# Patient Record
Sex: Female | Born: 1940
Health system: Southern US, Community
[De-identification: ages and names within clinical notes are randomized; demographics above are authoritative.]

## PROBLEM LIST (undated history)

## (undated) DIAGNOSIS — J449 Chronic obstructive pulmonary disease, unspecified: Secondary | ICD-10-CM

## (undated) DIAGNOSIS — C801 Malignant (primary) neoplasm, unspecified: Secondary | ICD-10-CM

## (undated) DIAGNOSIS — R0602 Shortness of breath: Secondary | ICD-10-CM

## (undated) DIAGNOSIS — D649 Anemia, unspecified: Secondary | ICD-10-CM

## (undated) DIAGNOSIS — K219 Gastro-esophageal reflux disease without esophagitis: Secondary | ICD-10-CM

## (undated) DIAGNOSIS — C187 Malignant neoplasm of sigmoid colon: Secondary | ICD-10-CM

## (undated) DIAGNOSIS — E871 Hypo-osmolality and hyponatremia: Secondary | ICD-10-CM

## (undated) DIAGNOSIS — G4730 Sleep apnea, unspecified: Secondary | ICD-10-CM

## (undated) DIAGNOSIS — E039 Hypothyroidism, unspecified: Secondary | ICD-10-CM

## (undated) DIAGNOSIS — C3481 Malignant neoplasm of overlapping sites of right bronchus and lung: Secondary | ICD-10-CM

## (undated) HISTORY — DX: Anemia, unspecified: D64.9

## (undated) HISTORY — DX: Malignant neoplasm of sigmoid colon: C18.7

## (undated) HISTORY — PX: COLON SURGERY: SHX602

## (undated) HISTORY — DX: Hypo-osmolality and hyponatremia: E87.1

## (undated) HISTORY — DX: Malignant neoplasm of overlapping sites of right bronchus and lung: C34.81

## (undated) MED FILL — Etoposide Inj 1 GM/50ML (20 MG/ML): INTRAVENOUS | Qty: 8 | Status: AC

## (undated) MED FILL — Calcium Gluconate Inj 10%: INTRAVENOUS | Qty: 20 | Status: AC

## (undated) MED FILL — Dexamethasone Sodium Phosphate Inj 100 MG/10ML: INTRAMUSCULAR | Qty: 1 | Status: AC

## (undated) MED FILL — Famotidine in NaCl 0.9% IV Soln 20 MG/50ML: INTRAVENOUS | Qty: 100 | Status: AC

## (undated) MED FILL — Nivolumab IV Soln 100 MG/10ML: INTRAVENOUS | Qty: 24 | Status: AC

---

## 2006-08-26 ENCOUNTER — Other Ambulatory Visit: Admission: RE | Admit: 2006-08-26 | Discharge: 2006-08-26 | Payer: Self-pay | Admitting: *Deleted

## 2008-03-25 HISTORY — PX: PORTACATH PLACEMENT: SHX2246

## 2009-01-23 DIAGNOSIS — C801 Malignant (primary) neoplasm, unspecified: Secondary | ICD-10-CM

## 2009-01-23 HISTORY — DX: Malignant (primary) neoplasm, unspecified: C80.1

## 2010-12-03 ENCOUNTER — Telehealth: Payer: Self-pay

## 2010-12-03 DIAGNOSIS — K639 Disease of intestine, unspecified: Secondary | ICD-10-CM

## 2010-12-03 NOTE — Telephone Encounter (Signed)
Left message on machine to call back   Need pharmacy

## 2010-12-03 NOTE — Telephone Encounter (Signed)
Pt scheduled for EUS need to instruct pt and review meds

## 2010-12-04 NOTE — Telephone Encounter (Signed)
I put it on your desk

## 2010-12-04 NOTE — Telephone Encounter (Signed)
Pt was scheduled for 01/10/11 and previsit on 01/03/11. Letter sent

## 2010-12-04 NOTE — Telephone Encounter (Signed)
Pt was called and she states she was told that the EUS was suppose to be done in a month or two.  Do you want me to reschedule?

## 2010-12-04 NOTE — Telephone Encounter (Signed)
Just looked.  Yes, he wanted it later in October (not sure why but that is ok).  It is back in my out basket.  thanks

## 2010-12-04 NOTE — Telephone Encounter (Signed)
Can I see the paperwork again, I may have missed that.

## 2010-12-06 ENCOUNTER — Encounter: Payer: Self-pay | Admitting: Gastroenterology

## 2010-12-06 ENCOUNTER — Ambulatory Visit (HOSPITAL_COMMUNITY): Admission: RE | Admit: 2010-12-06 | Payer: Medicare Other | Source: Ambulatory Visit | Admitting: Gastroenterology

## 2011-01-03 ENCOUNTER — Telehealth: Payer: Self-pay | Admitting: *Deleted

## 2011-01-03 NOTE — Telephone Encounter (Signed)
Pt No show for Previsit.  Called pt x 2; no answer.  Left message for pt to call back to reschedule PV with Gabrielle Hudson CMA. Ezra Sites

## 2011-01-04 NOTE — Telephone Encounter (Signed)
Left message on machine to call back  

## 2011-01-09 NOTE — Telephone Encounter (Signed)
pt appt for colon tomorrow 01/10/11  Left message on machine to call back pt no showed previsit appt.

## 2011-01-10 ENCOUNTER — Encounter: Payer: Self-pay | Admitting: Gastroenterology

## 2011-01-10 ENCOUNTER — Ambulatory Visit (HOSPITAL_COMMUNITY): Admission: RE | Admit: 2011-01-10 | Payer: Medicare Other | Source: Ambulatory Visit | Admitting: Gastroenterology

## 2011-01-10 ENCOUNTER — Telehealth: Payer: Self-pay | Admitting: Gastroenterology

## 2011-01-10 NOTE — Telephone Encounter (Signed)
Gabrielle Hudson,  She did not show for todays EUS. Can you please let the referring provider know ([facility]). Also see if she wants to reschedule for future date  thanks

## 2011-01-10 NOTE — Telephone Encounter (Signed)
Left message on machine to call back  

## 2011-01-16 NOTE — Telephone Encounter (Signed)
Left message on machine to call back  Unable to reach pt several messages left Dr Jennye Boroughs is aware.

## 2011-03-25 ENCOUNTER — Inpatient Hospital Stay (HOSPITAL_COMMUNITY): Payer: Medicare Other

## 2011-03-25 ENCOUNTER — Other Ambulatory Visit: Payer: Self-pay

## 2011-03-25 ENCOUNTER — Inpatient Hospital Stay (HOSPITAL_COMMUNITY)
Admission: AD | Admit: 2011-03-25 | Discharge: 2011-04-01 | DRG: 682 | Disposition: A | Discharge status: Home / Self Care | Payer: Medicare Other | Source: Other Acute Inpatient Hospital | Attending: Internal Medicine | Admitting: Internal Medicine

## 2011-03-25 ENCOUNTER — Encounter (HOSPITAL_COMMUNITY): Payer: Self-pay | Admitting: Infectious Diseases

## 2011-03-25 DIAGNOSIS — E039 Hypothyroidism, unspecified: Secondary | ICD-10-CM | POA: Diagnosis present

## 2011-03-25 DIAGNOSIS — Z85038 Personal history of other malignant neoplasm of large intestine: Secondary | ICD-10-CM

## 2011-03-25 DIAGNOSIS — I129 Hypertensive chronic kidney disease with stage 1 through stage 4 chronic kidney disease, or unspecified chronic kidney disease: Secondary | ICD-10-CM | POA: Diagnosis present

## 2011-03-25 DIAGNOSIS — J811 Chronic pulmonary edema: Secondary | ICD-10-CM | POA: Diagnosis present

## 2011-03-25 DIAGNOSIS — I4729 Other ventricular tachycardia: Secondary | ICD-10-CM | POA: Diagnosis present

## 2011-03-25 DIAGNOSIS — R7402 Elevation of levels of lactic acid dehydrogenase (LDH): Secondary | ICD-10-CM | POA: Diagnosis present

## 2011-03-25 DIAGNOSIS — N179 Acute kidney failure, unspecified: Secondary | ICD-10-CM | POA: Diagnosis present

## 2011-03-25 DIAGNOSIS — E872 Acidosis, unspecified: Secondary | ICD-10-CM | POA: Diagnosis present

## 2011-03-25 DIAGNOSIS — D72829 Elevated white blood cell count, unspecified: Secondary | ICD-10-CM | POA: Diagnosis present

## 2011-03-25 DIAGNOSIS — J189 Pneumonia, unspecified organism: Secondary | ICD-10-CM | POA: Diagnosis present

## 2011-03-25 DIAGNOSIS — D649 Anemia, unspecified: Secondary | ICD-10-CM | POA: Diagnosis present

## 2011-03-25 DIAGNOSIS — Z23 Encounter for immunization: Secondary | ICD-10-CM

## 2011-03-25 DIAGNOSIS — I472 Ventricular tachycardia, unspecified: Secondary | ICD-10-CM | POA: Diagnosis not present

## 2011-03-25 DIAGNOSIS — R918 Other nonspecific abnormal finding of lung field: Secondary | ICD-10-CM | POA: Diagnosis present

## 2011-03-25 DIAGNOSIS — J9620 Acute and chronic respiratory failure, unspecified whether with hypoxia or hypercapnia: Secondary | ICD-10-CM | POA: Diagnosis not present

## 2011-03-25 DIAGNOSIS — Z85118 Personal history of other malignant neoplasm of bronchus and lung: Secondary | ICD-10-CM

## 2011-03-25 DIAGNOSIS — J441 Chronic obstructive pulmonary disease with (acute) exacerbation: Secondary | ICD-10-CM | POA: Diagnosis present

## 2011-03-25 DIAGNOSIS — G4733 Obstructive sleep apnea (adult) (pediatric): Secondary | ICD-10-CM | POA: Diagnosis present

## 2011-03-25 DIAGNOSIS — R7401 Elevation of levels of liver transaminase levels: Secondary | ICD-10-CM | POA: Diagnosis present

## 2011-03-25 DIAGNOSIS — N183 Chronic kidney disease, stage 3 unspecified: Secondary | ICD-10-CM | POA: Diagnosis present

## 2011-03-25 DIAGNOSIS — I214 Non-ST elevation (NSTEMI) myocardial infarction: Secondary | ICD-10-CM | POA: Diagnosis present

## 2011-03-25 HISTORY — DX: Gastro-esophageal reflux disease without esophagitis: K21.9

## 2011-03-25 HISTORY — DX: Chronic obstructive pulmonary disease, unspecified: J44.9

## 2011-03-25 HISTORY — DX: Hypothyroidism, unspecified: E03.9

## 2011-03-25 HISTORY — DX: Shortness of breath: R06.02

## 2011-03-25 HISTORY — DX: Sleep apnea, unspecified: G47.30

## 2011-03-25 HISTORY — DX: Malignant (primary) neoplasm, unspecified: C80.1

## 2011-03-25 LAB — GLUCOSE, CAPILLARY: Glucose-Capillary: 147 mg/dL — ABNORMAL HIGH (ref 70–99)

## 2011-03-25 LAB — CBC
HCT: 29.9 % — ABNORMAL LOW (ref 36.0–46.0)
Hemoglobin: 9.6 g/dL — ABNORMAL LOW (ref 12.0–15.0)
MCH: 27.4 pg (ref 26.0–34.0)
MCHC: 32.1 g/dL (ref 30.0–36.0)
MCV: 85.2 fL (ref 78.0–100.0)
Platelets: 145 10*3/uL — ABNORMAL LOW (ref 150–400)
RBC: 3.51 MIL/uL — ABNORMAL LOW (ref 3.87–5.11)
RDW: 15.8 % — ABNORMAL HIGH (ref 11.5–15.5)
WBC: 11.2 10*3/uL — ABNORMAL HIGH (ref 4.0–10.5)

## 2011-03-25 LAB — URINALYSIS, ROUTINE W REFLEX MICROSCOPIC
Bilirubin Urine: NEGATIVE
Glucose, UA: NEGATIVE mg/dL
Ketones, ur: NEGATIVE mg/dL
Leukocytes, UA: NEGATIVE
Nitrite: NEGATIVE
Protein, ur: NEGATIVE mg/dL
Specific Gravity, Urine: 1.009 (ref 1.005–1.030)
Urobilinogen, UA: 0.2 mg/dL (ref 0.0–1.0)
pH: 5 (ref 5.0–8.0)

## 2011-03-25 LAB — COMPREHENSIVE METABOLIC PANEL
ALT: 54 U/L — ABNORMAL HIGH (ref 0–35)
AST: 64 U/L — ABNORMAL HIGH (ref 0–37)
Albumin: 2.1 g/dL — ABNORMAL LOW (ref 3.5–5.2)
Alkaline Phosphatase: 62 U/L (ref 39–117)
BUN: 112 mg/dL — ABNORMAL HIGH (ref 6–23)
CO2: 16 mEq/L — ABNORMAL LOW (ref 19–32)
Calcium: 7.3 mg/dL — ABNORMAL LOW (ref 8.4–10.5)
Chloride: 104 mEq/L (ref 96–112)
Creatinine, Ser: 6.49 mg/dL — ABNORMAL HIGH (ref 0.50–1.10)
GFR calc Af Amer: 7 mL/min — ABNORMAL LOW (ref >90)
GFR calc non Af Amer: 6 mL/min — ABNORMAL LOW (ref >90)
Glucose, Bld: 149 mg/dL — ABNORMAL HIGH (ref 70–99)
Potassium: 4.7 mEq/L (ref 3.5–5.1)
Sodium: 135 mEq/L (ref 135–145)
Total Bilirubin: 0.2 mg/dL — ABNORMAL LOW (ref 0.3–1.2)
Total Protein: 5.2 g/dL — ABNORMAL LOW (ref 6.0–8.3)

## 2011-03-25 LAB — URINE MICROSCOPIC-ADD ON

## 2011-03-25 LAB — POCT I-STAT 3, ART BLOOD GAS (G3+)
Acid-base deficit: 11 mmol/L — ABNORMAL HIGH (ref 0.0–2.0)
Bicarbonate: 13.9 mEq/L — ABNORMAL LOW (ref 20.0–24.0)
O2 Saturation: 96 %
Patient temperature: 37
TCO2: 15 mmol/L (ref 0–100)
pCO2 arterial: 26.8 mmHg — ABNORMAL LOW (ref 35.0–45.0)
pH, Arterial: 7.323 — ABNORMAL LOW (ref 7.350–7.400)
pO2, Arterial: 88 mmHg (ref 80.0–100.0)

## 2011-03-25 LAB — DIFFERENTIAL
Basophils Absolute: 0 10*3/uL (ref 0.0–0.1)
Basophils Relative: 0 % (ref 0–1)
Eosinophils Absolute: 0 10*3/uL (ref 0.0–0.7)
Eosinophils Relative: 0 % (ref 0–5)
Lymphocytes Relative: 5 % — ABNORMAL LOW (ref 12–46)
Lymphs Abs: 0.6 10*3/uL — ABNORMAL LOW (ref 0.7–4.0)
Monocytes Absolute: 0.4 10*3/uL (ref 0.1–1.0)
Monocytes Relative: 4 % (ref 3–12)
Neutro Abs: 10.2 10*3/uL — ABNORMAL HIGH (ref 1.7–7.7)
Neutrophils Relative %: 91 % — ABNORMAL HIGH (ref 43–77)

## 2011-03-25 LAB — MAGNESIUM: Magnesium: 2.3 mg/dL (ref 1.5–2.5)

## 2011-03-25 LAB — MRSA PCR SCREENING: MRSA by PCR: NEGATIVE

## 2011-03-25 LAB — PRO B NATRIURETIC PEPTIDE: Pro B Natriuretic peptide (BNP): 20053 pg/mL — ABNORMAL HIGH (ref 0–125)

## 2011-03-25 LAB — PHOSPHORUS: Phosphorus: 5.4 mg/dL — ABNORMAL HIGH (ref 2.3–4.6)

## 2011-03-25 LAB — PROTIME-INR
INR: 1.23 (ref 0.00–1.49)
Prothrombin Time: 15.8 seconds — ABNORMAL HIGH (ref 11.6–15.2)

## 2011-03-25 LAB — AMMONIA: Ammonia: <10 umol/L — ABNORMAL LOW (ref 11–60)

## 2011-03-25 LAB — APTT: aPTT: 31 seconds (ref 24–37)

## 2011-03-25 MED ORDER — SODIUM CHLORIDE 0.9 % IJ SOLN: 3.0000 mL | INTRAMUSCULAR | Status: DC | PRN

## 2011-03-25 MED ORDER — ALBUTEROL SULFATE (5 MG/ML) 0.5% IN NEBU
2.5000 mg | INHALATION_SOLUTION | Freq: Four times a day (QID) | RESPIRATORY_TRACT | Status: DC
  Administered 2011-03-25 – 2011-03-26 (×4): 2.5 mg via RESPIRATORY_TRACT
  Filled 2011-03-25 (×3): qty 0.5

## 2011-03-25 MED ORDER — DEXAMETHASONE SODIUM PHOSPHATE 4 MG/ML IJ SOLN
4.0000 mg | INTRAMUSCULAR | Status: AC
  Administered 2011-03-25: 4 mg via INTRAVENOUS
  Filled 2011-03-25: qty 1

## 2011-03-25 MED ORDER — PANTOPRAZOLE SODIUM 40 MG PO TBEC
40.0000 mg | DELAYED_RELEASE_TABLET | Freq: Every day | ORAL | Status: DC
  Administered 2011-03-25 – 2011-03-29 (×5): 40 mg via ORAL
  Filled 2011-03-25 (×5): qty 1

## 2011-03-25 MED ORDER — VANCOMYCIN HCL IN DEXTROSE 1-5 GM/200ML-% IV SOLN
1000.0000 mg | INTRAVENOUS | Status: DC
  Administered 2011-03-25: 1000 mg via INTRAVENOUS
  Filled 2011-03-25 (×2): qty 200

## 2011-03-25 MED ORDER — ONDANSETRON HCL 4 MG PO TABS: 4.0000 mg | ORAL_TABLET | Freq: Four times a day (QID) | ORAL | Status: DC | PRN

## 2011-03-25 MED ORDER — SODIUM CHLORIDE 0.9 % IV SOLN
250.0000 mL | INTRAVENOUS | Status: DC | PRN
  Administered 2011-03-31: 250 mL via INTRAVENOUS

## 2011-03-25 MED ORDER — LEVOTHYROXINE SODIUM 100 MCG PO TABS
100.0000 ug | ORAL_TABLET | Freq: Every day | ORAL | Status: DC
  Administered 2011-03-26 – 2011-04-01 (×6): 100 ug via ORAL
  Filled 2011-03-25 (×10): qty 1

## 2011-03-25 MED ORDER — IPRATROPIUM BROMIDE 0.02 % IN SOLN
0.5000 mg | Freq: Four times a day (QID) | RESPIRATORY_TRACT | Status: DC
  Administered 2011-03-25 – 2011-03-26 (×4): 0.5 mg via RESPIRATORY_TRACT
  Filled 2011-03-25 (×4): qty 2.5

## 2011-03-25 MED ORDER — CALCIUM CARBONATE-VITAMIN D 250-125 MG-UNIT PO TABS: 1.0000 | ORAL_TABLET | Freq: Three times a day (TID) | ORAL | Status: DC

## 2011-03-25 MED ORDER — GUAIFENESIN ER 600 MG PO TB12
600.0000 mg | ORAL_TABLET | Freq: Two times a day (BID) | ORAL | Status: DC
  Administered 2011-03-25 – 2011-04-01 (×14): 600 mg via ORAL
  Filled 2011-03-25 (×17): qty 1

## 2011-03-25 MED ORDER — SODIUM CHLORIDE 0.9 % IJ SOLN
3.0000 mL | Freq: Two times a day (BID) | INTRAMUSCULAR | Status: DC
  Administered 2011-03-25: 10 mL via INTRAVENOUS
  Administered 2011-03-26 – 2011-03-31 (×10): 3 mL via INTRAVENOUS

## 2011-03-25 MED ORDER — SALINE SPRAY 0.65 % NA SOLN
1.0000 | NASAL | Status: DC | PRN
  Administered 2011-03-26 (×2): 1 via NASAL
  Filled 2011-03-25 (×2): qty 44

## 2011-03-25 MED ORDER — SODIUM CHLORIDE 0.9 % IV SOLN
1.0000 g | Freq: Once | INTRAVENOUS | Status: AC
  Administered 2011-03-25: 1 g via INTRAVENOUS
  Filled 2011-03-25: qty 10

## 2011-03-25 MED ORDER — LEVOTHYROXINE SODIUM 175 MCG PO TABS: 175.0000 ug | ORAL_TABLET | Freq: Every day | ORAL | Status: DC

## 2011-03-25 MED ORDER — DILTIAZEM HCL 30 MG PO TABS
30.0000 mg | ORAL_TABLET | Freq: Three times a day (TID) | ORAL | Status: DC
  Administered 2011-03-25 – 2011-04-01 (×21): 30 mg via ORAL
  Filled 2011-03-25 (×27): qty 1

## 2011-03-25 MED ORDER — ALBUTEROL SULFATE (5 MG/ML) 0.5% IN NEBU
2.5000 mg | INHALATION_SOLUTION | RESPIRATORY_TRACT | Status: DC | PRN
  Administered 2011-03-27 (×2): 2.5 mg via RESPIRATORY_TRACT
  Filled 2011-03-25 (×3): qty 0.5

## 2011-03-25 MED ORDER — ONDANSETRON HCL 4 MG/2ML IJ SOLN: 4.0000 mg | Freq: Four times a day (QID) | INTRAMUSCULAR | Status: DC | PRN

## 2011-03-25 MED ORDER — CALCIUM CARBONATE-VITAMIN D 500-200 MG-UNIT PO TABS
1.0000 | ORAL_TABLET | Freq: Three times a day (TID) | ORAL | Status: DC
  Administered 2011-03-25 – 2011-04-01 (×20): 1 via ORAL
  Filled 2011-03-25 (×25): qty 1

## 2011-03-25 MED ORDER — DEXAMETHASONE SODIUM PHOSPHATE 4 MG/ML IJ SOLN
4.0000 mg | Freq: Four times a day (QID) | INTRAMUSCULAR | Status: DC
  Administered 2011-03-26 – 2011-03-28 (×11): 4 mg via INTRAVENOUS
  Filled 2011-03-25 (×18): qty 1

## 2011-03-25 MED ORDER — ACETAMINOPHEN 325 MG PO TABS: 650.0000 mg | ORAL_TABLET | Freq: Four times a day (QID) | ORAL | Status: DC | PRN

## 2011-03-25 MED ORDER — ENOXAPARIN SODIUM 30 MG/0.3ML ~~LOC~~ SOLN
30.0000 mg | SUBCUTANEOUS | Status: DC
Start: 2011-03-25 — End: 2011-03-27
  Administered 2011-03-25 – 2011-03-26 (×2): 30 mg via SUBCUTANEOUS
  Filled 2011-03-25 (×4): qty 0.3

## 2011-03-25 MED ORDER — FUROSEMIDE 10 MG/ML IJ SOLN
60.0000 mg | Freq: Once | INTRAMUSCULAR | Status: AC
  Administered 2011-03-25: 60 mg via INTRAVENOUS
  Filled 2011-03-25: qty 6

## 2011-03-25 MED ORDER — ASPIRIN 325 MG PO TABS
325.0000 mg | ORAL_TABLET | Freq: Every day | ORAL | Status: DC
  Administered 2011-03-25 – 2011-04-01 (×8): 325 mg via ORAL
  Filled 2011-03-25 (×9): qty 1

## 2011-03-25 MED ORDER — ACETAMINOPHEN 650 MG RE SUPP: 650.0000 mg | Freq: Four times a day (QID) | RECTAL | Status: DC | PRN

## 2011-03-25 MED ORDER — SALINE NASAL SPRAY 0.65 % NA SOLN: 1.0000 | NASAL | Status: DC | PRN

## 2011-03-25 MED ORDER — PIPERACILLIN-TAZOBACTAM IN DEX 2-0.25 GM/50ML IV SOLN
2.2500 g | Freq: Three times a day (TID) | INTRAVENOUS | Status: DC
  Administered 2011-03-25 – 2011-03-27 (×6): 2.25 g via INTRAVENOUS
  Filled 2011-03-25 (×9): qty 50

## 2011-03-25 NOTE — Progress Notes (Signed)
Patient transported via bed, cardiac monitor and oxygen to CT. Tolerated transport and procedure well. Returned to room and plan to place on BiPAP for patient comfort.

## 2011-03-25 NOTE — Progress Notes (Signed)
ANTIBIOTIC CONSULT NOTE - INITIAL  Pharmacy Consult for vancomycin, Zosyn  Indication: PNA  Allergies  Allergen Reactions  . Nitroglycerin In D5w Other (See Comments)    "flatlines"    Patient Measurements: Height: 5\' 4"  (162.6 cm) IBW/kg (Calculated) : 54.7  Adjusted Body Weight: ~94 kg from [facility] records.  Vital Signs: Temp: 97.6 F (36.4 C) (12/31 1907) Temp src: Oral (12/31 1907) BP: 124/55 mmHg (12/31 2015) Pulse Rate: 88  (12/31 2015) Intake/Output from previous day:   Intake/Output from this shift: Total I/O In: 25 [I.V.:25] Out: 30 [Urine:30]  Labs: From [facility]: WBC 12.6; Hgb 9.5; Hct 27.8; Pltc 155; Scr 6.25  No results found for this basename: WBC:3,HGB:3,PLT:3,LABCREA:3,CREATININE:3 in the last 72 hours CrCl is unknown because no creatinine reading has been taken. No results found for this basename: VANCOTROUGH:2,VANCOPEAK:2,VANCORANDOM:2,GENTTROUGH:2,GENTPEAK:2,GENTRANDOM:2,TOBRATROUGH:2,TOBRAPEAK:2,TOBRARND:2,AMIKACINPEAK:2,AMIKACINTROU:2,AMIKACIN:2, in the last 72 hours   Microbiology: No results found for this or any previous visit (from the past 720 hour(s)).  Medical History: Past Medical History  Diagnosis Date  . Shortness of breath   . Sleep apnea   . Cancer Colon and Lung  . GERD (gastroesophageal reflux disease)   . Hypothyroidism   . COPD (chronic obstructive pulmonary disease) 2-3 LPM    Medications:  Prescriptions prior to admission  Medication Sig Dispense Refill  . budesonide-formoterol (SYMBICORT) 160-4.5 MCG/ACT inhaler Inhale 2 puffs into the lungs 2 (two) times daily.        . Calcium Carbonate-Vitamin D (CALCIUM + D PO) Take 1 tablet by mouth daily.        . IRON PO Take 1 tablet by mouth daily.        . Levothyroxine Sodium (SYNTHROID PO) Take 1 tablet by mouth daily. Pharmacy to call retail pharmacy to verify dose       . sodium chloride (OCEAN) 0.65 % nasal spray Place 1 spray into the nose as needed. For dry  nose       . tiotropium (SPIRIVA) 18 MCG inhalation capsule Place 18 mcg into inhaler and inhale daily.         Assessment: 70 yo female admitted to [facility] with acute respiratory failure, acute renal failure, sepsis, bilateral PNA, and rhabdomyolysis.  Also with possible acute MI, although cards had signed off at time of transfer.  Received cefepime x 6 days and levaquin x6 days at [facility] prior to transfer.  Transferred to [facility] for possible intubation and ARF.  Spoke with MD, wishes to complete 10 day course of antibiotics with vancomycin and Zosyn.  Goal of Therapy:  Vancomycin trough level 15-20 mcg/ml  Plan:  1. Vancomycin 1g IV q48 hrs for now.  Will need dosing based on trough levels, f/u any HD plans. 2. Zosyn 2.25g IV q8 hrs. 3. F/U cultures, watch renal function closely for any improvement.   Brytney Somes C 03/25/2011,8:31 PM

## 2011-03-25 NOTE — Progress Notes (Signed)
Called Ms. Grismore's daughter Edwena Blow at [phone]. Updated her on current plan of care, unit visitation, and where her mother was located. Ms. Duley had informed me that she has a living will instated and is currently with her daughter. Rodney Booze has informed me that she will bring the paperwork for her chart tomorrow morning when she visits.

## 2011-03-25 NOTE — Progress Notes (Signed)
Patient complaining of worsening SOB. BNP assessed >20000. Patient states that she would like the "facemask they used at [facility] to help her breathe and rest." Patient is currently on 2 LPM [place] with sats 96%, RR 18 with some abdominal effort, but she cannot complete a full conversation without stopping for breaks. Donnamarie Poag, NP called and updated with patient's complaint. Orders received to place patient on BiPAP, administer Lasix and assess chest CT. Will continue to monitor and take patient down for her procedure when first available.

## 2011-03-25 NOTE — H&P (Signed)
PCP:   No primary provider on file.   Chief Complaint:  Worsening renal failure  HPI: Ms Quadros is a pleasant 70 year old female who has copd on 2LNC O2 at home,who was admitted to [facility] on 03/20/11 with worsening SOB. She was found to be in acute exacerbation of copd/apparently septic, with wbc 33000. She was also in arf with creatinine 2, which was apparently new. She was hence placed on broad spectrum abx, and at some point was placed on lasix but has had worsening renal failure and persistent SOB. Labs at admission showed elevated cardiac enzymes, prompting cardiology eval. Per report a decho showed hyperdynamic circulation with EF 70-75. She was seen by Dr Hyman Hopes today for the worsening renal failure- creatinine now >6. He recommended transfer to facility that can do dialysis if necessary, hence transfer. Patient says nothing has improved since admission. She is currently on 3LNC. Review of her labs shows decline of wbc to 14000 lately. Patient "just wants to breathe", but she would not want mechanical ventilation if needed. She denies chest pain.  Review of Systems:  The patient denies anorexia, fever, weight loss,, vision loss, decreased hearing, hoarseness, chest pain, syncope, dyspnea on exertion, peripheral edema, balance deficits, hemoptysis, abdominal pain, melena, hematochezia, severe indigestion/heartburn, hematuria, incontinence, genital sores, muscle weakness, suspicious skin lesions, transient blindness, difficulty walking, depression, unusual weight change, abnormal bleeding, enlarged lymph nodes, angioedema, and breast masses.  Past Medical History: Past Medical History  Diagnosis Date  . Shortness of breath   . Sleep apnea   . Cancer Colon and Lung  . GERD (gastroesophageal reflux disease)   . Hypothyroidism   . COPD (chronic obstructive pulmonary disease) 2-3 LPM   No past surgical history on file.  Medications: Prior to Admission medications   Medication Sig Start  Date End Date Taking? Authorizing Provider  budesonide-formoterol (SYMBICORT) 160-4.5 MCG/ACT inhaler Inhale 2 puffs into the lungs 2 (two) times daily.     Yes Historical Provider, MD  Calcium Carbonate-Vitamin D (CALCIUM + D PO) Take 1 tablet by mouth daily.     Yes Historical Provider, MD  IRON PO Take 1 tablet by mouth daily.     Yes Historical Provider, MD  Levothyroxine Sodium (SYNTHROID PO) Take 1 tablet by mouth daily. Pharmacy to call retail pharmacy to verify dose    Yes Historical Provider, MD  sodium chloride (OCEAN) 0.65 % nasal spray Place 1 spray into the nose as needed. For dry nose    Yes Historical Provider, MD  tiotropium (SPIRIVA) 18 MCG inhalation capsule Place 18 mcg into inhaler and inhale daily.     Yes Historical Provider, MD    Allergies:   Allergies  Allergen Reactions  . Nitroglycerin In D5w Other (See Comments)    "flatlines"    Social History: Quit smoking years ago. Lives alone.   Family History: Father had copd.  Physical Exam: Filed Vitals:   03/25/11 1930 03/25/11 1945 03/25/11 2000 03/25/11 2015  BP: 133/57 130/61 131/59 124/55  Pulse: 89 94 91 88  Temp:      TempSrc:      Resp: 16 20 17 15   Height:   5\' 4"  (1.626 m)   SpO2: 98% 98% 98% 98%   General- in moderate respiratory distress. Struggles to complete sentences. HEENT- no jvd. Dry oral mucosa. RS- reduced air entry bilaterally, scant wheezing, scattered rhonchi. CVS- S1S2. No murmurs. RRR. Abdomen- soft, non tender, no palpable organomegaly. +BS CNS- grossly intact. Extremities- no pedal  edema.   Labs on Admission:  No results found for this basename: NA:2,K:2,CL:2,CO2:2,GLUCOSE:2,BUN:2,CREATININE:2,CALCIUM:2,MG:2,PHOS:2 in the last 72 hours No results found for this basename: AST:2,ALT:2,ALKPHOS:2,BILITOT:2,PROT:2,ALBUMIN:2 in the last 72 hours No results found for this basename: LIPASE:2,AMYLASE:2 in the last 72 hours No results found for this basename:  WBC:2,NEUTROABS:2,HGB:2,HCT:2,MCV:2,PLT:2 in the last 72 hours No results found for this basename: CKTOTAL:3,CKMB:3,CKMBINDEX:3,TROPONINI:3 in the last 72 hours No results found for this basename: TSH,T4TOTAL,FREET3,T3FREE,THYROIDAB in the last 72 hours No results found for this basename: VITAMINB12:2,FOLATE:2,FERRITIN:2,TIBC:2,IRON:2,RETICCTPCT:2 in the last 72 hours  Radiological Exams on Admission: No results found.  Assessment 70 year old female with multiple comobids, here for worsening renal failure in setting of tenuos respiratory condition. LRTI seems to be under control. Seems like she came in with some prerenal picture which worsened during hospital stay at [facility]. Her enzymes were positive, although her EF was reportedly hyperdynamic ? Demand ischemia/arf. TSH also apparently low, patient on synthroid. Patient would not want vent support.  Plan .AKI (acute kidney injury)- defer work up and management to renal. Patient seems open to HD if necessary. Discussed with Dr Arrie Aran, who will graciously see patient later tonight. .Decompensated COPD with exacerbation (chronic obstructive pulmonary disease)- this seems to be playing major role in SOB, in addition to ?pulomanry edema. Will place on systemic steroids/O2/Bronchodilators, abx(vanc/zosyn) to complete 4 more days. obtain ABG.  .NSTEMI (non-ST elevated myocardial infarction)- start ASA, place on calcium channel blocker, avoid beta blockers for now, given tenuous respiratory state. Didn/t start statin/acei in view of concern for rhabdo/worsening arf .Hypocalcemia- replenish. .Hypothyroidism- ?low tsh, reduced synthroid dose. Repeat tsh.  Condition very closely guarded.  Time spent on this patient including examination and decision-making process: 70 minutes.  Ahlana Slaydon [phone] 03/25/2011, 8:27 PM

## 2011-03-25 NOTE — Significant Event (Signed)
Received patient from [place] via [place]. Pt arrived with intact foley catheter placed yesterday at the hospital per report, and porta-cath on Left chest with NS infusing at 20cc/hour. Skin intact, noted nonblanchable areas on bilateral heels. Pt arrived on 4L [place]. Placed on monitors. Pt denied pain, endorsed SOB. Interventions followed. Pt is being monitored. Edge Mauger, Charity fundraiser.

## 2011-03-26 ENCOUNTER — Encounter (HOSPITAL_COMMUNITY): Payer: Self-pay | Admitting: Infectious Diseases

## 2011-03-26 LAB — VITAMIN B12: Vitamin B-12: 668 pg/mL (ref 211–911)

## 2011-03-26 LAB — CARDIAC PANEL(CRET KIN+CKTOT+MB+TROPI)
CK, MB: 14 ng/mL (ref 0.3–4.0)
CK, MB: 12.5 ng/mL (ref 0.3–4.0)
CK, MB: 11.1 ng/mL (ref 0.3–4.0)
CK, MB: 10.1 ng/mL (ref 0.3–4.0)
Relative Index: 11.3 — ABNORMAL HIGH (ref 0.0–2.5)
Relative Index: 11.9 — ABNORMAL HIGH (ref 0.0–2.5)
Relative Index: INVALID (ref 0.0–2.5)
Relative Index: INVALID (ref 0.0–2.5)
Total CK: 124 U/L (ref 7–177)
Total CK: 105 U/L (ref 7–177)
Total CK: 85 U/L (ref 7–177)
Total CK: 74 U/L (ref 7–177)
Troponin I: <0.3 ng/mL (ref <0.30)
Troponin I: <0.3 ng/mL (ref <0.30)
Troponin I: <0.3 ng/mL (ref <0.30)
Troponin I: <0.3 ng/mL (ref <0.30)

## 2011-03-26 LAB — CBC
HCT: 29.1 % — ABNORMAL LOW (ref 36.0–46.0)
Hemoglobin: 9.4 g/dL — ABNORMAL LOW (ref 12.0–15.0)
MCH: 27.2 pg (ref 26.0–34.0)
MCHC: 32.3 g/dL (ref 30.0–36.0)
MCV: 84.3 fL (ref 78.0–100.0)
Platelets: 146 10*3/uL — ABNORMAL LOW (ref 150–400)
RBC: 3.45 MIL/uL — ABNORMAL LOW (ref 3.87–5.11)
RDW: 15.7 % — ABNORMAL HIGH (ref 11.5–15.5)
WBC: 10.4 10*3/uL (ref 4.0–10.5)

## 2011-03-26 LAB — URINE CULTURE
Colony Count: NO GROWTH
Culture  Setup Time: 201212312208
Culture: NO GROWTH

## 2011-03-26 LAB — URINALYSIS, ROUTINE W REFLEX MICROSCOPIC
Bilirubin Urine: NEGATIVE
Glucose, UA: NEGATIVE mg/dL
Ketones, ur: NEGATIVE mg/dL
Leukocytes, UA: NEGATIVE
Nitrite: NEGATIVE
Protein, ur: 30 mg/dL — AB
Specific Gravity, Urine: 1.01 (ref 1.005–1.030)
Urobilinogen, UA: 0.2 mg/dL (ref 0.0–1.0)
pH: 5 (ref 5.0–8.0)

## 2011-03-26 LAB — GLUCOSE, CAPILLARY
Glucose-Capillary: 112 mg/dL — ABNORMAL HIGH (ref 70–99)
Glucose-Capillary: 136 mg/dL — ABNORMAL HIGH (ref 70–99)
Glucose-Capillary: 138 mg/dL — ABNORMAL HIGH (ref 70–99)
Glucose-Capillary: 151 mg/dL — ABNORMAL HIGH (ref 70–99)
Glucose-Capillary: 153 mg/dL — ABNORMAL HIGH (ref 70–99)
Glucose-Capillary: 99 mg/dL (ref 70–99)

## 2011-03-26 LAB — URINE MICROSCOPIC-ADD ON

## 2011-03-26 LAB — COMPREHENSIVE METABOLIC PANEL
ALT: 60 U/L — ABNORMAL HIGH (ref 0–35)
AST: 61 U/L — ABNORMAL HIGH (ref 0–37)
Albumin: 2 g/dL — ABNORMAL LOW (ref 3.5–5.2)
Alkaline Phosphatase: 61 U/L (ref 39–117)
BUN: 118 mg/dL — ABNORMAL HIGH (ref 6–23)
CO2: 16 mEq/L — ABNORMAL LOW (ref 19–32)
Calcium: 7.6 mg/dL — ABNORMAL LOW (ref 8.4–10.5)
Chloride: 106 mEq/L (ref 96–112)
Creatinine, Ser: 6.54 mg/dL — ABNORMAL HIGH (ref 0.50–1.10)
GFR calc Af Amer: 7 mL/min — ABNORMAL LOW (ref >90)
GFR calc non Af Amer: 6 mL/min — ABNORMAL LOW (ref >90)
Glucose, Bld: 109 mg/dL — ABNORMAL HIGH (ref 70–99)
Potassium: 4.7 mEq/L (ref 3.5–5.1)
Sodium: 139 mEq/L (ref 135–145)
Total Bilirubin: 0.2 mg/dL — ABNORMAL LOW (ref 0.3–1.2)
Total Protein: 5.1 g/dL — ABNORMAL LOW (ref 6.0–8.3)

## 2011-03-26 LAB — CREATININE, URINE, RANDOM: Creatinine, Urine: 37.73 mg/dL

## 2011-03-26 LAB — APTT: aPTT: 33 seconds (ref 24–37)

## 2011-03-26 LAB — LIPID PANEL
Cholesterol: 136 mg/dL (ref 0–200)
HDL: 41 mg/dL (ref >39)
LDL Cholesterol: 68 mg/dL (ref 0–99)
Total CHOL/HDL Ratio: 3.3 RATIO
Triglycerides: 134 mg/dL (ref <150)
VLDL: 27 mg/dL (ref 0–40)

## 2011-03-26 LAB — PROTIME-INR
INR: 1.3 (ref 0.00–1.49)
Prothrombin Time: 16.4 seconds — ABNORMAL HIGH (ref 11.6–15.2)

## 2011-03-26 LAB — LACTATE DEHYDROGENASE: LDH: 349 U/L — ABNORMAL HIGH (ref 94–250)

## 2011-03-26 LAB — MAGNESIUM: Magnesium: 2.1 mg/dL (ref 1.5–2.5)

## 2011-03-26 LAB — TSH: TSH: 0.018 u[IU]/mL — ABNORMAL LOW (ref 0.350–4.500)

## 2011-03-26 LAB — SODIUM, URINE, RANDOM: Sodium, Ur: 79 mEq/L

## 2011-03-26 MED ORDER — INFLUENZA VIRUS VACC SPLIT PF IM SUSP: 0.5000 mL | INTRAMUSCULAR | Status: DC | PRN

## 2011-03-26 MED ORDER — BIOTENE DRY MOUTH MT LIQD
15.0000 mL | Freq: Two times a day (BID) | OROMUCOSAL | Status: DC
  Administered 2011-03-26 – 2011-04-01 (×12): 15 mL via OROMUCOSAL

## 2011-03-26 MED ORDER — ALBUTEROL SULFATE (5 MG/ML) 0.5% IN NEBU
2.5000 mg | INHALATION_SOLUTION | Freq: Three times a day (TID) | RESPIRATORY_TRACT | Status: DC
  Administered 2011-03-26 – 2011-03-27 (×2): 2.5 mg via RESPIRATORY_TRACT
  Filled 2011-03-26 (×3): qty 0.5

## 2011-03-26 MED ORDER — CHLORHEXIDINE GLUCONATE 0.12 % MT SOLN
15.0000 mL | Freq: Two times a day (BID) | OROMUCOSAL | Status: DC
  Administered 2011-03-26 – 2011-04-01 (×12): 15 mL via OROMUCOSAL
  Filled 2011-03-26 (×15): qty 15

## 2011-03-26 MED ORDER — INSULIN ASPART 100 UNIT/ML ~~LOC~~ SOLN
0.0000 [IU] | Freq: Three times a day (TID) | SUBCUTANEOUS | Status: DC
  Administered 2011-03-27: 3 [IU] via SUBCUTANEOUS
  Administered 2011-03-28 – 2011-03-29 (×5): 1 [IU] via SUBCUTANEOUS
  Administered 2011-03-29 – 2011-03-31 (×5): 2 [IU] via SUBCUTANEOUS
  Administered 2011-04-01: 1 [IU] via SUBCUTANEOUS
  Administered 2011-04-01: 2 [IU] via SUBCUTANEOUS
  Filled 2011-03-26: qty 3

## 2011-03-26 MED ORDER — IPRATROPIUM BROMIDE 0.02 % IN SOLN
0.5000 mg | Freq: Three times a day (TID) | RESPIRATORY_TRACT | Status: DC
  Administered 2011-03-26 – 2011-03-27 (×2): 0.5 mg via RESPIRATORY_TRACT
  Filled 2011-03-26 (×3): qty 2.5

## 2011-03-26 NOTE — Progress Notes (Signed)
Patient ID: LAKITA SAHLIN, female   DOB: [birth year], 71 y.o.   MRN: [id number] Reason for Consult:ARF Referring Physician: Conley Canal, MD  QUINCI GAVIDIA is an 71 y.o. female.  HPI: Pt is a 71 yo WF with PMH sig for COPD, lung and colon cancer who presented to [facility] on 03/20/11 with increasing SOB.  Pt was found to have an elevated WBC and was hypotensive.  We were asked to see the patient yesterday at [facility] for oliguric ARF.  Dr Hyman Hopes evaluated the patient and felt her ARF was likely ischemic ATN given hypotension and bland urine sediment, however her creatinine climbed from 2 on admission to 6 and he recommended tx here to [facility] for further eval and management in case she required dialysis.  She was treated with IVF's for the first 5 days of hospitalization and those were stopped yesterday and started on lasix per Dr. Hyman Hopes.  Trend in Creatinine: Creatinine, Ser  Date/Time Value Range Status  03/26/2011  5:25 AM 6.54* 0.50-1.10 (mg/dL) Final  40/98/1191  4:78 PM 6.49* 0.50-1.10 (mg/dL) Final    PMH:   Past Medical History  Diagnosis Date  . Shortness of breath   . Sleep apnea   . Cancer Colon and Lung  . GERD (gastroesophageal reflux disease)   . Hypothyroidism   . COPD (chronic obstructive pulmonary disease) 2-3 LPM  . Diabetes mellitus newly diagnosed    PSH:   Past Surgical History  Procedure Date  . Portacath placement 2010  . Colon surgery     Allergies:  Allergies  Allergen Reactions  . Nitroglycerin In D5w Other (See Comments)    "flatlines"    Medications:   Prior to Admission medications   Medication Sig Start Date End Date Taking? Authorizing Provider  budesonide-formoterol (SYMBICORT) 160-4.5 MCG/ACT inhaler Inhale 2 puffs into the lungs 2 (two) times daily.     Yes Historical Provider, MD  Calcium Carbonate-Vitamin D (CALCIUM + D PO) Take 1 tablet by mouth daily.     Yes Historical Provider, MD  IRON PO Take 1 tablet by mouth daily.      Yes Historical Provider, MD  levothyroxine (SYNTHROID, LEVOTHROID) 175 MCG tablet Take 175 mcg by mouth daily.     Yes Historical Provider, MD  sodium chloride (OCEAN) 0.65 % nasal spray Place 1 spray into the nose as needed. For dry nose    Yes Historical Provider, MD  tiotropium (SPIRIVA) 18 MCG inhalation capsule Place 18 mcg into inhaler and inhale daily.     Yes Historical Provider, MD    Discontinued Meds:   Medications Discontinued During This Encounter  Medication Reason  . levothyroxine (SYNTHROID, LEVOTHROID) tablet 175 mcg   . calcium-vitamin D (OSCAL WITH D) 250-125 MG-UNIT per tablet 1 tablet Formulary change  . sodium chloride (OCEAN) 0.65 % nasal spray 1 spray Formulary change  . Levothyroxine Sodium (SYNTHROID PO) Entry Error    Social History:  reports that she quit smoking about 3 years ago. Her smoking use included Cigarettes. She does not have any smokeless tobacco history on file. She reports that she does not drink alcohol or use illicit drugs.  Family History:  No family history on file.  Pertinent items are noted in HPI.  Blood pressure 133/54, pulse 88, temperature 97.8 F (36.6 C), temperature source Oral, resp. rate 19, height 5\' 4"  (1.626 m), weight 101 kg (222 lb 10.6 oz), SpO2 98.00%. General appearance: alert, cooperative and mild distress Resp: rhonchi bilaterally  and wheezes bilaterally Cardio: regular rate and rhythm, S1, S2 normal, no murmur, click, rub or gallop GI: soft, non-tender; bowel sounds normal; no masses,  no organomegaly Extremities: edema trace pretib  Labs: Basic Metabolic Panel:  Lab 03/26/11 4098 03/25/11 2040  NA 139 135  K 4.7 4.7  CL 106 104  CO2 16* 16*  GLUCOSE 109* 149*  BUN 118* 112*  CREATININE 6.54* 6.49*  ALB -- --  CALCIUM 7.6* 7.3*  PHOS -- 5.4*   Liver Function Tests:  Lab 03/26/11 0525 03/25/11 2040  AST 61* 64*  ALT 60* 54*  ALKPHOS 61 62  BILITOT 0.2* 0.2*  PROT 5.1* 5.2*  ALBUMIN 2.0* 2.1*   No  results found for this basename: LIPASE:3,AMYLASE:3 in the last 168 hours  Lab 03/25/11 2120  AMMONIA <10*   CBC:  Lab 03/26/11 0525 03/25/11 2040  WBC 10.4 11.2*  NEUTROABS -- 10.2*  HGB 9.4* 9.6*  HCT 29.1* 29.9*  MCV 84.3 85.2  PLT 146* 145*   PT/INR: @labrcntip (inr:5) Cardiac Enzymes:  Lab 03/26/11 0525 03/25/11 2150  CKTOTAL 105 124  CKMB 12.5* 14.0*  CKMBINDEX -- --  TROPONINI <0.30 <0.30   CBG:  Lab 03/26/11 1214 03/26/11 0848 03/26/11 0327 03/26/11 0027 03/25/11 1905  GLUCAP 153* 99 138* 151* 147*    Iron Studies: No results found for this basename: IRON:30,TIBC:30,TRANSFERRIN:30,FERRITIN:30 in the last 168 hours  Xrays/Other Studies: Ct Chest Wo Contrast  03/26/2011  *RADIOLOGY REPORT*  Clinical Data: Severe shortness of breath with chest pain. Question lung mass.  Renal insufficiency.  CT CHEST WITHOUT CONTRAST  Technique:  Multidetector CT imaging of the chest was performed following the standard protocol without IV contrast.  Comparison: Radiographs 03/25/2011.  CT 11/05/2010 [facility] hospital).  Findings: According to prior history, the patient has a history of lung cancer.  There is a left subclavian Port-A-Cath with its tip in the SVC.  There are postsurgical changes status post thyroidectomy.  A high right paratracheal node measuring 9 mm on image 7 has mildly enlarged.  No other discrete lymph nodes are identified.  However, there is increased soft tissue density posteriorly in the right hilum, measuring approximately 2.6 x 2.2 cm on image 21.  Adjacent parenchymal opacity has also slightly progressed.  There is central distortion of the tracheobronchial tree suggesting prior radiation to this area. There is no gross hilar adenopathy allowing for the limitation of no intravenous contrast.  Small bilateral pleural effusions are present.  There are associated dependent opacities at both lung bases.  Diffuse emphysematous changes are again noted.  There is stable  nodularity in the right upper lobe on images 10 and 12.  There is a new ill- defined ground-glass opacity more inferiorly in the right upper lobe on image 25, measuring approximately 2.8 cm in diameter.  As evaluated in the noncontrast state, the visualized upper abdomen appears stable with stable low density lesions in the right hepatic lobe measuring up to 2.5 cm on image 54.  No suspicious osseous lesions are identified.  IMPRESSION:  1.  Compared with the prior CT from 11/05/2010, there is increased right hilar fullness and adjacent parenchymal opacity.  These findings may be secondary to radiation pneumonitis/fibrosis. Recurrent mass cannot be excluded. 2.  New focal ground-glass opacity in the right upper lobe may be inflammatory.  Follow up is warranted to exclude atypical neoplasm. 3.  Small bilateral pleural effusions with associated bibasilar pulmonary opacities, likely atelectasis. 4.  Stable liver lesions.  PET CT  should be considered to assess for hypermetabolic activity if there is clinical concern of recurrent neoplasm.  Original Report Authenticated By: Gerrianne Scale, M.D.   Portable Chest 1 View  03/25/2011  *RADIOLOGY REPORT*  Clinical Data: Short of breath  PORTABLE CHEST - 1 VIEW  Comparison: None.  Findings: Shallow inspiration.  Cardiac enlargement with mild increased pulmonary vascularity.  Hazy infiltrates in the lung bases may represent mild edema.  No focal airspace consolidation. No blunting of costophrenic angles.  No pneumothorax.  Power port type left central venous catheter with tip in the upper SVC. Prominent right hilar and right paratracheal shadows likely represent mass and scarring.  Correlation with clinical history is recommended.  Consider CT for further evaluation if clinically indicated.  IMPRESSION: Congestive changes in the heart lungs with mild interstitial edema. Prominence of right paratracheal and right hilar tissues suggesting mass lesion.  Original Report  Authenticated By: Marlon Pel, M.D.     Assessment/Plan: 1.  ARF- now nonoliguric with marked increase in urine production over the last 24 hours.  Continue to follow and dose with lasix prn.  No indication for HD at this time. 2. SOB- likely decompensated COPD +/- PNA.  Cont with pulm toilet and nebs/inhalers 3. CAP- on zosyn 4. HTN- stable 5. Elevated LFT's ? Due to hypotension.  Cont to follow 6. Anemia- proceed with w/u including SPEP and UPEP   Taccara Bushnell A 03/26/2011, 12:41 PM

## 2011-03-26 NOTE — Plan of Care (Signed)
Problem: Phase I Progression Outcomes Goal: Dyspnea controlled at rest Outcome: Not Progressing Worsening SOB requiring BiPAP/lasix Goal: OOB as tolerated unless otherwise ordered Outcome: Not Applicable Date Met:  03/26/11 Non ambulatory baseline

## 2011-03-26 NOTE — Progress Notes (Addendum)
CRITICAL VALUE ALERT  Critical value received:  CK-MB 14.0  Date of notification:  03/26/2011  Time of notification:  0027  Critical value read back:yes  Nurse who received alert:  Rexene Alberts  MD notified (1st page):   Paged Donnamarie Poag, NP @0030   MD responded: 03/26/2011 @ 0050

## 2011-03-26 NOTE — Progress Notes (Signed)
DAILY PROGRESS NOTE                              GENERAL INTERNAL MEDICINE [facility]  SUBJECTIVE: Complaining about minimal shortness of breath. Has some cough with minimal sputum production she is also wheezing  OBJECTIVE: BP 129/76  Pulse 83  Temp(Src) 97.4 F (36.3 C) (Oral)  Resp 17  Ht 5\' 4"  (1.626 m)  Wt 101 kg (222 lb 10.6 oz)  BMI 38.22 kg/m2  SpO2 98%  Intake/Output Summary (Last 24 hours) at 03/26/11 0815 Last data filed at 03/26/11 0810  Gross per 24 hour  Intake 666.67 ml  Output   1646 ml  Net -979.33 ml                      Weight change:  Physical Exam: General: Alert and awake oriented x3 not in any acute distress. HEENT: anicteric sclera, pupils equal reactive to light and accommodation CVS: S1-S2 heard, no murmur rubs or gallops Chest: Scattered rhonchi. Faint wheezing bilaterally. Abdomen:  normal bowel sounds, soft, nontender, nondistended, no organomegaly Neuro: Cranial nerves II-XII intact, no focal neurological deficits Extremities: no cyanosis, no clubbing or edema noted bilaterally   Lab Results:  [facility] 03/26/11 0525 03/25/11 2040  NA 139 135  K 4.7 4.7  CL 106 104  CO2 16* 16*  GLUCOSE 109* 149*  BUN 118* 112*  CREATININE 6.54* 6.49*  CALCIUM 7.6* 7.3*  MG 2.1 2.3  PHOS -- 5.4*    Basename 03/26/11 0525 03/25/11 2040  AST 61* 64*  ALT 60* 54*  ALKPHOS 61 62  BILITOT 0.2* 0.2*  PROT 5.1* 5.2*  ALBUMIN 2.0* 2.1*   Basename 03/26/11 0525 03/25/11 2040  WBC 10.4 11.2*  NEUTROABS -- 10.2*  HGB 9.4* 9.6*  HCT 29.1* 29.9*  MCV 84.3 85.2  PLT 146* 145*    Basename 03/26/11 0525 03/25/11 2150  CKTOTAL 105 124  CKMB 12.5* 14.0*  CKMBINDEX -- --  TROPONINI <0.30 <0.30   Basename 03/26/11 0525  CHOL 136  HDL 41  LDLCALC 68  TRIG 134  CHOLHDL 3.3  LDLDIRECT --    Basename 03/25/11 2040  TSH 0.018*  T4TOTAL --  T3FREE --  THYROIDAB --   Micro Results: Recent Results (from the past 240 hour(s))  MRSA PCR  SCREENING     Status: Normal   Collection Time   03/25/11  7:15 PM      Component Value Range Status Comment   MRSA by PCR NEGATIVE  NEGATIVE  Final     Studies/Results: Portable Chest 1 View 03/25/2011    IMPRESSION: Congestive changes in the heart lungs with mild interstitial edema. Prominence of right paratracheal and right hilar tissues suggesting mass lesion.     Medications: Scheduled Meds:   . albuterol  2.5 mg Nebulization Q6H  . antiseptic oral rinse  15 mL Mouth Rinse q12n4p  . aspirin  325 mg Oral Daily  . calcium gluconate  1 g Intravenous Once  . calcium-vitamin D  1 tablet Oral TID  . chlorhexidine  15 mL Mouth Rinse BID  . dexamethasone  4 mg Intravenous Q6H  . dexamethasone  4 mg Intravenous NOW  . diltiazem  30 mg Oral Q8H  . enoxaparin  30 mg Subcutaneous Q24H  . furosemide  60 mg Intravenous Once  . guaiFENesin  600 mg Oral BID  . ipratropium  0.5 mg Nebulization Q6H  .  levothyroxine  100 mcg Oral QAC breakfast  . pantoprazole  40 mg Oral Q1200  . piperacillin-tazobactam (ZOSYN)  IV  2.25 g Intravenous Q8H  . sodium chloride  3 mL Intravenous Q12H  . vancomycin  1,000 mg Intravenous Q48H  . DISCONTD: calcium-vitamin D  1 tablet Oral TID  . DISCONTD: levothyroxine  175 mcg Oral QAC breakfast   Continuous Infusions:  PRN Meds:.sodium chloride, acetaminophen, acetaminophen, albuterol, influenza  inactive virus vaccine, ondansetron (ZOFRAN) IV, ondansetron, sodium chloride, sodium chloride, DISCONTD: sodium chloride  ASSESSMENT & PLAN: Active Problems:  AKI (acute kidney injury)  Decompensated COPD with exacerbation (chronic obstructive pulmonary disease)  NSTEMI (non-ST elevated myocardial infarction)  Hypocalcemia  Hypothyroidism  1. Acute renal failure: According toand admitted to prerenal picture worsened during the hospital stay with aggressive diuresis. Creatinine has climbed up on today's 6.4. Patient has, and the hospital to be evaluated for  possible dialysis. Nephrology to see, patient is off of nephrotoxic medications including Lasix.  2. COPD exacerbation: This was initial admission diagnosis. Patient to be continued on her bronchodilators, mucolytics, systemic steroids and IV antibiotics.  3. Pulmonary edema, mild: This is likely noncardiogenic pulmonary edema secondary to fluid overload. It is likely playing a role in patient's shortness of breath. Nephrology to evaluate.  4. Hypothyroidism: Patient on Synthroid. Low TSH. Synthroid dose will be decreased, check TSH in 3 weeks.  5. Elevated troponins: From outside hospital, reported elevated cardiac enzymes. Echocardiogram showed hyperdynamic left ventricle ejection fraction of about 73%. No wall motion abnormality reported. The elevated troponin can be secondary to decreased renal function and decreased troponin clearance. Patient placed on aspirin, no beta blockers because of bronchospasm. So far troponins being negative here.   LOS: 1 day   Gabrielle Hudson A 03/26/2011, 8:15 AM

## 2011-03-27 ENCOUNTER — Inpatient Hospital Stay (HOSPITAL_COMMUNITY): Payer: Medicare Other

## 2011-03-27 DIAGNOSIS — E872 Acidosis, unspecified: Secondary | ICD-10-CM | POA: Diagnosis present

## 2011-03-27 DIAGNOSIS — J9600 Acute respiratory failure, unspecified whether with hypoxia or hypercapnia: Secondary | ICD-10-CM

## 2011-03-27 DIAGNOSIS — Z85118 Personal history of other malignant neoplasm of bronchus and lung: Secondary | ICD-10-CM

## 2011-03-27 DIAGNOSIS — D72829 Elevated white blood cell count, unspecified: Secondary | ICD-10-CM | POA: Diagnosis present

## 2011-03-27 DIAGNOSIS — J449 Chronic obstructive pulmonary disease, unspecified: Secondary | ICD-10-CM

## 2011-03-27 DIAGNOSIS — R7401 Elevation of levels of liver transaminase levels: Secondary | ICD-10-CM | POA: Diagnosis present

## 2011-03-27 DIAGNOSIS — I319 Disease of pericardium, unspecified: Secondary | ICD-10-CM

## 2011-03-27 DIAGNOSIS — R918 Other nonspecific abnormal finding of lung field: Secondary | ICD-10-CM | POA: Diagnosis present

## 2011-03-27 DIAGNOSIS — G4733 Obstructive sleep apnea (adult) (pediatric): Secondary | ICD-10-CM | POA: Diagnosis present

## 2011-03-27 DIAGNOSIS — D649 Anemia, unspecified: Secondary | ICD-10-CM | POA: Diagnosis present

## 2011-03-27 DIAGNOSIS — J189 Pneumonia, unspecified organism: Secondary | ICD-10-CM | POA: Diagnosis present

## 2011-03-27 DIAGNOSIS — I4729 Other ventricular tachycardia: Secondary | ICD-10-CM | POA: Diagnosis present

## 2011-03-27 DIAGNOSIS — I472 Ventricular tachycardia: Secondary | ICD-10-CM | POA: Diagnosis present

## 2011-03-27 LAB — BLOOD GAS, ARTERIAL
Acid-base deficit: 9.1 mmol/L — ABNORMAL HIGH (ref 0.0–2.0)
Bicarbonate: 14.7 mEq/L — ABNORMAL LOW (ref 20.0–24.0)
Drawn by: 31297
FIO2: 0.28 %
O2 Saturation: 95.4 %
Patient temperature: 98.6
TCO2: 15.4 mmol/L (ref 0–100)
pCO2 arterial: 23.6 mmHg — ABNORMAL LOW (ref 35.0–45.0)
pH, Arterial: 7.41 — ABNORMAL HIGH (ref 7.350–7.400)
pO2, Arterial: 74.5 mmHg — ABNORMAL LOW (ref 80.0–100.0)

## 2011-03-27 LAB — SODIUM, URINE, RANDOM: Sodium, Ur: 66 mEq/L

## 2011-03-27 LAB — HEMOGLOBIN A1C
Hgb A1c MFr Bld: 6.4 % — ABNORMAL HIGH (ref <5.7)
Mean Plasma Glucose: 137 mg/dL — ABNORMAL HIGH (ref <117)

## 2011-03-27 LAB — CREATININE, URINE, RANDOM: Creatinine, Urine: 52.04 mg/dL

## 2011-03-27 LAB — HAPTOGLOBIN: Haptoglobin: 289 mg/dL — ABNORMAL HIGH (ref 30–200)

## 2011-03-27 LAB — GLUCOSE, CAPILLARY
Glucose-Capillary: 123 mg/dL — ABNORMAL HIGH (ref 70–99)
Glucose-Capillary: 240 mg/dL — ABNORMAL HIGH (ref 70–99)
Glucose-Capillary: 238 mg/dL — ABNORMAL HIGH (ref 70–99)
Glucose-Capillary: 215 mg/dL — ABNORMAL HIGH (ref 70–99)

## 2011-03-27 LAB — BASIC METABOLIC PANEL
BUN: 129 mg/dL — ABNORMAL HIGH (ref 6–23)
CO2: 16 mEq/L — ABNORMAL LOW (ref 19–32)
Calcium: 8.1 mg/dL — ABNORMAL LOW (ref 8.4–10.5)
Chloride: 104 mEq/L (ref 96–112)
Creatinine, Ser: 6.87 mg/dL — ABNORMAL HIGH (ref 0.50–1.10)
GFR calc Af Amer: 6 mL/min — ABNORMAL LOW (ref >90)
GFR calc non Af Amer: 5 mL/min — ABNORMAL LOW (ref >90)
Glucose, Bld: 97 mg/dL (ref 70–99)
Potassium: 4.5 mEq/L (ref 3.5–5.1)
Sodium: 138 mEq/L (ref 135–145)

## 2011-03-27 LAB — KAPPA/LAMBDA LIGHT CHAINS
Kappa free light chain: 1.68 mg/dL (ref 0.33–1.94)
Kappa, lambda light chain ratio: 0.73 (ref 0.26–1.65)
Lambda free light chains: 2.31 mg/dL (ref 0.57–2.63)

## 2011-03-27 LAB — CBC
HCT: 29.8 % — ABNORMAL LOW (ref 36.0–46.0)
Hemoglobin: 9.8 g/dL — ABNORMAL LOW (ref 12.0–15.0)
MCH: 27.1 pg (ref 26.0–34.0)
MCHC: 32.9 g/dL (ref 30.0–36.0)
MCV: 82.5 fL (ref 78.0–100.0)
Platelets: 150 10*3/uL (ref 150–400)
RBC: 3.61 MIL/uL — ABNORMAL LOW (ref 3.87–5.11)
RDW: 15.5 % (ref 11.5–15.5)
WBC: 9.3 10*3/uL (ref 4.0–10.5)

## 2011-03-27 LAB — FOLATE RBC: RBC Folate: 1205 ng/mL — ABNORMAL HIGH (ref >=366)

## 2011-03-27 MED ORDER — HEPARIN SODIUM (PORCINE) 5000 UNIT/ML IJ SOLN
5000.0000 [IU] | Freq: Three times a day (TID) | INTRAMUSCULAR | Status: DC
  Administered 2011-03-27 – 2011-04-01 (×15): 5000 [IU] via SUBCUTANEOUS
  Filled 2011-03-27 (×17): qty 1

## 2011-03-27 MED ORDER — IPRATROPIUM BROMIDE 0.02 % IN SOLN
0.5000 mg | Freq: Four times a day (QID) | RESPIRATORY_TRACT | Status: DC
  Administered 2011-03-27 – 2011-04-01 (×18): 0.5 mg via RESPIRATORY_TRACT
  Filled 2011-03-27 (×21): qty 2.5

## 2011-03-27 MED ORDER — ALBUTEROL SULFATE (5 MG/ML) 0.5% IN NEBU: 2.5000 mg | INHALATION_SOLUTION | RESPIRATORY_TRACT | Status: DC | PRN

## 2011-03-27 MED ORDER — SODIUM BICARBONATE 650 MG PO TABS
650.0000 mg | ORAL_TABLET | Freq: Three times a day (TID) | ORAL | Status: DC
  Administered 2011-03-27 – 2011-03-28 (×6): 650 mg via ORAL
  Filled 2011-03-27 (×9): qty 1

## 2011-03-27 MED ORDER — ALBUTEROL SULFATE (5 MG/ML) 0.5% IN NEBU
2.5000 mg | INHALATION_SOLUTION | Freq: Four times a day (QID) | RESPIRATORY_TRACT | Status: DC
  Administered 2011-03-27 – 2011-04-01 (×18): 2.5 mg via RESPIRATORY_TRACT
  Filled 2011-03-27 (×21): qty 0.5

## 2011-03-27 NOTE — Progress Notes (Signed)
   CARE MANAGEMENT NOTE 03/27/2011  Patient:  Gabrielle Hudson, Gabrielle Hudson   Account Number:  [id number]  Date Initiated:  03/27/2011  Documentation initiated by:  Onnie Boer  Subjective/Objective Assessment:   PT WAS ADMITTED WITH ACUTE RENAL FAILURE     Action/Plan:   PROGRESSION OF CARE AND DISCHARGE PLANNING   Anticipated DC Date:  04/03/2011   Anticipated DC Plan:  HOME W HOME HEALTH SERVICES  In-house referral  Clinical Social Worker      DC Planning Services  CM consult      Choice offered to / List presented to:             Status of service:  In process, will continue to follow Medicare Important Message given?   (If response is "NO", the following Medicare IM given date fields will be blank) Date Medicare IM given:   Date Additional Medicare IM given:    Discharge Disposition:    Per UR Regulation:  Reviewed for med. necessity/level of care/duration of stay  Comments:  03/27/11 Onnie Boer, RN, BSN 1453 PT WAS ADMITTED FROM [facility] FROM HOME.  PT DAYGHTER WOULD LIKE TO TRANSFER HER TO [place] AS SHE HAS NOT HAD A GOOD EXPERIENCE HERE BEFORE.  MD IS WORKING ON AN ACUTE TO ACUTE TRANSFER.  WILL F/U.

## 2011-03-27 NOTE — Consult Note (Addendum)
Name: Gabrielle Hudson MRN: [id number] DOB: 03-23-1941    LOS: 2  PCCM CONSULT NOTE  Reason for consultation: Acute on chronic resp failure, COPD, abnormal CT scan chest  Consulting MD: Arthor Captain, [facility]  History of Present Illness: 71 yo woman with a history of severe COPD, obstructive sleep apnea, and chronic respiratory failure. She was diagnosed with stage IIIB squamous cell lung carcinoma in November 2010, treated with . She also has a history of colon cancer status post resection. She was admitted to [facility] on 12/26 with acute on chronic respiratory failure in the setting of apparent exacerbation of COPD. She exhibited bilateral basilar volume loss and possible infiltrates and was treated for a possible pneumonia with cefepime and levofloxacin. She also received steroids, bronchodilators, empiric diuretics and required BiPAP for respiratory support.  Evaluation was consistent with severe sepsis without shock. Course was complicated by a stress non-ST elevation MI. She developed acute oliguric renal failure (? From aggressive diuresis) and was transferred to [facility] for further care. Her renal failure is now nonoliguric and she is no longer requiring BiPAP. She has not required hemodialysis. CT scan of the chest performed on 03/26/11 showed bilateral small pleural effusions with associated volume loss, severe emphysema, and a right hilar soft tissue density measuring approximately 2.6 x 2.2 cm that was new compared with August 2012. Also noted was an ill-defined groundglass opacity in the right upper lobe measuring approximately 2.8 cm in diameter.   Lines / Drains:  Cultures: Urine 03/26/11 >> negative  Antibiotics: Cefepime Duke Salvia) 12/26 >> 12/31 levaquin [facility] Salvia) 12/26 >>12/31 vanco 12/31 >> 1/2 Zosyn 12/31 >> 1/2  Tests / Events: CT scan chest 03/26/11 >> bilateral small pleural effusions with associated volume loss, severe emphysema, and a right hilar soft tissue density measuring  approximately 2.6 x 2.2 cm that was new compared with August 2012. New ill-defined groundglass opacity in the right upper lobe measuring approximately 2.8 cm in diameter.  TTE 03/27/11 >>    Past Medical History  Diagnosis Date  . Shortness of breath   . Sleep apnea   . Cancer Colon and Lung  . GERD (gastroesophageal reflux disease)   . Hypothyroidism   . COPD (chronic obstructive pulmonary disease) 2-3 LPM  . Diabetes mellitus newly diagnosed   Past Surgical History  Procedure Date  . Portacath placement 2010  . Colon surgery    Prior to Admission medications   Medication Sig Start Date End Date Taking? Authorizing Provider  budesonide-formoterol (SYMBICORT) 160-4.5 MCG/ACT inhaler Inhale 2 puffs into the lungs 2 (two) times daily.     Yes Historical Provider, MD  Calcium Carbonate-Vitamin D (CALCIUM + D PO) Take 1 tablet by mouth daily.     Yes Historical Provider, MD  IRON PO Take 1 tablet by mouth daily.     Yes Historical Provider, MD  levothyroxine (SYNTHROID, LEVOTHROID) 175 MCG tablet Take 175 mcg by mouth daily.     Yes Historical Provider, MD  sodium chloride (OCEAN) 0.65 % nasal spray Place 1 spray into the nose as needed. For dry nose    Yes Historical Provider, MD  tiotropium (SPIRIVA) 18 MCG inhalation capsule Place 18 mcg into inhaler and inhale daily.     Yes Historical Provider, MD   Allergies Allergies  Allergen Reactions  . Nitroglycerin In D5w Other (See Comments)    "flatlines"    Family History No family history on file.  Social History  reports that she quit smoking about 3 years  ago. Her smoking use included Cigarettes. She does not have any smokeless tobacco history on file. She reports that she does not drink alcohol or use illicit drugs.  Review Of Systems  11 points review of systems is negative with an exception of listed in HPI.  Vital Signs: Temp:  [96.1 F (35.6 C)-97.7 F (36.5 C)] 97.2 F (36.2 C) (01/02 1154) Pulse Rate:  [83-103] 103   (01/02 1154) Resp:  [14-23] 16  (01/02 1154) BP: (123-153)/(52-70) 153/70 mmHg (01/02 1154) SpO2:  [96 %-100 %] 99 % (01/02 1154) Weight:  [101.2 kg (223 lb 1.7 oz)] 223 lb 1.7 oz (101.2 kg) (01/02 0100) I/O last 3 completed shifts: In: 889.7 [P.O.:240; I.V.:233.7; IV Piggyback:416] Out: 3053 [Urine:3045; Stool:8]  Physical Examination: General:  Obese ill-appearing woman in bed, dyspneic with any movement Neuro:  Awake, alert, follows commands, moves all ext   HEENT:  OP clear, PERRL Neck: mild JVD, mild UA noise Cardiovascular:  Tachy, regular,  Lungs:  Very distant, B exp wheezes Abdomen:  Obese, distended, mildly tender Musculoskeletal:  1+ edema Skin:  No rash  Labs and Imaging:  Reviewed.  Please refer to the Assessment and Plan section for relevant results.  Assessment and Plan: 1. Acute on chronic resp failure due to GOLD3-4 COPD, obesity with OSA/OHS, possible radiation-induced restrictive lung disease. Also with stress NSTEMI and probable total body volume overload, PAH.  - corticosteroids - bronchodilators - NIPPV qhs for her OSA if she can tolerate.  2. Abnormal CT scan chest  - right hilar region and right upper lobe groundglass opacity are very concerning for possible recurrence of malignancy. Other possibilities include areas of focal radiation pneumonitis or possible infection. Malignancy would appear to be most likely.  - We'll have to decide in conjunction with the patient and her family whether the risks of invasive tissue diagnosis are worth the benefits. The right hilar region could be biopsied by video fiberoptic bronchoscopy with endobronchial ultrasound. Given her acute status I do not believe she can tolerate such a procedure. I would prefer to wait to see what her functional baseline will be after this episode has been treated and she is recovered - what will be her pulm and renal status?? Possibilities at that time include repeat CT scan in 3 months to look for  interval change versus invasive biopsy. Given her profound debilitation even at baseline there may be little to gain by repeat biopsy. It is unclear whether she could tolerate more chemotherapy or radiation therapy but we could review this with her primary oncologist Dr Pete Glatter in [place].   3. Possible CAP - she came from home, no recent hospitalizations. Should be covered for CAP, this is day 8 total abx >> would d/c vanco/zosyn and follow clinically.   4. Acute renal failure - being followed by Dr Arrie Aran, now non-oliguric, hopefully will regain function - should not be on enoxaparin w ARF >> change to heparin  Best practices / Disposition: -->SDU status under [facility] -->full code -->heparin for DVT Px -->Protonix for GI Px  Levy Pupa, MD, PhD 03/27/2011, 3:04 PM [facility] [phone] or if no answer [phone]

## 2011-03-27 NOTE — Progress Notes (Signed)
RT given PRN tx at this time. Patients shortness of breath is not relieved very much with tx. Pt does now wish to put Bipap mask on at this time. Pt feels as though it did not help her previously. RT will continue to monitor.

## 2011-03-27 NOTE — Progress Notes (Signed)
[facility]   Subjective: 71 year old female who was initially admitted to [facility] 03/20/2011 of progressive dyspnea. At time of presentation she appeared septic with a white count of 33,000 and was felt to either have a severe COPD exacerbation with underlying tracheobronchitis or pneumonia. She also was found to have acute renal failure with a creatinine of 2 and a mildly elevated BUN. Her baseline creatinine is not documented. Her treatment at [facility] consisted of broad-spectrum antibiotic therapy and at some point there was felt to be a degree of volume overload apparently for she was started on Lasix but she develop progressive renal failure and increasing shortness of breath. In addition she had mild elevation in her cardiac isoenzymes with a peak troponin I of 0.54. She underwent an echocardiogram at that facility that demonstrated a hyperdynamic EF of 70-75% but no systolic dysfunction or diastolic dysfunction. She was seen by nephrologist Dr. Hyman Hopes at [place] because her creatinine peaked greater than 6. His recommendation was to transfer to a hospital that can initiate hemodialysis. She was subsequently transferred to [facility] on 03/25/2011.  Upon arrival to [facility] patient was respiratory distress. She was on 3 L nasal cannula oxygen. Her most recent white cell count from [place] was 14,000. She denied chest pain. She was normotensive.  Today she is easily arousable but somewhat lethargic. When awake she communicates appropriately but seems to have difficulty recalling some new short-term details. She was able to confirm to me her prior cancer diagnosis and treatment received in 2010. She endorses she remain short of breath which has really not improved since arrival to [facility] or since initially being treated at [facility]. She denies cough, chest pain, nausea or vomiting.  Objective: Vital signs in last 24 hours: Temp:  [96.1 F (35.6  C)-97.7 F (36.5 C)] 97.2 F (36.2 C) (01/02 1154) Pulse Rate:  [83-103] 103  (01/02 1154) Resp:  [14-23] 16  (01/02 1154) BP: (123-153)/(52-70) 153/70 mmHg (01/02 1154) SpO2:  [96 %-100 %] 99 % (01/02 1154) Weight:  [101.2 kg (223 lb 1.7 oz)] 223 lb 1.7 oz (101.2 kg) (01/02 0100) Weight change: 11.841 kg (26 lb 1.7 oz) Last BM Date: 03/26/11  Intake/Output from previous day: 01/01 0701 - 01/02 0700 In: 223 [P.O.:120; I.V.:103] Out: 1708 [Urine:1700; Stool:8] Intake/Output this shift: Total I/O In: 360 [P.O.:360] Out: 550 [Urine:550]  General appearance: cooperative, moderate distress and slowed mentation Resp: Very coarse to auscultation bilaterally but no wheezing, midfield and basilar rhonchi present. Rate is tachypnea tach and somewhat labored, she is on nasal cannula oxygen at 2 L per minute maintaining O2 saturations between 97 and 99% Cardio: regular rate and rhythm, S1, S2 normal, no murmur, click, rub or gallop GI: soft, non-tender; bowel sounds normal; no masses,  no organomegaly, by mouth intake is poor Extremities: extremities normal, atraumatic, no cyanosis or edema Neurologic: Awakens but relatively lethargic, seems to be oriented at least to name person and place, exam is nonfocal and she is moving all extremities x4 strength about 4/5  Lab Results:  Basename 03/27/11 0500 03/26/11 0525  WBC 9.3 10.4  HGB 9.8* 9.4*  HCT 29.8* 29.1*  PLT 150 146*   BMET  Basename 03/27/11 0500 03/26/11 0525  NA 138 139  K 4.5 4.7  CL 104 106  CO2 16* 16*  GLUCOSE 97 109*  BUN 129* 118*  CREATININE 6.87* 6.54*  CALCIUM 8.1* 7.6*    Studies/Results: Ct Chest Wo Contrast  03/26/2011  *RADIOLOGY  REPORT*  Clinical Data: Severe shortness of breath with chest pain. Question lung mass.  Renal insufficiency.  CT CHEST WITHOUT CONTRAST  Technique:  Multidetector CT imaging of the chest was performed following the standard protocol without IV contrast.  Comparison: Radiographs  03/25/2011.  CT 11/05/2010 [facility] hospital).  Findings: According to prior history, the patient has a history of lung cancer.  There is a left subclavian Port-A-Cath with its tip in the SVC.  There are postsurgical changes status post thyroidectomy.  A high right paratracheal node measuring 9 mm on image 7 has mildly enlarged.  No other discrete lymph nodes are identified.  However, there is increased soft tissue density posteriorly in the right hilum, measuring approximately 2.6 x 2.2 cm on image 21.  Adjacent parenchymal opacity has also slightly progressed.  There is central distortion of the tracheobronchial tree suggesting prior radiation to this area. There is no gross hilar adenopathy allowing for the limitation of no intravenous contrast.  Small bilateral pleural effusions are present.  There are associated dependent opacities at both lung bases.  Diffuse emphysematous changes are again noted.  There is stable nodularity in the right upper lobe on images 10 and 12.  There is a new ill- defined ground-glass opacity more inferiorly in the right upper lobe on image 25, measuring approximately 2.8 cm in diameter.  As evaluated in the noncontrast state, the visualized upper abdomen appears stable with stable low density lesions in the right hepatic lobe measuring up to 2.5 cm on image 54.  No suspicious osseous lesions are identified.  IMPRESSION:  1.  Compared with the prior CT from 11/05/2010, there is increased right hilar fullness and adjacent parenchymal opacity.  These findings may be secondary to radiation pneumonitis/fibrosis. Recurrent mass cannot be excluded. 2.  New focal ground-glass opacity in the right upper lobe may be inflammatory.  Follow up is warranted to exclude atypical neoplasm. 3.  Small bilateral pleural effusions with associated bibasilar pulmonary opacities, likely atelectasis. 4.  Stable liver lesions.  PET CT should be considered to assess for hypermetabolic activity if there is  clinical concern of recurrent neoplasm.  Original Report Authenticated By: Gerrianne Scale, M.D.   Portable Chest 1 View  03/25/2011  *RADIOLOGY REPORT*  Clinical Data: Short of breath  PORTABLE CHEST - 1 VIEW  Comparison: None.  Findings: Shallow inspiration.  Cardiac enlargement with mild increased pulmonary vascularity.  Hazy infiltrates in the lung bases may represent mild edema.  No focal airspace consolidation. No blunting of costophrenic angles.  No pneumothorax.  Power port type left central venous catheter with tip in the upper SVC. Prominent right hilar and right paratracheal shadows likely represent mass and scarring.  Correlation with clinical history is recommended.  Consider CT for further evaluation if clinically indicated.  IMPRESSION: Congestive changes in the heart lungs with mild interstitial edema. Prominence of right paratracheal and right hilar tissues suggesting mass lesion.  Original Report Authenticated By: Marlon Pel, M.D.   Dg Chest Port 1v Same Day  03/27/2011  *RADIOLOGY REPORT*  Clinical Data: Shortness of breath.  PORTABLE CHEST - 1 VIEW SAME DAY  Comparison: 03/25/2011  Findings: Left subclavian Port-A-Cath is in a stable position with the tip in the upper SVC region.  Stable fullness and enlargement of the right hilum.  Prominent lung markings at the bases associated with areas of volume loss and atelectasis.  There does not appear to be significant edema.  Heart size is stable.  Trachea is  midline.  IMPRESSION: Stable chest radiograph findings.  Densities at the lung bases are most likely associated with volume loss.  Stable fullness in the right hilum.  Please refer to the chest CT on 03/25/2011 for evaluation of this area.  Original Report Authenticated By: Richarda Overlie, M.D.    Medications:  I have reviewed the patient's current medications. Scheduled:   . albuterol  2.5 mg Nebulization Q8H  . antiseptic oral rinse  15 mL Mouth Rinse q12n4p  . aspirin  325  mg Oral Daily  . calcium-vitamin D  1 tablet Oral TID  . chlorhexidine  15 mL Mouth Rinse BID  . dexamethasone  4 mg Intravenous Q6H  . diltiazem  30 mg Oral Q8H  . enoxaparin  30 mg Subcutaneous Q24H  . guaiFENesin  600 mg Oral BID  . insulin aspart  0-9 Units Subcutaneous TID WC  . ipratropium  0.5 mg Nebulization Q8H  . levothyroxine  100 mcg Oral QAC breakfast  . pantoprazole  40 mg Oral Q1200  . piperacillin-tazobactam (ZOSYN)  IV  2.25 g Intravenous Q8H  . sodium bicarbonate  650 mg Oral TID  . sodium chloride  3 mL Intravenous Q12H  . vancomycin  1,000 mg Intravenous Q48H  . DISCONTD: albuterol  2.5 mg Nebulization Q6H  . DISCONTD: ipratropium  0.5 mg Nebulization Q6H    Assessment/Plan:  Principal Problem:  *AKI (acute kidney injury)/ Metabolic acidosis True baseline creatinine unknown. She presented to [facility] with a creatinine of 2 but also elevated BUN which makes [place] suspicious she may have a somewhat lower baseline creatinine although likely has at least chronic kidney disease stage II to 3 at baseline. Currently her creatinine clearance is consistent with stage V chronic kidney disease which may be reversible. Nephrology is following. Patient has excellent urinary output with utilization of IV Lasix. Her clinical exam from a pulmonary standpoint is not consistent with volume overload therefore the nephrologist has discontinued her Lasix. He actually thinks she would benefit from gentle IV fluid hydration. Because of her ongoing persistent metabolic acidosis he has decided to initiate bicarbonate replacement therapy today-either orally or continuous infusion. At the present time she is avoiding hemodialysis but the concern is she may eventually require at least acute short-term hemodialysis this admission. Because of poor creatinine clearance critical care medicine recommends discontinuing Lovenox in favor of heparin subcutaneous.  Active Problems:  Decompensated COPD  with exacerbation (chronic obstructive pulmonary disease) Although she is not wheezing today she has had intermittent wheezing this admission but exam at this time is more consistent with either bronchitis or pneumonia process that is evolving. As a precaution she has been placed on empiric broad-spectrum antibiotic therapy prior to this admission and is currently on Zosyn and vancomycin with pharmacy managing his medications. We will continue supportive care with nebulizers and oxygen. An ABG has been obtained today shows only mild worsening of her hypoxemia but significant hypocarbia consistent with compensatory mechanism for her metabolic acidosis from her renal failure. Orders from 03/26/2011 show initiation of Decadron to use for systemic steroids. I will discuss with Dr. Elisabeth Pigeon. Consider changing to IV Solu-Medrol instead.   Hypocalcemia  Subtle hypocalcemia likely due to acute renal ferry or issues.   Acute hypoxic respiratory failure/Community acquired pneumonia Patient is normally on 2 L of oxygen at home and has decompensated prior to admission and has not improved since admission. As noted above with tachypnea and persistent sensation of shortness of breath is likely a compensatory  mechanism due to her underlying lactic acidosis and renal failure. As precaution I have asked pulmonary medicine to evaluate this patient. Because of mild hypoxemia today we will go ahead and increase the oxygen to 4 L per minute.   NSTEMI (non-ST elevated myocardial infarction) Patient presented with hypotension and mild elevation of troponin I at [facility] with nonischemic EKG. Since arrival [facility] her troponin I's have been normal although her MBs have remained elevated which is more consistent with acute renal failure. We will continue to follow.  Abnormal CT scan of lung/ H/O: lung cancer Patient has a documented history of right upper lobe lung cancer treated in 2010 with surgery, chemotherapy and  radiation. Patient is unsure of the type of tissue diagnosis she had. We have requested records from [place] including a hard copy of any CT scans and x-ray on disc. CT of the chest this admission showed abnormalities in the right upper lobe (radiation pneumonitis versus inflammatory change) and hilum (2.6 x 2.2 cm mass) which are likely due to scarring from previous treatments for cancer. Obviously would like to ensure that she has not had recurrence of cancer.  Leukocytosis Presented to [facility] with significant leukocytosis which is now resolved with appropriate initiation of broad-spectrum empiric antibiotic therapy. May also be reactive related to acute renal failure.   Anemia Etiology unclear. Her renal failure appears to be acute and therefore the anemia does not appear to be mediated at this point from renal dysfunction. There also do not appear to be any signs of acute GI bleeding. SPEP and UPEP are pending. Consider checking an anemia panel and/or stool for blood.   NSVT (nonsustained ventricular tachycardia) Had several occurrences of this arrhythmia at [facility] in the setting of potassium between 3.7 and 3.8. Magnesium level unknown at that time. Likely due to stress and recent hypotension in the setting of acute evolving renal failure etc. as a precaution we'll obtain a 2-D echocardiogram to evaluate cardiac anatomy. Please note TSH was low at 0.018 on 03/25/2011.   Hypothyroidism TSH is low at 0.018. Current dose of levothyroxine is 100 mcg daily. This dose was lowered from 175 mcg after the abnormally low TSH was discovered. Hopefully the low TSH is an acute reactant. Recommend repeating TSH along with free T4 and T3 prior to discharge and several weeks after discharge.   OSA (obstructive sleep apnea) During daytime hours has been stable off CPAP. She's actually hypocarbic at this point. Unclear if she utilizes CPAP at home or only the oxygen she is on chronically.  Monitor her closely to see if she would benefit from at least nocturnal application of CPAP.   Transaminitis Nonobstructive pattern and likely consistent with shock liver from earlier hypotension. We'll repeat cemented in the morning.  Disposition Will remain in the step down ICU due to multiple medical problems and instability. I have received a call from the patient's daughter Edwena Blow ([phone]) who has requested that her mother be transferred to [facility] to continue her care. The daughter informs me that her initial desire prior to the patient being transferred from [facility] was for her mother to go to [facility]. She further explains that it is an economic hardship for her to drive back and forth to [place] to assist in the management of her mother's care, she relates she is basically the primary caretaker and these issues. She also endorses she doesn't have many pay time off days and it would  be easier for her to leave work briefly to see her mother and the [place] area. I have discussed this with my attending physician Dr. Elisabeth Pigeon and she will contact the hospitalist service at [facility] to determine if they would be willing to accept the patient and if bed is available. The daughter has been made aware of that they may not accept the patient or if they do accept the patient that a bed may not be available at the present time. I reassured her that we would continue to aggressively treat her mothers medical problems.   LOS: 2 days   Junious Silk, ANP pager [phone]  [facility]-team 8 [email or web address] Password: [email or web address]  03/27/2011, 1:50 PM

## 2011-03-27 NOTE — Progress Notes (Signed)
  Echocardiogram 2D Echocardiogram has been performed.  Cathie Beams Deneen 03/27/2011, 3:11 PM

## 2011-03-27 NOTE — Progress Notes (Signed)
Patient ID: Gabrielle Hudson, female   DOB: 11/04/1940, 71 y.o.   MRN: [id number] S:SOB O:BP 141/67  Pulse 91  Temp(Src) 96.1 F (35.6 C) (Axillary)  Resp 15  Ht 5\' 4"  (1.626 m)  Wt 101.2 kg (223 lb 1.7 oz)  BMI 38.30 kg/m2  SpO2 99%  Intake/Output Summary (Last 24 hours) at 03/27/11 0910 Last data filed at 03/27/11 0813  Gross per 24 hour  Intake    223 ml  Output   1858 ml  Net  -1635 ml   Weight change: 11.841 kg (26 lb 1.7 oz) WUJ:WJXBJ WF in mod distress CVS:no rub Resp:scattered rhonchi and exp wheezes YNW:GNFAO, o/w benign Ext:no edema   Lab 03/27/11 0500 03/26/11 0525 03/25/11 2040  NA 138 139 135  K 4.5 4.7 4.7  CL 104 106 104  CO2 16* 16* 16*  GLUCOSE 97 109* 149*  BUN 129* 118* 112*  CREATININE 6.87* 6.54* 6.49*  ALB -- -- --  CALCIUM 8.1* 7.6* 7.3*  PHOS -- -- 5.4*  AST -- 61* 64*  ALT -- 60* 54*   Liver Function Tests:  Lab 03/26/11 0525 03/25/11 2040  AST 61* 64*  ALT 60* 54*  ALKPHOS 61 62  BILITOT 0.2* 0.2*  PROT 5.1* 5.2*  ALBUMIN 2.0* 2.1*   No results found for this basename: LIPASE:3,AMYLASE:3 in the last 168 hours  Lab 03/25/11 2120  AMMONIA <10*   CBC:  Lab 03/27/11 0500 03/26/11 0525 03/25/11 2040  WBC 9.3 10.4 11.2*  NEUTROABS -- -- 10.2*  HGB 9.8* 9.4* 9.6*  HCT 29.8* 29.1* 29.9*  MCV 82.5 84.3 85.2  PLT 150 146* 145*   Cardiac Enzymes:  Lab 03/26/11 2015 03/26/11 1400 03/26/11 0525 03/25/11 2150  CKTOTAL 74 85 105 124  CKMB 10.1* 11.1* 12.5* 14.0*  CKMBINDEX -- -- -- --  TROPONINI <0.30 <0.30 <0.30 <0.30   CBG:  Lab 03/27/11 0811 03/26/11 2212 03/26/11 1613 03/26/11 1214 03/26/11 0848  GLUCAP 123* 112* 136* 153* 99    Iron Studies: No results found for this basename: IRON,TIBC,TRANSFERRIN,FERRITIN in the last 72 hours Studies/Results: Ct Chest Wo Contrast  03/26/2011  *RADIOLOGY REPORT*  Clinical Data: Severe shortness of breath with chest pain. Question lung mass.  Renal insufficiency.  CT CHEST WITHOUT  CONTRAST  Technique:  Multidetector CT imaging of the chest was performed following the standard protocol without IV contrast.  Comparison: Radiographs 03/25/2011.  CT 11/05/2010 [facility] hospital).  Findings: According to prior history, the patient has a history of lung cancer.  There is a left subclavian Port-A-Cath with its tip in the SVC.  There are postsurgical changes status post thyroidectomy.  A high right paratracheal node measuring 9 mm on image 7 has mildly enlarged.  No other discrete lymph nodes are identified.  However, there is increased soft tissue density posteriorly in the right hilum, measuring approximately 2.6 x 2.2 cm on image 21.  Adjacent parenchymal opacity has also slightly progressed.  There is central distortion of the tracheobronchial tree suggesting prior radiation to this area. There is no gross hilar adenopathy allowing for the limitation of no intravenous contrast.  Small bilateral pleural effusions are present.  There are associated dependent opacities at both lung bases.  Diffuse emphysematous changes are again noted.  There is stable nodularity in the right upper lobe on images 10 and 12.  There is a new ill- defined ground-glass opacity more inferiorly in the right upper lobe on image 25, measuring approximately 2.8 cm in  diameter.  As evaluated in the noncontrast state, the visualized upper abdomen appears stable with stable low density lesions in the right hepatic lobe measuring up to 2.5 cm on image 54.  No suspicious osseous lesions are identified.  IMPRESSION:  1.  Compared with the prior CT from 11/05/2010, there is increased right hilar fullness and adjacent parenchymal opacity.  These findings may be secondary to radiation pneumonitis/fibrosis. Recurrent mass cannot be excluded. 2.  New focal ground-glass opacity in the right upper lobe may be inflammatory.  Follow up is warranted to exclude atypical neoplasm. 3.  Small bilateral pleural effusions with associated  bibasilar pulmonary opacities, likely atelectasis. 4.  Stable liver lesions.  PET CT should be considered to assess for hypermetabolic activity if there is clinical concern of recurrent neoplasm.  Original Report Authenticated By: Gerrianne Scale, M.D.   Portable Chest 1 View  03/25/2011  *RADIOLOGY REPORT*  Clinical Data: Short of breath  PORTABLE CHEST - 1 VIEW  Comparison: None.  Findings: Shallow inspiration.  Cardiac enlargement with mild increased pulmonary vascularity.  Hazy infiltrates in the lung bases may represent mild edema.  No focal airspace consolidation. No blunting of costophrenic angles.  No pneumothorax.  Power port type left central venous catheter with tip in the upper SVC. Prominent right hilar and right paratracheal shadows likely represent mass and scarring.  Correlation with clinical history is recommended.  Consider CT for further evaluation if clinically indicated.  IMPRESSION: Congestive changes in the heart lungs with mild interstitial edema. Prominence of right paratracheal and right hilar tissues suggesting mass lesion.  Original Report Authenticated By: Marlon Pel, M.D.   Dg Chest Port 1v Same Day  03/27/2011  *RADIOLOGY REPORT*  Clinical Data: Shortness of breath.  PORTABLE CHEST - 1 VIEW SAME DAY  Comparison: 03/25/2011  Findings: Left subclavian Port-A-Cath is in a stable position with the tip in the upper SVC region.  Stable fullness and enlargement of the right hilum.  Prominent lung markings at the bases associated with areas of volume loss and atelectasis.  There does not appear to be significant edema.  Heart size is stable.  Trachea is midline.  IMPRESSION: Stable chest radiograph findings.  Densities at the lung bases are most likely associated with volume loss.  Stable fullness in the right hilum.  Please refer to the chest CT on 03/25/2011 for evaluation of this area.  Original Report Authenticated By: Richarda Overlie, M.D.      . albuterol  2.5 mg  Nebulization Q8H  . antiseptic oral rinse  15 mL Mouth Rinse q12n4p  . aspirin  325 mg Oral Daily  . calcium-vitamin D  1 tablet Oral TID  . chlorhexidine  15 mL Mouth Rinse BID  . dexamethasone  4 mg Intravenous Q6H  . diltiazem  30 mg Oral Q8H  . enoxaparin  30 mg Subcutaneous Q24H  . guaiFENesin  600 mg Oral BID  . insulin aspart  0-9 Units Subcutaneous TID WC  . ipratropium  0.5 mg Nebulization Q8H  . levothyroxine  100 mcg Oral QAC breakfast  . pantoprazole  40 mg Oral Q1200  . piperacillin-tazobactam (ZOSYN)  IV  2.25 g Intravenous Q8H  . sodium bicarbonate  650 mg Oral TID  . sodium chloride  3 mL Intravenous Q12H  . vancomycin  1,000 mg Intravenous Q48H  . DISCONTD: albuterol  2.5 mg Nebulization Q6H  . DISCONTD: ipratropium  0.5 mg Nebulization Q6H    BMET    Component Value  Date/Time   NA 138 03/27/2011 0500   K 4.5 03/27/2011 0500   CL 104 03/27/2011 0500   CO2 16* 03/27/2011 0500   GLUCOSE 97 03/27/2011 0500   BUN 129* 03/27/2011 0500   CREATININE 6.87* 03/27/2011 0500   CALCIUM 8.1* 03/27/2011 0500   GFRNONAA 5* 03/27/2011 0500   GFRAA 6* 03/27/2011 0500   CBC    Component Value Date/Time   WBC 9.3 03/27/2011 0500   RBC 3.61* 03/27/2011 0500   HGB 9.8* 03/27/2011 0500   HCT 29.8* 03/27/2011 0500   PLT 150 03/27/2011 0500   MCV 82.5 03/27/2011 0500   MCH 27.1 03/27/2011 0500   MCHC 32.9 03/27/2011 0500   RDW 15.5 03/27/2011 0500   LYMPHSABS 0.6* 03/25/2011 2040   MONOABS 0.4 03/25/2011 2040   EOSABS 0.0 03/25/2011 2040   BASOSABS 0.0 03/25/2011 2040     Assessment/Plan: 1. ARF- now nonoliguric with marked increase in urine production over the last 24 hours. Continue to follow and dose with lasix prn. No indication for HD at this time.  Given increased BUN/creat, lack of edema, and tachycardia will stop lasix.  And start gently hydration although she may require HD in next 24-48 hours if she continues to worsen, however renal fxn has not significantly changed since admission 2. SOB-  likely decompensated COPD +/- PNA. Is not related to volume/pulm edema.  Cont with pulm toilet and nebs/inhalers and recommend pulmonary eval.  Also likely worsened by met acidosis.  Will check abg 3. CAP- on zosyn 4. HTN- stable 5. Elevated LFT's ? Due to hypotension. Cont to follow 6. Anemia- proceed with w/u including SPEP and UPEP 7. NSTEMI- agree with Echo 8. Enlarging hilar mass- PCCM to see pt.  Alejandrina Raimer A

## 2011-03-28 ENCOUNTER — Encounter (HOSPITAL_COMMUNITY): Payer: Self-pay | Admitting: Family Medicine

## 2011-03-28 DIAGNOSIS — R918 Other nonspecific abnormal finding of lung field: Secondary | ICD-10-CM

## 2011-03-28 DIAGNOSIS — J9600 Acute respiratory failure, unspecified whether with hypoxia or hypercapnia: Secondary | ICD-10-CM

## 2011-03-28 DIAGNOSIS — J4489 Other specified chronic obstructive pulmonary disease: Secondary | ICD-10-CM

## 2011-03-28 DIAGNOSIS — J449 Chronic obstructive pulmonary disease, unspecified: Secondary | ICD-10-CM

## 2011-03-28 LAB — GLUCOSE, CAPILLARY
Glucose-Capillary: 145 mg/dL — ABNORMAL HIGH (ref 70–99)
Glucose-Capillary: 150 mg/dL — ABNORMAL HIGH (ref 70–99)
Glucose-Capillary: 139 mg/dL — ABNORMAL HIGH (ref 70–99)

## 2011-03-28 LAB — ANTISTREPTOLYSIN O TITER: ASO: 32 IU/mL (ref 0–408)

## 2011-03-28 LAB — UIFE/LIGHT CHAINS/TP QN, 24-HR UR
Albumin, U: DETECTED
Alpha 1, Urine: DETECTED — AB
Alpha 2, Urine: DETECTED — AB
Beta, Urine: DETECTED — AB
Free Kappa Lt Chains,Ur: 6.9 mg/dL — ABNORMAL HIGH (ref 0.14–2.42)
Free Kappa/Lambda Ratio: 2.57 ratio (ref 2.04–10.37)
Free Lambda Lt Chains,Ur: 2.68 mg/dL — ABNORMAL HIGH (ref 0.02–0.67)
Gamma Globulin, Urine: DETECTED — AB
Total Protein, Urine: 12.7 mg/dL

## 2011-03-28 LAB — PROTEIN ELECTROPHORESIS, SERUM
Albumin ELP: 52.5 % — ABNORMAL LOW (ref 55.8–66.1)
Alpha-1-Globulin: 8.7 % — ABNORMAL HIGH (ref 2.9–4.9)
Alpha-2-Globulin: 18.8 % — ABNORMAL HIGH (ref 7.1–11.8)
Beta 2: 4.7 % (ref 3.2–6.5)
Beta Globulin: 4.5 % — ABNORMAL LOW (ref 4.7–7.2)
Gamma Globulin: 10.8 % — ABNORMAL LOW (ref 11.1–18.8)
M-Spike, %: NOT DETECTED g/dL
Total Protein ELP: 4.7 g/dL — ABNORMAL LOW (ref 6.0–8.3)

## 2011-03-28 LAB — COMPREHENSIVE METABOLIC PANEL
ALT: 62 U/L — ABNORMAL HIGH (ref 0–35)
AST: 24 U/L (ref 0–37)
Albumin: 2.2 g/dL — ABNORMAL LOW (ref 3.5–5.2)
Alkaline Phosphatase: 60 U/L (ref 39–117)
BUN: 134 mg/dL — ABNORMAL HIGH (ref 6–23)
CO2: 18 mEq/L — ABNORMAL LOW (ref 19–32)
Calcium: 7.7 mg/dL — ABNORMAL LOW (ref 8.4–10.5)
Chloride: 108 mEq/L (ref 96–112)
Creatinine, Ser: 6.12 mg/dL — ABNORMAL HIGH (ref 0.50–1.10)
GFR calc Af Amer: 7 mL/min — ABNORMAL LOW (ref >90)
GFR calc non Af Amer: 6 mL/min — ABNORMAL LOW (ref >90)
Glucose, Bld: 214 mg/dL — ABNORMAL HIGH (ref 70–99)
Potassium: 5.3 mEq/L — ABNORMAL HIGH (ref 3.5–5.1)
Sodium: 142 mEq/L (ref 135–145)
Total Bilirubin: 0.2 mg/dL — ABNORMAL LOW (ref 0.3–1.2)
Total Protein: 5.4 g/dL — ABNORMAL LOW (ref 6.0–8.3)

## 2011-03-28 LAB — C4 COMPLEMENT: Complement C4, Body Fluid: 17 mg/dL (ref 10–40)

## 2011-03-28 LAB — C3 COMPLEMENT: C3 Complement: 97 mg/dL (ref 90–180)

## 2011-03-28 LAB — ANA: Anti Nuclear Antibody(ANA): NEGATIVE

## 2011-03-28 LAB — ANTI-DNA ANTIBODY, DOUBLE-STRANDED: ds DNA Ab: <1 IU/mL (ref <30)

## 2011-03-28 LAB — MPO/PR-3 (ANCA) ANTIBODIES

## 2011-03-28 LAB — PHOSPHORUS: Phosphorus: 4.7 mg/dL — ABNORMAL HIGH (ref 2.3–4.6)

## 2011-03-28 LAB — GLOMERULAR BASEMENT MEMBRANE ANTIBODIES

## 2011-03-28 MED ORDER — WHITE PETROLATUM GEL
Status: AC
  Administered 2011-03-28: 1
  Filled 2011-03-28: qty 5

## 2011-03-28 MED ORDER — METHYLPREDNISOLONE SODIUM SUCC 40 MG IJ SOLR
40.0000 mg | Freq: Three times a day (TID) | INTRAMUSCULAR | Status: DC
  Administered 2011-03-28 – 2011-03-29 (×3): 40 mg via INTRAVENOUS
  Filled 2011-03-28 (×6): qty 1

## 2011-03-28 MED ORDER — STERILE WATER FOR INJECTION IV SOLN
INTRAVENOUS | Status: DC
  Administered 2011-03-28 – 2011-03-29 (×3): via INTRAVENOUS
  Filled 2011-03-28 (×4): qty 850

## 2011-03-28 NOTE — Progress Notes (Addendum)
[facility]   Subjective: 71 year old female who was initially admitted to [facility] 03/20/2011 of progressive dyspnea. At time of presentation she appeared septic with a white count of 33,000 and was felt to either have a severe COPD exacerbation with underlying tracheobronchitis or pneumonia. She also was found to have acute renal failure with a creatinine of 2 and a mildly elevated BUN. Her baseline creatinine is not documented. Her treatment at [facility] consisted of broad-spectrum antibiotic therapy and at some point there was felt to be a degree of volume overload apparently for she was started on Lasix but she develop progressive renal failure and increasing shortness of breath. In addition she had mild elevation in her cardiac isoenzymes with a peak troponin I of 0.54. She underwent an echocardiogram at that facility that demonstrated a hyperdynamic EF of 70-75% but no systolic dysfunction or diastolic dysfunction. She was seen by nephrologist Dr. Hyman Hopes at [place] because her creatinine peaked greater than 6. His recommendation was to transfer to a hospital that can initiate hemodialysis. She was subsequently transferred to [facility] on 03/25/2011.  Upon arrival to [facility] patient was respiratory distress. She was on 3 L nasal cannula oxygen. Her most recent white cell count from [place] was 14,000. She denied chest pain. She was normotensive.  Today she is much more alert and although she remains in moderate respiratory distress this is much less than yesterday and is actually only most notable when she is attempting to vocalize. She has been having difficulty with progressive dyspnea for several months prior to this admission.  Objective: Vital signs in last 24 hours: Temp:  [96.5 F (35.8 C)-97.7 F (36.5 C)] 97.6 F (36.4 C) (01/03 1209) Pulse Rate:  [82-102] 98  (01/03 1209) Resp:  [14-19] 16  (01/03 1209) BP: (121-156)/(49-67) 153/55 mmHg (01/03  1209) SpO2:  [98 %-100 %] 100 % (01/03 1235) Weight:  [100 kg (220 lb 7.4 oz)] 220 lb 7.4 oz (100 kg) (01/03 0500) Weight change: -1.2 kg (-2 lb 10.3 oz) Last BM Date: 03/27/11  Intake/Output from previous day: 01/02 0701 - 01/03 0700 In: 897 [P.O.:597; I.V.:200; IV Piggyback:100] Out: 1900 [Urine:1900] Intake/Output this shift: Total I/O In: 420 [P.O.:420] Out: 300 [Urine:300]  General appearance: cooperative, moderate distress and improved mentation Resp: Remain coarse to auscultation bilaterally but no wheezing, persistent midfield and basilar rhonchi present. Previous tachypnea has resolved and respiratory rate only increases with attempts to verbalize, at rest her respiratory effort is nonlabored she is on nasal cannula oxygen at 4 L per minute maintaining O2 saturations between 97 and 99% Cardio: regular rate and rhythm, S1, S2 normal, no murmur, click, rub or gallop GI: soft, non-tender; bowel sounds normal; no masses,  no organomegaly, by mouth intake is poor Extremities: extremities normal, atraumatic, no cyanosis or edema Neurologic: Awakens but relatively lethargic, seems to be oriented at least to name person and place, exam is nonfocal and she is moving all extremities x4 strength about 4/5  Lab Results:  Basename 03/27/11 0500 03/26/11 0525  WBC 9.3 10.4  HGB 9.8* 9.4*  HCT 29.8* 29.1*  PLT 150 146*   BMET  Basename 03/28/11 0500 03/27/11 0500  NA 142 138  K 5.3* 4.5  CL 108 104  CO2 18* 16*  GLUCOSE 214* 97  BUN 134* 129*  CREATININE 6.12* 6.87*  CALCIUM 7.7* 8.1*    Studies/Results: Dg Chest Port 1v Same Day  03/27/2011  *RADIOLOGY REPORT*  Clinical Data: Shortness of breath.  PORTABLE CHEST - 1 VIEW SAME DAY  Comparison: 03/25/2011  Findings: Left subclavian Port-A-Cath is in a stable position with the tip in the upper SVC region.  Stable fullness and enlargement of the right hilum.  Prominent lung markings at the bases associated with areas of volume loss  and atelectasis.  There does not appear to be significant edema.  Heart size is stable.  Trachea is midline.  IMPRESSION: Stable chest radiograph findings.  Densities at the lung bases are most likely associated with volume loss.  Stable fullness in the right hilum.  Please refer to the chest CT on 03/25/2011 for evaluation of this area.  Original Report Authenticated By: Richarda Overlie, M.D.    Medications:  I have reviewed the patient's current medications. Scheduled:    . albuterol  2.5 mg Nebulization QID  . antiseptic oral rinse  15 mL Mouth Rinse q12n4p  . aspirin  325 mg Oral Daily  . calcium-vitamin D  1 tablet Oral TID  . chlorhexidine  15 mL Mouth Rinse BID  . dexamethasone  4 mg Intravenous Q6H  . diltiazem  30 mg Oral Q8H  . guaiFENesin  600 mg Oral BID  . heparin subcutaneous  5,000 Units Subcutaneous Q8H  . insulin aspart  0-9 Units Subcutaneous TID WC  . ipratropium  0.5 mg Nebulization QID  . levothyroxine  100 mcg Oral QAC breakfast  . pantoprazole  40 mg Oral Q1200  . sodium bicarbonate  650 mg Oral TID  . sodium chloride  3 mL Intravenous Q12H  . DISCONTD: albuterol  2.5 mg Nebulization Q8H  . DISCONTD: enoxaparin  30 mg Subcutaneous Q24H  . DISCONTD: ipratropium  0.5 mg Nebulization Q8H  . DISCONTD: piperacillin-tazobactam (ZOSYN)  IV  2.25 g Intravenous Q8H  . DISCONTD: vancomycin  1,000 mg Intravenous Q48H    Assessment/Plan:  Principal Problem:  *AKI (acute kidney injury)-non-oliguric/ Metabolic acidosis True baseline creatinine unknown. She presented to [facility] with a creatinine of 2 but also elevated BUN which makes [place] suspicious she may have a somewhat lower baseline creatinine although likely has at least chronic kidney disease stage II to 3 at baseline. Currently her creatinine clearance is consistent with stage V chronic kidney disease which may be reversible. Nephrology is following. Patient has excellent urinary output with utilization of IV  Lasix. Her clinical exam from a pulmonary standpoint is not consistent with volume overload therefore the nephrologist has discontinued her Lasix. He actually thinks she would benefit from gentle IV fluid hydration. Because of her persistent metabolic acidosis oral bicarbonate therapy has been transitioned to bicarbonate drip at 50 cc per hour . At the present time she is avoiding hemodialysis but the concern is she may eventually require at least acute short-term hemodialysis this admission. Because of poor creatinine clearance critical care medicine recommends discontinuing Lovenox in favor of heparin subcutaneous.  Active Problems:  Decompensated COPD with exacerbation (chronic obstructive pulmonary disease) Although she is not wheezing today she has had intermittent wheezing this admission but exam at this time is more consistent with either bronchitis or pneumonia process that is evolving. As a precaution she has been placed on empiric broad-spectrum antibiotic therapy prior to this admission and is currently on Zosyn and vancomycin with pharmacy managing his medications. We will continue supportive care with nebulizers and oxygen. An ABG has been obtained today shows only mild worsening of her hypoxemia but significant hypocarbia consistent with compensatory mechanism for her metabolic acidosis from her renal failure. We'll  discontinue Decadron in favor of lower dose Solu-Medrol 40 mg IV every 8 hours and follow.   Hypocalcemia RESOLVED  Today's calcium is 7.7 with an albumin of 2.2 which is a corrected calcium of 9.1   Acute hypoxic respiratory failure/Community acquired pneumonia Patient is normally on 2 L of oxygen at home and was experiencing progressive dyspnea at home prior to this admission. Her previous tachypnea and persistent sensation of shortness of breath which is likely a compensatory mechanism due to her underlying lactic acidosis and renal failure is improving. Pulmonary medicine did  evaluate the patient yesterday. They have also reviewed her serial CT scans of the chest including the one from this admission and have discussed with the radiologist. It appears as if she has 2 issues going on from a pulmonary standpoint. One is concern over possible recurrence of her lung cancer and the second is an acute infectious versus an inflammatory process causing a groundglass appearance of the lungs. Recommendation is to treat the second issue is an acute infection and continue antibiotics for a total of 7 days.    NSTEMI (non-ST elevated myocardial infarction) Patient presented with hypotension and mild elevation of troponin I at [facility] with nonischemic EKG. Since arrival [facility] her troponin I's have been normal although her MBs have remained elevated which is more consistent with acute renal failure. We will continue to follow.  Abnormal CT scan of lung/ H/O: lung cancer Patient has a documented history of right upper lobe lung cancer treated in 2010 with surgery, chemotherapy and radiation. Patient is unsure of the type of tissue diagnosis she had. We have requested records from [facility] including a hard copy of any CT scans and x-ray on disc. CT of the chest this admission showed abnormalities in the right upper lobe (radiation pneumonitis versus inflammatory change) and hilum (2.6 x 2.2 cm mass) which are likely due to scarring from previous treatments for cancer. Pulmonology is concerned over possible evolving recurrence of her pulmonary cancer. Intercurrent physiologic state of decompensation she is not a candidate at the present time for invasive diagnostic procedure such as fiberoptic bronchoscopy. Because of her underlying lung disease she is likely not a candidate for general anesthesia and surgical resection of any recurrent pulmonary mass.  Leukocytosis Presented to [facility] with significant leukocytosis which is now resolved with appropriate initiation of  broad-spectrum empiric antibiotic therapy. May also be reactive related to acute renal failure.   Anemia Etiology unclear. Her renal failure appears to be acute and therefore the anemia does not appear to be mediated at this point from renal dysfunction. There also do not appear to be any signs of acute GI bleeding. SPEP and UPEP are pending. Consider checking an anemia panel and/or stool for blood. Haptoglobin is normal so there is no evidence of hemolysis.   NSVT (nonsustained ventricular tachycardia) Had several occurrences of this arrhythmia at [facility] in the setting of potassium between 3.7 and 3.8. Magnesium level unknown at that time. Likely due to stress and recent hypotension in the setting of acute evolving renal failure etc. A  2-D echocardiogram to evaluate cardiac anatomy has been completed this admission and shows hyperdynamic ejection fraction of 65-70% with no regional wall motion abnormalities and other parameters consistent with grade 1 diastolic dysfunction. She has mild pulmonary hypertension 41 mmHg and there is a question of a small pericardial effusion there is also a mildly elevated LDL T. gradient. Please note TSH was low at 0.018 on 03/25/2011.  Hypothyroidism TSH is low at 0.018. Current dose of levothyroxine is 100 mcg daily. This dose was lowered from 175 mcg after the abnormally low TSH was discovered. Hopefully the low TSH is an acute reactant. Recommend repeating TSH along with free T4 and T3 prior to discharge and several weeks after discharge.   OSA (obstructive sleep apnea) During daytime hours has been stable off CPAP. She's actually hypocarbic at this point. Unclear if she utilizes CPAP at home or only the oxygen she is on chronically. Monitor her closely to see if she would benefit from at least nocturnal application of CPAP.   Transaminitis Nonobstructive pattern and likely consistent with shock liver from earlier hypotension. We'll repeat c met in the  morning.  Disposition Will remain in the step down ICU due to multiple medical problems and instability. I have received a call from the patient's daughter Edwena Blow ([phone]) who has requested that her mother be transferred to [facility] to continue her care. The daughter informs me that her initial desire prior to the patient being transferred from [facility] was for her mother to go to [facility]. She further explains that it is an economic hardship for her to drive back and forth to [place] to assist in the management of her mother's care, she relates she is basically the primary caretaker and these issues. She also endorses she doesn't have many pay time off days and it would be easier for her to leave work briefly to see her mother and the [place] area. We were unsuccessful in contacting [facility] regarding a possible transfer yesterday. Dr. Butler Denmark will call [facility] today and attempt to contact the hospitalist service care to see if they will accept this patient in transfer.   LOS: 3 days   Junious Silk, ANP pager [phone]  [facility] 8 [email or web address] Password: [email or web address]  03/28/2011, 1:14 PM

## 2011-03-28 NOTE — Progress Notes (Signed)
Patient ID: ZAYA KESSENICH, female   DOB: May 16, 1940, 71 y.o.   MRN: [id number] S:SOB feeling better.   O:BP 131/52  Pulse 82  Temp(Src) 97.7 F (36.5 C) (Oral)  Resp 14  Ht 5\' 4"  (1.626 m)  Wt 220 lb 7.4 oz (100 kg)  BMI 37.84 kg/m2  SpO2 98%  Intake/Output Summary (Last 24 hours) at 03/28/11 0949 Last data filed at 03/28/11 0831  Gross per 24 hour  Intake    757 ml  Output   1950 ml  Net  -1193 ml   Weight change: -2 lb 10.3 oz (-1.2 kg) OZH:YQMVH WF in mild distress when changing positions.  CVS:no rub Resp: few scattered rhonchi and exp wheezes QIO:NGEXB, o/w benign Ext:no edema   Lab 03/28/11 0500 03/27/11 0500 03/26/11 0525 03/25/11 2040  NA 142 138 139 135  K 5.3* 4.5 4.7 4.7  CL 108 104 106 104  CO2 18* 16* 16* 16*  GLUCOSE 214* 97 109* 149*  BUN 134* 129* 118* 112*  CREATININE 6.12* 6.87* 6.54* 6.49*  ALB -- -- -- --  CALCIUM 7.7* 8.1* 7.6* 7.3*  PHOS 4.7* -- -- 5.4*  AST 24 -- 61* 64*  ALT 62* -- 60* 54*   Liver Function Tests:  Lab 03/28/11 0500 03/26/11 0525 03/25/11 2040  AST 24 61* 64*  ALT 62* 60* 54*  ALKPHOS 60 61 62  BILITOT 0.2* 0.2* 0.2*  PROT 5.4* 5.1* 5.2*  ALBUMIN 2.2* 2.0* 2.1*   No results found for this basename: LIPASE:3,AMYLASE:3 in the last 168 hours  Lab 03/25/11 2120  AMMONIA <10*   CBC:  Lab 03/27/11 0500 03/26/11 0525 03/25/11 2040  WBC 9.3 10.4 11.2*  NEUTROABS -- -- 10.2*  HGB 9.8* 9.4* 9.6*  HCT 29.8* 29.1* 29.9*  MCV 82.5 84.3 85.2  PLT 150 146* 145*   Cardiac Enzymes:  Lab 03/26/11 2015 03/26/11 1400 03/26/11 0525 03/25/11 2150  CKTOTAL 74 85 105 124  CKMB 10.1* 11.1* 12.5* 14.0*  CKMBINDEX -- -- -- --  TROPONINI <0.30 <0.30 <0.30 <0.30   CBG:  Lab 03/28/11 0819 03/27/11 2125 03/27/11 1646 03/27/11 1146 03/27/11 0811  GLUCAP 145* 215* 238* 240* 123*    Iron Studies: No results found for this basename: IRON,TIBC,TRANSFERRIN,FERRITIN in the last 72 hours Studies/Results: Dg Chest Port 1v Same  Day  03/27/2011  *RADIOLOGY REPORT*  Clinical Data: Shortness of breath.  PORTABLE CHEST - 1 VIEW SAME DAY  Comparison: 03/25/2011  Findings: Left subclavian Port-A-Cath is in a stable position with the tip in the upper SVC region.  Stable fullness and enlargement of the right hilum.  Prominent lung markings at the bases associated with areas of volume loss and atelectasis.  There does not appear to be significant edema.  Heart size is stable.  Trachea is midline.  IMPRESSION: Stable chest radiograph findings.  Densities at the lung bases are most likely associated with volume loss.  Stable fullness in the right hilum.  Please refer to the chest CT on 03/25/2011 for evaluation of this area.  Original Report Authenticated By: Richarda Overlie, M.D.   ECHO 03/27/11: EF 60-65%, mild LVH. Mild Pulm HTN.     Marland Kitchen albuterol  2.5 mg Nebulization QID  . antiseptic oral rinse  15 mL Mouth Rinse q12n4p  . aspirin  325 mg Oral Daily  . calcium-vitamin D  1 tablet Oral TID  . chlorhexidine  15 mL Mouth Rinse BID  . dexamethasone  4 mg Intravenous Q6H  .  diltiazem  30 mg Oral Q8H  . guaiFENesin  600 mg Oral BID  . heparin subcutaneous  5,000 Units Subcutaneous Q8H  . insulin aspart  0-9 Units Subcutaneous TID WC  . ipratropium  0.5 mg Nebulization QID  . levothyroxine  100 mcg Oral QAC breakfast  . pantoprazole  40 mg Oral Q1200  . sodium bicarbonate  650 mg Oral TID  . sodium chloride  3 mL Intravenous Q12H  . DISCONTD: albuterol  2.5 mg Nebulization Q8H  . DISCONTD: enoxaparin  30 mg Subcutaneous Q24H  . DISCONTD: ipratropium  0.5 mg Nebulization Q8H  . DISCONTD: piperacillin-tazobactam (ZOSYN)  IV  2.25 g Intravenous Q8H  . DISCONTD: vancomycin  1,000 mg Intravenous Q48H    BMET    Component Value Date/Time   NA 142 03/28/2011 0500   K 5.3* 03/28/2011 0500   CL 108 03/28/2011 0500   CO2 18* 03/28/2011 0500   GLUCOSE 214* 03/28/2011 0500   BUN 134* 03/28/2011 0500   CREATININE 6.12* 03/28/2011 0500   CALCIUM 7.7*  03/28/2011 0500   GFRNONAA 6* 03/28/2011 0500   GFRAA 7* 03/28/2011 0500   CBC    Component Value Date/Time   WBC 9.3 03/27/2011 0500   RBC 3.61* 03/27/2011 0500   HGB 9.8* 03/27/2011 0500   HCT 29.8* 03/27/2011 0500   PLT 150 03/27/2011 0500   MCV 82.5 03/27/2011 0500   MCH 27.1 03/27/2011 0500   MCHC 32.9 03/27/2011 0500   RDW 15.5 03/27/2011 0500   LYMPHSABS 0.6* 03/25/2011 2040   MONOABS 0.4 03/25/2011 2040   EOSABS 0.0 03/25/2011 2040   BASOSABS 0.0 03/25/2011 2040     Assessment/Plan: 1. ARF- continues to be nonoliguric with adequate urine production over the last 24 hours. Reviewed previous hospital records, Cr 0.6 in 2010. Today improved Cr, improved bicarb, stable BUN, no evidence of fluid overload. Normal EF on ECHO. Will start gentle hydration with sodium bicarb gtt, continue at 50 ml/hr, continue oral bicarb and trend K+.  No indication for dialysis today.  2. SOB-  Improved. Decompensated COPD +/- PNA. Is not related to volume/pulm edema.   3. CAP- Improved. Afebrile with normal WBC.  completed a 7 day course IV Abx (maxipime and Levaquin --> Vanc and Zosyn)  4. HTN- stable 5. Elevated LFT's-likely due to hypotension. Trending down.  6. Anemia- Normal haptoglobin. No evidence of hemolysis or active bleeding. Proceed with w/u including SPEP and UPEP per primary team.  7. NSTEMI- stable ECHO no regional wall motion abnormality.  8. Enlarging hilar mass- PCCM to see pt.  Keviana Guida 10:12 AM 03/27/10

## 2011-03-28 NOTE — Progress Notes (Signed)
I have seen and examined this patient and agree with plan as outlined above.  Please refer to FPR2 PN for full details.  Pt is starting to have a decline in Scr and maintaining UOP.  Respiratory status has also improved.  Will start bicarb drip and follow.  No indication for dialysis at this time. Deari Sessler A,MD 03/28/2011 1:16 PM

## 2011-03-28 NOTE — Consult Note (Signed)
Name: Gabrielle Hudson MRN: [id number] DOB: 11/29/1940    LOS: 3  PCCM CONSULT NOTE  Reason for consultation: Acute on chronic resp failure, COPD, abnormal CT scan chest  Consulting MD: Arthor Captain, [facility]  History of Present Illness: 69 yowf with severe COPD, obstructive sleep apnea, and chronic respiratory failure diagnosed with stage IIIB squamous cell lung carcinoma in November 2010, treated with . She also has a history of colon cancer status post resection. She was admitted to [facility] on 12/26 with acute on chronic respiratory failure in the setting of apparent exacerbation of COPD. She exhibited bilateral basilar volume loss and possible infiltrates and was treated for a possible pneumonia with cefepime and levofloxacin. She also received steroids, bronchodilators, empiric diuretics and required BiPAP for respiratory support.  Evaluation was consistent with severe sepsis without shock. Course was complicated by a stress non-ST elevation MI. She developed acute oliguric renal failure (? From aggressive diuresis) and was transferred to [facility] for further care. Her renal failure is now nonoliguric and she is no longer requiring BiPAP. She has not required hemodialysis. CT scan of the chest performed on 03/26/11 showed bilateral small pleural effusions with associated volume loss, severe emphysema, and a right hilar soft tissue density measuring approximately 2.6 x 2.2 cm that was new compared with August 2012. Also noted was an ill-defined groundglass opacity in the right upper lobe measuring approximately 2.8 cm in diameter.     Cultures: MRSA screen 12/31 > neg Urine 1/1 >> negative  Antibiotics: Cefepime Duke Salvia) 12/26 >> 12/31 levaquin Duke Salvia) 12/26 >>12/31 vanco 12/31 >> 1/2 Zosyn 12/31 >> 1/2  Tests / Events: CT scan chest 1/1 >> bilateral small pleural effusions with associated volume loss, severe emphysema, and a right hilar soft tissue density measuring approximately 2.6 x  2.2 cm that was new compared with August 2012. New ill-defined groundglass opacity in the right upper lobe measuring approximately 2.8 cm in diameter.  TTE 1/2 Left ventricle: The cavity size was normal. Wall thickness was increased in a pattern of mild LVH. Systolic function was vigorous. The estimated ejection fraction was in the range of 65% to 70%. Wall motion was normal; there were no regional wall motion abnormalities. Doppler parameters are consistent with abnormal left ventricular relaxation (grade 1 diastolic dysfunction). - Pulmonary arteries: Systolic pressure was mildly to moderately increased. PA peak pressure: 41mm Hg (S). - Pericardium, extracardiac: A small pericardial effusion was identified.  Overnight Comfortable on NP 4lpm> sats 98%  Vital Signs: Temp:  [96.5 F (35.8 C)-97.7 F (36.5 C)] 97.7 F (36.5 C) (01/03 1548) Pulse Rate:  [82-102] 90  (01/03 1916) Resp:  [14-19] 15  (01/03 1916) BP: (127-156)/(49-67) 154/62 mmHg (01/03 1548) SpO2:  [98 %-100 %] 98 % (01/03 1916) Weight:  [220 lb 7.4 oz (100 kg)] 220 lb 7.4 oz (100 kg) (01/03 0500) I/O last 3 completed shifts: In: 1557 [P.O.:1257; I.V.:200; IV Piggyback:100] Out: 3152 [Urine:3150; Stool:2]  Physical Examination: General:  Obese ill-appearing woman in bed, dyspneic with any movement Neuro:  Awake, alert, follows commands, moves all ext   HEENT:  OP clear, PERRL Neck: mild JVD, mild UA noise Cardiovascular:  Tachy, regular,  Lungs:  Very distant, B exp wheezes Abdomen:  Obese, distended, mildly tender Musculoskeletal:  1+ edema Skin:  No rash  Labs and Imaging:    Lab 03/28/11 0500 03/27/11 0500 03/26/11 0525  NA 142 138 139  K 5.3* 4.5 4.7  CL 108 104 106  CO2 18* 16* 16*  BUN 134* 129* 118*  CREATININE 6.12* 6.87* 6.54*  GLUCOSE 214* 97 109*    Lab 03/27/11 0500 03/26/11 0525 03/25/11 2040  HGB 9.8* 9.4* 9.6*  HCT 29.8* 29.1* 29.9*  WBC 9.3 10.4 11.2*  PLT 150 146* 145*     Assessment and Plan: 1. Acute on chronic resp failure due to GOLD3-4 COPD, obesity with OSA/OHS, possible radiation-induced restrictive lung disease. Also with stress NSTEMI and probable total body volume overload, PAH.  - corticosteroids - bronchodilators - NIPPV qhs for her OSA if she can tolerate.  2. Abnormal CT scan chest  - right hilar region and right upper lobe groundglass opacity are very concerning for possible recurrence of malignancy. Other possibilities include areas of focal radiation pneumonitis or possible infection. Malignancy would appear to be most likely.  - We'll have to decide in conjunction with the patient and her family whether the risks of invasive tissue diagnosis are worth the benefits. The right hilar region could be biopsied by video fiberoptic bronchoscopy with endobronchial ultrasound. Given her acute status do not believe she can tolerate such a procedure.  would prefer to wait to see what her functional baseline will be after this episode has been treated and she is recovered - what will be her pulm and renal status?? Possibilities at that time include repeat CT scan in 3 months to look for interval change versus invasive biopsy. Given her profound debilitation even at baseline there may be little to gain by repeat biopsy. It is unclear whether she could tolerate more chemotherapy or radiation therapy but we could review this with her primary oncologist Dr Pete Glatter in [place].   3. Possible CAP - she came from home, no recent hospitalizations. Should be covered for CAP, completed 8 total abx 1/2  4. Acute renal failure - being followed by Dr Arrie Aran, now non-oliguric, hopefully will regain function - should not be on enoxaparin w ARF >> change to heparin -note HHC03 may be contributing to sob  Best practices / Disposition: -->SDU status under [facility] -->full code -->heparin for DVT Px -->Protonix for GI Px   Sandrea Hughs, MD Pulmonary and Critical Care  Medicine [facility] Cell [phone]

## 2011-03-28 NOTE — Progress Notes (Signed)
I have examined the patient and reviewed the chart. I have spoken with Dr Johny Drilling with the [facility] hospitalist service who states that their hospital is full and there are many patients in holding in the ER. Therefore he is unable to accept this patient in transfer. I have called and notified Tasha Page (dauthter) who is very irate about her mother being at [facility] but understands that there is nothing furter I can do to facilitate this transfer.

## 2011-03-29 LAB — CBC
HCT: 27.9 % — ABNORMAL LOW (ref 36.0–46.0)
Hemoglobin: 9.1 g/dL — ABNORMAL LOW (ref 12.0–15.0)
MCH: 27 pg (ref 26.0–34.0)
MCHC: 32.6 g/dL (ref 30.0–36.0)
MCV: 82.8 fL (ref 78.0–100.0)
Platelets: 173 10*3/uL (ref 150–400)
RBC: 3.37 MIL/uL — ABNORMAL LOW (ref 3.87–5.11)
RDW: 15.7 % — ABNORMAL HIGH (ref 11.5–15.5)
WBC: 7.7 10*3/uL (ref 4.0–10.5)

## 2011-03-29 LAB — IRON AND TIBC
Iron: 143 ug/dL — ABNORMAL HIGH (ref 42–135)
Saturation Ratios: 65 % — ABNORMAL HIGH (ref 20–55)
TIBC: 219 ug/dL — ABNORMAL LOW (ref 250–470)
UIBC: 76 ug/dL — ABNORMAL LOW (ref 125–400)

## 2011-03-29 LAB — COMPREHENSIVE METABOLIC PANEL
ALT: 51 U/L — ABNORMAL HIGH (ref 0–35)
AST: 18 U/L (ref 0–37)
Albumin: 2.2 g/dL — ABNORMAL LOW (ref 3.5–5.2)
Alkaline Phosphatase: 51 U/L (ref 39–117)
BUN: 123 mg/dL — ABNORMAL HIGH (ref 6–23)
CO2: 25 mEq/L (ref 19–32)
Calcium: 7.1 mg/dL — ABNORMAL LOW (ref 8.4–10.5)
Chloride: 106 mEq/L (ref 96–112)
Creatinine, Ser: 4.99 mg/dL — ABNORMAL HIGH (ref 0.50–1.10)
GFR calc Af Amer: 9 mL/min — ABNORMAL LOW (ref >90)
GFR calc non Af Amer: 8 mL/min — ABNORMAL LOW (ref >90)
Glucose, Bld: 152 mg/dL — ABNORMAL HIGH (ref 70–99)
Potassium: 4.7 mEq/L (ref 3.5–5.1)
Sodium: 143 mEq/L (ref 135–145)
Total Bilirubin: 0.2 mg/dL — ABNORMAL LOW (ref 0.3–1.2)
Total Protein: 5 g/dL — ABNORMAL LOW (ref 6.0–8.3)

## 2011-03-29 LAB — FERRITIN: Ferritin: 356 ng/mL — ABNORMAL HIGH (ref 10–291)

## 2011-03-29 LAB — RETICULOCYTES
RBC.: 3.78 MIL/uL — ABNORMAL LOW (ref 3.87–5.11)
Retic Count, Absolute: 30.2 10*3/uL (ref 19.0–186.0)
Retic Ct Pct: 0.8 % (ref 0.4–3.1)

## 2011-03-29 LAB — GLUCOSE, CAPILLARY
Glucose-Capillary: 174 mg/dL — ABNORMAL HIGH (ref 70–99)
Glucose-Capillary: 147 mg/dL — ABNORMAL HIGH (ref 70–99)
Glucose-Capillary: 197 mg/dL — ABNORMAL HIGH (ref 70–99)
Glucose-Capillary: 147 mg/dL — ABNORMAL HIGH (ref 70–99)

## 2011-03-29 LAB — FOLATE: Folate: 11.6 ng/mL

## 2011-03-29 LAB — VITAMIN B12: Vitamin B-12: 900 pg/mL (ref 211–911)

## 2011-03-29 MED ORDER — PANTOPRAZOLE SODIUM 40 MG PO TBEC
40.0000 mg | DELAYED_RELEASE_TABLET | Freq: Two times a day (BID) | ORAL | Status: DC
  Administered 2011-03-30 – 2011-04-01 (×5): 40 mg via ORAL
  Filled 2011-03-29 (×4): qty 1

## 2011-03-29 MED ORDER — PREDNISONE 50 MG PO TABS
60.0000 mg | ORAL_TABLET | Freq: Every day | ORAL | Status: DC
  Administered 2011-03-30 – 2011-04-01 (×3): 60 mg via ORAL
  Filled 2011-03-29 (×4): qty 1

## 2011-03-29 MED ORDER — SODIUM BICARBONATE 650 MG PO TABS
650.0000 mg | ORAL_TABLET | Freq: Three times a day (TID) | ORAL | Status: DC
  Filled 2011-03-29 (×3): qty 1

## 2011-03-29 NOTE — Consult Note (Signed)
Name: Gabrielle Hudson MRN: [id number] DOB: 03-02-1941    LOS: 4  PCCM CONSULT NOTE  Reason for consultation: Acute on chronic resp failure, COPD, abnormal CT scan chest  Consulting MD: Arthor Captain, [facility]  History of Present Illness: 71 y/o wf with severe COPD, obstructive sleep apnea, and chronic respiratory failure diagnosed with stage IIIB squamous cell lung carcinoma in November 2010, treated with . She also has a history of colon cancer status post resection. She was admitted to [facility] on 12/26 with acute on chronic respiratory failure in the setting of apparent exacerbation of COPD. She exhibited bilateral basilar volume loss and possible infiltrates and was treated for a possible pneumonia with cefepime and levofloxacin. She also received steroids, bronchodilators, empiric diuretics and required BiPAP for respiratory support.  Evaluation was consistent with severe sepsis without shock. Course was complicated by a stress non-ST elevation MI. She developed acute oliguric renal failure (? From aggressive diuresis) and was transferred to [facility] for further care. Her renal failure is now nonoliguric and she is no longer requiring BiPAP. She has not required hemodialysis. CT scan of the chest performed on 03/26/11 showed bilateral small pleural effusions with associated volume loss, severe emphysema, and a right hilar soft tissue density measuring approximately 2.6 x 2.2 cm that was new compared with August 2012. Also noted was an ill-defined groundglass opacity in the right upper lobe measuring approximately 2.8 cm in diameter.     Cultures: MRSA screen 12/31 > neg Urine 1/1 >> negative  Antibiotics: Cefepime Duke Salvia) 12/26 >> 12/31 levaquin Duke Salvia) 12/26 >>12/31 vanco 12/31 >> 1/2 Zosyn 12/31 >> 1/2  Tests / Events: CT scan chest 1/1 >> bilateral small pleural effusions with associated volume loss, severe emphysema, and a right hilar soft tissue density measuring approximately 2.6 x  2.2 cm that was new compared with August 2012. New ill-defined groundglass opacity in the right upper lobe measuring approximately 2.8 cm in diameter.   TTE 1/2 Left ventricle: The cavity size was normal. Wall thickness was increased in a pattern of mild LVH. Systolic function was vigorous. The estimated ejection fraction was in the range of 65% to 70%. Wall motion was normal; there were no regional wall motion abnormalities. Doppler parameters are consistent with abnormal left ventricular relaxation (grade 1 diastolic dysfunction). Pulmonary arteries: Systolic pressure was mildly to moderately increased. PA peak pressure: 41mm Hg (S). Pericardium, extracardiac: A small pericardial effusion was identified.  Overnight   Vital Signs: Temp:  [97.5 F (36.4 C)-97.8 F (36.6 C)] 97.8 F (36.6 C) (01/04 1127) Pulse Rate:  [82-98] 86  (01/04 1127) Resp:  [14-17] 16  (01/04 1127) BP: (138-164)/(51-62) 144/60 mmHg (01/04 1127) SpO2:  [98 %-100 %] 98 % (01/04 1127) Weight:  [221 lb 12.5 oz (100.6 kg)] 221 lb 12.5 oz (100.6 kg) (01/04 0034) I/O last 3 completed shifts: In: 1812 [P.O.:1122; I.V.:690] Out: 3656 [Urine:3650; Stool:6]  Physical Examination: General:  Obese ill-appearing woman in bed, dyspneic with any movement Neuro:  Awake, alert, follows commands, moves all ext   HEENT:  OP clear, PERRL Neck: mild JVD, mild UA noise Cardiovascular:  Tachy, regular,  Lungs:  Very distant, B exp wheezes Abdomen:  Obese, distended, mildly tender Musculoskeletal:  1+ edema Skin:  No rash  Labs and Imaging:    Lab 03/29/11 0500 03/28/11 0500 03/27/11 0500  NA 143 142 138  K 4.7 5.3* 4.5  CL 106 108 104  CO2 25 18* 16*  BUN 123* 134* 129*  CREATININE  4.99* 6.12* 6.87*  GLUCOSE 152* 214* 97    Lab 03/29/11 0500 03/27/11 0500 03/26/11 0525  HGB 9.1* 9.8* 9.4*  HCT 27.9* 29.8* 29.1*  WBC 7.7 9.3 10.4  PLT 173 150 146*    Assessment and Plan: 1. Acute on chronic resp failure due to  GOLD3-4 COPD, obesity with OSA/OHS, possible radiation-induced restrictive lung disease. Also with stress NSTEMI and probable total body volume overload, PAH.  - corticosteroids (decadron-->solumedrol 40 Q8 started 1/3)--> would change to prednisone and taper; ? Whether she needs low dose chronic pred - bronchodilators - NIPPV qhs for her OSA if she can tolerate. - Guaifenesin, protonix BID  2. Abnormal CT scan chest  - right hilar region and right upper lobe ground glass opacity are very concerning for possible recurrence of malignancy. Other possibilities include areas of focal radiation pneumonitis or possible infection. Malignancy would appear to be most likely.  - We'll have to decide in conjunction with the patient and her family whether the risks of invasive tissue diagnosis are worth the benefits. The right hilar region could be biopsied by video fiberoptic bronchoscopy with endobronchial ultrasound. Given her acute status do not believe she can tolerate such a procedure.  Would prefer to wait to see what her functional baseline will be after this episode has been treated and she is recovered - what will be her pulm and renal status?? Possibilities at that time include repeat CT scan in 3 months to look for interval change versus invasive biopsy. Given her profound debilitation even at baseline there may be little to gain by repeat biopsy. It is unclear whether she could tolerate more chemotherapy or radiation therapy but we could review this with her primary oncologist Dr Pete Glatter in [place]. She is far too weak undergo any invasive procedure.   3. Possible CAP - she came from home, no recent hospitalizations. Should be covered for CAP, completed 8 days total abx 1/2  4. Acute renal failure  Lab 03/29/11 0500 03/28/11 0500 03/27/11 0500 03/26/11 0525 03/25/11 2040  CREATININE 4.99* 6.12* 6.87* 6.54* 6.49*  - being followed by Dr Arrie Aran, now non-oliguric, hopefully will regain function -  should not be on enoxaparin w ARF >> changed to heparin - note HHC03 may be contributing to sob  Best practices / Disposition: -->SDU status under [facility] -->full code -->heparin for DVT Px -->Protonix for GI Px -->Dispo--pt wants to go home but unlikely that she could provide self care given current status.  May need short term Rehab at SNF vs. permanent placement??  CCM will sign off, please call if we ban help in any way.    Canary Brim, NP-C [place] [facility] Pgr: [phone]   Attending Addendum:  I have seen the patient, discussed the issues, test results and plans with B. Veleta Miners, NP. I agree with the Assessment and Plans as outlined above.   Levy Pupa, MD, PhD 03/29/2011, 12:47 PM [facility] [phone] or if no answer [phone]

## 2011-03-29 NOTE — Progress Notes (Signed)
Pt situated and oriented to 5525. Placed on 2L humidified oxygen. Given call bell. Pt stable. Will continue to monitor.

## 2011-03-29 NOTE — Progress Notes (Signed)
[facility]   Subjective: 71 year old female who was initially admitted to [facility] 03/20/2011 of progressive dyspnea. At time of presentation she appeared septic with a white count of 33,000 and was felt to either have a severe COPD exacerbation with underlying tracheobronchitis or pneumonia. She also was found to have acute renal failure with a creatinine of 2 and a mildly elevated BUN. Her baseline creatinine is not documented. Her treatment at [facility] consisted of broad-spectrum antibiotic therapy and at some point there was felt to be a degree of volume overload apparently for she was started on Lasix but she develop progressive renal failure and increasing shortness of breath. In addition she had mild elevation in her cardiac isoenzymes with a peak troponin I of 0.54. She underwent an echocardiogram at that facility that demonstrated a hyperdynamic EF of 70-75% but no systolic dysfunction or diastolic dysfunction. She was seen by nephrologist Dr. Hyman Hopes at [place] because her creatinine peaked greater than 6. His recommendation was to transfer to a hospital that can initiate hemodialysis. She was subsequently transferred to [facility] on 03/25/2011.  Upon arrival to [facility] patient was respiratory distress. She was on 3 L nasal cannula oxygen. Her most recent white cell count from [place] was 14,000. She denied chest pain. She was normotensive.  Continues to improve. Endorsing that her breathing is nearly at baseline although she is still somewhat more short of breath and she has been prior to this most recent admission. She informs [place] that she was told by her kidney doctor she would likely be able to discharge home this weekend. We discussed with her the unlikelihood of this given the fact she is just now starting to get out of bed this a.m. When she did get out of bed to utilize the bedside commode she became very dyspneic and fatigued.  Objective: Vital signs  in last 24 hours: Temp:  [97.5 F (36.4 C)-97.8 F (36.6 C)] 97.8 F (36.6 C) (01/04 1127) Pulse Rate:  [82-90] 86  (01/04 1127) Resp:  [14-17] 16  (01/04 1127) BP: (138-164)/(51-62) 144/60 mmHg (01/04 1127) SpO2:  [98 %-100 %] 98 % (01/04 1147) Weight:  [100.6 kg (221 lb 12.5 oz)] 221 lb 12.5 oz (100.6 kg) (01/04 0034) Weight change: 0.6 kg (1 lb 5.2 oz) Last BM Date: 03/28/11  Intake/Output from previous day: 01/03 0701 - 01/04 0700 In: 1485 [P.O.:885; I.V.:600] Out: 2306 [Urine:2300; Stool:6] Intake/Output this shift: Total I/O In: -  Out: 801 [Urine:800; Stool:1]  General appearance: cooperative, moderate distress and improved mentation Resp: Remain coarse to auscultation bilaterally but no wheezing, persistent midfield and basilar rhonchi present. Previous tachypnea has resolved and respiratory rate only increases with attempts to verbalize, at rest her respiratory effort is nonlabored she is on nasal cannula oxygen at 4 L per minute maintaining O2 saturations between 97 and 99% and since our initial examination this morning she has further been weaned to 2 L of nasal cannula oxygen. Cardio: regular rate and rhythm, S1, S2 normal, no murmur, click, rub or gallop GI: soft, non-tender; bowel sounds normal; no masses,  no organomegaly, by mouth intake is poor Extremities: extremities normal, atraumatic, no cyanosis or edema Neurologic: Awakens but relatively lethargic, seems to be oriented at least to name person and place, exam is nonfocal and she is moving all extremities x4 strength about 4/5  Lab Results:  Basename 03/29/11 0500 03/27/11 0500  WBC 7.7 9.3  HGB 9.1* 9.8*  HCT 27.9* 29.8*  PLT 173  150   BMET  Basename 03/29/11 0500 03/28/11 0500  NA 143 142  K 4.7 5.3*  CL 106 108  CO2 25 18*  GLUCOSE 152* 214*  BUN 123* 134*  CREATININE 4.99* 6.12*  CALCIUM 7.1* 7.7*    Studies/Results: No results found.  Medications:  I have reviewed the patient's current  medications. Scheduled:    . albuterol  2.5 mg Nebulization QID  . antiseptic oral rinse  15 mL Mouth Rinse q12n4p  . aspirin  325 mg Oral Daily  . calcium-vitamin D  1 tablet Oral TID  . chlorhexidine  15 mL Mouth Rinse BID  . diltiazem  30 mg Oral Q8H  . guaiFENesin  600 mg Oral BID  . heparin subcutaneous  5,000 Units Subcutaneous Q8H  . insulin aspart  0-9 Units Subcutaneous TID WC  . ipratropium  0.5 mg Nebulization QID  . levothyroxine  100 mcg Oral QAC breakfast  . pantoprazole  40 mg Oral BID AC  . predniSONE  60 mg Oral Q breakfast  . sodium chloride  3 mL Intravenous Q12H  . white petrolatum      . DISCONTD: methylPREDNISolone (SOLU-MEDROL) injection  40 mg Intravenous Q8H  . DISCONTD: pantoprazole  40 mg Oral Q1200  . DISCONTD: sodium bicarbonate  650 mg Oral TID  . DISCONTD: sodium bicarbonate  650 mg Oral TID    Assessment/Plan:  Principal Problem:  *AKI (acute kidney injury)-non-oliguric/ Metabolic acidosis True baseline creatinine unknown. She presented to [facility] with a creatinine of 2 but also elevated BUN which makes [place] suspicious she may have a somewhat lower baseline creatinine although likely has at least chronic kidney disease stage II to 3 at baseline. Currently her creatinine clearance is consistent with stage V chronic kidney disease which may be reversible. Nephrology is following. Patient has excellent urinary output with utilization of IV Lasix. Her clinical exam from a pulmonary standpoint was not consistent with volume overload therefore the nephrologist has discontinued her Lasix. Her creatinine has continued to trend downward and is now less than 5 and her metabolic acidosis has resolved and therefore the nephrologist has discontinued all bicarbonate infusions and/or oral bicarbonate solutions. It appears that at this point she will avoid hemodialysis this admission. Because of poor creatinine clearance critical care medicine recommends  discontinuing Lovenox in favor of heparin subcutaneous.  Active Problems:  Decompensated COPD with exacerbation (chronic obstructive pulmonary disease) Although she is not wheezing today she has had intermittent wheezing this admission but exam at this time is more consistent with either bronchitis or pneumonia process that is evolving. As a precaution she has been placed on empiric broad-spectrum antibiotic therapy prior to this admission and is currently on Zosyn and vancomycin with pharmacy managing his medications. We will continue supportive care with nebulizers and oxygen. An ABG has been obtained today shows only mild worsening of her hypoxemia but significant hypocarbia consistent with compensatory mechanism for her metabolic acidosis from her renal failure. Pulmonary critical care medicine recommends discontinuing Solu-Medrol 40 mg IV every 8 hours in favor of oral prednisone. It is likely she will benefit from the addition of chronic steroids after discharge.   Hypocalcemia RESOLVED  Today's calcium is 7.7 with an albumin of 2.2 which is a corrected calcium of 9.1   Acute hypoxic respiratory failure/Community acquired pneumonia Patient is normally on 2 L of oxygen at home and was experiencing progressive dyspnea at home prior to this admission. Her previous tachypnea and persistent sensation of shortness  of breath which is likely a compensatory mechanism due to her underlying lactic acidosis and renal failure is improving. Pulmonary medicine did evaluate the patient yesterday. They have also reviewed her serial CT scans of the chest including the one from this admission and have discussed with the radiologist. It appears as if she has 2 issues going on from a pulmonary standpoint. One is concern over possible recurrence of her lung cancer and the second is an acute infectious versus an inflammatory process causing a groundglass appearance of the lungs. Recommendation is to treat the second issue  is an acute infection and continue antibiotics for a total of 8 days.    NSTEMI (non-ST elevated myocardial infarction) Patient presented with hypotension and mild elevation of troponin I at [facility] with nonischemic EKG. Since arrival [facility] her troponin I's have been normal although her MBs have remained elevated which is more consistent with acute renal failure. We will continue to follow.  Abnormal CT scan of lung/ H/O: lung cancer Patient has a documented history of right upper lobe lung cancer treated in 2010 with surgery, chemotherapy and radiation. Patient is unsure of the type of tissue diagnosis she had. We have requested records from [place] including a hard copy of any CT scans and x-ray on disc. CT of the chest this admission showed abnormalities in the right upper lobe (radiation pneumonitis versus inflammatory change) and hilum (2.6 x 2.2 cm mass) which are likely due to scarring from previous treatments for cancer. Pulmonology is concerned over possible evolving recurrence of her pulmonary cancer. Intercurrent physiologic state of decompensation she is not a candidate at the present time for invasive diagnostic procedure such as fiberoptic bronchoscopy. Because of her underlying lung disease she is likely not a candidate for general anesthesia and surgical resection of any recurrent pulmonary mass.  Leukocytosis Presented to [facility] with significant leukocytosis which is now resolved with appropriate initiation of broad-spectrum empiric antibiotic therapy. May also be reactive related to acute renal failure.   Anemia Etiology unclear. Her renal failure appears to be acute and therefore the anemia does not appear to be mediated at this point from renal dysfunction. There also do not appear to be any signs of acute GI bleeding. SPEP and UPEP are pending. Consider checking an anemia panel and/or stool for blood. Haptoglobin is normal so there is no evidence of  hemolysis.   NSVT (nonsustained ventricular tachycardia) Had several occurrences of this arrhythmia at [facility] in the setting of potassium between 3.7 and 3.8. Magnesium level unknown at that time. Likely due to stress and recent hypotension in the setting of acute evolving renal failure etc. A  2-D echocardiogram to evaluate cardiac anatomy has been completed this admission and shows hyperdynamic ejection fraction of 65-70% with no regional wall motion abnormalities and other parameters consistent with grade 1 diastolic dysfunction. She has mild pulmonary hypertension 41 mmHg and there is a question of a small pericardial effusion there is also a mildly elevated LDL T. gradient. Please note TSH was low at 0.018 on 03/25/2011. She will need a repeat TSH with free T4 and T3 in 2-4 weeks after discharge.   Hypothyroidism TSH is low at 0.018. Current dose of levothyroxine is 100 mcg daily. This dose was lowered from 175 mcg after the abnormally low TSH was discovered. Hopefully the low TSH is an acute reactant. Recommend repeating TSH along with free T4 and T3 prior to discharge and several weeks after discharge.   OSA (  obstructive sleep apnea) During daytime hours has been stable off CPAP. She's actually hypocarbic at this point. Unclear if she utilizes CPAP at home or only the oxygen she is on chronically. Monitor her closely to see if she would benefit from at least nocturnal application of CPAP. This idea is also supported by pulmonary medicine.   Transaminitis Nonobstructive pattern and likely consistent with shock liver from earlier hypotension. We'll repeat c met in the morning.  Disposition We'll transfer to telemetry unit. I have received a call from the patient's daughter Edwena Blow ([phone]) who has requested that her mother be transferred to [facility] to continue her care. The daughter informs me that her initial desire prior to the patient being transferred from  [facility] was for her mother to go to [facility]. She further explains that it is an economic hardship for her to drive back and forth to [place] to assist in the management of her mother's care, she relates she is basically the primary caretaker and these issues. She also endorses she doesn't have many pay time off days and it would be easier for her to leave work briefly to see her mother and the [place] area. On 03/28/2011 Dr. Butler Denmark did contact [facility] and spoke with the hospitalist on call. Unfortunately the hospital is full and there actually holding patient's in the ER and are diverting future traffic to other facilities and therefore they would not be able to accept this patient in admission. The patient's daughter Mickie Bail was notified by Dr. Butler Denmark of the situation.   LOS: 4 days   Junious Silk, ANP pager [phone]  [facility]-team 8 [email or web address] Password: [email or web address]  03/29/2011, 2:27 PM   I have examined the patient and reviewed the chart and agree with the above note.   Calvert Cantor MD [phone]

## 2011-03-29 NOTE — Progress Notes (Signed)
Patient ID: Gabrielle Hudson, female   DOB: 1940/09/18, 71 y.o.   MRN: [id number] S:SOB feeling better.   O:BP 138/59  Pulse 90  Temp(Src) 97.5 F (36.4 C) (Axillary)  Resp 17  Ht 5\' 4"  (1.626 m)  Wt 221 lb 12.5 oz (100.6 kg)  BMI 38.07 kg/m2  SpO2 98%  Intake/Output Summary (Last 24 hours) at 03/29/11 0805 Last data filed at 03/29/11 0600  Gross per 24 hour  Intake   1485 ml  Output   2306 ml  Net   -821 ml   Weight change: 1 lb 5.2 oz (0.6 kg) WUJ:WJXBJ WF in mild distress when changing positions.  CVS:no rub Resp: few scattered rhonchi and exp wheezes YNW:GNFAO, o/w benign Ext:no edema   Lab 03/29/11 0500 03/28/11 0500 03/27/11 0500 03/26/11 0525 03/25/11 2040  NA 143 142 138 139 135  K 4.7 5.3* 4.5 4.7 4.7  CL 106 108 104 106 104  CO2 25 18* 16* 16* 16*  GLUCOSE 152* 214* 97 109* 149*  BUN 123* 134* 129* 118* 112*  CREATININE 4.99* 6.12* 6.87* 6.54* 6.49*  ALB -- -- -- -- --  CALCIUM 7.1* 7.7* 8.1* 7.6* 7.3*  PHOS -- 4.7* -- -- 5.4*  AST 18 24 -- 61* 64*  ALT 51* 62* -- 60* 54*   Liver Function Tests:  Lab 03/29/11 0500 03/28/11 0500 03/26/11 0525  AST 18 24 61*  ALT 51* 62* 60*  ALKPHOS 51 60 61  BILITOT 0.2* 0.2* 0.2*  PROT 5.0* 5.4* 5.1*  ALBUMIN 2.2* 2.2* 2.0*   No results found for this basename: LIPASE:3,AMYLASE:3 in the last 168 hours  Lab 03/25/11 2120  AMMONIA <10*   CBC:  Lab 03/29/11 0500 03/27/11 0500 03/26/11 0525 03/25/11 2040  WBC 7.7 9.3 10.4 --  NEUTROABS -- -- -- 10.2*  HGB 9.1* 9.8* 9.4* --  HCT 27.9* 29.8* 29.1* --  MCV 82.8 82.5 84.3 85.2  PLT 173 150 146* --   Cardiac Enzymes:  Lab 03/26/11 2015 03/26/11 1400 03/26/11 0525 03/25/11 2150  CKTOTAL 74 85 105 124  CKMB 10.1* 11.1* 12.5* 14.0*  CKMBINDEX -- -- -- --  TROPONINI <0.30 <0.30 <0.30 <0.30   CBG:  Lab 03/28/11 2148 03/28/11 1701 03/28/11 1230 03/28/11 0819 03/27/11 2125  GLUCAP 174* 139* 150* 145* 215*    Iron Studies: No results found for this basename:  IRON,TIBC,TRANSFERRIN,FERRITIN in the last 72 hours Studies/Results: No results found. ECHO 03/27/11: EF 60-65%, mild LVH. Mild Pulm HTN.     Marland Kitchen albuterol  2.5 mg Nebulization QID  . antiseptic oral rinse  15 mL Mouth Rinse q12n4p  . aspirin  325 mg Oral Daily  . calcium-vitamin D  1 tablet Oral TID  . chlorhexidine  15 mL Mouth Rinse BID  . diltiazem  30 mg Oral Q8H  . guaiFENesin  600 mg Oral BID  . heparin subcutaneous  5,000 Units Subcutaneous Q8H  . insulin aspart  0-9 Units Subcutaneous TID WC  . ipratropium  0.5 mg Nebulization QID  . levothyroxine  100 mcg Oral QAC breakfast  . methylPREDNISolone (SOLU-MEDROL) injection  40 mg Intravenous Q8H  . pantoprazole  40 mg Oral Q1200  . sodium bicarbonate  650 mg Oral TID  . sodium chloride  3 mL Intravenous Q12H  . white petrolatum      . DISCONTD: dexamethasone  4 mg Intravenous Q6H    BMET    Component Value Date/Time   NA 143 03/29/2011 0500  K 4.7 03/29/2011 0500   CL 106 03/29/2011 0500   CO2 25 03/29/2011 0500   GLUCOSE 152* 03/29/2011 0500   BUN 123* 03/29/2011 0500   CREATININE 4.99* 03/29/2011 0500   CALCIUM 7.1* 03/29/2011 0500   GFRNONAA 8* 03/29/2011 0500   GFRAA 9* 03/29/2011 0500   CBC    Component Value Date/Time   WBC 7.7 03/29/2011 0500   RBC 3.37* 03/29/2011 0500   HGB 9.1* 03/29/2011 0500   HCT 27.9* 03/29/2011 0500   PLT 173 03/29/2011 0500   MCV 82.8 03/29/2011 0500   MCH 27.0 03/29/2011 0500   MCHC 32.6 03/29/2011 0500   RDW 15.7* 03/29/2011 0500   LYMPHSABS 0.6* 03/25/2011 2040   MONOABS 0.4 03/25/2011 2040   EOSABS 0.0 03/25/2011 2040   BASOSABS 0.0 03/25/2011 2040     Assessment/Plan: 1. ARF- continues to be nonoliguric with adequate urine production over the last 24 hours. Reviewed previous hospital records, Cr 0.6 in 2010. Today improved Cr, improved bicarb, stable BUN, no evidence of fluid overload. Normal EF on ECHO. Will d/c bicarb drip.  No indication for dialysis as renal function continues to improve.  2. SOB-   Improved. Decompensated COPD +/- PNA. Is not related to volume/pulm edema.   3. CAP- Improved. Afebrile with normal WBC.  completed a 7 day course IV Abx (maxipime and Levaquin --> Vanc and Zosyn)  4. HTN- stable 5. Elevated LFT's-likely due to hypotension. Trending down.  6. Anemia- Normal haptoglobin. No evidence of hemolysis or active bleeding. Proceed with w/u including SPEP and UPEP per primary team.  7. NSTEMI- stable ECHO no regional wall motion abnormality.  8. Enlarging hilar mass- PCCM to see pt.  Gabrielle Hudson,Gabrielle Hudson  I have seen and examined this patient and agree with plan as outlined by Dr. Armen Pickup.  Will d/c bicarb drip and follow uop and daily Scr. Wadie Mattie A,MD 03/29/2011 9:40 AM  8:05 AM 03/27/10

## 2011-03-29 NOTE — Progress Notes (Signed)
Utilization Review Completed.Kathyann Spaugh T1/06/2011   

## 2011-03-30 LAB — RENAL FUNCTION PANEL
Albumin: 2.6 g/dL — ABNORMAL LOW (ref 3.5–5.2)
BUN: 115 mg/dL — ABNORMAL HIGH (ref 6–23)
CO2: 24 mEq/L (ref 19–32)
Calcium: 7.7 mg/dL — ABNORMAL LOW (ref 8.4–10.5)
Chloride: 109 mEq/L (ref 96–112)
Creatinine, Ser: 3.92 mg/dL — ABNORMAL HIGH (ref 0.50–1.10)
GFR calc Af Amer: 12 mL/min — ABNORMAL LOW (ref >90)
GFR calc non Af Amer: 11 mL/min — ABNORMAL LOW (ref >90)
Glucose, Bld: 76 mg/dL (ref 70–99)
Phosphorus: 4.1 mg/dL (ref 2.3–4.6)
Potassium: 4.4 mEq/L (ref 3.5–5.1)
Sodium: 145 mEq/L (ref 135–145)

## 2011-03-30 LAB — GLUCOSE, CAPILLARY
Glucose-Capillary: 161 mg/dL — ABNORMAL HIGH (ref 70–99)
Glucose-Capillary: 153 mg/dL — ABNORMAL HIGH (ref 70–99)
Glucose-Capillary: 169 mg/dL — ABNORMAL HIGH (ref 70–99)

## 2011-03-30 LAB — CBC
HCT: 30.9 % — ABNORMAL LOW (ref 36.0–46.0)
Hemoglobin: 10.1 g/dL — ABNORMAL LOW (ref 12.0–15.0)
MCH: 27.7 pg (ref 26.0–34.0)
MCHC: 32.7 g/dL (ref 30.0–36.0)
MCV: 84.7 fL (ref 78.0–100.0)
Platelets: 197 10*3/uL (ref 150–400)
RBC: 3.65 MIL/uL — ABNORMAL LOW (ref 3.87–5.11)
RDW: 16 % — ABNORMAL HIGH (ref 11.5–15.5)
WBC: 10.5 10*3/uL (ref 4.0–10.5)

## 2011-03-30 LAB — COMPLEMENT, TOTAL: Compl, Total (CH50): >60 U/mL — ABNORMAL HIGH (ref 31–60)

## 2011-03-30 MED ORDER — LEVOFLOXACIN 750 MG PO TABS
750.0000 mg | ORAL_TABLET | Freq: Once | ORAL | Status: AC
  Administered 2011-03-30: 750 mg via ORAL
  Filled 2011-03-30: qty 1

## 2011-03-30 MED ORDER — SODIUM CHLORIDE 0.45 % IV SOLN
INTRAVENOUS | Status: AC
Start: 2011-03-30 — End: 2011-03-31
  Administered 2011-03-30: 19:00:00 via INTRAVENOUS

## 2011-03-30 MED ORDER — LEVOFLOXACIN 500 MG PO TABS
500.0000 mg | ORAL_TABLET | ORAL | Status: DC
  Filled 2011-03-30: qty 1

## 2011-03-30 NOTE — Progress Notes (Signed)
Patient ID: Gabrielle Hudson, female   DOB: Dec 02, 1940, 71 y.o.   MRN: [id number]  S:  Got up and walked in room with PT today.  Eating OK, no N/V.  Good UOP yest with 2850 out, not on any diuretics.    O:BP 135/77  Pulse 94  Temp(Src) 96.9 F (36.1 C) (Axillary)  Resp 20  Ht 5\' 4"  (1.626 m)  Wt 102.3 kg (225 lb 8.5 oz)  BMI 38.71 kg/m2  SpO2 97%  Intake/Output Summary (Last 24 hours) at 03/30/11 1627 Last data filed at 03/30/11 1458  Gross per 24 hour  Intake    243 ml  Output   1602 ml  Net  -1359 ml   Weight change: 1.7 kg (3 lb 12 oz) Gen: alert, laying at 30deg, no distress, calm CVS: no rub, regular Resp: occ rhonchi, o/w clear Abd: soft, obese nontender Ext: no edema   Lab 03/30/11 0500 03/29/11 0500 03/28/11 0500 03/27/11 0500 03/26/11 0525 03/25/11 2040  NA 145 143 142 138 139 135  K 4.4 4.7 5.3* 4.5 4.7 4.7  CL 109 106 108 104 106 104  CO2 24 25 18* 16* 16* 16*  GLUCOSE 76 152* 214* 97 109* 149*  BUN 115* 123* 134* 129* 118* 112*  CREATININE 3.92* 4.99* 6.12* 6.87* 6.54* 6.49*  ALB -- -- -- -- -- --  CALCIUM 7.7* 7.1* 7.7* 8.1* 7.6* 7.3*  PHOS 4.1 -- 4.7* -- -- 5.4*  AST -- 18 24 -- 61* 64*  ALT -- 51* 62* -- 60* 54*   Liver Function Tests:  Lab 03/30/11 0500 03/29/11 0500 03/28/11 0500 03/26/11 0525  AST -- 18 24 61*  ALT -- 51* 62* 60*  ALKPHOS -- 51 60 61  BILITOT -- 0.2* 0.2* 0.2*  PROT -- 5.0* 5.4* 5.1*  ALBUMIN 2.6* 2.2* 2.2* --   No results found for this basename: LIPASE:3,AMYLASE:3 in the last 168 hours  Lab 03/25/11 2120  AMMONIA <10*   CBC:  Lab 03/30/11 0600 03/29/11 0500 03/27/11 0500 03/26/11 0525 03/25/11 2040  WBC 10.5 7.7 9.3 -- --  NEUTROABS -- -- -- -- 10.2*  HGB 10.1* 9.1* 9.8* -- --  HCT 30.9* 27.9* 29.8* -- --  MCV 84.7 82.8 82.5 84.3 85.2  PLT 197 173 150 -- --   Cardiac Enzymes:  Lab 03/26/11 2015 03/26/11 1400 03/26/11 0525 03/25/11 2150  CKTOTAL 74 85 105 124  CKMB 10.1* 11.1* 12.5* 14.0*  CKMBINDEX -- -- -- --   TROPONINI <0.30 <0.30 <0.30 <0.30   CBG:  Lab 03/30/11 1332 03/29/11 1627 03/29/11 1126 03/29/11 0804 03/28/11 2148  GLUCAP 161* 147* 197* 147* 174*    Iron Studies:   Basename 03/29/11 1000  IRON 143*  TIBC 219*  TRANSFERRIN --  FERRITIN 356*   Studies/Results: No results found. ECHO 03/27/11: EF 60-65%, mild LVH. Mild Pulm HTN.     Marland Kitchen albuterol  2.5 mg Nebulization QID  . antiseptic oral rinse  15 mL Mouth Rinse q12n4p  . aspirin  325 mg Oral Daily  . calcium-vitamin D  1 tablet Oral TID  . chlorhexidine  15 mL Mouth Rinse BID  . diltiazem  30 mg Oral Q8H  . guaiFENesin  600 mg Oral BID  . heparin subcutaneous  5,000 Units Subcutaneous Q8H  . insulin aspart  0-9 Units Subcutaneous TID WC  . ipratropium  0.5 mg Nebulization QID  . levothyroxine  100 mcg Oral QAC breakfast  . pantoprazole  40 mg Oral  BID AC  . predniSONE  60 mg Oral Q breakfast  . sodium chloride  3 mL Intravenous Q12H    BMET    Component Value Date/Time   NA 145 03/30/2011 0500   K 4.4 03/30/2011 0500   CL 109 03/30/2011 0500   CO2 24 03/30/2011 0500   GLUCOSE 76 03/30/2011 0500   BUN 115* 03/30/2011 0500   CREATININE 3.92* 03/30/2011 0500   CALCIUM 7.7* 03/30/2011 0500   GFRNONAA 11* 03/30/2011 0500   GFRAA 12* 03/30/2011 0500   CBC    Component Value Date/Time   WBC 10.5 03/30/2011 0600   RBC 3.65* 03/30/2011 0600   HGB 10.1* 03/30/2011 0600   HCT 30.9* 03/30/2011 0600   PLT 197 03/30/2011 0600   MCV 84.7 03/30/2011 0600   MCH 27.7 03/30/2011 0600   MCHC 32.7 03/30/2011 0600   RDW 16.0* 03/30/2011 0600   LYMPHSABS 0.6* 03/25/2011 2040   MONOABS 0.4 03/25/2011 2040   EOSABS 0.0 03/25/2011 2040   BASOSABS 0.0 03/25/2011 2040     Assessment/Plan: 1. ARF- Cr. 0.6 in 2010.  Creat 6.5 to 3.9 so far in the hospital, improving daily.  Suspect AKI due to ATN.  Will give some IVF's overnight.   2. SOB-  Improved. Decompensated COPD +/- PNA. Not related to volume/pulm edema.   3. CAP- improved 4. HTN- stable  5. Anemia-  Normal haptoglobin. No evidence of hemolysis or active bleeding. Proceed with w/u including SPEP and UPEP per primary team.  6. NSTEMI- stable ECHO no regional wall motion abnormality.  7. Enlarging hilar mass- per primary  Vinson Moselle, MD [facility] [phone] cell 03/30/2011, 4:31 PM

## 2011-03-30 NOTE — Progress Notes (Signed)
Subjective: Pt mentions that her breathing is a little better.  Denies any acute issues overnight.   Objective: Filed Vitals:   03/30/11 0824 03/30/11 1130 03/30/11 1400 03/30/11 1502  BP:    135/77  Pulse:    94  Temp:    96.9 F (36.1 C)  TempSrc:    Axillary  Resp:    20  Height:      Weight:      SpO2: 98% 96% 95% 97%   Weight change: 1.7 kg (3 lb 12 oz)  Intake/Output Summary (Last 24 hours) at 03/30/11 1839 Last data filed at 03/30/11 1458  Gross per 24 hour  Intake    243 ml  Output   1602 ml  Net  -1359 ml    General: Alert, awake, oriented x3, in no acute distress.  HEENT: No bruits, no goiter.  Heart: Regular rate and rhythm, without murmurs, rubs, gallops.  Lungs: exp wheezes mild bases> apices  Abdomen: Soft, nontender, nondistended, positive bowel sounds.  Neuro: Grossly intact, nonfocal.   Lab Results:  Basename 03/30/11 0500 03/29/11 0500 03/28/11 0500  NA 145 143 --  K 4.4 4.7 --  CL 109 106 --  CO2 24 25 --  GLUCOSE 76 152* --  BUN 115* 123* --  CREATININE 3.92* 4.99* --  CALCIUM 7.7* 7.1* --  MG -- -- --  PHOS 4.1 -- 4.7*    Basename 03/30/11 0500 03/29/11 0500 03/28/11 0500  AST -- 18 24  ALT -- 51* 62*  ALKPHOS -- 51 60  BILITOT -- 0.2* 0.2*  PROT -- 5.0* 5.4*  ALBUMIN 2.6* 2.2* --   No results found for this basename: LIPASE:2,AMYLASE:2 in the last 72 hours  Basename 03/30/11 0600 03/29/11 0500  WBC 10.5 7.7  NEUTROABS -- --  HGB 10.1* 9.1*  HCT 30.9* 27.9*  MCV 84.7 82.8  PLT 197 173   No results found for this basename: CKTOTAL:3,CKMB:3,CKMBINDEX:3,TROPONINI:3 in the last 72 hours No components found with this basename: POCBNP:3 No results found for this basename: DDIMER:2 in the last 72 hours No results found for this basename: HGBA1C:2 in the last 72 hours No results found for this basename: CHOL:2,HDL:2,LDLCALC:2,TRIG:2,CHOLHDL:2,LDLDIRECT:2 in the last 72 hours No results found for this basename:  TSH,T4TOTAL,FREET3,T3FREE,THYROIDAB in the last 72 hours  Basename 03/29/11 1000  VITAMINB12 900  FOLATE 11.6  FERRITIN 356*  TIBC 219*  IRON 143*  RETICCTPCT 0.8    Micro Results: Recent Results (from the past 240 hour(s))  MRSA PCR SCREENING     Status: Normal   Collection Time   03/25/11  7:15 PM      Component Value Range Status Comment   MRSA by PCR NEGATIVE  NEGATIVE  Final   URINE CULTURE     Status: Normal   Collection Time   03/25/11  8:51 PM      Component Value Range Status Comment   Specimen Description URINE, CATHETERIZED   Final    Special Requests NONE   Final    Setup Time [id number]   Final    Colony Count NO GROWTH   Final    Culture NO GROWTH   Final    Report Status 03/26/2011 FINAL   Final     Studies/Results: No results found.  Medications: I have reviewed the patient's current medications.   Patient Active Hospital Problem List: AKI (acute kidney injury) (03/25/2011) Improving.  Nephro is managing.  Pt is to get IVF's tonight.  AKI thought to be  due to ATN  Decompensated COPD with exacerbation (chronic obstructive pulmonary disease) (03/25/2011) Pt is on nebs, prednisone, and is getting closer to baseline.  Evaluating med patient is not currently on abx.  Will add levaquin pharmacy to [place] dose.  NSTEMI (non-ST elevated myocardial infarction) (03/25/2011) Pt has no new complaints of chest discomfort. All cardiac troponin I's were negative 03/26/11  Hypothyroidism (03/25/2011) Home synthroid dose was recently decreased, Pt will need f/u TSH level as outpatient.     Anemia: Stable no active bleeding.  OSA:  Stable, patient may need cpap.   Disposition: Patient is requesting transfer to [facility] as it is closer to her daughter.  Per nursing [facility] was sending their patient's here because they were full.  Will f/u     LOS: 5 days   Penny Pia M.D.  [facility] 03/30/2011, 6:39 PM

## 2011-03-30 NOTE — Progress Notes (Signed)
ANTIBIOTIC CONSULT NOTE - INITIAL  Pharmacy Consult for Levaquin Indication: COPD exacerbation  Allergies  Allergen Reactions  . Nitroglycerin In D5w Other (See Comments)    "flatlines"    Patient Measurements: Height: 5\' 4"  (162.6 cm) Weight: 225 lb 8.5 oz (102.3 kg) IBW/kg (Calculated) : 54.7    Vital Signs: Temp: 96.9 F (36.1 C) (01/05 1502) Temp src: Axillary (01/05 1502) BP: 135/77 mmHg (01/05 1502) Pulse Rate: 94  (01/05 1502) Intake/Output from previous day: 01/04 0701 - 01/05 0700 In: 3 [I.V.:3] Out: 2852 [Urine:2850; Stool:2] Intake/Output from this shift: Total I/O In: 240 [P.O.:240] Out: 1 [Stool:1]  Labs:  Basename 03/30/11 0600 03/30/11 0500 03/29/11 0500 03/28/11 0500  WBC 10.5 -- 7.7 --  HGB 10.1* -- 9.1* --  PLT 197 -- 173 --  LABCREA -- -- -- --  CREATININE -- 3.92* 4.99* 6.12*   Estimated Creatinine Clearance: 15.8 ml/min (by C-G formula based on Cr of 3.92). No results found for this basename: VANCOTROUGH:2,VANCOPEAK:2,VANCORANDOM:2,GENTTROUGH:2,GENTPEAK:2,GENTRANDOM:2,TOBRATROUGH:2,TOBRAPEAK:2,TOBRARND:2,AMIKACINPEAK:2,AMIKACINTROU:2,AMIKACIN:2, in the last 72 hours   Microbiology: Recent Results (from the past 720 hour(s))  MRSA PCR SCREENING     Status: Normal   Collection Time   03/25/11  7:15 PM      Component Value Range Status Comment   MRSA by PCR NEGATIVE  NEGATIVE  Final   URINE CULTURE     Status: Normal   Collection Time   03/25/11  8:51 PM      Component Value Range Status Comment   Specimen Description URINE, CATHETERIZED   Final    Special Requests NONE   Final    Setup Time [id number]   Final    Colony Count NO GROWTH   Final    Culture NO GROWTH   Final    Report Status 03/26/2011 FINAL   Final     Medical History: Past Medical History  Diagnosis Date  . Shortness of breath   . Sleep apnea   . Cancer 01/2009    SCC of the lung  . GERD (gastroesophageal reflux disease)   . Hypothyroidism   . COPD (chronic  obstructive pulmonary disease) 2-3 LPM  . Diabetes mellitus newly diagnosed    Medications:  Scheduled:    . albuterol  2.5 mg Nebulization QID  . antiseptic oral rinse  15 mL Mouth Rinse q12n4p  . aspirin  325 mg Oral Daily  . calcium-vitamin D  1 tablet Oral TID  . chlorhexidine  15 mL Mouth Rinse BID  . diltiazem  30 mg Oral Q8H  . guaiFENesin  600 mg Oral BID  . heparin subcutaneous  5,000 Units Subcutaneous Q8H  . insulin aspart  0-9 Units Subcutaneous TID WC  . ipratropium  0.5 mg Nebulization QID  . levothyroxine  100 mcg Oral QAC breakfast  . pantoprazole  40 mg Oral BID AC  . predniSONE  60 mg Oral Q breakfast  . sodium chloride  3 mL Intravenous Q12H   Assessment: 71 y/o female patient admitted with COPD exacerbation requiring broad spectrum antibiotics. Concurrent acute renal failure requiring dosage adjustment.   Plan:  Levaquin 750mg  po x1 dose then 500mg  q48h. Will continue to monitor renal fxn.  Verlene Mayer, PharmD, BCPS Pager [phone] 03/30/2011,7:00 PM

## 2011-03-30 NOTE — Plan of Care (Signed)
Problem: Phase II Progression Outcomes Goal: Tol increased activity, up in chair for at least 4 hrs/HD pt Outcome: Progressing Up with PT at 1400.  Pt states she would like to be out of bed more.

## 2011-03-30 NOTE — Progress Notes (Signed)
Physical Therapy Evaluation Patient Details Name: Gabrielle Hudson MRN: [id number] DOB: 1940-09-14 Today's Date: 03/30/2011  Problem List:  Patient Active Problem List  Diagnoses  . AKI (acute kidney injury)  . Decompensated COPD with exacerbation (chronic obstructive pulmonary disease)  . NSTEMI (non-ST elevated myocardial infarction)  . Hypocalcemia  . Hypothyroidism  . Metabolic acidosis  . Abnormal CT scan of lung  . Community acquired pneumonia  . Leukocytosis  . Anemia  . OSA (obstructive sleep apnea)  . Transaminitis  . H/O: lung cancer  . NSVT (nonsustained ventricular tachycardia)    Past Medical History:  Past Medical History  Diagnosis Date  . Shortness of breath   . Sleep apnea   . Cancer 01/2009    SCC of the lung  . GERD (gastroesophageal reflux disease)   . Hypothyroidism   . COPD (chronic obstructive pulmonary disease) 2-3 LPM  . Diabetes mellitus newly diagnosed   Past Surgical History:  Past Surgical History  Procedure Date  . Portacath placement 2010  . Colon surgery     PT Assessment/Plan/Recommendation Patient with very limited mobility prior to admission, primarily used motorized WC for all mobility.   PT Assessment Clinical Impression Statement: pt admitted with Acute hypoxic respiratory failure and possible recurrence of lung cancer.  Pt is oxygen dependant and with limited mobility prior to admission.  Pt will benefit from acute PT to maximize functional mobilty to hopefully allow for DC home with help from family and friends.   PT Recommendation/Assessment: Patient will need skilled PT in the acute care venue PT Problem List: Decreased mobility;Cardiopulmonary status limiting activity;Decreased activity tolerance PT Therapy Diagnosis : Difficulty walking PT Plan PT Frequency: Min 3X/week PT Treatment/Interventions: Gait training;Therapeutic exercise;Patient/family education PT Recommendation Follow Up Recommendations: Home health  PT Equipment Recommended: None recommended by PT PT Goals  Acute Rehab PT Goals PT Goal Formulation: With patient Time For Goal Achievement: 2 weeks Pt will go Supine/Side to Sit: with supervision;with HOB 0 degrees Pt will go Sit to Stand: with supervision Pt will Ambulate: 1 - 15 feet;with supervision;with other equipment (comment) (furniture walking as prior to admission)  PT Evaluation    Prior Functioning  Home Living Lives With: Alone Receives Help From: Family;Friend(s);Personal care attendant Type of Home: Apartment Home Layout: One level Home Access: Level entry Home Adaptive Equipment: Wheelchair - powered;Straight cane;Walker - standard Prior Function Level of Independence: Independent with basic ADLs;Other (comment) (only walked a few feet.  primarily used motorized chair) Able to Take Stairs?: No Cognition Cognition Arousal/Alertness: Awake/alert Overall Cognitive Status: Appears within functional limits for tasks assessed Sensation/Coordination   Extremity Assessment RLE Assessment RLE Assessment: Within Functional Limits LLE Assessment LLE Assessment: Within Functional Limits Mobility (including Balance) Bed Mobility Bed Mobility: Yes Supine to Sit: 5: Supervision;With rails;HOB elevated (Comment degrees) (20 degrees) Transfers Transfers: Yes Sit to Stand: 5: Supervision;With upper extremity assist;From bed;From chair/3-in-1 Sit to Stand Details (indicate cue type and reason): performed twice Stand to Sit: 5: Supervision;Without upper extremity assist;Other (comment) (despite verbal cues pt did not reach for armrest on chair) Ambulation/Gait Ambulation/Gait: Yes Ambulation/Gait Assistance: 4: Min assist (min gaurd assist) Ambulation Distance (Feet): 4 Feet (performed twice) Assistive device: Other (Comment) (pt held to PT arm an/or windsill) Gait Pattern: Decreased stride length    Exercise    End of Session PT - End of Session Equipment Utilized  During Treatment: Gait belt Activity Tolerance: Patient tolerated treatment well Patient left: in chair;with call bell in reach Nurse Communication:  Mobility status for transfers;Mobility status for ambulation General Behavior During Session: [facility] for tasks performed Cognition: [facility] for tasks performed  Donnella Sham 03/30/2011, 3:39 PM Lavona Mound, PT  [phone] 03/30/2011

## 2011-03-31 LAB — GLUCOSE, CAPILLARY
Glucose-Capillary: 79 mg/dL (ref 70–99)
Glucose-Capillary: 185 mg/dL — ABNORMAL HIGH (ref 70–99)
Glucose-Capillary: 184 mg/dL — ABNORMAL HIGH (ref 70–99)

## 2011-03-31 LAB — CBC
HCT: 29.5 % — ABNORMAL LOW (ref 36.0–46.0)
Hemoglobin: 9.6 g/dL — ABNORMAL LOW (ref 12.0–15.0)
MCH: 27.4 pg (ref 26.0–34.0)
MCHC: 32.5 g/dL (ref 30.0–36.0)
MCV: 84.3 fL (ref 78.0–100.0)
Platelets: 176 10*3/uL (ref 150–400)
RBC: 3.5 MIL/uL — ABNORMAL LOW (ref 3.87–5.11)
RDW: 15.8 % — ABNORMAL HIGH (ref 11.5–15.5)
WBC: 8.6 10*3/uL (ref 4.0–10.5)

## 2011-03-31 LAB — RENAL FUNCTION PANEL
Albumin: 2.5 g/dL — ABNORMAL LOW (ref 3.5–5.2)
BUN: 87 mg/dL — ABNORMAL HIGH (ref 6–23)
CO2: 24 mEq/L (ref 19–32)
Calcium: 7.8 mg/dL — ABNORMAL LOW (ref 8.4–10.5)
Chloride: 109 mEq/L (ref 96–112)
Creatinine, Ser: 2.86 mg/dL — ABNORMAL HIGH (ref 0.50–1.10)
GFR calc Af Amer: 18 mL/min — ABNORMAL LOW (ref >90)
GFR calc non Af Amer: 16 mL/min — ABNORMAL LOW (ref >90)
Glucose, Bld: 83 mg/dL (ref 70–99)
Phosphorus: 4.3 mg/dL (ref 2.3–4.6)
Potassium: 4.5 mEq/L (ref 3.5–5.1)
Sodium: 145 mEq/L (ref 135–145)

## 2011-03-31 MED ORDER — SODIUM CHLORIDE 0.45 % IV SOLN
INTRAVENOUS | Status: DC
  Administered 2011-04-01: 07:00:00 via INTRAVENOUS

## 2011-03-31 NOTE — Progress Notes (Signed)
Patient ID: Gabrielle Hudson, female   DOB: 1940-06-04, 71 y.o.   MRN: [id number]  S:  Feeling better daily.  Creatinine continues to improve, down to 2.8 today.    O:BP 152/71  Pulse 100  Temp(Src) 97.5 F (36.4 C) (Axillary)  Resp 21  Ht 5\' 4"  (1.626 m)  Wt 97.7 kg (215 lb 6.2 oz)  BMI 36.97 kg/m2  SpO2 96%  Intake/Output Summary (Last 24 hours) at 03/31/11 1810 Last data filed at 03/31/11 1435  Gross per 24 hour  Intake 609.67 ml  Output   3851 ml  Net -3241.33 ml   Weight change: -4.6 kg (-10 lb 2.3 oz) Gen: alert, laying at 30deg, no distress, calm CVS: no rub, regular Resp: occ rhonchi, o/w clear Abd: soft, obese nontender Ext: no edema   Lab 03/31/11 0520 03/30/11 0500 03/29/11 0500 03/28/11 0500 03/27/11 0500 03/26/11 0525 03/25/11 2040  NA 145 145 143 142 138 139 135  K 4.5 4.4 4.7 5.3* 4.5 4.7 4.7  CL 109 109 106 108 104 106 104  CO2 24 24 25  18* 16* 16* 16*  GLUCOSE 83 76 152* 214* 97 109* 149*  BUN 87* 115* 123* 134* 129* 118* 112*  CREATININE 2.86* 3.92* 4.99* 6.12* 6.87* 6.54* 6.49*  ALB -- -- -- -- -- -- --  CALCIUM 7.8* 7.7* 7.1* 7.7* 8.1* 7.6* 7.3*  PHOS 4.3 4.1 -- 4.7* -- -- 5.4*  AST -- -- 18 24 -- 61* 64*  ALT -- -- 51* 62* -- 60* 54*   Liver Function Tests:  Lab 03/31/11 0520 03/30/11 0500 03/29/11 0500 03/28/11 0500 03/26/11 0525  AST -- -- 18 24 61*  ALT -- -- 51* 62* 60*  ALKPHOS -- -- 51 60 61  BILITOT -- -- 0.2* 0.2* 0.2*  PROT -- -- 5.0* 5.4* 5.1*  ALBUMIN 2.5* 2.6* 2.2* -- --   No results found for this basename: LIPASE:3,AMYLASE:3 in the last 168 hours  Lab 03/25/11 2120  AMMONIA <10*   CBC:  Lab 03/31/11 0520 03/30/11 0600 03/29/11 0500 03/27/11 0500 03/26/11 0525 03/25/11 2040  WBC 8.6 10.5 7.7 -- -- --  NEUTROABS -- -- -- -- -- 10.2*  HGB 9.6* 10.1* 9.1* -- -- --  HCT 29.5* 30.9* 27.9* -- -- --  MCV 84.3 84.7 82.8 82.5 84.3 --  PLT 176 197 173 -- -- --   Cardiac Enzymes:  Lab 03/26/11 2015 03/26/11 1400 03/26/11 0525  03/25/11 2150  CKTOTAL 74 85 105 124  CKMB 10.1* 11.1* 12.5* 14.0*  CKMBINDEX -- -- -- --  TROPONINI <0.30 <0.30 <0.30 <0.30   CBG:  Lab 03/31/11 1655 03/31/11 1201 03/31/11 0746 03/30/11 2131 03/30/11 1800  GLUCAP 184* 185* 79 169* 153*    Iron Studies:   Basename 03/29/11 1000  IRON 143*  TIBC 219*  TRANSFERRIN --  FERRITIN 356*   Studies/Results: No results found. ECHO 03/27/11: EF 60-65%, mild LVH. Mild Pulm HTN.     Marland Kitchen albuterol  2.5 mg Nebulization QID  . antiseptic oral rinse  15 mL Mouth Rinse q12n4p  . aspirin  325 mg Oral Daily  . calcium-vitamin D  1 tablet Oral TID  . chlorhexidine  15 mL Mouth Rinse BID  . diltiazem  30 mg Oral Q8H  . guaiFENesin  600 mg Oral BID  . heparin subcutaneous  5,000 Units Subcutaneous Q8H  . insulin aspart  0-9 Units Subcutaneous TID WC  . ipratropium  0.5 mg Nebulization QID  . levofloxacin  500 mg Oral Q48H  . levofloxacin  750 mg Oral Once  . levothyroxine  100 mcg Oral QAC breakfast  . pantoprazole  40 mg Oral BID AC  . predniSONE  60 mg Oral Q breakfast  . sodium chloride  3 mL Intravenous Q12H    BMET    Component Value Date/Time   NA 145 03/31/2011 0520   K 4.5 03/31/2011 0520   CL 109 03/31/2011 0520   CO2 24 03/31/2011 0520   GLUCOSE 83 03/31/2011 0520   BUN 87* 03/31/2011 0520   CREATININE 2.86* 03/31/2011 0520   CALCIUM 7.8* 03/31/2011 0520   GFRNONAA 16* 03/31/2011 0520   GFRAA 18* 03/31/2011 0520   CBC    Component Value Date/Time   WBC 8.6 03/31/2011 0520   RBC 3.50* 03/31/2011 0520   HGB 9.6* 03/31/2011 0520   HCT 29.5* 03/31/2011 0520   PLT 176 03/31/2011 0520   MCV 84.3 03/31/2011 0520   MCH 27.4 03/31/2011 0520   MCHC 32.5 03/31/2011 0520   RDW 15.8* 03/31/2011 0520   LYMPHSABS 0.6* 03/25/2011 2040   MONOABS 0.4 03/25/2011 2040   EOSABS 0.0 03/25/2011 2040   BASOSABS 0.0 03/25/2011 2040     Assessment/Plan: 1. ARF- Cr. 0.6 in 2010. Resolving AKI likely due to ATN.  Creatinine improving daily, down to 2.8 today.  No further  suggestions.  Keep off diuretics until fully resolved, avoid ACEI/ARB, NSAID's and COX II agents until fully resolved.  Should continue to recover, don't know baseline however. Will sign off, please call as needed.     2. CAP- improved 3. HTN- stable  4. NSTEMI- stable ECHO no regional wall motion abnormality. Vinson Moselle, MD [employer] [phone] cell 03/31/2011, 6:10 PM

## 2011-03-31 NOTE — Progress Notes (Signed)
Subjective: Pt mentions that she feels better and that her breathing is closer to baseline.  She denies any fever, chills, nausea, or abdominal discomfort.  No acute issues overnight.  Pt initially wanted to be transferred to [facility].  But currently patient is doing better and we have discussed the possibility of discharge soon pending her continued improvement. Patient is agreeable to staying.  Objective: Filed Vitals:   03/30/11 2103 03/30/11 2115 03/31/11 0515 03/31/11 1435  BP:  158/72 143/73 152/71  Pulse:  98 86 100  Temp:  98.1 F (36.7 C) 97.7 F (36.5 C) 97.5 F (36.4 C)  TempSrc:  Axillary    Resp:  20 20 21   Height:      Weight:   97.7 kg (215 lb 6.2 oz)   SpO2: 97% 93% 98% 96%   Weight change: -4.6 kg (-10 lb 2.3 oz)  Intake/Output Summary (Last 24 hours) at 03/31/11 1720 Last data filed at 03/31/11 1435  Gross per 24 hour  Intake 609.67 ml  Output   3851 ml  Net -3241.33 ml    General: Alert, awake, oriented x3, in no acute distress.  HEENT: No bruits, no goiter.  Heart: Regular rate and rhythm, without murmurs, rubs, gallops.  Lungs:Prolongued exp phase, mild exp wheeze Abdomen: Soft, nontender, nondistended, positive bowel sounds.  Neuro: Grossly intact, nonfocal.   Lab Results:  Basename 03/31/11 0520 03/30/11 0500  NA 145 145  K 4.5 4.4  CL 109 109  CO2 24 24  GLUCOSE 83 76  BUN 87* 115*  CREATININE 2.86* 3.92*  CALCIUM 7.8* 7.7*  MG -- --  PHOS 4.3 4.1    Basename 03/31/11 0520 03/30/11 0500 03/29/11 0500  AST -- -- 18  ALT -- -- 51*  ALKPHOS -- -- 51  BILITOT -- -- 0.2*  PROT -- -- 5.0*  ALBUMIN 2.5* 2.6* --   No results found for this basename: LIPASE:2,AMYLASE:2 in the last 72 hours  Basename 03/31/11 0520 03/30/11 0600  WBC 8.6 10.5  NEUTROABS -- --  HGB 9.6* 10.1*  HCT 29.5* 30.9*  MCV 84.3 84.7  PLT 176 197   No results found for this basename: CKTOTAL:3,CKMB:3,CKMBINDEX:3,TROPONINI:3 in the last 72 hours No  components found with this basename: POCBNP:3 No results found for this basename: DDIMER:2 in the last 72 hours No results found for this basename: HGBA1C:2 in the last 72 hours No results found for this basename: CHOL:2,HDL:2,LDLCALC:2,TRIG:2,CHOLHDL:2,LDLDIRECT:2 in the last 72 hours No results found for this basename: TSH,T4TOTAL,FREET3,T3FREE,THYROIDAB in the last 72 hours  Basename 03/29/11 1000  VITAMINB12 900  FOLATE 11.6  FERRITIN 356*  TIBC 219*  IRON 143*  RETICCTPCT 0.8    Micro Results: Recent Results (from the past 240 hour(s))  MRSA PCR SCREENING     Status: Normal   Collection Time   03/25/11  7:15 PM      Component Value Range Status Comment   MRSA by PCR NEGATIVE  NEGATIVE  Final   URINE CULTURE     Status: Normal   Collection Time   03/25/11  8:51 PM      Component Value Range Status Comment   Specimen Description URINE, CATHETERIZED   Final    Special Requests NONE   Final    Setup Time [id number]   Final    Colony Count NO GROWTH   Final    Culture NO GROWTH   Final    Report Status 03/26/2011 FINAL   Final  Studies/Results: No results found.  Medications: I have reviewed the patient's current medications.   Patient Active Hospital Problem List: AKI (acute kidney injury) (03/25/2011) Improved after IVF hydration.  Was thought by nephro to be secondary to ATN. Nephro on board and condition improving.  Baseline? Creatinine went from 3.92 to 2.86  From yesterday to today after overnight IVF.   Decompensated COPD with exacerbation (chronic obstructive pulmonary disease) (03/25/2011) Pt is getting back to baseline - continue supplemental O 2 patient is on 2 L [place] at home - Prednisone - nebs - levaquin  Hypothyroidism (03/25/2011) Stable will continue current regimen.     Abnormal CT scan of lung (03/27/2011) Ct on 03/26/11 showed that there was a new focal ground-glass opacity in the right upper lobe which was inflammatory vs atypical neoplasm.   Patient will require f/u as outpatient once discharged.  Community acquired pneumonia (03/27/2011) Pt on Levaquin.  Breathing status closer to baseline.  Leukocytosis (03/27/2011) Resolved WBC at 8.6 today.    Anemia (03/27/2011) Stable currently.   OSA (obstructive sleep apnea) (03/27/2011) Stable off of CPAP.  Not sure is she uses CPAP at home. Patient may require sleep study as outpatient  NSVT (nonsustained ventricular tachycardia) (03/27/2011) No reports of SVT, patient stable with RRR  Disposition:  Initially patient wanted to be transferred but patient's condition is improving and she is closer to her baseline breathing status.  Also her creatinine is trending down.  Therefore will plan on f/u with nephrology tomorrow to see if patient will be able to be discharged from their stand point tomorrow pending renal function.     LOS: 6 days   Penny Pia M.D.  [facility] 03/31/2011, 5:20 PM

## 2011-04-01 LAB — RENAL FUNCTION PANEL
Albumin: 2.6 g/dL — ABNORMAL LOW (ref 3.5–5.2)
BUN: 72 mg/dL — ABNORMAL HIGH (ref 6–23)
CO2: 26 mEq/L (ref 19–32)
Calcium: 7.4 mg/dL — ABNORMAL LOW (ref 8.4–10.5)
Chloride: 109 mEq/L (ref 96–112)
Creatinine, Ser: 2.3 mg/dL — ABNORMAL HIGH (ref 0.50–1.10)
GFR calc Af Amer: 24 mL/min — ABNORMAL LOW (ref >90)
GFR calc non Af Amer: 21 mL/min — ABNORMAL LOW (ref >90)
Glucose, Bld: 93 mg/dL (ref 70–99)
Phosphorus: 3.3 mg/dL (ref 2.3–4.6)
Potassium: 4.4 mEq/L (ref 3.5–5.1)
Sodium: 143 mEq/L (ref 135–145)

## 2011-04-01 LAB — GLUCOSE, CAPILLARY
Glucose-Capillary: 151 mg/dL — ABNORMAL HIGH (ref 70–99)
Glucose-Capillary: 136 mg/dL — ABNORMAL HIGH (ref 70–99)
Glucose-Capillary: 157 mg/dL — ABNORMAL HIGH (ref 70–99)

## 2011-04-01 MED ORDER — OMEPRAZOLE 20 MG PO CPDR: 20.0000 mg | DELAYED_RELEASE_CAPSULE | Freq: Every day | ORAL | Status: DC

## 2011-04-01 MED ORDER — PREDNISONE 10 MG PO TABS: ORAL_TABLET | ORAL | Status: DC

## 2011-04-01 MED ORDER — HEPARIN SOD (PORK) LOCK FLUSH 100 UNIT/ML IV SOLN: 500.0000 [IU] | INTRAVENOUS | Status: DC | PRN

## 2011-04-01 MED ORDER — DILTIAZEM HCL 60 MG PO TABS: 60.0000 mg | ORAL_TABLET | Freq: Two times a day (BID) | ORAL | Status: DC

## 2011-04-01 MED ORDER — LEVOFLOXACIN 500 MG PO TABS: 500.0000 mg | ORAL_TABLET | Freq: Once | ORAL | Status: AC

## 2011-04-01 MED ORDER — ASPIRIN 325 MG PO TABS: 325.0000 mg | ORAL_TABLET | Freq: Every day | ORAL | Status: AC

## 2011-04-01 NOTE — Progress Notes (Signed)
Port-a-cath deaccessed by IV team.  Discharge instructions, prescriptions, and follow-up appointments given.  All belongings sent with pt and all questions answered.  Pt transported in wheelchair by tech and traveled home with family member by private vehicle.

## 2011-04-01 NOTE — Progress Notes (Signed)
PT CANCELLATION NOTE  Pt. Declined therapy this afternoon. Pt states that she is ready to be discharged and is awaiting daughter to take her home. Pt declined ambulation or further education.   04/01/2011 Fredrich Birks PTA [phone] pager [phone] office

## 2011-04-01 NOTE — Discharge Summary (Signed)
DISCHARGE SUMMARY  Gabrielle Hudson  MR#: [id number]  DOB:October 21, 1940  Date of Admission: 03/25/2011 Date of Discharge: 04/01/2011  Attending Physician:KRISHNAN,SENDIL K  Patient's PCP: [facility]  Consults: -[facility]-Dr.[place]ato [place] [facility]-Dr. Jasmine Awe  Discharge Diagnoses: Present on Admission:  .AKI (acute kidney injury) .Decompensated COPD with exacerbation (chronic obstructive pulmonary disease) .NSTEMI (non-ST elevated myocardial infarction) .Hypocalcemia .Hypothyroidism .Metabolic acidosis .Abnormal CT scan of lung .Community acquired pneumonia .Leukocytosis .Anemia .OSA (obstructive sleep apnea) .Transaminitis .NSVT (nonsustained ventricular tachycardia) .CKD (chronic kidney disease) stage 3, GFR 30-59 ml/min    Current Discharge Medication List    START taking these medications   Details  aspirin 325 MG tablet Take 1 tablet (325 mg total) by mouth daily.    diltiazem (CARDIZEM) 60 MG tablet Take 1 tablet (60 mg total) by mouth 2 (two) times daily. Qty: 60 tablet, Refills: 0    levofloxacin (LEVAQUIN) 500 MG tablet Take 1 tablet (500 mg total) by mouth once. Qty: 1 tablet, Refills: 0    omeprazole (PRILOSEC) 20 MG capsule Take 1 capsule (20 mg total) by mouth daily. Qty: 30 capsule, Refills: 0    predniSONE (DELTASONE) 10 MG tablet 50mg  po daily x 2 days, then 40mg  po daily x 2 days and continue decreasing by 10mg  every 2 days until finished. Qty: 30 tablet, Refills: 0      CONTINUE these medications which have NOT CHANGED   Details  budesonide-formoterol (SYMBICORT) 160-4.5 MCG/ACT inhaler Inhale 2 puffs into the lungs 2 (two) times daily.      Calcium Carbonate-Vitamin D (CALCIUM + D PO) Take 1 tablet by mouth daily.      IRON PO Take 1 tablet by mouth daily.      levothyroxine (SYNTHROID, LEVOTHROID) 175 MCG tablet Take 175 mcg by mouth daily.      sodium chloride (OCEAN) 0.65 % nasal spray Place  1 spray into the nose as needed. For dry nose     tiotropium (SPIRIVA) 18 MCG inhalation capsule Place 18 mcg into inhaler and inhale daily.         Hospital Course: Present on Admission:  .AKI (acute kidney injury): Patient is a 71 year old white female with past medical history of COPD and hypothyroidism who presented to the [facility] on 03/20/2011 with signs of clinical dehydration, shortness of breath-COPD exacerbation and found to be in acute renal failure. Patient was initially admitted to [facility] regional, fair her course was complicated by worsening respiratory failure requiring BiPAP which was treated as severe sepsis with shock. During her hospital course there she had a non-ST elevated MI which is felt to be playing a role in the interim have acute oliguric renal failure. She was then transferred over to [facility] where nephrology and possible dialysis services were available. Initially her creatinine was 2 at [facility], but then progressed to as high as 6. At that point she was then transferred to [facility]. Nephrology felt that this was secondary to ATN, and the patient's diuretics as well as any ACE or ARBs were held. She was hydrated and over the next several days her creatinine continued to improve. By day of discharge her creatinine was down to 2.30. Nephrology recommended continuing to hold her diuretics and ACE inhibitor and NSAIDs (although she has been on aspirin since admission) and she was to be medically stable for discharge with follow up with her primary care physician and a recheck of her renal function at that time.  Marland Kitchen  Decompensated COPD with exacerbation (chronic obstructive pulmonary disease): Principal problem. This is felt to the main issue behind her shortness of breath. Patient was started on IV steroids plus oxygen plus bronchodilators and broad-spectrum antibiotics. Over the next several days, her breathing continued to improve.  Steroids were able to be converted from IV to by mouth after 3-4 days, and she currently be discharged on a steroid taper. She is continued on nebulizers. Her oxygen was able to be weaned down and currently she is breathing comfortably on 2 L nasal cannula which is her home dose. She completed 5 of 7 days of antibiotics and because her Levaquin has been changed over to every other day we'll only need one more day Levaquin on Wednesday 04/03/11. Critical care help hospital's comanage this patient until her breathing improved.  Marland Kitchen abnormal cardiac markers: It is doubtful that she truly had a non-ST elevated MI. She initially presented with hypotension and mild elevation of troponins at [facility] nonischemic EKG. After she was transferred to [facility], her troponins have been normal although her MBs have been elevated more consistent with acute renal failure. A 2-D echocardiogram noted no wall motion abnormalities and a preserved ejection fraction, actually hyperdynamic.  Marland KitchenHypocalcemia .Hypothyroidism: The patient workup, her TSH was noted to be low at 0.018, but with unremarkable free T4 and free T3. The patient had previously been on 175 mcg of Synthroid, but in the outpatient setting, this had been decreased to 100 mcg by mouth daily. When she see out of the acute setting of illness, these levels need to be rechecked to determine whether or not she truly is hypothyroid at this time.  .Metabolic acidosis .Abnormal CT scan of lung: Patient has a documented history of right upper lobe lung cancer treated in 2010 with surgery, chemotherapy and radiation. Patient is unsure of the type of tissue diagnosis she had. We have requested records from [facility] including a hard copy of any CT scans and x-ray on disc. CT of the chest this admission showed abnormalities in the right upper lobe (radiation pneumonitis versus inflammatory change) and hilum (2.6 x 2.2 cm mass) which are likely due to scarring from previous  treatments for cancer. Pulmonology is concerned over possible evolving recurrence of her pulmonary cancer. Intercurrent physiologic state of decompensation she is not a candidate at the present time for invasive diagnostic procedure such as fiberoptic bronchoscopy. Because of her underlying lung disease she is likely not a candidate for general anesthesia and surgical resection of any recurrent pulmonary mass. Outpatient PET scan should be considered.  .Community acquired pneumonia: See above.   .Anemia: Etiology unclear. Her renal failure appears to be acute and therefore the anemia does not appear to be mediated at this point from renal dysfunction. There also do not appear to be any signs of acute GI bleeding. Workup here was unremarkable and more consistent with anemia of chronic disease.  .OSA (obstructive sleep apnea): Once patient was moved out of the ICU she was stable off of daytime extra ventilation. She needs to use at nighttime CPAP.  Marland KitchenTransaminitis: This was felt to be a nonobstructive pattern, likely consistent with shock liver from earlier hypotension. Her initially elevated on admission, but the Lasix checked on 1/4 up to be near normal.  .NSVT (nonsustained ventricular tachycardia): Patient had several occurrences of nonsustained VT during her stay at [facility]. At that time her potassium was stable.  It was felt to do related to her stress and recent hypotension in the  setting of acute altered renal failure. A 2-D echocardiogram was done and hyperdynamic ejection fraction of 65-70% but no regional wall abnormalities. She was noted to have grade 1 diastolic dysfunction. She is being discharged on aspirin and beta blocker.   Day of Discharge BP 153/74  Pulse 79  Temp(Src) 98.1 F (36.7 C) (Oral)  Resp 20  Ht 5\' 4"  (1.626 m)  Wt 95.4 kg (210 lb 5.1 oz)  BMI 36.10 kg/m2  SpO2 93%  Physical Exam: General: Alert, awake, oriented x3, in no acute distress.  HEENT: No  bruits, no goiter.  Heart: Regular rate and rhythm, without murmurs, rubs, gallops.  Lungs:Prolongued exp phase, decreased breath sounds throughout Abdomen: Soft, nontender, nondistended, positive bowel sounds.  Neuro: Grossly intact, nonfocal.   Results for orders placed during the hospital encounter of 03/25/11 (from the past 24 hour(s))  GLUCOSE, CAPILLARY     Status: Abnormal   Collection Time   03/31/11  4:55 PM      Component Value Range   Glucose-Capillary 184 (*) 70 - 99 (mg/dL)  GLUCOSE, CAPILLARY     Status: Abnormal   Collection Time   03/31/11  9:30 PM      Component Value Range   Glucose-Capillary 151 (*) 70 - 99 (mg/dL)  RENAL FUNCTION PANEL     Status: Abnormal   Collection Time   04/01/11  3:49 AM      Component Value Range   Sodium 143  135 - 145 (mEq/L)   Potassium 4.4  3.5 - 5.1 (mEq/L)   Chloride 109  96 - 112 (mEq/L)   CO2 26  19 - 32 (mEq/L)   Glucose, Bld 93  70 - 99 (mg/dL)   BUN 72 (*) 6 - 23 (mg/dL)   Creatinine, Ser 1.61 (*) 0.50 - 1.10 (mg/dL)   Calcium 7.4 (*) 8.4 - 10.5 (mg/dL)   Phosphorus 3.3  2.3 - 4.6 (mg/dL)   Albumin 2.6 (*) 3.5 - 5.2 (g/dL)   GFR calc non Af Amer 21 (*) >90 (mL/min)   GFR calc Af Amer 24 (*) >90 (mL/min)  GLUCOSE, CAPILLARY     Status: Abnormal   Collection Time   04/01/11  7:24 AM      Component Value Range   Glucose-Capillary 136 (*) 70 - 99 (mg/dL)  GLUCOSE, CAPILLARY     Status: Abnormal   Collection Time   04/01/11 11:59 AM      Component Value Range   Glucose-Capillary 157 (*) 70 - 99 (mg/dL)    Disposition: Improved, being discharged home. PT recommended home health PT but patient declined.  Follow-up Appts: Discharge Orders    Future Orders Please Complete By Expires   Diet - low sodium heart healthy      Increase activity slowly         Follow-up with [facility] ,her primary care physicians in 1-2 weeks.  Tests Needing Follow-up: Followup BMET to ensure renal function continues to  improve. Patient will also need an outpatient PET scan to follow up her lung mass.  Time spent in discharge (includes decision making & examination of pt): 50 minutes  Signed: Hollice Espy 04/01/2011, 3:11 PM

## 2011-04-01 NOTE — Progress Notes (Signed)
   CARE MANAGEMENT NOTE 04/01/2011  Patient:  Gabrielle Hudson, Gabrielle Hudson   Account Number:  [id number]  Date Initiated:  03/27/2011  Documentation initiated by:  Onnie Boer  Subjective/Objective Assessment:   PT WAS ADMITTED WITH ACUTE RENAL FAILURE     Action/Plan:   PROGRESSION OF CARE AND DISCHARGE PLANNING  PT eval- recommending HHPT   Anticipated DC Date:  04/01/2011   Anticipated DC Plan:  HOME W HOME HEALTH SERVICES  In-house referral  Clinical Social Worker      DC Planning Services  CM consult      Choice offered to / List presented to:  C-1 Patient        HH arranged  HH-2 PT      HH agency  [facility]   Status of service:  Completed, signed off Medicare Important Message given?   (If response is "NO", the following Medicare IM given date fields will be blank) Date Medicare IM given:   Date Additional Medicare IM given:    Discharge Disposition:  HOME W HOME HEALTH SERVICES  Per UR Regulation:  Reviewed for med. necessity/level of care/duration of stay  Comments:  04/01/11 Spoke with patient about HHC for HHPT. Patient chose [facility] from list of HHC agencies. Conatcted Remington Highbaugh at [facility] and requested HHPT. Jacquelynn Cree RN, BSN, CCM   03/27/11 Onnie Boer, RN, BSN 1453 PT WAS ADMITTED FROM [facility] FROM HOME.  PT DAYGHTER WOULD LIKE TO TRANSFER HER TO [facility] AS SHE HAS NOT HAD A GOOD EXPERIENCE HERE BEFORE.  MD IS WORKING ON AN ACUTE TO ACUTE TRANSFER.  WILL F/U.

## 2011-04-01 NOTE — Progress Notes (Signed)
Occupational Therapy Evaluation Patient Details Name: Gabrielle Hudson MRN: [id number] DOB: [birth year] Today's Date: 04/01/2011 8:44-9:14  evII Problem List:  Patient Active Problem List  Diagnoses  . AKI (acute kidney injury)  . Decompensated COPD with exacerbation (chronic obstructive pulmonary disease)  . Hypothyroidism  . Metabolic acidosis  . Abnormal CT scan of lung  . Community acquired pneumonia  . Anemia  . OSA (obstructive sleep apnea)  . Transaminitis  . H/O: lung cancer  . NSVT (nonsustained ventricular tachycardia)    Past Medical History:  Past Medical History  Diagnosis Date  . Shortness of breath   . Sleep apnea   . Cancer 01/2009    SCC of the lung  . GERD (gastroesophageal reflux disease)   . Hypothyroidism   . COPD (chronic obstructive pulmonary disease) 2-3 LPM  . Diabetes mellitus newly diagnosed   Past Surgical History:  Past Surgical History  Procedure Date  . Portacath placement 2010  . Colon surgery     OT Assessment/Plan/Recommendation OT Assessment Clinical Impression Statement: 71 year old female admitted with COPD exacerbation and MI.  Currently presents with decreased endurance, decreased balance for overall ADL independence.  Will benefit from acute OT services to address these issues in order to increase independence to a modified independent level.  Feel pt will also need a RW and 3:1 for home.  Do not anticipate any follow-up OT needs. OT Recommendation/Assessment: Patient will need skilled OT in the acute care venue OT Problem List: Decreased activity tolerance;Impaired balance (sitting and/or standing);Decreased knowledge of use of DME or AE;Cardiopulmonary status limiting activity Barriers to Discharge: Decreased caregiver support OT Therapy Diagnosis : Generalized weakness OT Plan OT Frequency: Min 2X/week OT Treatment/Interventions: Self-care/ADL training;Energy conservation;DME and/or AE instruction;Patient/family  education;Balance training;Therapeutic activities OT Recommendation Follow Up Recommendations: None Equipment Recommended: Rolling walker with 5" wheels;3 in 1 bedside comode Individuals Consulted Consulted and Agree with Results and Recommendations: Patient OT Goals Acute Rehab OT Goals OT Goal Formulation: With patient Time For Goal Achievement: 2 weeks ADL Goals Pt Will Perform Grooming: with modified independence;Standing at sink ADL Goal: Grooming - Progress: Not met Pt Will Perform Upper Body Bathing: with modified independence;Sitting at sink ADL Goal: Upper Body Bathing - Progress: Not met Pt Will Perform Lower Body Bathing: with modified independence;Sit to stand from chair;Sit to stand from bed;Sitting, chair Pt Will Perform Upper Body Dressing: with modified independence ADL Goal: Upper Body Dressing - Progress: Not met Pt Will Perform Lower Body Dressing: with supervision;Sit to stand from chair ADL Goal: Lower Body Dressing - Progress: Not met Pt Will Transfer to Toilet: with modified independence;with DME;3-in-1 ADL Goal: Toilet Transfer - Progress: Not met Pt Will Perform Toileting - Clothing Manipulation: with modified independence;Sitting on 3-in-1 or toilet;Standing ADL Goal: Toileting - Clothing Manipulation - Progress: Not met Pt Will Perform Toileting - Hygiene: with modified independence;Sit to stand from 3-in-1/toilet ADL Goal: Toileting - Hygiene - Progress: Progressing toward goals Miscellaneous OT Goals Miscellaneous OT Goal #1: Pt will verbalize at least 3 energy conservation techniques for selfcare tasks with independence. OT Goal: Miscellaneous Goal #1 - Progress: Not met  OT Evaluation Precautions/Restrictions  Precautions Required Braces or Orthoses: No Restrictions Weight Bearing Restrictions: No Prior Functioning Home Living Lives With: Alone Receives Help From: Family;Friend(s);Personal care attendant Type of Home: Apartment Home Layout: One  level Home Access: Level entry Bathroom Shower/Tub: Engineer, manufacturing systems: Standard Bathroom Accessibility: Yes Home Adaptive Equipment: Wheelchair - powered;Straight cane;Walker - standard;Hand-held shower hose;Shower  chair with back Prior Function Level of Independence: Independent with basic ADLs;Other (comment);Needs assistance with homemaking (only walked a few feet.  primarily used motorized chair) Light Housekeeping: Other (comment) (Pt's daughter does.) ADL ADL Eating/Feeding: Simulated;Independent Where Assessed - Eating/Feeding: Chair Grooming: Performed;Supervision/safety Where Assessed - Grooming: Standing at sink Upper Body Bathing: Simulated;Supervision/safety Where Assessed - Upper Body Bathing: Sitting, chair Lower Body Bathing: Simulated;Supervision/safety Where Assessed - Lower Body Bathing: Sit to stand from chair Upper Body Dressing: Simulated;Set up Where Assessed - Upper Body Dressing: Sitting, chair Lower Body Dressing: Simulated;Supervision/safety Where Assessed - Lower Body Dressing: Sit to stand from chair Toilet Transfer: Performed;Minimal assistance Toilet Transfer Details (indicate cue type and reason): Pt requires min assist for sit to stand from regular toilet. Toilet Transfer Method: Proofreader: Regular height toilet Toileting - Clothing Manipulation: Performed;Minimal assistance Where Assessed - Glass blower/designer Manipulation: Sit on 3-in-1 or toilet Toileting - Hygiene: Performed;Supervision/safety Where Assessed - Toileting Hygiene: Sit on 3-in-1 or toilet Tub/Shower Transfer: Simulated;Supervision/safety Tub/Shower Transfer Method: Ambulating ADL Comments: Pt overall with decreased endurance.  Able to tolerate standing during grooming tasks for 3 mins without needing rest break.  Dyspnea 3/4 howerver O2 sats remained greater than 93 % on room air.  Pt noted with slight decreased balance.  At home usually uses a  cane but now likely needs initial use of a RW for safety..  Also feel she will need 3:1 secondary to not being able to efficiently stand from regular toilet. Vision/Perception  Vision - History Baseline Vision: No visual deficits Patient Visual Report: No change from baseline Cognition Cognition Arousal/Alertness: Awake/alert Overall Cognitive Status: Appears within functional limits for tasks assessed Sensation/Coordination Sensation Light Touch: Appears Intact Stereognosis: Not tested Hot/Cold: Not tested Coordination Gross Motor Movements are Fluid and Coordinated: Yes Fine Motor Movements are Fluid and Coordinated: Yes Extremity Assessment RUE Assessment RUE Assessment: Within Functional Limits LUE Assessment LUE Assessment: Within Functional Limits Mobility  Bed Mobility Bed Mobility: No Transfers Transfers: Yes Sit to Stand: From toilet;4: Min assist Exercises   End of Session OT - End of Session Activity Tolerance: Patient limited by fatigue Patient left: in chair;with call bell in reach General Behavior During Session: [facility] for tasks performed Cognition: [facility] for tasks performed   Fahad Cisse OTR/L 04/01/2011, 9:38 AM  Pager number [phone]

## 2011-04-01 NOTE — Progress Notes (Addendum)
PATIENT DETAILS Name: Gabrielle Hudson Age: 71 y.o. Sex: female Date of Birth: 08-25-1940 Admit Date: 03/25/2011 PCP: [facility] POA:   CONSULTS: [facility]  Subjective:  Pt is very anxious for d/c home. States breathing is at baseline. Up to bathroom without difficulty. Denies any CP.   Objective: Vital signs in last 24 hours: Temp:  [97.5 F (36.4 C)-98.3 F (36.8 C)] 98.3 F (36.8 C) (01/07 0510) Pulse Rate:  [85-103] 85  (01/07 0850) Resp:  [20-21] 20  (01/07 0510) BP: (129-152)/(52-71) 136/69 mmHg (01/07 0510) SpO2:  [93 %-98 %] 96 % (01/07 0951) Weight:  [95.4 kg (210 lb 5.1 oz)] 210 lb 5.1 oz (95.4 kg) (01/07 0510) Weight change: -2.3 kg (-5 lb 1.1 oz) Last BM Date: 03/31/11  Intake/Output from previous day:  Intake/Output Summary (Last 24 hours) at 04/01/11 1236 Last data filed at 04/01/11 0900  Gross per 24 hour  Intake    600 ml  Output    800 ml  Net   -200 ml     Physical Exam:  Gen:  Awake, alert in bedside chair in NAD  Cardiovascular:  S1S2, RRR Respiratory:  Diminished throughout but CTAB, no increased WOB Gastrointestinal: abd obese, soft, NT. BS+ Extremities: no c/c/e    Lab Results: @cprlabs @ Recent Results (from the past 240 hour(s))  MRSA PCR SCREENING     Status: Normal   Collection Time   03/25/11  7:15 PM      Component Value Range Status Comment   MRSA by PCR NEGATIVE  NEGATIVE  Final   URINE CULTURE     Status: Normal   Collection Time   03/25/11  8:51 PM      Component Value Range Status Comment   Specimen Description URINE, CATHETERIZED   Final    Special Requests NONE   Final    Setup Time [id number]   Final    Colony Count NO GROWTH   Final    Culture NO GROWTH   Final    Report Status 03/26/2011 FINAL   Final     Studies/Results: No results found.  Medications: Scheduled Meds:   . albuterol  2.5 mg Nebulization QID  . antiseptic oral rinse  15 mL Mouth Rinse q12n4p  . aspirin   325 mg Oral Daily  . calcium-vitamin D  1 tablet Oral TID  . chlorhexidine  15 mL Mouth Rinse BID  . diltiazem  30 mg Oral Q8H  . guaiFENesin  600 mg Oral BID  . heparin subcutaneous  5,000 Units Subcutaneous Q8H  . insulin aspart  0-9 Units Subcutaneous TID WC  . ipratropium  0.5 mg Nebulization QID  . levofloxacin  500 mg Oral Q48H  . levothyroxine  100 mcg Oral QAC breakfast  . pantoprazole  40 mg Oral BID AC  . predniSONE  60 mg Oral Q breakfast  . sodium chloride  3 mL Intravenous Q12H   Continuous Infusions:   . sodium chloride 75 mL/hr at 04/01/11 0725   PRN Meds:.sodium chloride, acetaminophen, acetaminophen, albuterol, influenza  inactive virus vaccine, ondansetron (ZOFRAN) IV, ondansetron, sodium chloride, sodium chloride Antibiotics: Anti-infectives     Start     Dose/Rate Route Frequency Ordered Stop   04/01/11 2000   levofloxacin (LEVAQUIN) tablet 500 mg        500 mg Oral Every 48 hours 03/30/11 1903     03/30/11 2000   levofloxacin (LEVAQUIN) tablet 750 mg  750 mg Oral  Once 03/30/11 1903 03/30/11 1954   03/25/11 2200   piperacillin-tazobactam (ZOSYN) IVPB 2.25 g  Status:  Discontinued        2.25 g 100 mL/hr over 30 Minutes Intravenous 3 times per day 03/25/11 2052 03/27/11 1504   03/25/11 2100   vancomycin (VANCOCIN) IVPB 1000 mg/200 mL premix  Status:  Discontinued        1,000 mg 200 mL/hr over 60 Minutes Intravenous Every 48 hours 03/25/11 2052 03/27/11 1504           Assessment/Plan:  1. AKI (acute kidney injury) (03/25/2011) Improved after IVF hydration. Was thought by nephro to be secondary to ATN.  Uncertain of baseline. However, Creatinine was 0.6 in 2010. Renal has signed-off.  -Continue holding diuretics until fully resolved, avoid ACE/ARBs and NSAIDS.  -Pt will need close f/u with PCP to recheck  2. Decompensated COPD with exacerbation (chronic obstructive pulmonary disease) (03/25/2011) Pt appears at [place]. Will continue  supplemental O2 as prior to admission.  - continue supplemental O 2 patient is on 2 L [place] at home  -taper prednisone (pt is not on chronically) -continue Spiriva and Symbicort with prn Albuterol    3. Hypothyroidism (03/25/2011) -cont Synthorid at d/c  4. Abnormal CT scan of lung (03/27/2011) Ct on 03/26/11 showed that there was a new focal ground-glass opacity in the right upper lobe which was inflammatory vs atypical neoplasm. -Patient will require f/u as outpatient once discharged.   5. Community acquired pneumonia (03/27/2011) Pt received 2 days of empiric Vanco/Zosyn prior to Levaquin D3.  -Would continue treatment x 7 days 6. Leukocytosis (03/27/2011) Resolved WBC at 8.6 today.   7. Anemia (03/27/2011) Stable throughout hospitalization  8. OSA (obstructive sleep apnea) (03/27/2011) Stable off of CPAP. Not sure is she uses CPAP at home. Patient may require sleep study as outpatient   9. NSVT (nonsustained ventricular tachycardia) (03/27/2011) No reports of SVT, patient stable with RRR   10. Disposition Renal has signed-off. Pt appears medically stable for d/c home with close outpt follow-up for recheck of renal function.   Cordelia Pen, NP  [phone]    LOS: 7 days    04/01/2011, 12:36 PM

## 2013-11-14 IMAGING — CT CT CHEST W/O CM
2 of 3 series · 15 of 36 positions shown, 18 images · non-contrast
Comparison: Radiographs 03/25/2011.  CT 11/05/2010 ([facility] Rashed
[place]).

CLINICAL DATA: Severe shortness of breath with chest pain.
Question lung mass.  Renal insufficiency.

CT CHEST WITHOUT CONTRAST
TECHNIQUE: Multidetector CT imaging of the chest was performed
following the standard protocol without IV contrast.

[Series 2: routine chest 5.0 st · axial · 0.67mm/px · z∈[+1186,+1426]mm · 12 of 58 slices shown, 15 images]
[im 5/58  mediastinal]
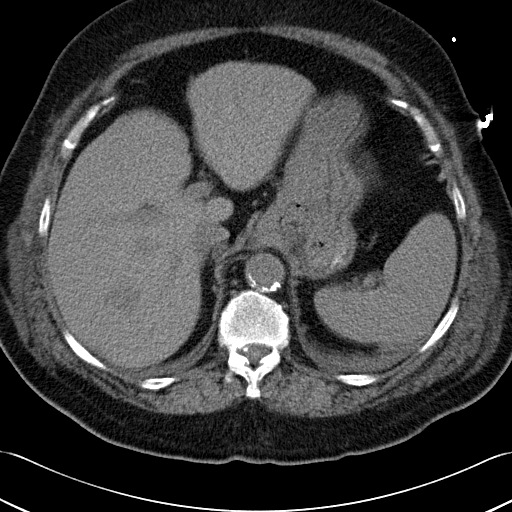
[im 5/58  lung]
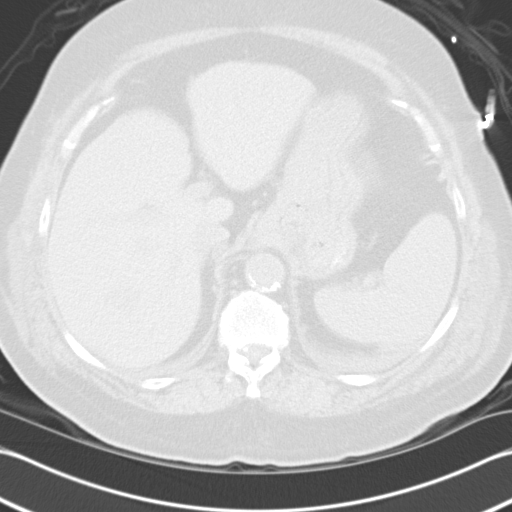
[im 9/58  lung]
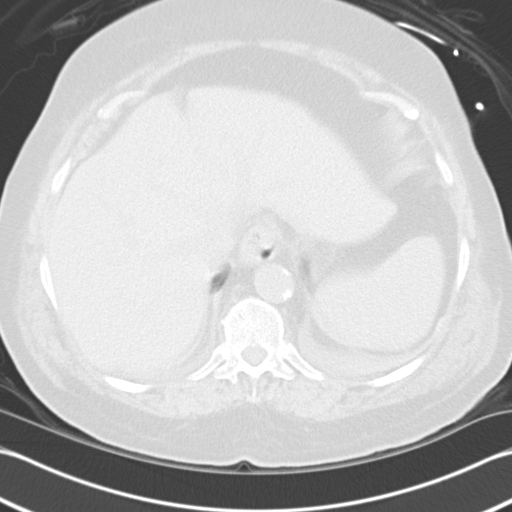
[im 13/58  lung]
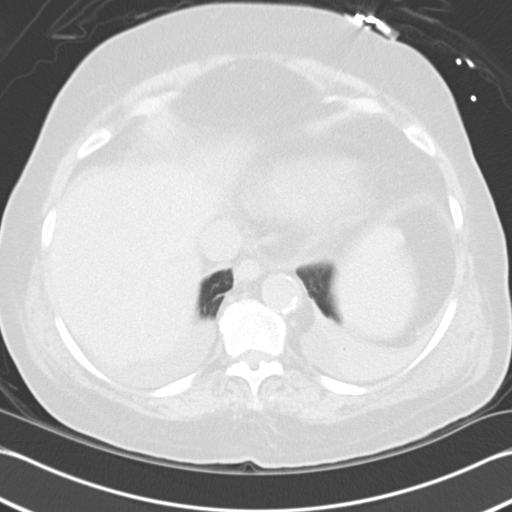
[im 17/58  lung]
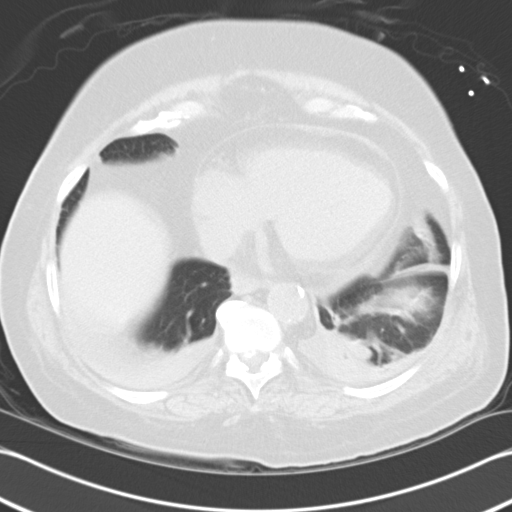
[im 22/58  mediastinal]
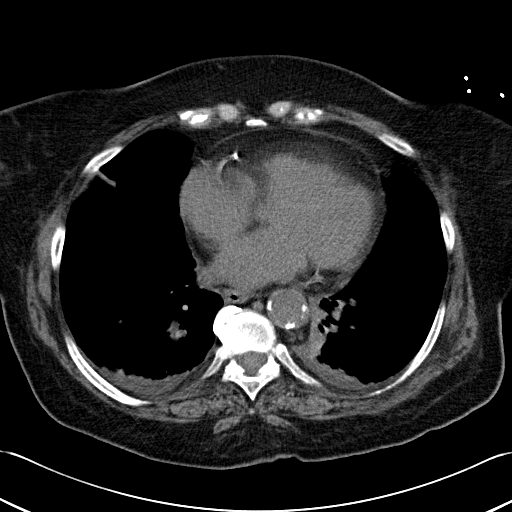
[im 22/58  lung]
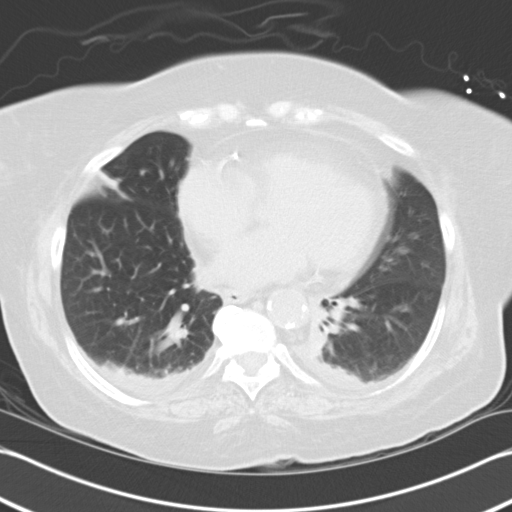
[im 26/58  lung]
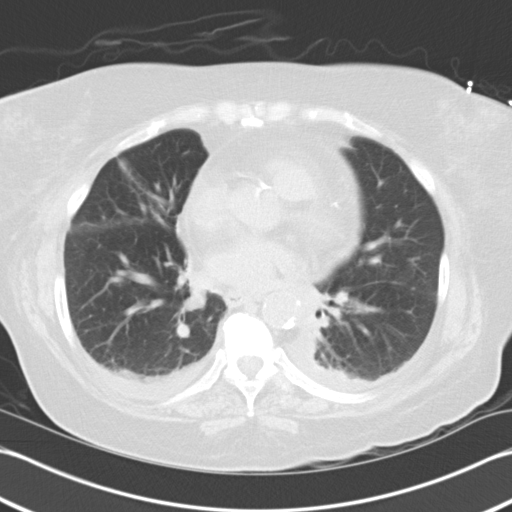
[im 32/58  lung]
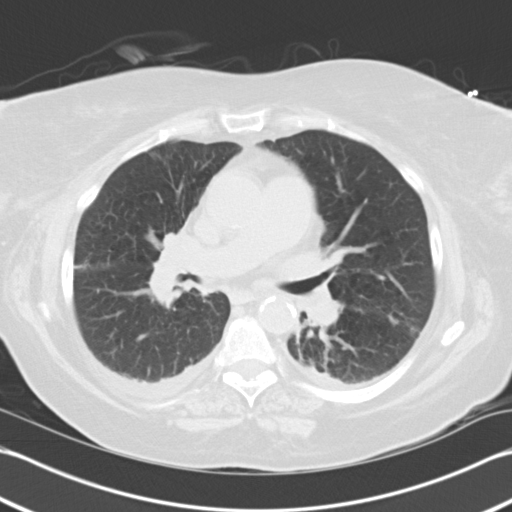
[im 36/58  lung]
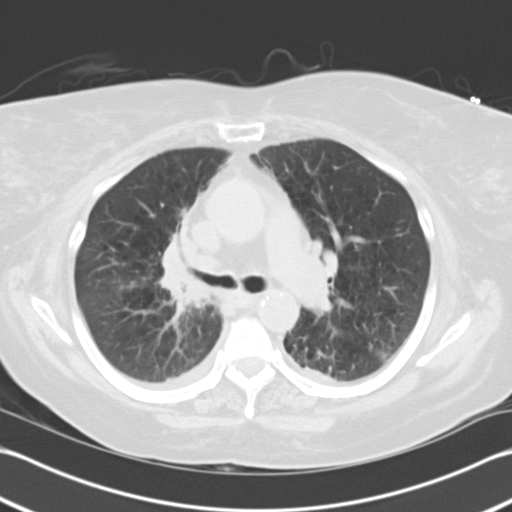
[im 41/58  mediastinal]
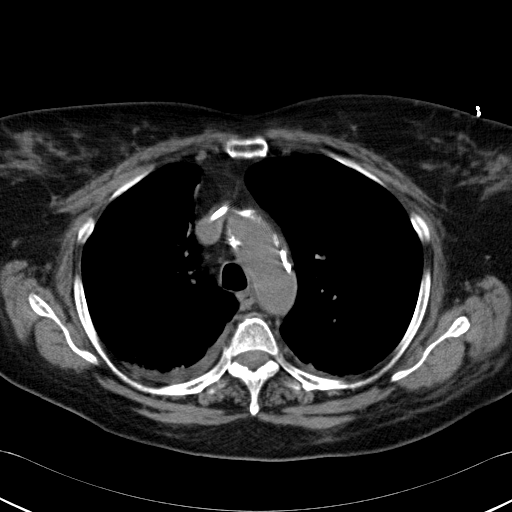
[im 41/58  lung]
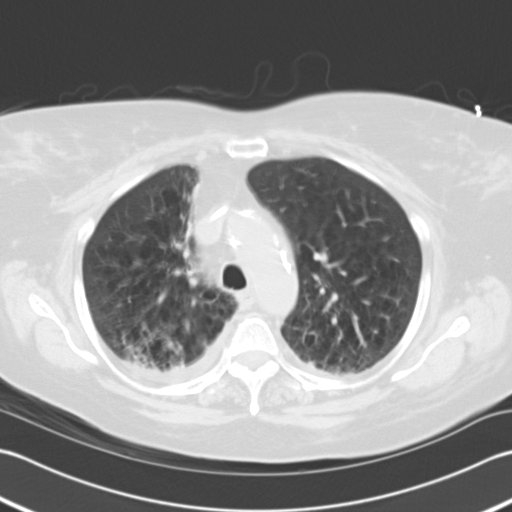
[im 45/58  lung]
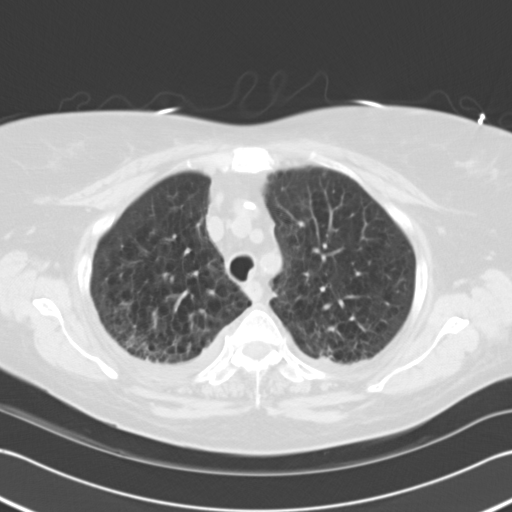
[im 49/58  lung]
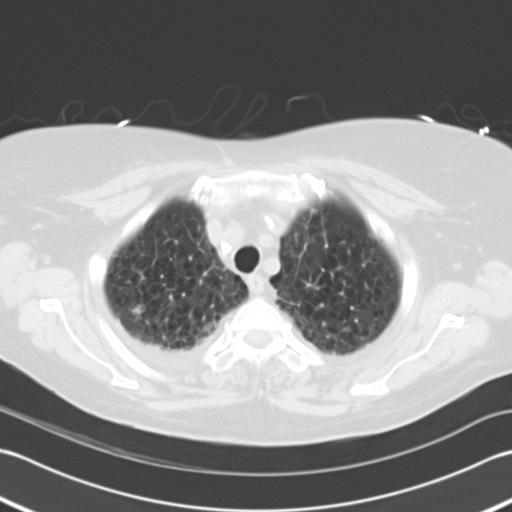
[im 53/58  lung]
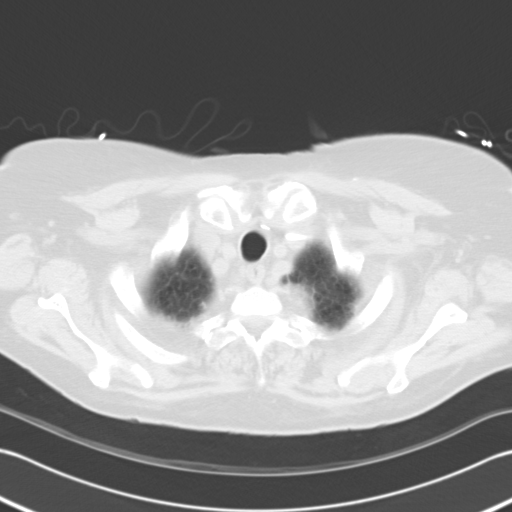

[Series 5: coronals · coronal · 0.65mm/px · 3 of 137 slices shown]
[im 28/137  lung]
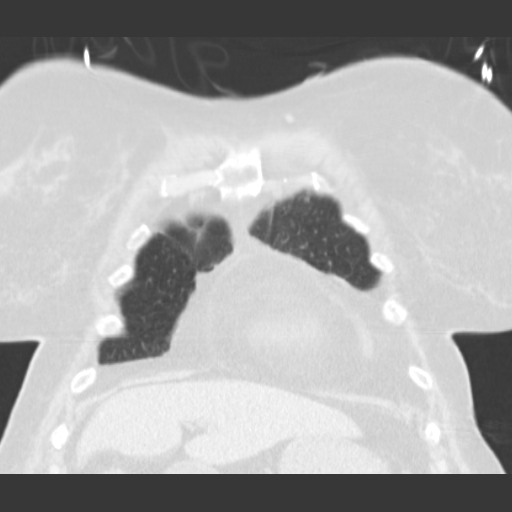
[im 55/137  lung]
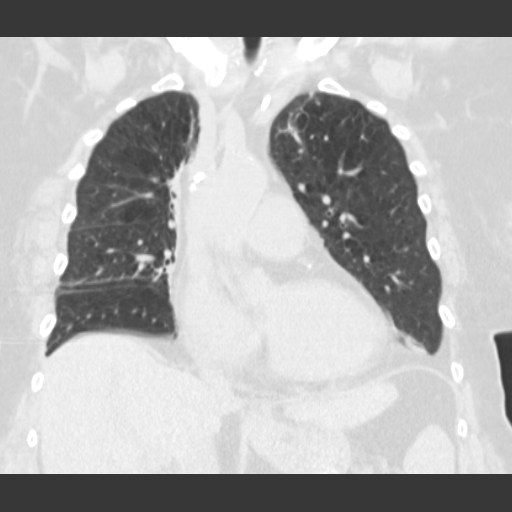
[im 82/137  lung]
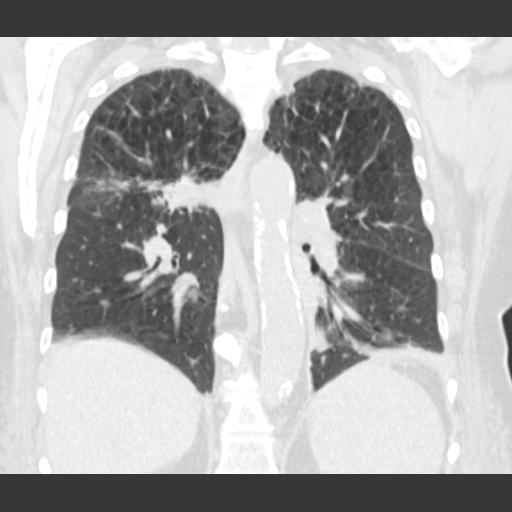

[15 of 36 positions shown; findings below may reference images not displayed]

FINDINGS: According to prior history, the patient has a history of
lung cancer.  There is a left subclavian Port-A-Cath with its tip
in the SVC.  There are postsurgical changes status post
thyroidectomy.  A high right paratracheal node measuring 9 mm on
image 7 has mildly enlarged.  No other discrete lymph nodes are
identified.  However, there is increased soft tissue density
posteriorly in the right hilum, measuring approximately 2.6 x
cm on image 21.  Adjacent parenchymal opacity has also slightly
progressed.  There is central distortion of the tracheobronchial
tree suggesting prior radiation to this area. There is no gross
hilar adenopathy allowing for the limitation of no intravenous
contrast.

Small bilateral pleural effusions are present.  There are
associated dependent opacities at both lung bases.  Diffuse
emphysematous changes are again noted.  There is stable nodularity
in the right upper lobe on images 10 and 12.  There is a new ill-
defined ground-glass opacity more inferiorly in the right upper
lobe on image 25, measuring approximately 2.8 cm in diameter.

As evaluated in the noncontrast state, the visualized upper abdomen
appears stable with stable low density lesions in the right hepatic
lobe measuring up to 2.5 cm on image 54.  No suspicious osseous
lesions are identified.
IMPRESSION: 1.  Compared with the prior CT from 11/05/2010, there is increased
right hilar fullness and adjacent parenchymal opacity.  These
findings may be secondary to radiation pneumonitis/fibrosis.
Recurrent mass cannot be excluded.
2.  New focal ground-glass opacity in the right upper lobe may be
inflammatory.  Follow up is warranted to exclude atypical neoplasm.
3.  Small bilateral pleural effusions with associated bibasilar
pulmonary opacities, likely atelectasis.
4.  Stable liver lesions.

PET CT should be considered to assess for hypermetabolic activity
if there is clinical concern of recurrent neoplasm.

## 2013-11-14 IMAGING — CR DG CHEST 1V PORT
1 series · 1 of 1 positions shown · non-contrast
Comparison: None.

CLINICAL DATA: Short of breath

PORTABLE CHEST - 1 VIEW

[AP]
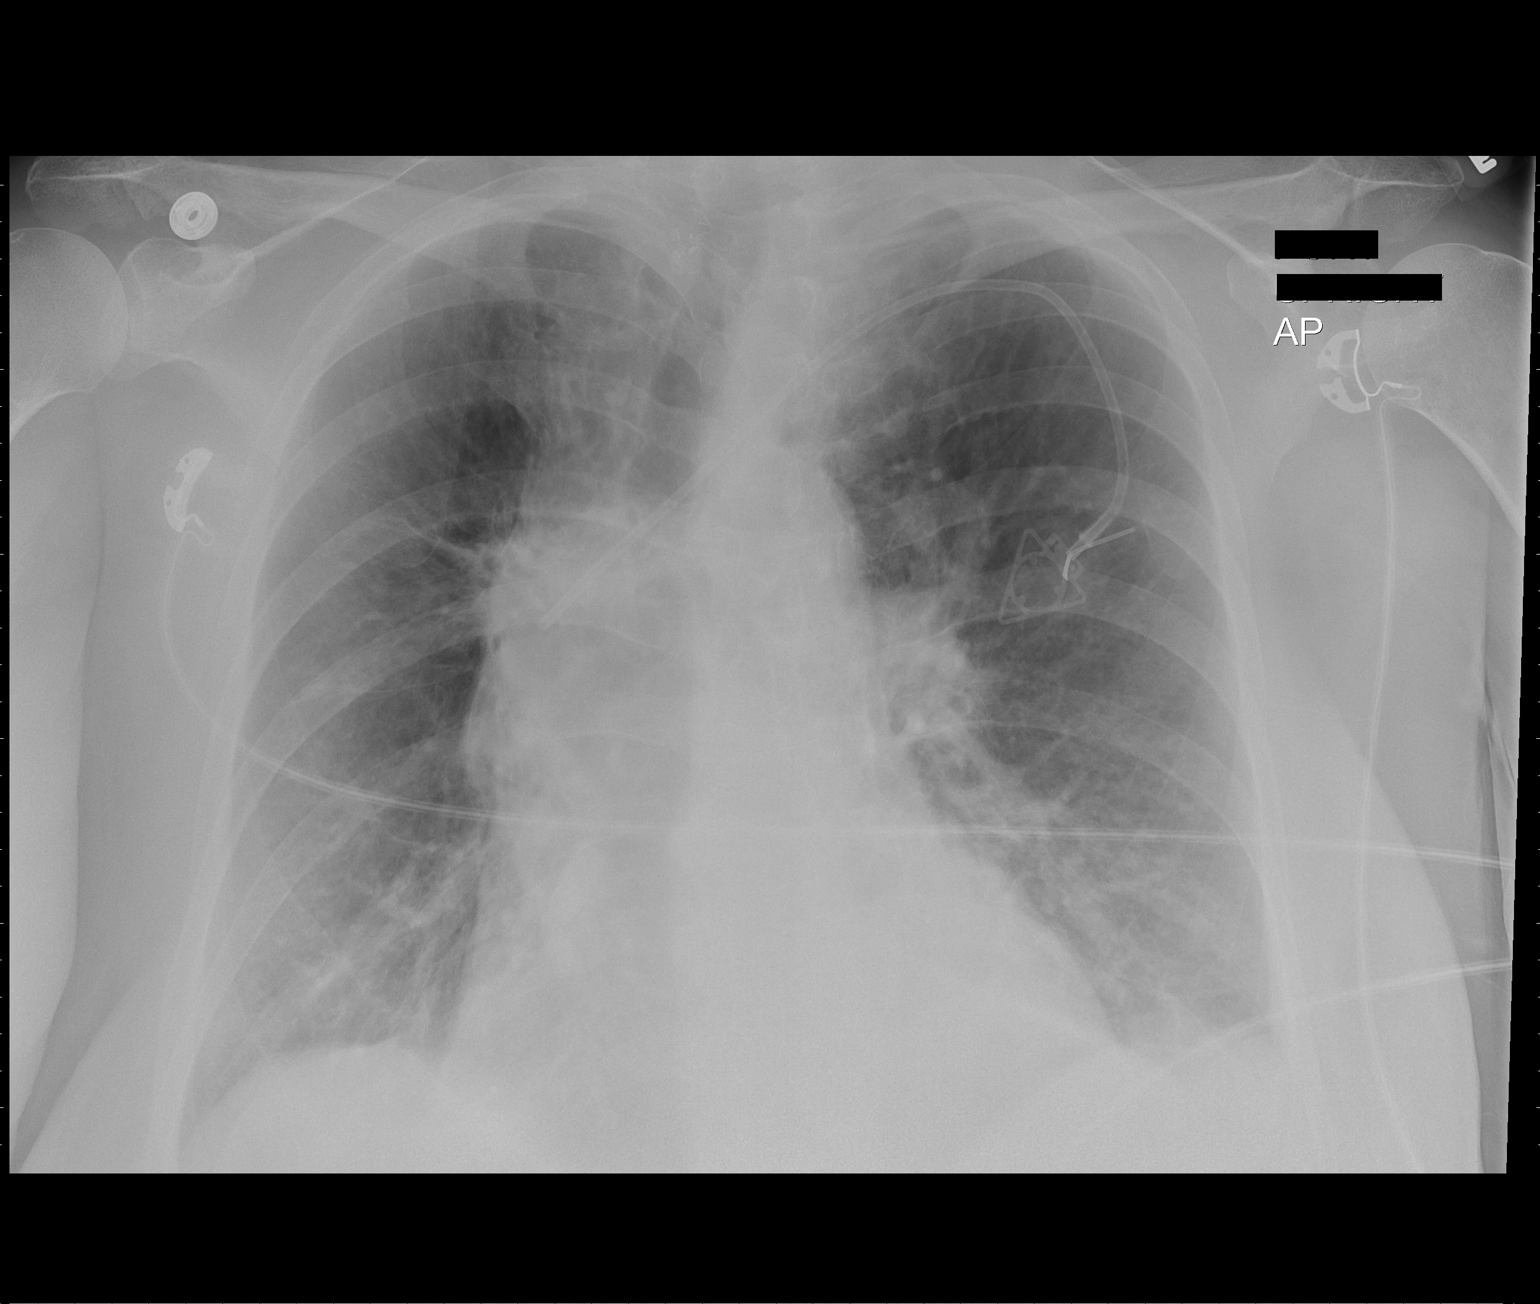

[1 of 1 positions shown; findings below may reference images not displayed]

FINDINGS: Shallow inspiration.  Cardiac enlargement with mild
increased pulmonary vascularity.  Hazy infiltrates in the lung
bases may represent mild edema.  No focal airspace consolidation.
No blunting of costophrenic angles.  No pneumothorax.  Power port
type left central venous catheter with tip in the upper SVC.
Prominent right hilar and right paratracheal shadows likely
represent mass and scarring.  Correlation with clinical history is
recommended.  Consider CT for further evaluation if clinically
indicated.
IMPRESSION: Congestive changes in the heart lungs with mild interstitial edema.
Prominence of right paratracheal and right hilar tissues suggesting
mass lesion.

## 2014-03-31 DIAGNOSIS — J449 Chronic obstructive pulmonary disease, unspecified: Secondary | ICD-10-CM | POA: Diagnosis not present

## 2014-05-01 DIAGNOSIS — J449 Chronic obstructive pulmonary disease, unspecified: Secondary | ICD-10-CM | POA: Diagnosis not present

## 2016-07-29 DIAGNOSIS — J449 Chronic obstructive pulmonary disease, unspecified: Secondary | ICD-10-CM | POA: Diagnosis not present

## 2016-08-15 DIAGNOSIS — Z7689 Persons encountering health services in other specified circumstances: Secondary | ICD-10-CM | POA: Diagnosis not present

## 2016-08-15 DIAGNOSIS — J449 Chronic obstructive pulmonary disease, unspecified: Secondary | ICD-10-CM | POA: Diagnosis not present

## 2016-08-15 DIAGNOSIS — Z139 Encounter for screening, unspecified: Secondary | ICD-10-CM | POA: Diagnosis not present

## 2016-08-15 DIAGNOSIS — E039 Hypothyroidism, unspecified: Secondary | ICD-10-CM | POA: Diagnosis not present

## 2016-08-27 DIAGNOSIS — Z452 Encounter for adjustment and management of vascular access device: Secondary | ICD-10-CM | POA: Diagnosis not present

## 2016-08-27 DIAGNOSIS — Z85118 Personal history of other malignant neoplasm of bronchus and lung: Secondary | ICD-10-CM | POA: Diagnosis not present

## 2016-08-29 DIAGNOSIS — J449 Chronic obstructive pulmonary disease, unspecified: Secondary | ICD-10-CM | POA: Diagnosis not present

## 2016-09-05 DIAGNOSIS — I998 Other disorder of circulatory system: Secondary | ICD-10-CM | POA: Diagnosis not present

## 2016-09-12 DIAGNOSIS — Z01818 Encounter for other preprocedural examination: Secondary | ICD-10-CM | POA: Diagnosis not present

## 2016-09-16 DIAGNOSIS — Z452 Encounter for adjustment and management of vascular access device: Secondary | ICD-10-CM | POA: Diagnosis not present

## 2016-09-16 DIAGNOSIS — C189 Malignant neoplasm of colon, unspecified: Secondary | ICD-10-CM | POA: Diagnosis not present

## 2016-09-16 DIAGNOSIS — I998 Other disorder of circulatory system: Secondary | ICD-10-CM | POA: Diagnosis not present

## 2016-09-16 DIAGNOSIS — C3490 Malignant neoplasm of unspecified part of unspecified bronchus or lung: Secondary | ICD-10-CM | POA: Diagnosis not present

## 2016-09-16 DIAGNOSIS — Z87891 Personal history of nicotine dependence: Secondary | ICD-10-CM | POA: Diagnosis not present

## 2016-09-16 DIAGNOSIS — T82518A Breakdown (mechanical) of other cardiac and vascular devices and implants, initial encounter: Secondary | ICD-10-CM | POA: Diagnosis not present

## 2016-09-16 DIAGNOSIS — Z9889 Other specified postprocedural states: Secondary | ICD-10-CM | POA: Diagnosis not present

## 2016-09-16 DIAGNOSIS — Z79899 Other long term (current) drug therapy: Secondary | ICD-10-CM | POA: Diagnosis not present

## 2016-09-16 DIAGNOSIS — Z9049 Acquired absence of other specified parts of digestive tract: Secondary | ICD-10-CM | POA: Diagnosis not present

## 2016-09-16 DIAGNOSIS — T82598A Other mechanical complication of other cardiac and vascular devices and implants, initial encounter: Secondary | ICD-10-CM | POA: Diagnosis not present

## 2016-09-19 DIAGNOSIS — J449 Chronic obstructive pulmonary disease, unspecified: Secondary | ICD-10-CM | POA: Diagnosis not present

## 2016-09-19 DIAGNOSIS — Z1389 Encounter for screening for other disorder: Secondary | ICD-10-CM | POA: Diagnosis not present

## 2016-09-19 DIAGNOSIS — Z7409 Other reduced mobility: Secondary | ICD-10-CM | POA: Diagnosis not present

## 2016-09-28 DIAGNOSIS — J449 Chronic obstructive pulmonary disease, unspecified: Secondary | ICD-10-CM | POA: Diagnosis not present

## 2016-10-24 DIAGNOSIS — J449 Chronic obstructive pulmonary disease, unspecified: Secondary | ICD-10-CM | POA: Diagnosis not present

## 2016-10-24 DIAGNOSIS — Z9981 Dependence on supplemental oxygen: Secondary | ICD-10-CM | POA: Diagnosis not present

## 2016-10-29 DIAGNOSIS — J449 Chronic obstructive pulmonary disease, unspecified: Secondary | ICD-10-CM | POA: Diagnosis not present

## 2016-11-14 DIAGNOSIS — J310 Chronic rhinitis: Secondary | ICD-10-CM | POA: Diagnosis not present

## 2016-11-14 DIAGNOSIS — E039 Hypothyroidism, unspecified: Secondary | ICD-10-CM | POA: Diagnosis not present

## 2016-11-24 DIAGNOSIS — Z9981 Dependence on supplemental oxygen: Secondary | ICD-10-CM | POA: Diagnosis not present

## 2016-11-24 DIAGNOSIS — J449 Chronic obstructive pulmonary disease, unspecified: Secondary | ICD-10-CM | POA: Diagnosis not present

## 2016-11-29 DIAGNOSIS — J449 Chronic obstructive pulmonary disease, unspecified: Secondary | ICD-10-CM | POA: Diagnosis not present

## 2016-12-24 DIAGNOSIS — Z9981 Dependence on supplemental oxygen: Secondary | ICD-10-CM | POA: Diagnosis not present

## 2016-12-24 DIAGNOSIS — J449 Chronic obstructive pulmonary disease, unspecified: Secondary | ICD-10-CM | POA: Diagnosis not present

## 2016-12-29 DIAGNOSIS — J449 Chronic obstructive pulmonary disease, unspecified: Secondary | ICD-10-CM | POA: Diagnosis not present

## 2017-01-24 DIAGNOSIS — Z9981 Dependence on supplemental oxygen: Secondary | ICD-10-CM | POA: Diagnosis not present

## 2017-01-24 DIAGNOSIS — J449 Chronic obstructive pulmonary disease, unspecified: Secondary | ICD-10-CM | POA: Diagnosis not present

## 2017-01-29 DIAGNOSIS — J449 Chronic obstructive pulmonary disease, unspecified: Secondary | ICD-10-CM | POA: Diagnosis not present

## 2017-02-14 DIAGNOSIS — Z23 Encounter for immunization: Secondary | ICD-10-CM | POA: Diagnosis not present

## 2017-02-14 DIAGNOSIS — E039 Hypothyroidism, unspecified: Secondary | ICD-10-CM | POA: Diagnosis not present

## 2017-02-14 DIAGNOSIS — Z9181 History of falling: Secondary | ICD-10-CM | POA: Diagnosis not present

## 2017-02-23 DIAGNOSIS — Z9981 Dependence on supplemental oxygen: Secondary | ICD-10-CM | POA: Diagnosis not present

## 2017-02-23 DIAGNOSIS — J449 Chronic obstructive pulmonary disease, unspecified: Secondary | ICD-10-CM | POA: Diagnosis not present

## 2017-02-25 DIAGNOSIS — K769 Liver disease, unspecified: Secondary | ICD-10-CM | POA: Diagnosis not present

## 2017-02-25 DIAGNOSIS — J439 Emphysema, unspecified: Secondary | ICD-10-CM | POA: Diagnosis not present

## 2017-02-25 DIAGNOSIS — C3481 Malignant neoplasm of overlapping sites of right bronchus and lung: Secondary | ICD-10-CM | POA: Diagnosis not present

## 2017-02-25 DIAGNOSIS — I700 Atherosclerosis of aorta: Secondary | ICD-10-CM | POA: Diagnosis not present

## 2017-02-25 DIAGNOSIS — C3491 Malignant neoplasm of unspecified part of right bronchus or lung: Secondary | ICD-10-CM | POA: Diagnosis not present

## 2017-02-26 DIAGNOSIS — Z923 Personal history of irradiation: Secondary | ICD-10-CM | POA: Diagnosis not present

## 2017-02-26 DIAGNOSIS — R9389 Abnormal findings on diagnostic imaging of other specified body structures: Secondary | ICD-10-CM | POA: Diagnosis not present

## 2017-02-26 DIAGNOSIS — C3490 Malignant neoplasm of unspecified part of unspecified bronchus or lung: Secondary | ICD-10-CM | POA: Diagnosis not present

## 2017-02-26 DIAGNOSIS — Z9221 Personal history of antineoplastic chemotherapy: Secondary | ICD-10-CM | POA: Diagnosis not present

## 2017-02-26 DIAGNOSIS — Z85038 Personal history of other malignant neoplasm of large intestine: Secondary | ICD-10-CM | POA: Diagnosis not present

## 2017-02-26 DIAGNOSIS — Z85118 Personal history of other malignant neoplasm of bronchus and lung: Secondary | ICD-10-CM | POA: Diagnosis not present

## 2017-02-26 DIAGNOSIS — R911 Solitary pulmonary nodule: Secondary | ICD-10-CM | POA: Diagnosis not present

## 2017-02-28 DIAGNOSIS — J449 Chronic obstructive pulmonary disease, unspecified: Secondary | ICD-10-CM | POA: Diagnosis not present

## 2017-03-26 DIAGNOSIS — Z9981 Dependence on supplemental oxygen: Secondary | ICD-10-CM | POA: Diagnosis not present

## 2017-03-26 DIAGNOSIS — J449 Chronic obstructive pulmonary disease, unspecified: Secondary | ICD-10-CM | POA: Diagnosis not present

## 2017-03-31 DIAGNOSIS — J449 Chronic obstructive pulmonary disease, unspecified: Secondary | ICD-10-CM | POA: Diagnosis not present

## 2017-04-18 DIAGNOSIS — R0602 Shortness of breath: Secondary | ICD-10-CM | POA: Diagnosis not present

## 2017-04-18 DIAGNOSIS — R05 Cough: Secondary | ICD-10-CM | POA: Diagnosis not present

## 2017-04-18 DIAGNOSIS — J449 Chronic obstructive pulmonary disease, unspecified: Secondary | ICD-10-CM | POA: Diagnosis not present

## 2017-04-18 DIAGNOSIS — R069 Unspecified abnormalities of breathing: Secondary | ICD-10-CM | POA: Diagnosis not present

## 2017-04-18 DIAGNOSIS — Z87891 Personal history of nicotine dependence: Secondary | ICD-10-CM | POA: Diagnosis not present

## 2017-04-26 DIAGNOSIS — Z9981 Dependence on supplemental oxygen: Secondary | ICD-10-CM | POA: Diagnosis not present

## 2017-04-26 DIAGNOSIS — J449 Chronic obstructive pulmonary disease, unspecified: Secondary | ICD-10-CM | POA: Diagnosis not present

## 2017-05-01 DIAGNOSIS — J449 Chronic obstructive pulmonary disease, unspecified: Secondary | ICD-10-CM | POA: Diagnosis not present

## 2017-05-20 DIAGNOSIS — J449 Chronic obstructive pulmonary disease, unspecified: Secondary | ICD-10-CM | POA: Diagnosis not present

## 2017-05-20 DIAGNOSIS — C3490 Malignant neoplasm of unspecified part of unspecified bronchus or lung: Secondary | ICD-10-CM | POA: Diagnosis not present

## 2017-05-20 DIAGNOSIS — M8580 Other specified disorders of bone density and structure, unspecified site: Secondary | ICD-10-CM | POA: Diagnosis not present

## 2017-05-20 DIAGNOSIS — K219 Gastro-esophageal reflux disease without esophagitis: Secondary | ICD-10-CM | POA: Diagnosis not present

## 2017-05-20 DIAGNOSIS — E039 Hypothyroidism, unspecified: Secondary | ICD-10-CM | POA: Diagnosis not present

## 2017-05-24 DIAGNOSIS — Z9981 Dependence on supplemental oxygen: Secondary | ICD-10-CM | POA: Diagnosis not present

## 2017-05-24 DIAGNOSIS — J449 Chronic obstructive pulmonary disease, unspecified: Secondary | ICD-10-CM | POA: Diagnosis not present

## 2017-05-27 DIAGNOSIS — D649 Anemia, unspecified: Secondary | ICD-10-CM | POA: Diagnosis not present

## 2017-05-27 DIAGNOSIS — C3481 Malignant neoplasm of overlapping sites of right bronchus and lung: Secondary | ICD-10-CM | POA: Diagnosis not present

## 2017-05-27 DIAGNOSIS — C3490 Malignant neoplasm of unspecified part of unspecified bronchus or lung: Secondary | ICD-10-CM | POA: Diagnosis not present

## 2017-05-29 DIAGNOSIS — Z85118 Personal history of other malignant neoplasm of bronchus and lung: Secondary | ICD-10-CM | POA: Diagnosis not present

## 2017-05-29 DIAGNOSIS — J449 Chronic obstructive pulmonary disease, unspecified: Secondary | ICD-10-CM | POA: Diagnosis not present

## 2017-06-24 DIAGNOSIS — Z9981 Dependence on supplemental oxygen: Secondary | ICD-10-CM | POA: Diagnosis not present

## 2017-06-24 DIAGNOSIS — J449 Chronic obstructive pulmonary disease, unspecified: Secondary | ICD-10-CM | POA: Diagnosis not present

## 2017-06-29 DIAGNOSIS — J449 Chronic obstructive pulmonary disease, unspecified: Secondary | ICD-10-CM | POA: Diagnosis not present

## 2017-07-24 DIAGNOSIS — Z9981 Dependence on supplemental oxygen: Secondary | ICD-10-CM | POA: Diagnosis not present

## 2017-07-24 DIAGNOSIS — J449 Chronic obstructive pulmonary disease, unspecified: Secondary | ICD-10-CM | POA: Diagnosis not present

## 2017-07-29 DIAGNOSIS — J449 Chronic obstructive pulmonary disease, unspecified: Secondary | ICD-10-CM | POA: Diagnosis not present

## 2017-08-19 DIAGNOSIS — E039 Hypothyroidism, unspecified: Secondary | ICD-10-CM | POA: Diagnosis not present

## 2017-08-19 DIAGNOSIS — J449 Chronic obstructive pulmonary disease, unspecified: Secondary | ICD-10-CM | POA: Diagnosis not present

## 2017-08-19 DIAGNOSIS — N3281 Overactive bladder: Secondary | ICD-10-CM | POA: Diagnosis not present

## 2017-08-19 DIAGNOSIS — C3490 Malignant neoplasm of unspecified part of unspecified bronchus or lung: Secondary | ICD-10-CM | POA: Diagnosis not present

## 2017-08-19 DIAGNOSIS — R600 Localized edema: Secondary | ICD-10-CM | POA: Diagnosis not present

## 2017-08-29 DIAGNOSIS — J449 Chronic obstructive pulmonary disease, unspecified: Secondary | ICD-10-CM | POA: Diagnosis not present

## 2017-09-24 DIAGNOSIS — M5137 Other intervertebral disc degeneration, lumbosacral region: Secondary | ICD-10-CM | POA: Diagnosis not present

## 2017-09-24 DIAGNOSIS — R600 Localized edema: Secondary | ICD-10-CM | POA: Diagnosis not present

## 2017-09-24 DIAGNOSIS — C3490 Malignant neoplasm of unspecified part of unspecified bronchus or lung: Secondary | ICD-10-CM | POA: Diagnosis not present

## 2017-09-24 DIAGNOSIS — Z1331 Encounter for screening for depression: Secondary | ICD-10-CM | POA: Diagnosis not present

## 2017-09-28 DIAGNOSIS — J449 Chronic obstructive pulmonary disease, unspecified: Secondary | ICD-10-CM | POA: Diagnosis not present

## 2017-10-02 DIAGNOSIS — M4150 Other secondary scoliosis, site unspecified: Secondary | ICD-10-CM | POA: Diagnosis not present

## 2017-10-02 DIAGNOSIS — M5116 Intervertebral disc disorders with radiculopathy, lumbar region: Secondary | ICD-10-CM | POA: Diagnosis not present

## 2017-10-11 DIAGNOSIS — M5125 Other intervertebral disc displacement, thoracolumbar region: Secondary | ICD-10-CM | POA: Diagnosis not present

## 2017-10-11 DIAGNOSIS — M545 Low back pain: Secondary | ICD-10-CM | POA: Diagnosis not present

## 2017-10-11 DIAGNOSIS — M48061 Spinal stenosis, lumbar region without neurogenic claudication: Secondary | ICD-10-CM | POA: Diagnosis not present

## 2017-10-11 DIAGNOSIS — M5136 Other intervertebral disc degeneration, lumbar region: Secondary | ICD-10-CM | POA: Diagnosis not present

## 2017-10-16 DIAGNOSIS — M4150 Other secondary scoliosis, site unspecified: Secondary | ICD-10-CM | POA: Diagnosis not present

## 2017-10-16 DIAGNOSIS — M5116 Intervertebral disc disorders with radiculopathy, lumbar region: Secondary | ICD-10-CM | POA: Diagnosis not present

## 2017-10-29 DIAGNOSIS — J449 Chronic obstructive pulmonary disease, unspecified: Secondary | ICD-10-CM | POA: Diagnosis not present

## 2017-11-29 DIAGNOSIS — J449 Chronic obstructive pulmonary disease, unspecified: Secondary | ICD-10-CM | POA: Diagnosis not present

## 2017-12-09 DIAGNOSIS — I700 Atherosclerosis of aorta: Secondary | ICD-10-CM | POA: Diagnosis not present

## 2017-12-09 DIAGNOSIS — J439 Emphysema, unspecified: Secondary | ICD-10-CM | POA: Diagnosis not present

## 2017-12-09 DIAGNOSIS — C3481 Malignant neoplasm of overlapping sites of right bronchus and lung: Secondary | ICD-10-CM | POA: Diagnosis not present

## 2017-12-09 DIAGNOSIS — C3490 Malignant neoplasm of unspecified part of unspecified bronchus or lung: Secondary | ICD-10-CM | POA: Diagnosis not present

## 2017-12-15 DIAGNOSIS — Z9119 Patient's noncompliance with other medical treatment and regimen: Secondary | ICD-10-CM | POA: Diagnosis not present

## 2017-12-15 DIAGNOSIS — Z85038 Personal history of other malignant neoplasm of large intestine: Secondary | ICD-10-CM | POA: Diagnosis not present

## 2017-12-15 DIAGNOSIS — Z85118 Personal history of other malignant neoplasm of bronchus and lung: Secondary | ICD-10-CM | POA: Diagnosis not present

## 2017-12-25 DIAGNOSIS — Z7689 Persons encountering health services in other specified circumstances: Secondary | ICD-10-CM | POA: Diagnosis not present

## 2017-12-25 DIAGNOSIS — R600 Localized edema: Secondary | ICD-10-CM | POA: Diagnosis not present

## 2017-12-25 DIAGNOSIS — J449 Chronic obstructive pulmonary disease, unspecified: Secondary | ICD-10-CM | POA: Diagnosis not present

## 2017-12-25 DIAGNOSIS — C3490 Malignant neoplasm of unspecified part of unspecified bronchus or lung: Secondary | ICD-10-CM | POA: Diagnosis not present

## 2017-12-25 DIAGNOSIS — Z23 Encounter for immunization: Secondary | ICD-10-CM | POA: Diagnosis not present

## 2017-12-25 DIAGNOSIS — M5137 Other intervertebral disc degeneration, lumbosacral region: Secondary | ICD-10-CM | POA: Diagnosis not present

## 2017-12-29 DIAGNOSIS — J449 Chronic obstructive pulmonary disease, unspecified: Secondary | ICD-10-CM | POA: Diagnosis not present

## 2018-01-08 DIAGNOSIS — Z1231 Encounter for screening mammogram for malignant neoplasm of breast: Secondary | ICD-10-CM | POA: Diagnosis not present

## 2018-01-20 DIAGNOSIS — Z1211 Encounter for screening for malignant neoplasm of colon: Secondary | ICD-10-CM | POA: Diagnosis not present

## 2018-01-20 DIAGNOSIS — Z1212 Encounter for screening for malignant neoplasm of rectum: Secondary | ICD-10-CM | POA: Diagnosis not present

## 2018-01-28 DIAGNOSIS — R922 Inconclusive mammogram: Secondary | ICD-10-CM | POA: Diagnosis not present

## 2018-01-28 DIAGNOSIS — N6314 Unspecified lump in the right breast, lower inner quadrant: Secondary | ICD-10-CM | POA: Diagnosis not present

## 2018-01-28 DIAGNOSIS — N6312 Unspecified lump in the right breast, upper inner quadrant: Secondary | ICD-10-CM | POA: Diagnosis not present

## 2018-01-28 DIAGNOSIS — N649 Disorder of breast, unspecified: Secondary | ICD-10-CM | POA: Diagnosis not present

## 2018-01-29 DIAGNOSIS — J449 Chronic obstructive pulmonary disease, unspecified: Secondary | ICD-10-CM | POA: Diagnosis not present

## 2018-02-28 DIAGNOSIS — J449 Chronic obstructive pulmonary disease, unspecified: Secondary | ICD-10-CM | POA: Diagnosis not present

## 2018-03-31 DIAGNOSIS — J449 Chronic obstructive pulmonary disease, unspecified: Secondary | ICD-10-CM | POA: Diagnosis not present

## 2018-04-06 DIAGNOSIS — C3490 Malignant neoplasm of unspecified part of unspecified bronchus or lung: Secondary | ICD-10-CM | POA: Diagnosis not present

## 2018-04-06 DIAGNOSIS — M5137 Other intervertebral disc degeneration, lumbosacral region: Secondary | ICD-10-CM | POA: Diagnosis not present

## 2018-04-06 DIAGNOSIS — J449 Chronic obstructive pulmonary disease, unspecified: Secondary | ICD-10-CM | POA: Diagnosis not present

## 2018-04-06 DIAGNOSIS — E039 Hypothyroidism, unspecified: Secondary | ICD-10-CM | POA: Diagnosis not present

## 2018-04-06 DIAGNOSIS — R600 Localized edema: Secondary | ICD-10-CM | POA: Diagnosis not present

## 2018-05-01 DIAGNOSIS — J449 Chronic obstructive pulmonary disease, unspecified: Secondary | ICD-10-CM | POA: Diagnosis not present

## 2018-05-30 DIAGNOSIS — J449 Chronic obstructive pulmonary disease, unspecified: Secondary | ICD-10-CM | POA: Diagnosis not present

## 2018-06-30 DIAGNOSIS — J449 Chronic obstructive pulmonary disease, unspecified: Secondary | ICD-10-CM | POA: Diagnosis not present

## 2018-07-06 DIAGNOSIS — E039 Hypothyroidism, unspecified: Secondary | ICD-10-CM | POA: Diagnosis not present

## 2018-07-06 DIAGNOSIS — J449 Chronic obstructive pulmonary disease, unspecified: Secondary | ICD-10-CM | POA: Diagnosis not present

## 2018-07-06 DIAGNOSIS — M5137 Other intervertebral disc degeneration, lumbosacral region: Secondary | ICD-10-CM | POA: Diagnosis not present

## 2018-07-06 DIAGNOSIS — R600 Localized edema: Secondary | ICD-10-CM | POA: Diagnosis not present

## 2018-07-06 DIAGNOSIS — R195 Other fecal abnormalities: Secondary | ICD-10-CM | POA: Diagnosis not present

## 2018-07-30 DIAGNOSIS — J449 Chronic obstructive pulmonary disease, unspecified: Secondary | ICD-10-CM | POA: Diagnosis not present

## 2018-08-06 DIAGNOSIS — M5137 Other intervertebral disc degeneration, lumbosacral region: Secondary | ICD-10-CM | POA: Diagnosis not present

## 2018-08-06 DIAGNOSIS — R195 Other fecal abnormalities: Secondary | ICD-10-CM | POA: Diagnosis not present

## 2018-08-06 DIAGNOSIS — J449 Chronic obstructive pulmonary disease, unspecified: Secondary | ICD-10-CM | POA: Diagnosis not present

## 2018-08-06 DIAGNOSIS — E039 Hypothyroidism, unspecified: Secondary | ICD-10-CM | POA: Diagnosis not present

## 2018-08-06 DIAGNOSIS — R600 Localized edema: Secondary | ICD-10-CM | POA: Diagnosis not present

## 2018-08-30 DIAGNOSIS — J449 Chronic obstructive pulmonary disease, unspecified: Secondary | ICD-10-CM | POA: Diagnosis not present

## 2018-09-14 DIAGNOSIS — M5137 Other intervertebral disc degeneration, lumbosacral region: Secondary | ICD-10-CM | POA: Diagnosis not present

## 2018-09-14 DIAGNOSIS — J449 Chronic obstructive pulmonary disease, unspecified: Secondary | ICD-10-CM | POA: Diagnosis not present

## 2018-09-14 DIAGNOSIS — R600 Localized edema: Secondary | ICD-10-CM | POA: Diagnosis not present

## 2018-09-14 DIAGNOSIS — E039 Hypothyroidism, unspecified: Secondary | ICD-10-CM | POA: Diagnosis not present

## 2018-09-14 DIAGNOSIS — R195 Other fecal abnormalities: Secondary | ICD-10-CM | POA: Diagnosis not present

## 2018-09-29 DIAGNOSIS — J449 Chronic obstructive pulmonary disease, unspecified: Secondary | ICD-10-CM | POA: Diagnosis not present

## 2018-10-02 DIAGNOSIS — Z85038 Personal history of other malignant neoplasm of large intestine: Secondary | ICD-10-CM | POA: Diagnosis not present

## 2018-10-02 DIAGNOSIS — D126 Benign neoplasm of colon, unspecified: Secondary | ICD-10-CM | POA: Diagnosis not present

## 2018-10-02 DIAGNOSIS — K5730 Diverticulosis of large intestine without perforation or abscess without bleeding: Secondary | ICD-10-CM | POA: Diagnosis not present

## 2018-10-02 DIAGNOSIS — K621 Rectal polyp: Secondary | ICD-10-CM | POA: Diagnosis not present

## 2018-10-02 DIAGNOSIS — R195 Other fecal abnormalities: Secondary | ICD-10-CM | POA: Diagnosis not present

## 2018-10-02 DIAGNOSIS — Z86010 Personal history of colonic polyps: Secondary | ICD-10-CM | POA: Diagnosis not present

## 2018-10-02 DIAGNOSIS — K635 Polyp of colon: Secondary | ICD-10-CM | POA: Diagnosis not present

## 2018-10-15 DIAGNOSIS — R600 Localized edema: Secondary | ICD-10-CM | POA: Diagnosis not present

## 2018-10-15 DIAGNOSIS — Z139 Encounter for screening, unspecified: Secondary | ICD-10-CM | POA: Diagnosis not present

## 2018-10-15 DIAGNOSIS — J449 Chronic obstructive pulmonary disease, unspecified: Secondary | ICD-10-CM | POA: Diagnosis not present

## 2018-10-15 DIAGNOSIS — M5137 Other intervertebral disc degeneration, lumbosacral region: Secondary | ICD-10-CM | POA: Diagnosis not present

## 2018-10-15 DIAGNOSIS — E039 Hypothyroidism, unspecified: Secondary | ICD-10-CM | POA: Diagnosis not present

## 2018-10-15 DIAGNOSIS — Z9181 History of falling: Secondary | ICD-10-CM | POA: Diagnosis not present

## 2018-10-21 DIAGNOSIS — R600 Localized edema: Secondary | ICD-10-CM | POA: Diagnosis not present

## 2018-10-21 DIAGNOSIS — E039 Hypothyroidism, unspecified: Secondary | ICD-10-CM | POA: Diagnosis not present

## 2018-10-21 DIAGNOSIS — Z13220 Encounter for screening for lipoid disorders: Secondary | ICD-10-CM | POA: Diagnosis not present

## 2018-10-21 DIAGNOSIS — E559 Vitamin D deficiency, unspecified: Secondary | ICD-10-CM | POA: Diagnosis not present

## 2018-10-30 DIAGNOSIS — J449 Chronic obstructive pulmonary disease, unspecified: Secondary | ICD-10-CM | POA: Diagnosis not present

## 2018-11-30 DIAGNOSIS — J449 Chronic obstructive pulmonary disease, unspecified: Secondary | ICD-10-CM | POA: Diagnosis not present

## 2018-12-30 DIAGNOSIS — J449 Chronic obstructive pulmonary disease, unspecified: Secondary | ICD-10-CM | POA: Diagnosis not present

## 2019-01-14 DIAGNOSIS — N649 Disorder of breast, unspecified: Secondary | ICD-10-CM | POA: Diagnosis not present

## 2019-01-14 DIAGNOSIS — R928 Other abnormal and inconclusive findings on diagnostic imaging of breast: Secondary | ICD-10-CM | POA: Diagnosis not present

## 2019-01-14 DIAGNOSIS — N6312 Unspecified lump in the right breast, upper inner quadrant: Secondary | ICD-10-CM | POA: Diagnosis not present

## 2019-01-14 DIAGNOSIS — R922 Inconclusive mammogram: Secondary | ICD-10-CM | POA: Diagnosis not present

## 2019-01-19 DIAGNOSIS — R918 Other nonspecific abnormal finding of lung field: Secondary | ICD-10-CM | POA: Diagnosis not present

## 2019-01-19 DIAGNOSIS — C187 Malignant neoplasm of sigmoid colon: Secondary | ICD-10-CM | POA: Diagnosis not present

## 2019-01-19 DIAGNOSIS — C3481 Malignant neoplasm of overlapping sites of right bronchus and lung: Secondary | ICD-10-CM | POA: Diagnosis not present

## 2019-01-22 DIAGNOSIS — J449 Chronic obstructive pulmonary disease, unspecified: Secondary | ICD-10-CM | POA: Diagnosis not present

## 2019-01-22 DIAGNOSIS — Z9981 Dependence on supplemental oxygen: Secondary | ICD-10-CM | POA: Diagnosis not present

## 2019-01-22 DIAGNOSIS — Z85038 Personal history of other malignant neoplasm of large intestine: Secondary | ICD-10-CM | POA: Diagnosis not present

## 2019-01-22 DIAGNOSIS — Z85118 Personal history of other malignant neoplasm of bronchus and lung: Secondary | ICD-10-CM | POA: Diagnosis not present

## 2019-01-30 DIAGNOSIS — J449 Chronic obstructive pulmonary disease, unspecified: Secondary | ICD-10-CM | POA: Diagnosis not present

## 2019-02-15 DIAGNOSIS — J449 Chronic obstructive pulmonary disease, unspecified: Secondary | ICD-10-CM | POA: Diagnosis not present

## 2019-02-15 DIAGNOSIS — C3490 Malignant neoplasm of unspecified part of unspecified bronchus or lung: Secondary | ICD-10-CM | POA: Diagnosis not present

## 2019-02-15 DIAGNOSIS — E559 Vitamin D deficiency, unspecified: Secondary | ICD-10-CM | POA: Diagnosis not present

## 2019-02-15 DIAGNOSIS — E039 Hypothyroidism, unspecified: Secondary | ICD-10-CM | POA: Diagnosis not present

## 2019-02-15 DIAGNOSIS — R600 Localized edema: Secondary | ICD-10-CM | POA: Diagnosis not present

## 2019-03-01 DIAGNOSIS — J449 Chronic obstructive pulmonary disease, unspecified: Secondary | ICD-10-CM | POA: Diagnosis not present

## 2019-04-01 DIAGNOSIS — J449 Chronic obstructive pulmonary disease, unspecified: Secondary | ICD-10-CM | POA: Diagnosis not present

## 2019-04-01 DIAGNOSIS — M62830 Muscle spasm of back: Secondary | ICD-10-CM | POA: Diagnosis not present

## 2019-04-01 DIAGNOSIS — Z23 Encounter for immunization: Secondary | ICD-10-CM | POA: Diagnosis not present

## 2019-04-29 DIAGNOSIS — R1011 Right upper quadrant pain: Secondary | ICD-10-CM | POA: Diagnosis not present

## 2019-05-02 DIAGNOSIS — J449 Chronic obstructive pulmonary disease, unspecified: Secondary | ICD-10-CM | POA: Diagnosis not present

## 2019-05-04 DIAGNOSIS — R1011 Right upper quadrant pain: Secondary | ICD-10-CM | POA: Diagnosis not present

## 2019-05-18 DIAGNOSIS — Z9181 History of falling: Secondary | ICD-10-CM | POA: Diagnosis not present

## 2019-05-18 DIAGNOSIS — N959 Unspecified menopausal and perimenopausal disorder: Secondary | ICD-10-CM | POA: Diagnosis not present

## 2019-05-18 DIAGNOSIS — Z0000 Encounter for general adult medical examination without abnormal findings: Secondary | ICD-10-CM | POA: Diagnosis not present

## 2019-05-18 DIAGNOSIS — E785 Hyperlipidemia, unspecified: Secondary | ICD-10-CM | POA: Diagnosis not present

## 2019-05-20 DIAGNOSIS — E039 Hypothyroidism, unspecified: Secondary | ICD-10-CM | POA: Diagnosis not present

## 2019-05-20 DIAGNOSIS — M5137 Other intervertebral disc degeneration, lumbosacral region: Secondary | ICD-10-CM | POA: Diagnosis not present

## 2019-05-20 DIAGNOSIS — J449 Chronic obstructive pulmonary disease, unspecified: Secondary | ICD-10-CM | POA: Diagnosis not present

## 2019-05-20 DIAGNOSIS — E559 Vitamin D deficiency, unspecified: Secondary | ICD-10-CM | POA: Diagnosis not present

## 2019-05-20 DIAGNOSIS — E785 Hyperlipidemia, unspecified: Secondary | ICD-10-CM | POA: Diagnosis not present

## 2019-05-20 DIAGNOSIS — R600 Localized edema: Secondary | ICD-10-CM | POA: Diagnosis not present

## 2019-05-30 DIAGNOSIS — J449 Chronic obstructive pulmonary disease, unspecified: Secondary | ICD-10-CM | POA: Diagnosis not present

## 2019-06-09 DIAGNOSIS — M810 Age-related osteoporosis without current pathological fracture: Secondary | ICD-10-CM | POA: Diagnosis not present

## 2019-06-09 DIAGNOSIS — N959 Unspecified menopausal and perimenopausal disorder: Secondary | ICD-10-CM | POA: Diagnosis not present

## 2019-08-20 DIAGNOSIS — M5137 Other intervertebral disc degeneration, lumbosacral region: Secondary | ICD-10-CM | POA: Diagnosis not present

## 2019-08-20 DIAGNOSIS — J449 Chronic obstructive pulmonary disease, unspecified: Secondary | ICD-10-CM | POA: Diagnosis not present

## 2019-08-20 DIAGNOSIS — E559 Vitamin D deficiency, unspecified: Secondary | ICD-10-CM | POA: Diagnosis not present

## 2019-08-20 DIAGNOSIS — E785 Hyperlipidemia, unspecified: Secondary | ICD-10-CM | POA: Diagnosis not present

## 2019-08-20 DIAGNOSIS — E039 Hypothyroidism, unspecified: Secondary | ICD-10-CM | POA: Diagnosis not present

## 2019-10-07 DIAGNOSIS — R030 Elevated blood-pressure reading, without diagnosis of hypertension: Secondary | ICD-10-CM | POA: Diagnosis not present

## 2019-10-07 DIAGNOSIS — Z9981 Dependence on supplemental oxygen: Secondary | ICD-10-CM | POA: Diagnosis not present

## 2019-10-07 DIAGNOSIS — M1990 Unspecified osteoarthritis, unspecified site: Secondary | ICD-10-CM | POA: Diagnosis not present

## 2019-10-07 DIAGNOSIS — R069 Unspecified abnormalities of breathing: Secondary | ICD-10-CM | POA: Diagnosis not present

## 2019-10-07 DIAGNOSIS — J9621 Acute and chronic respiratory failure with hypoxia: Secondary | ICD-10-CM | POA: Diagnosis not present

## 2019-10-07 DIAGNOSIS — E559 Vitamin D deficiency, unspecified: Secondary | ICD-10-CM | POA: Diagnosis not present

## 2019-10-07 DIAGNOSIS — Z85118 Personal history of other malignant neoplasm of bronchus and lung: Secondary | ICD-10-CM | POA: Diagnosis not present

## 2019-10-07 DIAGNOSIS — R262 Difficulty in walking, not elsewhere classified: Secondary | ICD-10-CM | POA: Diagnosis not present

## 2019-10-07 DIAGNOSIS — R140 Abdominal distension (gaseous): Secondary | ICD-10-CM | POA: Diagnosis not present

## 2019-10-07 DIAGNOSIS — E039 Hypothyroidism, unspecified: Secondary | ICD-10-CM | POA: Diagnosis not present

## 2019-10-07 DIAGNOSIS — R5381 Other malaise: Secondary | ICD-10-CM | POA: Diagnosis not present

## 2019-10-07 DIAGNOSIS — K7689 Other specified diseases of liver: Secondary | ICD-10-CM | POA: Diagnosis not present

## 2019-10-07 DIAGNOSIS — E871 Hypo-osmolality and hyponatremia: Secondary | ICD-10-CM | POA: Diagnosis not present

## 2019-10-07 DIAGNOSIS — I517 Cardiomegaly: Secondary | ICD-10-CM | POA: Diagnosis not present

## 2019-10-07 DIAGNOSIS — E875 Hyperkalemia: Secondary | ICD-10-CM | POA: Diagnosis not present

## 2019-10-07 DIAGNOSIS — K449 Diaphragmatic hernia without obstruction or gangrene: Secondary | ICD-10-CM | POA: Diagnosis not present

## 2019-10-07 DIAGNOSIS — R918 Other nonspecific abnormal finding of lung field: Secondary | ICD-10-CM | POA: Diagnosis not present

## 2019-10-07 DIAGNOSIS — J929 Pleural plaque without asbestos: Secondary | ICD-10-CM | POA: Diagnosis not present

## 2019-10-07 DIAGNOSIS — Z743 Need for continuous supervision: Secondary | ICD-10-CM | POA: Diagnosis not present

## 2019-10-07 DIAGNOSIS — K5730 Diverticulosis of large intestine without perforation or abscess without bleeding: Secondary | ICD-10-CM | POA: Diagnosis not present

## 2019-10-07 DIAGNOSIS — J90 Pleural effusion, not elsewhere classified: Secondary | ICD-10-CM | POA: Diagnosis not present

## 2019-10-07 DIAGNOSIS — Z87891 Personal history of nicotine dependence: Secondary | ICD-10-CM | POA: Diagnosis not present

## 2019-10-07 DIAGNOSIS — I700 Atherosclerosis of aorta: Secondary | ICD-10-CM | POA: Diagnosis not present

## 2019-10-07 DIAGNOSIS — J9811 Atelectasis: Secondary | ICD-10-CM | POA: Diagnosis not present

## 2019-10-07 DIAGNOSIS — J441 Chronic obstructive pulmonary disease with (acute) exacerbation: Secondary | ICD-10-CM | POA: Diagnosis not present

## 2019-10-07 DIAGNOSIS — R0602 Shortness of breath: Secondary | ICD-10-CM | POA: Diagnosis not present

## 2019-10-07 DIAGNOSIS — K219 Gastro-esophageal reflux disease without esophagitis: Secondary | ICD-10-CM | POA: Diagnosis not present

## 2019-10-07 DIAGNOSIS — R0603 Acute respiratory distress: Secondary | ICD-10-CM | POA: Diagnosis not present

## 2019-10-08 DIAGNOSIS — J929 Pleural plaque without asbestos: Secondary | ICD-10-CM | POA: Diagnosis not present

## 2019-10-08 DIAGNOSIS — K7689 Other specified diseases of liver: Secondary | ICD-10-CM | POA: Diagnosis not present

## 2019-10-08 DIAGNOSIS — K449 Diaphragmatic hernia without obstruction or gangrene: Secondary | ICD-10-CM | POA: Diagnosis not present

## 2019-10-08 DIAGNOSIS — J441 Chronic obstructive pulmonary disease with (acute) exacerbation: Secondary | ICD-10-CM | POA: Diagnosis not present

## 2019-10-08 DIAGNOSIS — J9621 Acute and chronic respiratory failure with hypoxia: Secondary | ICD-10-CM | POA: Diagnosis not present

## 2019-10-08 DIAGNOSIS — R918 Other nonspecific abnormal finding of lung field: Secondary | ICD-10-CM | POA: Diagnosis not present

## 2019-10-08 DIAGNOSIS — K5730 Diverticulosis of large intestine without perforation or abscess without bleeding: Secondary | ICD-10-CM | POA: Diagnosis not present

## 2019-10-08 DIAGNOSIS — I700 Atherosclerosis of aorta: Secondary | ICD-10-CM | POA: Diagnosis not present

## 2019-10-08 DIAGNOSIS — I517 Cardiomegaly: Secondary | ICD-10-CM | POA: Diagnosis not present

## 2019-10-08 DIAGNOSIS — R0603 Acute respiratory distress: Secondary | ICD-10-CM | POA: Diagnosis not present

## 2019-10-09 DIAGNOSIS — J441 Chronic obstructive pulmonary disease with (acute) exacerbation: Secondary | ICD-10-CM | POA: Diagnosis not present

## 2019-10-09 DIAGNOSIS — J9621 Acute and chronic respiratory failure with hypoxia: Secondary | ICD-10-CM | POA: Diagnosis not present

## 2019-10-10 DIAGNOSIS — J9621 Acute and chronic respiratory failure with hypoxia: Secondary | ICD-10-CM | POA: Diagnosis not present

## 2019-10-10 DIAGNOSIS — J441 Chronic obstructive pulmonary disease with (acute) exacerbation: Secondary | ICD-10-CM | POA: Diagnosis not present

## 2019-10-11 DIAGNOSIS — J441 Chronic obstructive pulmonary disease with (acute) exacerbation: Secondary | ICD-10-CM | POA: Diagnosis not present

## 2019-10-11 DIAGNOSIS — J9621 Acute and chronic respiratory failure with hypoxia: Secondary | ICD-10-CM | POA: Diagnosis not present

## 2019-10-12 DIAGNOSIS — Z9181 History of falling: Secondary | ICD-10-CM | POA: Diagnosis not present

## 2019-10-12 DIAGNOSIS — J449 Chronic obstructive pulmonary disease, unspecified: Secondary | ICD-10-CM | POA: Diagnosis not present

## 2019-10-12 DIAGNOSIS — Z85118 Personal history of other malignant neoplasm of bronchus and lung: Secondary | ICD-10-CM | POA: Diagnosis not present

## 2019-10-12 DIAGNOSIS — J9621 Acute and chronic respiratory failure with hypoxia: Secondary | ICD-10-CM | POA: Diagnosis not present

## 2019-10-12 DIAGNOSIS — Z85038 Personal history of other malignant neoplasm of large intestine: Secondary | ICD-10-CM | POA: Diagnosis not present

## 2019-10-12 DIAGNOSIS — K219 Gastro-esophageal reflux disease without esophagitis: Secondary | ICD-10-CM | POA: Diagnosis not present

## 2019-10-12 DIAGNOSIS — E559 Vitamin D deficiency, unspecified: Secondary | ICD-10-CM | POA: Diagnosis not present

## 2019-10-12 DIAGNOSIS — Z9981 Dependence on supplemental oxygen: Secondary | ICD-10-CM | POA: Diagnosis not present

## 2019-10-12 DIAGNOSIS — N189 Chronic kidney disease, unspecified: Secondary | ICD-10-CM | POA: Diagnosis not present

## 2019-10-12 DIAGNOSIS — Z87891 Personal history of nicotine dependence: Secondary | ICD-10-CM | POA: Diagnosis not present

## 2019-10-13 DIAGNOSIS — Z9181 History of falling: Secondary | ICD-10-CM | POA: Diagnosis not present

## 2019-10-13 DIAGNOSIS — K219 Gastro-esophageal reflux disease without esophagitis: Secondary | ICD-10-CM | POA: Diagnosis not present

## 2019-10-13 DIAGNOSIS — Z9981 Dependence on supplemental oxygen: Secondary | ICD-10-CM | POA: Diagnosis not present

## 2019-10-13 DIAGNOSIS — J9621 Acute and chronic respiratory failure with hypoxia: Secondary | ICD-10-CM | POA: Diagnosis not present

## 2019-10-13 DIAGNOSIS — Z87891 Personal history of nicotine dependence: Secondary | ICD-10-CM | POA: Diagnosis not present

## 2019-10-13 DIAGNOSIS — Z85118 Personal history of other malignant neoplasm of bronchus and lung: Secondary | ICD-10-CM | POA: Diagnosis not present

## 2019-10-13 DIAGNOSIS — E559 Vitamin D deficiency, unspecified: Secondary | ICD-10-CM | POA: Diagnosis not present

## 2019-10-13 DIAGNOSIS — N189 Chronic kidney disease, unspecified: Secondary | ICD-10-CM | POA: Diagnosis not present

## 2019-10-13 DIAGNOSIS — J449 Chronic obstructive pulmonary disease, unspecified: Secondary | ICD-10-CM | POA: Diagnosis not present

## 2019-10-13 DIAGNOSIS — Z85038 Personal history of other malignant neoplasm of large intestine: Secondary | ICD-10-CM | POA: Diagnosis not present

## 2019-10-15 DIAGNOSIS — Z9981 Dependence on supplemental oxygen: Secondary | ICD-10-CM | POA: Diagnosis not present

## 2019-10-15 DIAGNOSIS — J449 Chronic obstructive pulmonary disease, unspecified: Secondary | ICD-10-CM | POA: Diagnosis not present

## 2019-10-15 DIAGNOSIS — J9621 Acute and chronic respiratory failure with hypoxia: Secondary | ICD-10-CM | POA: Diagnosis not present

## 2019-10-15 DIAGNOSIS — N189 Chronic kidney disease, unspecified: Secondary | ICD-10-CM | POA: Diagnosis not present

## 2019-10-15 DIAGNOSIS — K219 Gastro-esophageal reflux disease without esophagitis: Secondary | ICD-10-CM | POA: Diagnosis not present

## 2019-10-15 DIAGNOSIS — Z87891 Personal history of nicotine dependence: Secondary | ICD-10-CM | POA: Diagnosis not present

## 2019-10-15 DIAGNOSIS — Z85038 Personal history of other malignant neoplasm of large intestine: Secondary | ICD-10-CM | POA: Diagnosis not present

## 2019-10-15 DIAGNOSIS — Z9181 History of falling: Secondary | ICD-10-CM | POA: Diagnosis not present

## 2019-10-15 DIAGNOSIS — E559 Vitamin D deficiency, unspecified: Secondary | ICD-10-CM | POA: Diagnosis not present

## 2019-10-15 DIAGNOSIS — Z85118 Personal history of other malignant neoplasm of bronchus and lung: Secondary | ICD-10-CM | POA: Diagnosis not present

## 2019-10-19 DIAGNOSIS — E559 Vitamin D deficiency, unspecified: Secondary | ICD-10-CM | POA: Diagnosis not present

## 2019-10-19 DIAGNOSIS — Z87891 Personal history of nicotine dependence: Secondary | ICD-10-CM | POA: Diagnosis not present

## 2019-10-19 DIAGNOSIS — Z85038 Personal history of other malignant neoplasm of large intestine: Secondary | ICD-10-CM | POA: Diagnosis not present

## 2019-10-19 DIAGNOSIS — J449 Chronic obstructive pulmonary disease, unspecified: Secondary | ICD-10-CM | POA: Diagnosis not present

## 2019-10-19 DIAGNOSIS — N189 Chronic kidney disease, unspecified: Secondary | ICD-10-CM | POA: Diagnosis not present

## 2019-10-19 DIAGNOSIS — Z9981 Dependence on supplemental oxygen: Secondary | ICD-10-CM | POA: Diagnosis not present

## 2019-10-19 DIAGNOSIS — K219 Gastro-esophageal reflux disease without esophagitis: Secondary | ICD-10-CM | POA: Diagnosis not present

## 2019-10-19 DIAGNOSIS — J9621 Acute and chronic respiratory failure with hypoxia: Secondary | ICD-10-CM | POA: Diagnosis not present

## 2019-10-19 DIAGNOSIS — Z9181 History of falling: Secondary | ICD-10-CM | POA: Diagnosis not present

## 2019-10-19 DIAGNOSIS — Z85118 Personal history of other malignant neoplasm of bronchus and lung: Secondary | ICD-10-CM | POA: Diagnosis not present

## 2019-10-20 DIAGNOSIS — Z87891 Personal history of nicotine dependence: Secondary | ICD-10-CM | POA: Diagnosis not present

## 2019-10-20 DIAGNOSIS — Z9981 Dependence on supplemental oxygen: Secondary | ICD-10-CM | POA: Diagnosis not present

## 2019-10-20 DIAGNOSIS — E559 Vitamin D deficiency, unspecified: Secondary | ICD-10-CM | POA: Diagnosis not present

## 2019-10-20 DIAGNOSIS — Z85118 Personal history of other malignant neoplasm of bronchus and lung: Secondary | ICD-10-CM | POA: Diagnosis not present

## 2019-10-20 DIAGNOSIS — Z85038 Personal history of other malignant neoplasm of large intestine: Secondary | ICD-10-CM | POA: Diagnosis not present

## 2019-10-20 DIAGNOSIS — J449 Chronic obstructive pulmonary disease, unspecified: Secondary | ICD-10-CM | POA: Diagnosis not present

## 2019-10-20 DIAGNOSIS — K219 Gastro-esophageal reflux disease without esophagitis: Secondary | ICD-10-CM | POA: Diagnosis not present

## 2019-10-20 DIAGNOSIS — Z9181 History of falling: Secondary | ICD-10-CM | POA: Diagnosis not present

## 2019-10-20 DIAGNOSIS — N189 Chronic kidney disease, unspecified: Secondary | ICD-10-CM | POA: Diagnosis not present

## 2019-10-20 DIAGNOSIS — J9621 Acute and chronic respiratory failure with hypoxia: Secondary | ICD-10-CM | POA: Diagnosis not present

## 2019-10-22 DIAGNOSIS — K219 Gastro-esophageal reflux disease without esophagitis: Secondary | ICD-10-CM | POA: Diagnosis not present

## 2019-10-22 DIAGNOSIS — J449 Chronic obstructive pulmonary disease, unspecified: Secondary | ICD-10-CM | POA: Diagnosis not present

## 2019-10-22 DIAGNOSIS — E559 Vitamin D deficiency, unspecified: Secondary | ICD-10-CM | POA: Diagnosis not present

## 2019-10-22 DIAGNOSIS — Z9981 Dependence on supplemental oxygen: Secondary | ICD-10-CM | POA: Diagnosis not present

## 2019-10-22 DIAGNOSIS — Z87891 Personal history of nicotine dependence: Secondary | ICD-10-CM | POA: Diagnosis not present

## 2019-10-22 DIAGNOSIS — Z85118 Personal history of other malignant neoplasm of bronchus and lung: Secondary | ICD-10-CM | POA: Diagnosis not present

## 2019-10-22 DIAGNOSIS — N189 Chronic kidney disease, unspecified: Secondary | ICD-10-CM | POA: Diagnosis not present

## 2019-10-22 DIAGNOSIS — Z85038 Personal history of other malignant neoplasm of large intestine: Secondary | ICD-10-CM | POA: Diagnosis not present

## 2019-10-22 DIAGNOSIS — Z9181 History of falling: Secondary | ICD-10-CM | POA: Diagnosis not present

## 2019-10-22 DIAGNOSIS — J9621 Acute and chronic respiratory failure with hypoxia: Secondary | ICD-10-CM | POA: Diagnosis not present

## 2019-10-25 DIAGNOSIS — Z9181 History of falling: Secondary | ICD-10-CM | POA: Diagnosis not present

## 2019-10-25 DIAGNOSIS — N189 Chronic kidney disease, unspecified: Secondary | ICD-10-CM | POA: Diagnosis not present

## 2019-10-25 DIAGNOSIS — Z85118 Personal history of other malignant neoplasm of bronchus and lung: Secondary | ICD-10-CM | POA: Diagnosis not present

## 2019-10-25 DIAGNOSIS — Z85038 Personal history of other malignant neoplasm of large intestine: Secondary | ICD-10-CM | POA: Diagnosis not present

## 2019-10-25 DIAGNOSIS — Z9981 Dependence on supplemental oxygen: Secondary | ICD-10-CM | POA: Diagnosis not present

## 2019-10-25 DIAGNOSIS — K219 Gastro-esophageal reflux disease without esophagitis: Secondary | ICD-10-CM | POA: Diagnosis not present

## 2019-10-25 DIAGNOSIS — E559 Vitamin D deficiency, unspecified: Secondary | ICD-10-CM | POA: Diagnosis not present

## 2019-10-25 DIAGNOSIS — J449 Chronic obstructive pulmonary disease, unspecified: Secondary | ICD-10-CM | POA: Diagnosis not present

## 2019-10-25 DIAGNOSIS — Z87891 Personal history of nicotine dependence: Secondary | ICD-10-CM | POA: Diagnosis not present

## 2019-10-25 DIAGNOSIS — J9621 Acute and chronic respiratory failure with hypoxia: Secondary | ICD-10-CM | POA: Diagnosis not present

## 2019-10-26 DIAGNOSIS — J9621 Acute and chronic respiratory failure with hypoxia: Secondary | ICD-10-CM | POA: Diagnosis not present

## 2019-10-26 DIAGNOSIS — Z87891 Personal history of nicotine dependence: Secondary | ICD-10-CM | POA: Diagnosis not present

## 2019-10-26 DIAGNOSIS — Z9181 History of falling: Secondary | ICD-10-CM | POA: Diagnosis not present

## 2019-10-26 DIAGNOSIS — K219 Gastro-esophageal reflux disease without esophagitis: Secondary | ICD-10-CM | POA: Diagnosis not present

## 2019-10-26 DIAGNOSIS — N189 Chronic kidney disease, unspecified: Secondary | ICD-10-CM | POA: Diagnosis not present

## 2019-10-26 DIAGNOSIS — Z9981 Dependence on supplemental oxygen: Secondary | ICD-10-CM | POA: Diagnosis not present

## 2019-10-26 DIAGNOSIS — Z85118 Personal history of other malignant neoplasm of bronchus and lung: Secondary | ICD-10-CM | POA: Diagnosis not present

## 2019-10-26 DIAGNOSIS — E559 Vitamin D deficiency, unspecified: Secondary | ICD-10-CM | POA: Diagnosis not present

## 2019-10-26 DIAGNOSIS — Z85038 Personal history of other malignant neoplasm of large intestine: Secondary | ICD-10-CM | POA: Diagnosis not present

## 2019-10-26 DIAGNOSIS — J449 Chronic obstructive pulmonary disease, unspecified: Secondary | ICD-10-CM | POA: Diagnosis not present

## 2019-10-27 DIAGNOSIS — E559 Vitamin D deficiency, unspecified: Secondary | ICD-10-CM | POA: Diagnosis not present

## 2019-10-27 DIAGNOSIS — K219 Gastro-esophageal reflux disease without esophagitis: Secondary | ICD-10-CM | POA: Diagnosis not present

## 2019-10-27 DIAGNOSIS — J449 Chronic obstructive pulmonary disease, unspecified: Secondary | ICD-10-CM | POA: Diagnosis not present

## 2019-10-27 DIAGNOSIS — Z87891 Personal history of nicotine dependence: Secondary | ICD-10-CM | POA: Diagnosis not present

## 2019-10-27 DIAGNOSIS — Z9981 Dependence on supplemental oxygen: Secondary | ICD-10-CM | POA: Diagnosis not present

## 2019-10-27 DIAGNOSIS — Z9181 History of falling: Secondary | ICD-10-CM | POA: Diagnosis not present

## 2019-10-27 DIAGNOSIS — N189 Chronic kidney disease, unspecified: Secondary | ICD-10-CM | POA: Diagnosis not present

## 2019-10-27 DIAGNOSIS — J9621 Acute and chronic respiratory failure with hypoxia: Secondary | ICD-10-CM | POA: Diagnosis not present

## 2019-10-27 DIAGNOSIS — Z85118 Personal history of other malignant neoplasm of bronchus and lung: Secondary | ICD-10-CM | POA: Diagnosis not present

## 2019-10-27 DIAGNOSIS — Z85038 Personal history of other malignant neoplasm of large intestine: Secondary | ICD-10-CM | POA: Diagnosis not present

## 2019-10-28 DIAGNOSIS — Z09 Encounter for follow-up examination after completed treatment for conditions other than malignant neoplasm: Secondary | ICD-10-CM | POA: Diagnosis not present

## 2019-10-28 DIAGNOSIS — J441 Chronic obstructive pulmonary disease with (acute) exacerbation: Secondary | ICD-10-CM | POA: Diagnosis not present

## 2019-10-29 DIAGNOSIS — Z9981 Dependence on supplemental oxygen: Secondary | ICD-10-CM | POA: Diagnosis not present

## 2019-10-29 DIAGNOSIS — J9811 Atelectasis: Secondary | ICD-10-CM | POA: Diagnosis not present

## 2019-10-29 DIAGNOSIS — R6889 Other general symptoms and signs: Secondary | ICD-10-CM | POA: Diagnosis not present

## 2019-10-29 DIAGNOSIS — Z9181 History of falling: Secondary | ICD-10-CM | POA: Diagnosis not present

## 2019-10-29 DIAGNOSIS — Z85038 Personal history of other malignant neoplasm of large intestine: Secondary | ICD-10-CM | POA: Diagnosis not present

## 2019-10-29 DIAGNOSIS — J449 Chronic obstructive pulmonary disease, unspecified: Secondary | ICD-10-CM | POA: Diagnosis not present

## 2019-10-29 DIAGNOSIS — Z87891 Personal history of nicotine dependence: Secondary | ICD-10-CM | POA: Diagnosis not present

## 2019-10-29 DIAGNOSIS — R262 Difficulty in walking, not elsewhere classified: Secondary | ICD-10-CM | POA: Diagnosis not present

## 2019-10-29 DIAGNOSIS — Z85118 Personal history of other malignant neoplasm of bronchus and lung: Secondary | ICD-10-CM | POA: Diagnosis not present

## 2019-10-29 DIAGNOSIS — E039 Hypothyroidism, unspecified: Secondary | ICD-10-CM | POA: Diagnosis not present

## 2019-10-29 DIAGNOSIS — Z743 Need for continuous supervision: Secondary | ICD-10-CM | POA: Diagnosis not present

## 2019-10-29 DIAGNOSIS — N189 Chronic kidney disease, unspecified: Secondary | ICD-10-CM | POA: Diagnosis not present

## 2019-10-29 DIAGNOSIS — I517 Cardiomegaly: Secondary | ICD-10-CM | POA: Diagnosis not present

## 2019-10-29 DIAGNOSIS — M6281 Muscle weakness (generalized): Secondary | ICD-10-CM | POA: Diagnosis not present

## 2019-10-29 DIAGNOSIS — J9611 Chronic respiratory failure with hypoxia: Secondary | ICD-10-CM | POA: Diagnosis not present

## 2019-10-29 DIAGNOSIS — E871 Hypo-osmolality and hyponatremia: Secondary | ICD-10-CM | POA: Diagnosis not present

## 2019-10-29 DIAGNOSIS — E559 Vitamin D deficiency, unspecified: Secondary | ICD-10-CM | POA: Diagnosis not present

## 2019-10-29 DIAGNOSIS — I10 Essential (primary) hypertension: Secondary | ICD-10-CM | POA: Diagnosis not present

## 2019-10-29 DIAGNOSIS — Z9049 Acquired absence of other specified parts of digestive tract: Secondary | ICD-10-CM | POA: Diagnosis not present

## 2019-10-29 DIAGNOSIS — M1990 Unspecified osteoarthritis, unspecified site: Secondary | ICD-10-CM | POA: Diagnosis not present

## 2019-10-29 DIAGNOSIS — Z741 Need for assistance with personal care: Secondary | ICD-10-CM | POA: Diagnosis not present

## 2019-10-29 DIAGNOSIS — C3490 Malignant neoplasm of unspecified part of unspecified bronchus or lung: Secondary | ICD-10-CM | POA: Diagnosis not present

## 2019-10-29 DIAGNOSIS — Z7401 Bed confinement status: Secondary | ICD-10-CM | POA: Diagnosis not present

## 2019-10-29 DIAGNOSIS — I4891 Unspecified atrial fibrillation: Secondary | ICD-10-CM | POA: Diagnosis not present

## 2019-10-29 DIAGNOSIS — R5381 Other malaise: Secondary | ICD-10-CM | POA: Diagnosis not present

## 2019-10-29 DIAGNOSIS — K219 Gastro-esophageal reflux disease without esophagitis: Secondary | ICD-10-CM | POA: Diagnosis not present

## 2019-10-29 DIAGNOSIS — R069 Unspecified abnormalities of breathing: Secondary | ICD-10-CM | POA: Diagnosis not present

## 2019-10-29 DIAGNOSIS — R531 Weakness: Secondary | ICD-10-CM | POA: Diagnosis not present

## 2019-10-29 DIAGNOSIS — J9621 Acute and chronic respiratory failure with hypoxia: Secondary | ICD-10-CM | POA: Diagnosis not present

## 2019-10-29 DIAGNOSIS — Z79899 Other long term (current) drug therapy: Secondary | ICD-10-CM | POA: Diagnosis not present

## 2019-10-29 DIAGNOSIS — M5136 Other intervertebral disc degeneration, lumbar region: Secondary | ICD-10-CM | POA: Diagnosis not present

## 2019-10-30 DIAGNOSIS — E871 Hypo-osmolality and hyponatremia: Secondary | ICD-10-CM | POA: Diagnosis not present

## 2019-10-31 DIAGNOSIS — E871 Hypo-osmolality and hyponatremia: Secondary | ICD-10-CM | POA: Diagnosis not present

## 2019-10-31 DIAGNOSIS — M5136 Other intervertebral disc degeneration, lumbar region: Secondary | ICD-10-CM | POA: Diagnosis not present

## 2019-10-31 DIAGNOSIS — Z9049 Acquired absence of other specified parts of digestive tract: Secondary | ICD-10-CM | POA: Diagnosis not present

## 2019-11-01 DIAGNOSIS — Z87891 Personal history of nicotine dependence: Secondary | ICD-10-CM | POA: Diagnosis not present

## 2019-11-01 DIAGNOSIS — J449 Chronic obstructive pulmonary disease, unspecified: Secondary | ICD-10-CM | POA: Diagnosis not present

## 2019-11-01 DIAGNOSIS — E871 Hypo-osmolality and hyponatremia: Secondary | ICD-10-CM | POA: Diagnosis not present

## 2019-11-01 DIAGNOSIS — J9621 Acute and chronic respiratory failure with hypoxia: Secondary | ICD-10-CM | POA: Diagnosis not present

## 2019-11-02 DIAGNOSIS — J9621 Acute and chronic respiratory failure with hypoxia: Secondary | ICD-10-CM | POA: Diagnosis not present

## 2019-11-02 DIAGNOSIS — R531 Weakness: Secondary | ICD-10-CM | POA: Diagnosis not present

## 2019-11-02 DIAGNOSIS — E559 Vitamin D deficiency, unspecified: Secondary | ICD-10-CM | POA: Diagnosis not present

## 2019-11-02 DIAGNOSIS — R7889 Finding of other specified substances, not normally found in blood: Secondary | ICD-10-CM | POA: Diagnosis not present

## 2019-11-02 DIAGNOSIS — Z741 Need for assistance with personal care: Secondary | ICD-10-CM | POA: Diagnosis not present

## 2019-11-02 DIAGNOSIS — I440 Atrioventricular block, first degree: Secondary | ICD-10-CM | POA: Diagnosis not present

## 2019-11-02 DIAGNOSIS — D649 Anemia, unspecified: Secondary | ICD-10-CM | POA: Diagnosis not present

## 2019-11-02 DIAGNOSIS — Z7401 Bed confinement status: Secondary | ICD-10-CM | POA: Diagnosis not present

## 2019-11-02 DIAGNOSIS — R0602 Shortness of breath: Secondary | ICD-10-CM | POA: Diagnosis not present

## 2019-11-02 DIAGNOSIS — Z9221 Personal history of antineoplastic chemotherapy: Secondary | ICD-10-CM | POA: Diagnosis not present

## 2019-11-02 DIAGNOSIS — M1990 Unspecified osteoarthritis, unspecified site: Secondary | ICD-10-CM | POA: Diagnosis not present

## 2019-11-02 DIAGNOSIS — Z923 Personal history of irradiation: Secondary | ICD-10-CM | POA: Diagnosis not present

## 2019-11-02 DIAGNOSIS — E222 Syndrome of inappropriate secretion of antidiuretic hormone: Secondary | ICD-10-CM | POA: Diagnosis not present

## 2019-11-02 DIAGNOSIS — R5381 Other malaise: Secondary | ICD-10-CM | POA: Diagnosis not present

## 2019-11-02 DIAGNOSIS — R0682 Tachypnea, not elsewhere classified: Secondary | ICD-10-CM | POA: Diagnosis not present

## 2019-11-02 DIAGNOSIS — Z9981 Dependence on supplemental oxygen: Secondary | ICD-10-CM | POA: Diagnosis not present

## 2019-11-02 DIAGNOSIS — Z87891 Personal history of nicotine dependence: Secondary | ICD-10-CM | POA: Diagnosis not present

## 2019-11-02 DIAGNOSIS — J9611 Chronic respiratory failure with hypoxia: Secondary | ICD-10-CM | POA: Diagnosis not present

## 2019-11-02 DIAGNOSIS — K219 Gastro-esophageal reflux disease without esophagitis: Secondary | ICD-10-CM | POA: Diagnosis not present

## 2019-11-02 DIAGNOSIS — R6889 Other general symptoms and signs: Secondary | ICD-10-CM | POA: Diagnosis not present

## 2019-11-02 DIAGNOSIS — R600 Localized edema: Secondary | ICD-10-CM | POA: Diagnosis not present

## 2019-11-02 DIAGNOSIS — E871 Hypo-osmolality and hyponatremia: Secondary | ICD-10-CM | POA: Diagnosis not present

## 2019-11-02 DIAGNOSIS — M6281 Muscle weakness (generalized): Secondary | ICD-10-CM | POA: Diagnosis not present

## 2019-11-02 DIAGNOSIS — J984 Other disorders of lung: Secondary | ICD-10-CM | POA: Diagnosis not present

## 2019-11-02 DIAGNOSIS — R262 Difficulty in walking, not elsewhere classified: Secondary | ICD-10-CM | POA: Diagnosis not present

## 2019-11-02 DIAGNOSIS — I10 Essential (primary) hypertension: Secondary | ICD-10-CM | POA: Diagnosis not present

## 2019-11-02 DIAGNOSIS — J449 Chronic obstructive pulmonary disease, unspecified: Secondary | ICD-10-CM | POA: Diagnosis not present

## 2019-11-02 DIAGNOSIS — Z743 Need for continuous supervision: Secondary | ICD-10-CM | POA: Diagnosis not present

## 2019-11-02 DIAGNOSIS — E039 Hypothyroidism, unspecified: Secondary | ICD-10-CM | POA: Diagnosis not present

## 2019-11-02 DIAGNOSIS — C3490 Malignant neoplasm of unspecified part of unspecified bronchus or lung: Secondary | ICD-10-CM | POA: Diagnosis not present

## 2019-11-02 DIAGNOSIS — E878 Other disorders of electrolyte and fluid balance, not elsewhere classified: Secondary | ICD-10-CM | POA: Diagnosis not present

## 2019-11-03 DIAGNOSIS — J9611 Chronic respiratory failure with hypoxia: Secondary | ICD-10-CM | POA: Diagnosis not present

## 2019-11-03 DIAGNOSIS — J449 Chronic obstructive pulmonary disease, unspecified: Secondary | ICD-10-CM | POA: Diagnosis not present

## 2019-11-03 DIAGNOSIS — R531 Weakness: Secondary | ICD-10-CM | POA: Diagnosis not present

## 2019-11-03 DIAGNOSIS — R7889 Finding of other specified substances, not normally found in blood: Secondary | ICD-10-CM | POA: Diagnosis not present

## 2019-11-03 DIAGNOSIS — E039 Hypothyroidism, unspecified: Secondary | ICD-10-CM | POA: Diagnosis not present

## 2019-11-03 DIAGNOSIS — I10 Essential (primary) hypertension: Secondary | ICD-10-CM | POA: Diagnosis not present

## 2019-11-03 DIAGNOSIS — D649 Anemia, unspecified: Secondary | ICD-10-CM | POA: Diagnosis not present

## 2019-11-03 DIAGNOSIS — E871 Hypo-osmolality and hyponatremia: Secondary | ICD-10-CM | POA: Diagnosis not present

## 2019-11-03 DIAGNOSIS — E559 Vitamin D deficiency, unspecified: Secondary | ICD-10-CM | POA: Diagnosis not present

## 2019-11-04 DIAGNOSIS — J189 Pneumonia, unspecified organism: Secondary | ICD-10-CM | POA: Diagnosis not present

## 2019-11-04 DIAGNOSIS — J984 Other disorders of lung: Secondary | ICD-10-CM | POA: Diagnosis not present

## 2019-11-05 DIAGNOSIS — E871 Hypo-osmolality and hyponatremia: Secondary | ICD-10-CM | POA: Diagnosis not present

## 2019-11-05 DIAGNOSIS — J449 Chronic obstructive pulmonary disease, unspecified: Secondary | ICD-10-CM | POA: Diagnosis not present

## 2019-11-05 DIAGNOSIS — E039 Hypothyroidism, unspecified: Secondary | ICD-10-CM | POA: Diagnosis not present

## 2019-11-05 DIAGNOSIS — I10 Essential (primary) hypertension: Secondary | ICD-10-CM | POA: Diagnosis not present

## 2019-11-05 DIAGNOSIS — D649 Anemia, unspecified: Secondary | ICD-10-CM | POA: Diagnosis not present

## 2019-11-05 DIAGNOSIS — R531 Weakness: Secondary | ICD-10-CM | POA: Diagnosis not present

## 2019-11-05 DIAGNOSIS — R7889 Finding of other specified substances, not normally found in blood: Secondary | ICD-10-CM | POA: Diagnosis not present

## 2019-11-06 DIAGNOSIS — E871 Hypo-osmolality and hyponatremia: Secondary | ICD-10-CM | POA: Diagnosis not present

## 2019-11-08 DIAGNOSIS — E871 Hypo-osmolality and hyponatremia: Secondary | ICD-10-CM | POA: Diagnosis not present

## 2019-11-10 DIAGNOSIS — E8770 Fluid overload, unspecified: Secondary | ICD-10-CM | POA: Diagnosis not present

## 2019-11-10 DIAGNOSIS — E559 Vitamin D deficiency, unspecified: Secondary | ICD-10-CM | POA: Diagnosis not present

## 2019-11-10 DIAGNOSIS — R600 Localized edema: Secondary | ICD-10-CM | POA: Diagnosis not present

## 2019-11-10 DIAGNOSIS — I10 Essential (primary) hypertension: Secondary | ICD-10-CM | POA: Diagnosis not present

## 2019-11-10 DIAGNOSIS — J449 Chronic obstructive pulmonary disease, unspecified: Secondary | ICD-10-CM | POA: Diagnosis not present

## 2019-11-10 DIAGNOSIS — R0602 Shortness of breath: Secondary | ICD-10-CM | POA: Diagnosis not present

## 2019-11-10 DIAGNOSIS — Z923 Personal history of irradiation: Secondary | ICD-10-CM | POA: Diagnosis not present

## 2019-11-10 DIAGNOSIS — R0682 Tachypnea, not elsewhere classified: Secondary | ICD-10-CM | POA: Diagnosis not present

## 2019-11-10 DIAGNOSIS — R7889 Finding of other specified substances, not normally found in blood: Secondary | ICD-10-CM | POA: Diagnosis not present

## 2019-11-10 DIAGNOSIS — Z743 Need for continuous supervision: Secondary | ICD-10-CM | POA: Diagnosis not present

## 2019-11-10 DIAGNOSIS — Z87891 Personal history of nicotine dependence: Secondary | ICD-10-CM | POA: Diagnosis not present

## 2019-11-10 DIAGNOSIS — E222 Syndrome of inappropriate secretion of antidiuretic hormone: Secondary | ICD-10-CM | POA: Diagnosis not present

## 2019-11-10 DIAGNOSIS — E039 Hypothyroidism, unspecified: Secondary | ICD-10-CM | POA: Diagnosis not present

## 2019-11-10 DIAGNOSIS — K219 Gastro-esophageal reflux disease without esophagitis: Secondary | ICD-10-CM | POA: Diagnosis not present

## 2019-11-10 DIAGNOSIS — Z9981 Dependence on supplemental oxygen: Secondary | ICD-10-CM | POA: Diagnosis not present

## 2019-11-10 DIAGNOSIS — E871 Hypo-osmolality and hyponatremia: Secondary | ICD-10-CM | POA: Diagnosis not present

## 2019-11-10 DIAGNOSIS — R911 Solitary pulmonary nodule: Secondary | ICD-10-CM | POA: Diagnosis not present

## 2019-11-10 DIAGNOSIS — J439 Emphysema, unspecified: Secondary | ICD-10-CM | POA: Diagnosis not present

## 2019-11-10 DIAGNOSIS — Z9221 Personal history of antineoplastic chemotherapy: Secondary | ICD-10-CM | POA: Diagnosis not present

## 2019-11-10 DIAGNOSIS — D649 Anemia, unspecified: Secondary | ICD-10-CM | POA: Diagnosis not present

## 2019-11-10 DIAGNOSIS — R2243 Localized swelling, mass and lump, lower limb, bilateral: Secondary | ICD-10-CM | POA: Diagnosis not present

## 2019-11-10 DIAGNOSIS — R6889 Other general symptoms and signs: Secondary | ICD-10-CM | POA: Diagnosis not present

## 2019-11-10 DIAGNOSIS — I440 Atrioventricular block, first degree: Secondary | ICD-10-CM | POA: Diagnosis not present

## 2019-11-10 DIAGNOSIS — R9431 Abnormal electrocardiogram [ECG] [EKG]: Secondary | ICD-10-CM | POA: Diagnosis not present

## 2019-11-10 DIAGNOSIS — J9611 Chronic respiratory failure with hypoxia: Secondary | ICD-10-CM | POA: Diagnosis not present

## 2019-11-10 DIAGNOSIS — E878 Other disorders of electrolyte and fluid balance, not elsewhere classified: Secondary | ICD-10-CM | POA: Diagnosis not present

## 2019-11-18 DIAGNOSIS — J449 Chronic obstructive pulmonary disease, unspecified: Secondary | ICD-10-CM | POA: Diagnosis not present

## 2019-11-18 DIAGNOSIS — J9611 Chronic respiratory failure with hypoxia: Secondary | ICD-10-CM | POA: Diagnosis not present

## 2019-11-18 DIAGNOSIS — J301 Allergic rhinitis due to pollen: Secondary | ICD-10-CM | POA: Diagnosis not present

## 2019-11-19 DIAGNOSIS — R600 Localized edema: Secondary | ICD-10-CM | POA: Diagnosis not present

## 2019-11-19 DIAGNOSIS — E222 Syndrome of inappropriate secretion of antidiuretic hormone: Secondary | ICD-10-CM | POA: Diagnosis not present

## 2019-11-19 DIAGNOSIS — I10 Essential (primary) hypertension: Secondary | ICD-10-CM | POA: Diagnosis not present

## 2019-11-19 DIAGNOSIS — M5116 Intervertebral disc disorders with radiculopathy, lumbar region: Secondary | ICD-10-CM | POA: Diagnosis not present

## 2019-11-19 DIAGNOSIS — Z87891 Personal history of nicotine dependence: Secondary | ICD-10-CM | POA: Diagnosis not present

## 2019-11-19 DIAGNOSIS — E559 Vitamin D deficiency, unspecified: Secondary | ICD-10-CM | POA: Diagnosis not present

## 2019-11-19 DIAGNOSIS — K219 Gastro-esophageal reflux disease without esophagitis: Secondary | ICD-10-CM | POA: Diagnosis not present

## 2019-11-19 DIAGNOSIS — Z9981 Dependence on supplemental oxygen: Secondary | ICD-10-CM | POA: Diagnosis not present

## 2019-11-19 DIAGNOSIS — J9611 Chronic respiratory failure with hypoxia: Secondary | ICD-10-CM | POA: Diagnosis not present

## 2019-11-19 DIAGNOSIS — R1314 Dysphagia, pharyngoesophageal phase: Secondary | ICD-10-CM | POA: Diagnosis not present

## 2019-11-19 DIAGNOSIS — Z9221 Personal history of antineoplastic chemotherapy: Secondary | ICD-10-CM | POA: Diagnosis not present

## 2019-11-19 DIAGNOSIS — M1990 Unspecified osteoarthritis, unspecified site: Secondary | ICD-10-CM | POA: Diagnosis not present

## 2019-11-19 DIAGNOSIS — J449 Chronic obstructive pulmonary disease, unspecified: Secondary | ICD-10-CM | POA: Diagnosis not present

## 2019-11-19 DIAGNOSIS — E039 Hypothyroidism, unspecified: Secondary | ICD-10-CM | POA: Diagnosis not present

## 2019-11-19 DIAGNOSIS — G2581 Restless legs syndrome: Secondary | ICD-10-CM | POA: Diagnosis not present

## 2019-11-19 DIAGNOSIS — Z85118 Personal history of other malignant neoplasm of bronchus and lung: Secondary | ICD-10-CM | POA: Diagnosis not present

## 2019-11-19 DIAGNOSIS — Z923 Personal history of irradiation: Secondary | ICD-10-CM | POA: Diagnosis not present

## 2019-11-22 DIAGNOSIS — Z9221 Personal history of antineoplastic chemotherapy: Secondary | ICD-10-CM | POA: Diagnosis not present

## 2019-11-22 DIAGNOSIS — G2581 Restless legs syndrome: Secondary | ICD-10-CM | POA: Diagnosis not present

## 2019-11-22 DIAGNOSIS — M1990 Unspecified osteoarthritis, unspecified site: Secondary | ICD-10-CM | POA: Diagnosis not present

## 2019-11-22 DIAGNOSIS — R600 Localized edema: Secondary | ICD-10-CM | POA: Diagnosis not present

## 2019-11-22 DIAGNOSIS — E039 Hypothyroidism, unspecified: Secondary | ICD-10-CM | POA: Diagnosis not present

## 2019-11-22 DIAGNOSIS — E222 Syndrome of inappropriate secretion of antidiuretic hormone: Secondary | ICD-10-CM | POA: Diagnosis not present

## 2019-11-22 DIAGNOSIS — Z923 Personal history of irradiation: Secondary | ICD-10-CM | POA: Diagnosis not present

## 2019-11-22 DIAGNOSIS — Z85118 Personal history of other malignant neoplasm of bronchus and lung: Secondary | ICD-10-CM | POA: Diagnosis not present

## 2019-11-22 DIAGNOSIS — M5116 Intervertebral disc disorders with radiculopathy, lumbar region: Secondary | ICD-10-CM | POA: Diagnosis not present

## 2019-11-22 DIAGNOSIS — R1314 Dysphagia, pharyngoesophageal phase: Secondary | ICD-10-CM | POA: Diagnosis not present

## 2019-11-22 DIAGNOSIS — Z87891 Personal history of nicotine dependence: Secondary | ICD-10-CM | POA: Diagnosis not present

## 2019-11-22 DIAGNOSIS — E559 Vitamin D deficiency, unspecified: Secondary | ICD-10-CM | POA: Diagnosis not present

## 2019-11-22 DIAGNOSIS — J9611 Chronic respiratory failure with hypoxia: Secondary | ICD-10-CM | POA: Diagnosis not present

## 2019-11-22 DIAGNOSIS — K219 Gastro-esophageal reflux disease without esophagitis: Secondary | ICD-10-CM | POA: Diagnosis not present

## 2019-11-22 DIAGNOSIS — Z9981 Dependence on supplemental oxygen: Secondary | ICD-10-CM | POA: Diagnosis not present

## 2019-11-22 DIAGNOSIS — J449 Chronic obstructive pulmonary disease, unspecified: Secondary | ICD-10-CM | POA: Diagnosis not present

## 2019-11-22 DIAGNOSIS — I10 Essential (primary) hypertension: Secondary | ICD-10-CM | POA: Diagnosis not present

## 2019-11-23 DIAGNOSIS — J449 Chronic obstructive pulmonary disease, unspecified: Secondary | ICD-10-CM | POA: Diagnosis not present

## 2019-11-23 DIAGNOSIS — E039 Hypothyroidism, unspecified: Secondary | ICD-10-CM | POA: Diagnosis not present

## 2019-11-23 DIAGNOSIS — E559 Vitamin D deficiency, unspecified: Secondary | ICD-10-CM | POA: Diagnosis not present

## 2019-11-23 DIAGNOSIS — Z9981 Dependence on supplemental oxygen: Secondary | ICD-10-CM | POA: Diagnosis not present

## 2019-11-23 DIAGNOSIS — M1990 Unspecified osteoarthritis, unspecified site: Secondary | ICD-10-CM | POA: Diagnosis not present

## 2019-11-23 DIAGNOSIS — E222 Syndrome of inappropriate secretion of antidiuretic hormone: Secondary | ICD-10-CM | POA: Diagnosis not present

## 2019-11-23 DIAGNOSIS — K219 Gastro-esophageal reflux disease without esophagitis: Secondary | ICD-10-CM | POA: Diagnosis not present

## 2019-11-23 DIAGNOSIS — J9611 Chronic respiratory failure with hypoxia: Secondary | ICD-10-CM | POA: Diagnosis not present

## 2019-11-23 DIAGNOSIS — Z9221 Personal history of antineoplastic chemotherapy: Secondary | ICD-10-CM | POA: Diagnosis not present

## 2019-11-23 DIAGNOSIS — G2581 Restless legs syndrome: Secondary | ICD-10-CM | POA: Diagnosis not present

## 2019-11-23 DIAGNOSIS — R600 Localized edema: Secondary | ICD-10-CM | POA: Diagnosis not present

## 2019-11-23 DIAGNOSIS — R1314 Dysphagia, pharyngoesophageal phase: Secondary | ICD-10-CM | POA: Diagnosis not present

## 2019-11-23 DIAGNOSIS — Z85118 Personal history of other malignant neoplasm of bronchus and lung: Secondary | ICD-10-CM | POA: Diagnosis not present

## 2019-11-23 DIAGNOSIS — I10 Essential (primary) hypertension: Secondary | ICD-10-CM | POA: Diagnosis not present

## 2019-11-23 DIAGNOSIS — M5116 Intervertebral disc disorders with radiculopathy, lumbar region: Secondary | ICD-10-CM | POA: Diagnosis not present

## 2019-11-23 DIAGNOSIS — Z87891 Personal history of nicotine dependence: Secondary | ICD-10-CM | POA: Diagnosis not present

## 2019-11-23 DIAGNOSIS — Z923 Personal history of irradiation: Secondary | ICD-10-CM | POA: Diagnosis not present

## 2019-11-26 DIAGNOSIS — Z87891 Personal history of nicotine dependence: Secondary | ICD-10-CM | POA: Diagnosis not present

## 2019-11-26 DIAGNOSIS — G2581 Restless legs syndrome: Secondary | ICD-10-CM | POA: Diagnosis not present

## 2019-11-26 DIAGNOSIS — Z923 Personal history of irradiation: Secondary | ICD-10-CM | POA: Diagnosis not present

## 2019-11-26 DIAGNOSIS — J449 Chronic obstructive pulmonary disease, unspecified: Secondary | ICD-10-CM | POA: Diagnosis not present

## 2019-11-26 DIAGNOSIS — Z9981 Dependence on supplemental oxygen: Secondary | ICD-10-CM | POA: Diagnosis not present

## 2019-11-26 DIAGNOSIS — R600 Localized edema: Secondary | ICD-10-CM | POA: Diagnosis not present

## 2019-11-26 DIAGNOSIS — E039 Hypothyroidism, unspecified: Secondary | ICD-10-CM | POA: Diagnosis not present

## 2019-11-26 DIAGNOSIS — E222 Syndrome of inappropriate secretion of antidiuretic hormone: Secondary | ICD-10-CM | POA: Diagnosis not present

## 2019-11-26 DIAGNOSIS — J9611 Chronic respiratory failure with hypoxia: Secondary | ICD-10-CM | POA: Diagnosis not present

## 2019-11-26 DIAGNOSIS — E559 Vitamin D deficiency, unspecified: Secondary | ICD-10-CM | POA: Diagnosis not present

## 2019-11-26 DIAGNOSIS — R1314 Dysphagia, pharyngoesophageal phase: Secondary | ICD-10-CM | POA: Diagnosis not present

## 2019-11-26 DIAGNOSIS — K219 Gastro-esophageal reflux disease without esophagitis: Secondary | ICD-10-CM | POA: Diagnosis not present

## 2019-11-26 DIAGNOSIS — Z85118 Personal history of other malignant neoplasm of bronchus and lung: Secondary | ICD-10-CM | POA: Diagnosis not present

## 2019-11-26 DIAGNOSIS — Z9221 Personal history of antineoplastic chemotherapy: Secondary | ICD-10-CM | POA: Diagnosis not present

## 2019-11-26 DIAGNOSIS — M5116 Intervertebral disc disorders with radiculopathy, lumbar region: Secondary | ICD-10-CM | POA: Diagnosis not present

## 2019-11-26 DIAGNOSIS — I10 Essential (primary) hypertension: Secondary | ICD-10-CM | POA: Diagnosis not present

## 2019-11-26 DIAGNOSIS — M1990 Unspecified osteoarthritis, unspecified site: Secondary | ICD-10-CM | POA: Diagnosis not present

## 2019-11-30 DIAGNOSIS — M5116 Intervertebral disc disorders with radiculopathy, lumbar region: Secondary | ICD-10-CM | POA: Diagnosis not present

## 2019-11-30 DIAGNOSIS — Z87891 Personal history of nicotine dependence: Secondary | ICD-10-CM | POA: Diagnosis not present

## 2019-11-30 DIAGNOSIS — Z9221 Personal history of antineoplastic chemotherapy: Secondary | ICD-10-CM | POA: Diagnosis not present

## 2019-11-30 DIAGNOSIS — M1990 Unspecified osteoarthritis, unspecified site: Secondary | ICD-10-CM | POA: Diagnosis not present

## 2019-11-30 DIAGNOSIS — E039 Hypothyroidism, unspecified: Secondary | ICD-10-CM | POA: Diagnosis not present

## 2019-11-30 DIAGNOSIS — E222 Syndrome of inappropriate secretion of antidiuretic hormone: Secondary | ICD-10-CM | POA: Diagnosis not present

## 2019-11-30 DIAGNOSIS — Z85118 Personal history of other malignant neoplasm of bronchus and lung: Secondary | ICD-10-CM | POA: Diagnosis not present

## 2019-11-30 DIAGNOSIS — R600 Localized edema: Secondary | ICD-10-CM | POA: Diagnosis not present

## 2019-11-30 DIAGNOSIS — J449 Chronic obstructive pulmonary disease, unspecified: Secondary | ICD-10-CM | POA: Diagnosis not present

## 2019-11-30 DIAGNOSIS — K219 Gastro-esophageal reflux disease without esophagitis: Secondary | ICD-10-CM | POA: Diagnosis not present

## 2019-11-30 DIAGNOSIS — R1314 Dysphagia, pharyngoesophageal phase: Secondary | ICD-10-CM | POA: Diagnosis not present

## 2019-11-30 DIAGNOSIS — E559 Vitamin D deficiency, unspecified: Secondary | ICD-10-CM | POA: Diagnosis not present

## 2019-11-30 DIAGNOSIS — Z9981 Dependence on supplemental oxygen: Secondary | ICD-10-CM | POA: Diagnosis not present

## 2019-11-30 DIAGNOSIS — Z923 Personal history of irradiation: Secondary | ICD-10-CM | POA: Diagnosis not present

## 2019-11-30 DIAGNOSIS — G2581 Restless legs syndrome: Secondary | ICD-10-CM | POA: Diagnosis not present

## 2019-11-30 DIAGNOSIS — I10 Essential (primary) hypertension: Secondary | ICD-10-CM | POA: Diagnosis not present

## 2019-11-30 DIAGNOSIS — J9611 Chronic respiratory failure with hypoxia: Secondary | ICD-10-CM | POA: Diagnosis not present

## 2019-12-02 DIAGNOSIS — I10 Essential (primary) hypertension: Secondary | ICD-10-CM | POA: Diagnosis not present

## 2019-12-02 DIAGNOSIS — E559 Vitamin D deficiency, unspecified: Secondary | ICD-10-CM | POA: Diagnosis not present

## 2019-12-02 DIAGNOSIS — Z9221 Personal history of antineoplastic chemotherapy: Secondary | ICD-10-CM | POA: Diagnosis not present

## 2019-12-02 DIAGNOSIS — J9611 Chronic respiratory failure with hypoxia: Secondary | ICD-10-CM | POA: Diagnosis not present

## 2019-12-02 DIAGNOSIS — Z87891 Personal history of nicotine dependence: Secondary | ICD-10-CM | POA: Diagnosis not present

## 2019-12-02 DIAGNOSIS — Z85118 Personal history of other malignant neoplasm of bronchus and lung: Secondary | ICD-10-CM | POA: Diagnosis not present

## 2019-12-02 DIAGNOSIS — M1990 Unspecified osteoarthritis, unspecified site: Secondary | ICD-10-CM | POA: Diagnosis not present

## 2019-12-02 DIAGNOSIS — G2581 Restless legs syndrome: Secondary | ICD-10-CM | POA: Diagnosis not present

## 2019-12-02 DIAGNOSIS — Z9981 Dependence on supplemental oxygen: Secondary | ICD-10-CM | POA: Diagnosis not present

## 2019-12-02 DIAGNOSIS — R1314 Dysphagia, pharyngoesophageal phase: Secondary | ICD-10-CM | POA: Diagnosis not present

## 2019-12-02 DIAGNOSIS — K219 Gastro-esophageal reflux disease without esophagitis: Secondary | ICD-10-CM | POA: Diagnosis not present

## 2019-12-02 DIAGNOSIS — Z923 Personal history of irradiation: Secondary | ICD-10-CM | POA: Diagnosis not present

## 2019-12-02 DIAGNOSIS — J449 Chronic obstructive pulmonary disease, unspecified: Secondary | ICD-10-CM | POA: Diagnosis not present

## 2019-12-02 DIAGNOSIS — R6889 Other general symptoms and signs: Secondary | ICD-10-CM | POA: Diagnosis not present

## 2019-12-02 DIAGNOSIS — M5116 Intervertebral disc disorders with radiculopathy, lumbar region: Secondary | ICD-10-CM | POA: Diagnosis not present

## 2019-12-02 DIAGNOSIS — R600 Localized edema: Secondary | ICD-10-CM | POA: Diagnosis not present

## 2019-12-02 DIAGNOSIS — J301 Allergic rhinitis due to pollen: Secondary | ICD-10-CM | POA: Diagnosis not present

## 2019-12-02 DIAGNOSIS — E222 Syndrome of inappropriate secretion of antidiuretic hormone: Secondary | ICD-10-CM | POA: Diagnosis not present

## 2019-12-02 DIAGNOSIS — E039 Hypothyroidism, unspecified: Secondary | ICD-10-CM | POA: Diagnosis not present

## 2019-12-06 DIAGNOSIS — Z9981 Dependence on supplemental oxygen: Secondary | ICD-10-CM | POA: Diagnosis not present

## 2019-12-06 DIAGNOSIS — J9611 Chronic respiratory failure with hypoxia: Secondary | ICD-10-CM | POA: Diagnosis not present

## 2019-12-06 DIAGNOSIS — M5116 Intervertebral disc disorders with radiculopathy, lumbar region: Secondary | ICD-10-CM | POA: Diagnosis not present

## 2019-12-06 DIAGNOSIS — J449 Chronic obstructive pulmonary disease, unspecified: Secondary | ICD-10-CM | POA: Diagnosis not present

## 2019-12-06 DIAGNOSIS — E039 Hypothyroidism, unspecified: Secondary | ICD-10-CM | POA: Diagnosis not present

## 2019-12-06 DIAGNOSIS — I10 Essential (primary) hypertension: Secondary | ICD-10-CM | POA: Diagnosis not present

## 2019-12-06 DIAGNOSIS — Z87891 Personal history of nicotine dependence: Secondary | ICD-10-CM | POA: Diagnosis not present

## 2019-12-06 DIAGNOSIS — R600 Localized edema: Secondary | ICD-10-CM | POA: Diagnosis not present

## 2019-12-06 DIAGNOSIS — K219 Gastro-esophageal reflux disease without esophagitis: Secondary | ICD-10-CM | POA: Diagnosis not present

## 2019-12-06 DIAGNOSIS — G2581 Restless legs syndrome: Secondary | ICD-10-CM | POA: Diagnosis not present

## 2019-12-06 DIAGNOSIS — Z85118 Personal history of other malignant neoplasm of bronchus and lung: Secondary | ICD-10-CM | POA: Diagnosis not present

## 2019-12-06 DIAGNOSIS — Z9221 Personal history of antineoplastic chemotherapy: Secondary | ICD-10-CM | POA: Diagnosis not present

## 2019-12-06 DIAGNOSIS — M1990 Unspecified osteoarthritis, unspecified site: Secondary | ICD-10-CM | POA: Diagnosis not present

## 2019-12-06 DIAGNOSIS — Z923 Personal history of irradiation: Secondary | ICD-10-CM | POA: Diagnosis not present

## 2019-12-06 DIAGNOSIS — R1314 Dysphagia, pharyngoesophageal phase: Secondary | ICD-10-CM | POA: Diagnosis not present

## 2019-12-06 DIAGNOSIS — E222 Syndrome of inappropriate secretion of antidiuretic hormone: Secondary | ICD-10-CM | POA: Diagnosis not present

## 2019-12-06 DIAGNOSIS — E559 Vitamin D deficiency, unspecified: Secondary | ICD-10-CM | POA: Diagnosis not present

## 2019-12-08 DIAGNOSIS — E559 Vitamin D deficiency, unspecified: Secondary | ICD-10-CM | POA: Diagnosis not present

## 2019-12-08 DIAGNOSIS — Z9221 Personal history of antineoplastic chemotherapy: Secondary | ICD-10-CM | POA: Diagnosis not present

## 2019-12-08 DIAGNOSIS — G2581 Restless legs syndrome: Secondary | ICD-10-CM | POA: Diagnosis not present

## 2019-12-08 DIAGNOSIS — E222 Syndrome of inappropriate secretion of antidiuretic hormone: Secondary | ICD-10-CM | POA: Diagnosis not present

## 2019-12-08 DIAGNOSIS — Z9981 Dependence on supplemental oxygen: Secondary | ICD-10-CM | POA: Diagnosis not present

## 2019-12-08 DIAGNOSIS — I10 Essential (primary) hypertension: Secondary | ICD-10-CM | POA: Diagnosis not present

## 2019-12-08 DIAGNOSIS — Z85118 Personal history of other malignant neoplasm of bronchus and lung: Secondary | ICD-10-CM | POA: Diagnosis not present

## 2019-12-08 DIAGNOSIS — E039 Hypothyroidism, unspecified: Secondary | ICD-10-CM | POA: Diagnosis not present

## 2019-12-08 DIAGNOSIS — Z87891 Personal history of nicotine dependence: Secondary | ICD-10-CM | POA: Diagnosis not present

## 2019-12-08 DIAGNOSIS — J9611 Chronic respiratory failure with hypoxia: Secondary | ICD-10-CM | POA: Diagnosis not present

## 2019-12-08 DIAGNOSIS — M5116 Intervertebral disc disorders with radiculopathy, lumbar region: Secondary | ICD-10-CM | POA: Diagnosis not present

## 2019-12-08 DIAGNOSIS — R600 Localized edema: Secondary | ICD-10-CM | POA: Diagnosis not present

## 2019-12-08 DIAGNOSIS — R1314 Dysphagia, pharyngoesophageal phase: Secondary | ICD-10-CM | POA: Diagnosis not present

## 2019-12-08 DIAGNOSIS — M1990 Unspecified osteoarthritis, unspecified site: Secondary | ICD-10-CM | POA: Diagnosis not present

## 2019-12-08 DIAGNOSIS — Z923 Personal history of irradiation: Secondary | ICD-10-CM | POA: Diagnosis not present

## 2019-12-08 DIAGNOSIS — K219 Gastro-esophageal reflux disease without esophagitis: Secondary | ICD-10-CM | POA: Diagnosis not present

## 2019-12-08 DIAGNOSIS — J449 Chronic obstructive pulmonary disease, unspecified: Secondary | ICD-10-CM | POA: Diagnosis not present

## 2019-12-09 DIAGNOSIS — G2581 Restless legs syndrome: Secondary | ICD-10-CM | POA: Diagnosis not present

## 2019-12-09 DIAGNOSIS — J9611 Chronic respiratory failure with hypoxia: Secondary | ICD-10-CM | POA: Diagnosis not present

## 2019-12-09 DIAGNOSIS — Z9221 Personal history of antineoplastic chemotherapy: Secondary | ICD-10-CM | POA: Diagnosis not present

## 2019-12-09 DIAGNOSIS — J449 Chronic obstructive pulmonary disease, unspecified: Secondary | ICD-10-CM | POA: Diagnosis not present

## 2019-12-09 DIAGNOSIS — Z923 Personal history of irradiation: Secondary | ICD-10-CM | POA: Diagnosis not present

## 2019-12-09 DIAGNOSIS — E222 Syndrome of inappropriate secretion of antidiuretic hormone: Secondary | ICD-10-CM | POA: Diagnosis not present

## 2019-12-09 DIAGNOSIS — E559 Vitamin D deficiency, unspecified: Secondary | ICD-10-CM | POA: Diagnosis not present

## 2019-12-09 DIAGNOSIS — Z9981 Dependence on supplemental oxygen: Secondary | ICD-10-CM | POA: Diagnosis not present

## 2019-12-09 DIAGNOSIS — R1314 Dysphagia, pharyngoesophageal phase: Secondary | ICD-10-CM | POA: Diagnosis not present

## 2019-12-09 DIAGNOSIS — K219 Gastro-esophageal reflux disease without esophagitis: Secondary | ICD-10-CM | POA: Diagnosis not present

## 2019-12-09 DIAGNOSIS — Z87891 Personal history of nicotine dependence: Secondary | ICD-10-CM | POA: Diagnosis not present

## 2019-12-09 DIAGNOSIS — M5116 Intervertebral disc disorders with radiculopathy, lumbar region: Secondary | ICD-10-CM | POA: Diagnosis not present

## 2019-12-09 DIAGNOSIS — R600 Localized edema: Secondary | ICD-10-CM | POA: Diagnosis not present

## 2019-12-09 DIAGNOSIS — M1990 Unspecified osteoarthritis, unspecified site: Secondary | ICD-10-CM | POA: Diagnosis not present

## 2019-12-09 DIAGNOSIS — Z85118 Personal history of other malignant neoplasm of bronchus and lung: Secondary | ICD-10-CM | POA: Diagnosis not present

## 2019-12-09 DIAGNOSIS — I10 Essential (primary) hypertension: Secondary | ICD-10-CM | POA: Diagnosis not present

## 2019-12-09 DIAGNOSIS — E039 Hypothyroidism, unspecified: Secondary | ICD-10-CM | POA: Diagnosis not present

## 2019-12-13 DIAGNOSIS — Z85118 Personal history of other malignant neoplasm of bronchus and lung: Secondary | ICD-10-CM | POA: Diagnosis not present

## 2019-12-13 DIAGNOSIS — J9611 Chronic respiratory failure with hypoxia: Secondary | ICD-10-CM | POA: Diagnosis not present

## 2019-12-13 DIAGNOSIS — R600 Localized edema: Secondary | ICD-10-CM | POA: Diagnosis not present

## 2019-12-13 DIAGNOSIS — E222 Syndrome of inappropriate secretion of antidiuretic hormone: Secondary | ICD-10-CM | POA: Diagnosis not present

## 2019-12-13 DIAGNOSIS — M5116 Intervertebral disc disorders with radiculopathy, lumbar region: Secondary | ICD-10-CM | POA: Diagnosis not present

## 2019-12-13 DIAGNOSIS — M1990 Unspecified osteoarthritis, unspecified site: Secondary | ICD-10-CM | POA: Diagnosis not present

## 2019-12-13 DIAGNOSIS — E039 Hypothyroidism, unspecified: Secondary | ICD-10-CM | POA: Diagnosis not present

## 2019-12-13 DIAGNOSIS — J449 Chronic obstructive pulmonary disease, unspecified: Secondary | ICD-10-CM | POA: Diagnosis not present

## 2019-12-13 DIAGNOSIS — Z923 Personal history of irradiation: Secondary | ICD-10-CM | POA: Diagnosis not present

## 2019-12-13 DIAGNOSIS — G2581 Restless legs syndrome: Secondary | ICD-10-CM | POA: Diagnosis not present

## 2019-12-13 DIAGNOSIS — Z9221 Personal history of antineoplastic chemotherapy: Secondary | ICD-10-CM | POA: Diagnosis not present

## 2019-12-13 DIAGNOSIS — E559 Vitamin D deficiency, unspecified: Secondary | ICD-10-CM | POA: Diagnosis not present

## 2019-12-13 DIAGNOSIS — Z9981 Dependence on supplemental oxygen: Secondary | ICD-10-CM | POA: Diagnosis not present

## 2019-12-13 DIAGNOSIS — I10 Essential (primary) hypertension: Secondary | ICD-10-CM | POA: Diagnosis not present

## 2019-12-13 DIAGNOSIS — Z87891 Personal history of nicotine dependence: Secondary | ICD-10-CM | POA: Diagnosis not present

## 2019-12-13 DIAGNOSIS — K219 Gastro-esophageal reflux disease without esophagitis: Secondary | ICD-10-CM | POA: Diagnosis not present

## 2019-12-13 DIAGNOSIS — R1314 Dysphagia, pharyngoesophageal phase: Secondary | ICD-10-CM | POA: Diagnosis not present

## 2019-12-15 DIAGNOSIS — I10 Essential (primary) hypertension: Secondary | ICD-10-CM | POA: Diagnosis not present

## 2019-12-15 DIAGNOSIS — R1314 Dysphagia, pharyngoesophageal phase: Secondary | ICD-10-CM | POA: Diagnosis not present

## 2019-12-15 DIAGNOSIS — M1990 Unspecified osteoarthritis, unspecified site: Secondary | ICD-10-CM | POA: Diagnosis not present

## 2019-12-15 DIAGNOSIS — E222 Syndrome of inappropriate secretion of antidiuretic hormone: Secondary | ICD-10-CM | POA: Diagnosis not present

## 2019-12-15 DIAGNOSIS — Z85118 Personal history of other malignant neoplasm of bronchus and lung: Secondary | ICD-10-CM | POA: Diagnosis not present

## 2019-12-15 DIAGNOSIS — J449 Chronic obstructive pulmonary disease, unspecified: Secondary | ICD-10-CM | POA: Diagnosis not present

## 2019-12-15 DIAGNOSIS — E039 Hypothyroidism, unspecified: Secondary | ICD-10-CM | POA: Diagnosis not present

## 2019-12-15 DIAGNOSIS — J9611 Chronic respiratory failure with hypoxia: Secondary | ICD-10-CM | POA: Diagnosis not present

## 2019-12-15 DIAGNOSIS — Z9981 Dependence on supplemental oxygen: Secondary | ICD-10-CM | POA: Diagnosis not present

## 2019-12-15 DIAGNOSIS — Z9221 Personal history of antineoplastic chemotherapy: Secondary | ICD-10-CM | POA: Diagnosis not present

## 2019-12-15 DIAGNOSIS — G2581 Restless legs syndrome: Secondary | ICD-10-CM | POA: Diagnosis not present

## 2019-12-15 DIAGNOSIS — M5116 Intervertebral disc disorders with radiculopathy, lumbar region: Secondary | ICD-10-CM | POA: Diagnosis not present

## 2019-12-15 DIAGNOSIS — E559 Vitamin D deficiency, unspecified: Secondary | ICD-10-CM | POA: Diagnosis not present

## 2019-12-15 DIAGNOSIS — Z87891 Personal history of nicotine dependence: Secondary | ICD-10-CM | POA: Diagnosis not present

## 2019-12-15 DIAGNOSIS — Z923 Personal history of irradiation: Secondary | ICD-10-CM | POA: Diagnosis not present

## 2019-12-15 DIAGNOSIS — K219 Gastro-esophageal reflux disease without esophagitis: Secondary | ICD-10-CM | POA: Diagnosis not present

## 2019-12-15 DIAGNOSIS — R600 Localized edema: Secondary | ICD-10-CM | POA: Diagnosis not present

## 2019-12-21 DIAGNOSIS — Z09 Encounter for follow-up examination after completed treatment for conditions other than malignant neoplasm: Secondary | ICD-10-CM | POA: Diagnosis not present

## 2019-12-21 DIAGNOSIS — E039 Hypothyroidism, unspecified: Secondary | ICD-10-CM | POA: Diagnosis not present

## 2019-12-21 DIAGNOSIS — I10 Essential (primary) hypertension: Secondary | ICD-10-CM | POA: Diagnosis not present

## 2019-12-21 DIAGNOSIS — E871 Hypo-osmolality and hyponatremia: Secondary | ICD-10-CM | POA: Diagnosis not present

## 2019-12-21 DIAGNOSIS — E222 Syndrome of inappropriate secretion of antidiuretic hormone: Secondary | ICD-10-CM | POA: Diagnosis not present

## 2019-12-21 DIAGNOSIS — Z23 Encounter for immunization: Secondary | ICD-10-CM | POA: Diagnosis not present

## 2019-12-22 DIAGNOSIS — Z9221 Personal history of antineoplastic chemotherapy: Secondary | ICD-10-CM | POA: Diagnosis not present

## 2019-12-22 DIAGNOSIS — I10 Essential (primary) hypertension: Secondary | ICD-10-CM | POA: Diagnosis not present

## 2019-12-22 DIAGNOSIS — J449 Chronic obstructive pulmonary disease, unspecified: Secondary | ICD-10-CM | POA: Diagnosis not present

## 2019-12-22 DIAGNOSIS — K219 Gastro-esophageal reflux disease without esophagitis: Secondary | ICD-10-CM | POA: Diagnosis not present

## 2019-12-22 DIAGNOSIS — G2581 Restless legs syndrome: Secondary | ICD-10-CM | POA: Diagnosis not present

## 2019-12-22 DIAGNOSIS — Z85118 Personal history of other malignant neoplasm of bronchus and lung: Secondary | ICD-10-CM | POA: Diagnosis not present

## 2019-12-22 DIAGNOSIS — Z9981 Dependence on supplemental oxygen: Secondary | ICD-10-CM | POA: Diagnosis not present

## 2019-12-22 DIAGNOSIS — E559 Vitamin D deficiency, unspecified: Secondary | ICD-10-CM | POA: Diagnosis not present

## 2019-12-22 DIAGNOSIS — E039 Hypothyroidism, unspecified: Secondary | ICD-10-CM | POA: Diagnosis not present

## 2019-12-22 DIAGNOSIS — Z923 Personal history of irradiation: Secondary | ICD-10-CM | POA: Diagnosis not present

## 2019-12-22 DIAGNOSIS — J9611 Chronic respiratory failure with hypoxia: Secondary | ICD-10-CM | POA: Diagnosis not present

## 2019-12-22 DIAGNOSIS — E222 Syndrome of inappropriate secretion of antidiuretic hormone: Secondary | ICD-10-CM | POA: Diagnosis not present

## 2019-12-22 DIAGNOSIS — M1990 Unspecified osteoarthritis, unspecified site: Secondary | ICD-10-CM | POA: Diagnosis not present

## 2019-12-22 DIAGNOSIS — R1314 Dysphagia, pharyngoesophageal phase: Secondary | ICD-10-CM | POA: Diagnosis not present

## 2019-12-22 DIAGNOSIS — M5116 Intervertebral disc disorders with radiculopathy, lumbar region: Secondary | ICD-10-CM | POA: Diagnosis not present

## 2019-12-22 DIAGNOSIS — R600 Localized edema: Secondary | ICD-10-CM | POA: Diagnosis not present

## 2019-12-22 DIAGNOSIS — Z87891 Personal history of nicotine dependence: Secondary | ICD-10-CM | POA: Diagnosis not present

## 2019-12-30 DIAGNOSIS — Z87891 Personal history of nicotine dependence: Secondary | ICD-10-CM | POA: Diagnosis not present

## 2019-12-30 DIAGNOSIS — G2581 Restless legs syndrome: Secondary | ICD-10-CM | POA: Diagnosis not present

## 2019-12-30 DIAGNOSIS — E039 Hypothyroidism, unspecified: Secondary | ICD-10-CM | POA: Diagnosis not present

## 2019-12-30 DIAGNOSIS — Z923 Personal history of irradiation: Secondary | ICD-10-CM | POA: Diagnosis not present

## 2019-12-30 DIAGNOSIS — K219 Gastro-esophageal reflux disease without esophagitis: Secondary | ICD-10-CM | POA: Diagnosis not present

## 2019-12-30 DIAGNOSIS — R1314 Dysphagia, pharyngoesophageal phase: Secondary | ICD-10-CM | POA: Diagnosis not present

## 2019-12-30 DIAGNOSIS — R600 Localized edema: Secondary | ICD-10-CM | POA: Diagnosis not present

## 2019-12-30 DIAGNOSIS — M1990 Unspecified osteoarthritis, unspecified site: Secondary | ICD-10-CM | POA: Diagnosis not present

## 2019-12-30 DIAGNOSIS — I10 Essential (primary) hypertension: Secondary | ICD-10-CM | POA: Diagnosis not present

## 2019-12-30 DIAGNOSIS — Z9221 Personal history of antineoplastic chemotherapy: Secondary | ICD-10-CM | POA: Diagnosis not present

## 2019-12-30 DIAGNOSIS — Z85118 Personal history of other malignant neoplasm of bronchus and lung: Secondary | ICD-10-CM | POA: Diagnosis not present

## 2019-12-30 DIAGNOSIS — J9611 Chronic respiratory failure with hypoxia: Secondary | ICD-10-CM | POA: Diagnosis not present

## 2019-12-30 DIAGNOSIS — J449 Chronic obstructive pulmonary disease, unspecified: Secondary | ICD-10-CM | POA: Diagnosis not present

## 2019-12-30 DIAGNOSIS — Z9981 Dependence on supplemental oxygen: Secondary | ICD-10-CM | POA: Diagnosis not present

## 2019-12-30 DIAGNOSIS — E559 Vitamin D deficiency, unspecified: Secondary | ICD-10-CM | POA: Diagnosis not present

## 2019-12-30 DIAGNOSIS — E222 Syndrome of inappropriate secretion of antidiuretic hormone: Secondary | ICD-10-CM | POA: Diagnosis not present

## 2019-12-30 DIAGNOSIS — M5116 Intervertebral disc disorders with radiculopathy, lumbar region: Secondary | ICD-10-CM | POA: Diagnosis not present

## 2019-12-31 DIAGNOSIS — R1314 Dysphagia, pharyngoesophageal phase: Secondary | ICD-10-CM | POA: Diagnosis not present

## 2019-12-31 DIAGNOSIS — Z923 Personal history of irradiation: Secondary | ICD-10-CM | POA: Diagnosis not present

## 2019-12-31 DIAGNOSIS — Z87891 Personal history of nicotine dependence: Secondary | ICD-10-CM | POA: Diagnosis not present

## 2019-12-31 DIAGNOSIS — Z9221 Personal history of antineoplastic chemotherapy: Secondary | ICD-10-CM | POA: Diagnosis not present

## 2019-12-31 DIAGNOSIS — G2581 Restless legs syndrome: Secondary | ICD-10-CM | POA: Diagnosis not present

## 2019-12-31 DIAGNOSIS — E559 Vitamin D deficiency, unspecified: Secondary | ICD-10-CM | POA: Diagnosis not present

## 2019-12-31 DIAGNOSIS — R600 Localized edema: Secondary | ICD-10-CM | POA: Diagnosis not present

## 2019-12-31 DIAGNOSIS — Z85118 Personal history of other malignant neoplasm of bronchus and lung: Secondary | ICD-10-CM | POA: Diagnosis not present

## 2019-12-31 DIAGNOSIS — Z9981 Dependence on supplemental oxygen: Secondary | ICD-10-CM | POA: Diagnosis not present

## 2019-12-31 DIAGNOSIS — M5116 Intervertebral disc disorders with radiculopathy, lumbar region: Secondary | ICD-10-CM | POA: Diagnosis not present

## 2019-12-31 DIAGNOSIS — I10 Essential (primary) hypertension: Secondary | ICD-10-CM | POA: Diagnosis not present

## 2019-12-31 DIAGNOSIS — K219 Gastro-esophageal reflux disease without esophagitis: Secondary | ICD-10-CM | POA: Diagnosis not present

## 2019-12-31 DIAGNOSIS — J9611 Chronic respiratory failure with hypoxia: Secondary | ICD-10-CM | POA: Diagnosis not present

## 2019-12-31 DIAGNOSIS — J449 Chronic obstructive pulmonary disease, unspecified: Secondary | ICD-10-CM | POA: Diagnosis not present

## 2019-12-31 DIAGNOSIS — E039 Hypothyroidism, unspecified: Secondary | ICD-10-CM | POA: Diagnosis not present

## 2019-12-31 DIAGNOSIS — M1990 Unspecified osteoarthritis, unspecified site: Secondary | ICD-10-CM | POA: Diagnosis not present

## 2019-12-31 DIAGNOSIS — E222 Syndrome of inappropriate secretion of antidiuretic hormone: Secondary | ICD-10-CM | POA: Diagnosis not present

## 2020-01-03 DIAGNOSIS — J9611 Chronic respiratory failure with hypoxia: Secondary | ICD-10-CM | POA: Diagnosis not present

## 2020-01-03 DIAGNOSIS — I10 Essential (primary) hypertension: Secondary | ICD-10-CM | POA: Diagnosis not present

## 2020-01-03 DIAGNOSIS — E559 Vitamin D deficiency, unspecified: Secondary | ICD-10-CM | POA: Diagnosis not present

## 2020-01-03 DIAGNOSIS — K219 Gastro-esophageal reflux disease without esophagitis: Secondary | ICD-10-CM | POA: Diagnosis not present

## 2020-01-03 DIAGNOSIS — G2581 Restless legs syndrome: Secondary | ICD-10-CM | POA: Diagnosis not present

## 2020-01-03 DIAGNOSIS — J449 Chronic obstructive pulmonary disease, unspecified: Secondary | ICD-10-CM | POA: Diagnosis not present

## 2020-01-03 DIAGNOSIS — R1314 Dysphagia, pharyngoesophageal phase: Secondary | ICD-10-CM | POA: Diagnosis not present

## 2020-01-03 DIAGNOSIS — R600 Localized edema: Secondary | ICD-10-CM | POA: Diagnosis not present

## 2020-01-03 DIAGNOSIS — Z85118 Personal history of other malignant neoplasm of bronchus and lung: Secondary | ICD-10-CM | POA: Diagnosis not present

## 2020-01-03 DIAGNOSIS — Z9981 Dependence on supplemental oxygen: Secondary | ICD-10-CM | POA: Diagnosis not present

## 2020-01-03 DIAGNOSIS — M1990 Unspecified osteoarthritis, unspecified site: Secondary | ICD-10-CM | POA: Diagnosis not present

## 2020-01-03 DIAGNOSIS — Z87891 Personal history of nicotine dependence: Secondary | ICD-10-CM | POA: Diagnosis not present

## 2020-01-03 DIAGNOSIS — Z923 Personal history of irradiation: Secondary | ICD-10-CM | POA: Diagnosis not present

## 2020-01-03 DIAGNOSIS — M5116 Intervertebral disc disorders with radiculopathy, lumbar region: Secondary | ICD-10-CM | POA: Diagnosis not present

## 2020-01-03 DIAGNOSIS — E222 Syndrome of inappropriate secretion of antidiuretic hormone: Secondary | ICD-10-CM | POA: Diagnosis not present

## 2020-01-03 DIAGNOSIS — E039 Hypothyroidism, unspecified: Secondary | ICD-10-CM | POA: Diagnosis not present

## 2020-01-03 DIAGNOSIS — Z9221 Personal history of antineoplastic chemotherapy: Secondary | ICD-10-CM | POA: Diagnosis not present

## 2020-01-07 DIAGNOSIS — R059 Cough, unspecified: Secondary | ICD-10-CM | POA: Diagnosis not present

## 2020-01-07 DIAGNOSIS — J449 Chronic obstructive pulmonary disease, unspecified: Secondary | ICD-10-CM | POA: Diagnosis not present

## 2020-01-07 DIAGNOSIS — J9690 Respiratory failure, unspecified, unspecified whether with hypoxia or hypercapnia: Secondary | ICD-10-CM | POA: Diagnosis not present

## 2020-01-07 DIAGNOSIS — J441 Chronic obstructive pulmonary disease with (acute) exacerbation: Secondary | ICD-10-CM | POA: Diagnosis not present

## 2020-01-07 DIAGNOSIS — D72829 Elevated white blood cell count, unspecified: Secondary | ICD-10-CM | POA: Diagnosis not present

## 2020-01-07 DIAGNOSIS — Z79899 Other long term (current) drug therapy: Secondary | ICD-10-CM | POA: Diagnosis not present

## 2020-01-07 DIAGNOSIS — R918 Other nonspecific abnormal finding of lung field: Secondary | ICD-10-CM | POA: Diagnosis not present

## 2020-01-07 DIAGNOSIS — Z7951 Long term (current) use of inhaled steroids: Secondary | ICD-10-CM | POA: Diagnosis not present

## 2020-01-07 DIAGNOSIS — R110 Nausea: Secondary | ICD-10-CM | POA: Diagnosis not present

## 2020-01-07 DIAGNOSIS — C3490 Malignant neoplasm of unspecified part of unspecified bronchus or lung: Secondary | ICD-10-CM | POA: Diagnosis not present

## 2020-01-07 DIAGNOSIS — I10 Essential (primary) hypertension: Secondary | ICD-10-CM | POA: Diagnosis not present

## 2020-01-07 DIAGNOSIS — J90 Pleural effusion, not elsewhere classified: Secondary | ICD-10-CM | POA: Diagnosis not present

## 2020-01-07 DIAGNOSIS — I700 Atherosclerosis of aorta: Secondary | ICD-10-CM | POA: Diagnosis not present

## 2020-01-07 DIAGNOSIS — Z9981 Dependence on supplemental oxygen: Secondary | ICD-10-CM | POA: Diagnosis not present

## 2020-01-07 DIAGNOSIS — Z85118 Personal history of other malignant neoplasm of bronchus and lung: Secondary | ICD-10-CM | POA: Diagnosis not present

## 2020-01-07 DIAGNOSIS — Z9114 Patient's other noncompliance with medication regimen: Secondary | ICD-10-CM | POA: Diagnosis not present

## 2020-01-07 DIAGNOSIS — E039 Hypothyroidism, unspecified: Secondary | ICD-10-CM | POA: Diagnosis not present

## 2020-01-07 DIAGNOSIS — E222 Syndrome of inappropriate secretion of antidiuretic hormone: Secondary | ICD-10-CM | POA: Diagnosis not present

## 2020-01-07 DIAGNOSIS — Z87891 Personal history of nicotine dependence: Secondary | ICD-10-CM | POA: Diagnosis not present

## 2020-01-07 DIAGNOSIS — J80 Acute respiratory distress syndrome: Secondary | ICD-10-CM | POA: Diagnosis not present

## 2020-01-07 DIAGNOSIS — R069 Unspecified abnormalities of breathing: Secondary | ICD-10-CM | POA: Diagnosis not present

## 2020-01-07 DIAGNOSIS — K219 Gastro-esophageal reflux disease without esophagitis: Secondary | ICD-10-CM | POA: Diagnosis not present

## 2020-01-07 DIAGNOSIS — R042 Hemoptysis: Secondary | ICD-10-CM | POA: Diagnosis not present

## 2020-01-07 DIAGNOSIS — M1990 Unspecified osteoarthritis, unspecified site: Secondary | ICD-10-CM | POA: Diagnosis not present

## 2020-01-07 DIAGNOSIS — E871 Hypo-osmolality and hyponatremia: Secondary | ICD-10-CM | POA: Diagnosis not present

## 2020-01-07 DIAGNOSIS — Z743 Need for continuous supervision: Secondary | ICD-10-CM | POA: Diagnosis not present

## 2020-01-07 DIAGNOSIS — E559 Vitamin D deficiency, unspecified: Secondary | ICD-10-CM | POA: Diagnosis not present

## 2020-01-07 DIAGNOSIS — D649 Anemia, unspecified: Secondary | ICD-10-CM | POA: Diagnosis not present

## 2020-01-07 DIAGNOSIS — J9611 Chronic respiratory failure with hypoxia: Secondary | ICD-10-CM | POA: Diagnosis not present

## 2020-01-07 DIAGNOSIS — J9622 Acute and chronic respiratory failure with hypercapnia: Secondary | ICD-10-CM | POA: Diagnosis not present

## 2020-01-07 DIAGNOSIS — C3432 Malignant neoplasm of lower lobe, left bronchus or lung: Secondary | ICD-10-CM | POA: Diagnosis not present

## 2020-01-07 DIAGNOSIS — R0602 Shortness of breath: Secondary | ICD-10-CM | POA: Diagnosis not present

## 2020-01-07 DIAGNOSIS — R9431 Abnormal electrocardiogram [ECG] [EKG]: Secondary | ICD-10-CM | POA: Diagnosis not present

## 2020-01-07 DIAGNOSIS — D492 Neoplasm of unspecified behavior of bone, soft tissue, and skin: Secondary | ICD-10-CM | POA: Diagnosis not present

## 2020-01-07 DIAGNOSIS — J9621 Acute and chronic respiratory failure with hypoxia: Secondary | ICD-10-CM | POA: Diagnosis not present

## 2020-01-07 DIAGNOSIS — Z9221 Personal history of antineoplastic chemotherapy: Secondary | ICD-10-CM | POA: Diagnosis not present

## 2020-01-07 DIAGNOSIS — J439 Emphysema, unspecified: Secondary | ICD-10-CM | POA: Diagnosis not present

## 2020-01-07 DIAGNOSIS — Z923 Personal history of irradiation: Secondary | ICD-10-CM | POA: Diagnosis not present

## 2020-01-07 DIAGNOSIS — J984 Other disorders of lung: Secondary | ICD-10-CM | POA: Diagnosis not present

## 2020-01-08 DIAGNOSIS — R110 Nausea: Secondary | ICD-10-CM | POA: Diagnosis not present

## 2020-01-08 DIAGNOSIS — E871 Hypo-osmolality and hyponatremia: Secondary | ICD-10-CM | POA: Diagnosis not present

## 2020-01-08 DIAGNOSIS — J439 Emphysema, unspecified: Secondary | ICD-10-CM | POA: Diagnosis not present

## 2020-01-08 DIAGNOSIS — E222 Syndrome of inappropriate secretion of antidiuretic hormone: Secondary | ICD-10-CM | POA: Diagnosis not present

## 2020-01-08 DIAGNOSIS — I10 Essential (primary) hypertension: Secondary | ICD-10-CM | POA: Diagnosis not present

## 2020-01-08 DIAGNOSIS — D492 Neoplasm of unspecified behavior of bone, soft tissue, and skin: Secondary | ICD-10-CM | POA: Diagnosis not present

## 2020-01-08 DIAGNOSIS — E039 Hypothyroidism, unspecified: Secondary | ICD-10-CM | POA: Diagnosis not present

## 2020-01-08 DIAGNOSIS — I700 Atherosclerosis of aorta: Secondary | ICD-10-CM | POA: Diagnosis not present

## 2020-01-08 DIAGNOSIS — R042 Hemoptysis: Secondary | ICD-10-CM | POA: Diagnosis not present

## 2020-01-08 DIAGNOSIS — J9611 Chronic respiratory failure with hypoxia: Secondary | ICD-10-CM | POA: Diagnosis not present

## 2020-01-08 DIAGNOSIS — Z85118 Personal history of other malignant neoplasm of bronchus and lung: Secondary | ICD-10-CM | POA: Diagnosis not present

## 2020-01-08 DIAGNOSIS — J449 Chronic obstructive pulmonary disease, unspecified: Secondary | ICD-10-CM | POA: Diagnosis not present

## 2020-01-08 DIAGNOSIS — J9690 Respiratory failure, unspecified, unspecified whether with hypoxia or hypercapnia: Secondary | ICD-10-CM | POA: Diagnosis not present

## 2020-01-08 DIAGNOSIS — J441 Chronic obstructive pulmonary disease with (acute) exacerbation: Secondary | ICD-10-CM | POA: Diagnosis not present

## 2020-01-09 DIAGNOSIS — I10 Essential (primary) hypertension: Secondary | ICD-10-CM | POA: Diagnosis not present

## 2020-01-09 DIAGNOSIS — D72829 Elevated white blood cell count, unspecified: Secondary | ICD-10-CM | POA: Diagnosis not present

## 2020-01-09 DIAGNOSIS — R110 Nausea: Secondary | ICD-10-CM | POA: Diagnosis not present

## 2020-01-09 DIAGNOSIS — J9611 Chronic respiratory failure with hypoxia: Secondary | ICD-10-CM | POA: Diagnosis not present

## 2020-01-09 DIAGNOSIS — E039 Hypothyroidism, unspecified: Secondary | ICD-10-CM | POA: Diagnosis not present

## 2020-01-09 DIAGNOSIS — E222 Syndrome of inappropriate secretion of antidiuretic hormone: Secondary | ICD-10-CM | POA: Diagnosis not present

## 2020-01-09 DIAGNOSIS — E871 Hypo-osmolality and hyponatremia: Secondary | ICD-10-CM | POA: Diagnosis not present

## 2020-01-09 DIAGNOSIS — J9690 Respiratory failure, unspecified, unspecified whether with hypoxia or hypercapnia: Secondary | ICD-10-CM | POA: Diagnosis not present

## 2020-01-09 DIAGNOSIS — J441 Chronic obstructive pulmonary disease with (acute) exacerbation: Secondary | ICD-10-CM | POA: Diagnosis not present

## 2020-01-10 DIAGNOSIS — J441 Chronic obstructive pulmonary disease with (acute) exacerbation: Secondary | ICD-10-CM | POA: Diagnosis not present

## 2020-01-10 DIAGNOSIS — R042 Hemoptysis: Secondary | ICD-10-CM | POA: Diagnosis not present

## 2020-01-10 DIAGNOSIS — E039 Hypothyroidism, unspecified: Secondary | ICD-10-CM | POA: Diagnosis not present

## 2020-01-10 DIAGNOSIS — I10 Essential (primary) hypertension: Secondary | ICD-10-CM | POA: Diagnosis not present

## 2020-01-10 DIAGNOSIS — R918 Other nonspecific abnormal finding of lung field: Secondary | ICD-10-CM | POA: Diagnosis not present

## 2020-01-10 DIAGNOSIS — J9611 Chronic respiratory failure with hypoxia: Secondary | ICD-10-CM | POA: Diagnosis not present

## 2020-01-10 DIAGNOSIS — J9690 Respiratory failure, unspecified, unspecified whether with hypoxia or hypercapnia: Secondary | ICD-10-CM | POA: Diagnosis not present

## 2020-01-10 DIAGNOSIS — E871 Hypo-osmolality and hyponatremia: Secondary | ICD-10-CM | POA: Diagnosis not present

## 2020-01-10 DIAGNOSIS — D72829 Elevated white blood cell count, unspecified: Secondary | ICD-10-CM | POA: Diagnosis not present

## 2020-01-11 DIAGNOSIS — J441 Chronic obstructive pulmonary disease with (acute) exacerbation: Secondary | ICD-10-CM | POA: Diagnosis not present

## 2020-01-11 DIAGNOSIS — D72829 Elevated white blood cell count, unspecified: Secondary | ICD-10-CM | POA: Diagnosis not present

## 2020-01-11 DIAGNOSIS — J9611 Chronic respiratory failure with hypoxia: Secondary | ICD-10-CM | POA: Diagnosis not present

## 2020-01-11 DIAGNOSIS — E871 Hypo-osmolality and hyponatremia: Secondary | ICD-10-CM | POA: Diagnosis not present

## 2020-01-11 DIAGNOSIS — R042 Hemoptysis: Secondary | ICD-10-CM | POA: Diagnosis not present

## 2020-01-11 DIAGNOSIS — J984 Other disorders of lung: Secondary | ICD-10-CM | POA: Diagnosis not present

## 2020-01-11 DIAGNOSIS — C3432 Malignant neoplasm of lower lobe, left bronchus or lung: Secondary | ICD-10-CM | POA: Diagnosis not present

## 2020-01-12 DIAGNOSIS — J9611 Chronic respiratory failure with hypoxia: Secondary | ICD-10-CM | POA: Diagnosis not present

## 2020-01-12 DIAGNOSIS — C3432 Malignant neoplasm of lower lobe, left bronchus or lung: Secondary | ICD-10-CM | POA: Diagnosis not present

## 2020-01-12 DIAGNOSIS — E039 Hypothyroidism, unspecified: Secondary | ICD-10-CM | POA: Diagnosis not present

## 2020-01-12 DIAGNOSIS — R042 Hemoptysis: Secondary | ICD-10-CM | POA: Diagnosis not present

## 2020-01-12 DIAGNOSIS — D72829 Elevated white blood cell count, unspecified: Secondary | ICD-10-CM | POA: Diagnosis not present

## 2020-01-12 DIAGNOSIS — E871 Hypo-osmolality and hyponatremia: Secondary | ICD-10-CM | POA: Diagnosis not present

## 2020-01-12 DIAGNOSIS — J441 Chronic obstructive pulmonary disease with (acute) exacerbation: Secondary | ICD-10-CM | POA: Diagnosis not present

## 2020-01-13 DIAGNOSIS — J9611 Chronic respiratory failure with hypoxia: Secondary | ICD-10-CM | POA: Diagnosis not present

## 2020-01-13 DIAGNOSIS — C3432 Malignant neoplasm of lower lobe, left bronchus or lung: Secondary | ICD-10-CM | POA: Diagnosis not present

## 2020-01-13 DIAGNOSIS — D72829 Elevated white blood cell count, unspecified: Secondary | ICD-10-CM | POA: Diagnosis not present

## 2020-01-13 DIAGNOSIS — E039 Hypothyroidism, unspecified: Secondary | ICD-10-CM | POA: Diagnosis not present

## 2020-01-13 DIAGNOSIS — E871 Hypo-osmolality and hyponatremia: Secondary | ICD-10-CM | POA: Diagnosis not present

## 2020-01-18 DIAGNOSIS — G319 Degenerative disease of nervous system, unspecified: Secondary | ICD-10-CM | POA: Diagnosis not present

## 2020-01-18 DIAGNOSIS — Z7901 Long term (current) use of anticoagulants: Secondary | ICD-10-CM | POA: Diagnosis not present

## 2020-01-18 DIAGNOSIS — C3481 Malignant neoplasm of overlapping sites of right bronchus and lung: Secondary | ICD-10-CM | POA: Diagnosis not present

## 2020-01-18 DIAGNOSIS — R970 Elevated carcinoembryonic antigen [CEA]: Secondary | ICD-10-CM | POA: Diagnosis not present

## 2020-01-18 DIAGNOSIS — J449 Chronic obstructive pulmonary disease, unspecified: Secondary | ICD-10-CM | POA: Diagnosis not present

## 2020-01-18 DIAGNOSIS — Z85118 Personal history of other malignant neoplasm of bronchus and lung: Secondary | ICD-10-CM | POA: Diagnosis not present

## 2020-01-18 DIAGNOSIS — J45998 Other asthma: Secondary | ICD-10-CM | POA: Diagnosis not present

## 2020-01-18 DIAGNOSIS — I6782 Cerebral ischemia: Secondary | ICD-10-CM | POA: Diagnosis not present

## 2020-01-18 DIAGNOSIS — G9389 Other specified disorders of brain: Secondary | ICD-10-CM | POA: Diagnosis not present

## 2020-01-18 DIAGNOSIS — Z9981 Dependence on supplemental oxygen: Secondary | ICD-10-CM | POA: Diagnosis not present

## 2020-01-18 DIAGNOSIS — Z85038 Personal history of other malignant neoplasm of large intestine: Secondary | ICD-10-CM | POA: Diagnosis not present

## 2020-01-18 DIAGNOSIS — J341 Cyst and mucocele of nose and nasal sinus: Secondary | ICD-10-CM | POA: Diagnosis not present

## 2020-01-20 ENCOUNTER — Encounter: Payer: Self-pay | Admitting: Oncology

## 2020-01-20 DIAGNOSIS — D649 Anemia, unspecified: Secondary | ICD-10-CM | POA: Diagnosis not present

## 2020-01-20 DIAGNOSIS — Z7901 Long term (current) use of anticoagulants: Secondary | ICD-10-CM | POA: Diagnosis not present

## 2020-01-20 DIAGNOSIS — C3402 Malignant neoplasm of left main bronchus: Secondary | ICD-10-CM | POA: Diagnosis not present

## 2020-01-20 DIAGNOSIS — R970 Elevated carcinoembryonic antigen [CEA]: Secondary | ICD-10-CM | POA: Diagnosis not present

## 2020-01-20 DIAGNOSIS — J449 Chronic obstructive pulmonary disease, unspecified: Secondary | ICD-10-CM | POA: Diagnosis not present

## 2020-01-20 DIAGNOSIS — Z853 Personal history of malignant neoplasm of breast: Secondary | ICD-10-CM | POA: Diagnosis not present

## 2020-01-20 DIAGNOSIS — C3432 Malignant neoplasm of lower lobe, left bronchus or lung: Secondary | ICD-10-CM | POA: Insufficient documentation

## 2020-01-20 DIAGNOSIS — E538 Deficiency of other specified B group vitamins: Secondary | ICD-10-CM | POA: Diagnosis not present

## 2020-01-20 DIAGNOSIS — R918 Other nonspecific abnormal finding of lung field: Secondary | ICD-10-CM | POA: Diagnosis not present

## 2020-01-20 DIAGNOSIS — Z9981 Dependence on supplemental oxygen: Secondary | ICD-10-CM | POA: Diagnosis not present

## 2020-01-20 DIAGNOSIS — C3430 Malignant neoplasm of lower lobe, unspecified bronchus or lung: Secondary | ICD-10-CM | POA: Insufficient documentation

## 2020-01-20 DIAGNOSIS — Z85118 Personal history of other malignant neoplasm of bronchus and lung: Secondary | ICD-10-CM | POA: Diagnosis not present

## 2020-01-20 DIAGNOSIS — Z85038 Personal history of other malignant neoplasm of large intestine: Secondary | ICD-10-CM | POA: Diagnosis not present

## 2020-01-20 LAB — CREATININE, SERUM: Creatinine: 0.7 (ref <=1.1)

## 2020-01-21 ENCOUNTER — Other Ambulatory Visit: Payer: Self-pay | Admitting: Oncology

## 2020-01-21 ENCOUNTER — Encounter: Payer: Self-pay | Admitting: Pharmacist

## 2020-01-21 DIAGNOSIS — E222 Syndrome of inappropriate secretion of antidiuretic hormone: Secondary | ICD-10-CM | POA: Insufficient documentation

## 2020-01-21 DIAGNOSIS — E871 Hypo-osmolality and hyponatremia: Secondary | ICD-10-CM | POA: Insufficient documentation

## 2020-01-21 DIAGNOSIS — C3430 Malignant neoplasm of lower lobe, unspecified bronchus or lung: Secondary | ICD-10-CM

## 2020-01-24 ENCOUNTER — Inpatient Hospital Stay: Payer: Medicare Other | Attending: Hematology and Oncology | Admitting: Hematology and Oncology

## 2020-01-24 DIAGNOSIS — E876 Hypokalemia: Secondary | ICD-10-CM | POA: Insufficient documentation

## 2020-01-24 DIAGNOSIS — J449 Chronic obstructive pulmonary disease, unspecified: Secondary | ICD-10-CM | POA: Insufficient documentation

## 2020-01-24 DIAGNOSIS — C3411 Malignant neoplasm of upper lobe, right bronchus or lung: Secondary | ICD-10-CM | POA: Insufficient documentation

## 2020-01-24 DIAGNOSIS — Z5111 Encounter for antineoplastic chemotherapy: Secondary | ICD-10-CM | POA: Insufficient documentation

## 2020-01-24 DIAGNOSIS — D649 Anemia, unspecified: Secondary | ICD-10-CM | POA: Insufficient documentation

## 2020-01-24 DIAGNOSIS — Z85038 Personal history of other malignant neoplasm of large intestine: Secondary | ICD-10-CM | POA: Insufficient documentation

## 2020-01-24 DIAGNOSIS — E871 Hypo-osmolality and hyponatremia: Secondary | ICD-10-CM | POA: Insufficient documentation

## 2020-01-24 DIAGNOSIS — D241 Benign neoplasm of right breast: Secondary | ICD-10-CM | POA: Insufficient documentation

## 2020-01-24 NOTE — Progress Notes (Deleted)
The patient is a with newly diagnosed.  Patient presents to clinic today for chemotherapy education and palliative care consult.  We will start.  We will send in prescriptions for prochlorperazine and ondansetron.  The patient verbalizes understanding of and agreement to the plan as discussed today.  Provided general information including the following: 1.  Date of education: 2.  Physician name: 3.  Diagnosis: 4.  Stage: 5.  Curative or palliative 6.  Chemotherapy plan including drugs and how often: 7.  Start date: 49.  Other referrals: 9.  The patient is to call our office with any questions or concerns.  Our office number [phone], if after hours or on the weekend, call the same number and wait for the answering service.  There is always an oncologist on call 10.  Medications prescribed: 11.  The patient has verbalized understanding of the treatment plan and has no barriers to adherence or understanding.  Obtained signed consent from patient.  Discussed symptoms including 1.  Low blood counts including red blood cells, white blood cells and platelets. 2. Infection including to avoid large crowds, wash hands frequently, and stay away from people who were sick.  If fever develops of 100.4 or higher, call our office. 3.  Mucositis-given instructions on mouth rinse (baking soda and salt mixture).  Keep mouth clean.  Use soft bristle toothbrush.  If mouth sores develop, call our clinic. 4.  Nausea/vomiting-gave prescriptions for ondansetron 4 mg every 4 hours as needed for nausea, may take around the clock if persistent.  Compazine 10 mg every 6 hours, may take around the clock if persistent. 5.  Diarrhea-use over-the-counter Imodium.  Call clinic if not controlled. 6.  Constipation-use senna, 1 to 2 tablets twice a day.  If no BM in 2 to 3 days call the clinic. 7.  Loss of appetite-try to eat small meals every 2-3 hours.  Call clinic if not eating. 8.  Taste changes-zinc 500 mg daily.  If  becomes severe call clinic. 9.  Alcoholic beverages. 10.  Drink 2 to 3 quarts of water per day. 11.  Peripheral neuropathy-patient to call if numbness or tingling in hands or feet is persistent  Neulasta-will be given 24 to 48 hours after chemotherapy.  Gave information sheet on bone and joint pain.  Use Claritin or Pepcid.  May use ibuprofen or Aleve.  Call if symptoms persist or are unbearable.  Gave information on the supportive care team and how to contact them regarding services.  Discussed advanced directives.  The patient does not have their advanced directives but will look at the copy provided in their notebook and will call with any questions. [employer] Social worker Advanced directives  Answered questions to patient satisfaction.  Patient is to call with any further questions or concerns.  Time spent on this palliative care/chemotherapy education was 60 minutes with more than 50% spent discussing diagnosis, prognosis and symptom management.  The medication prescribed to the patient will be printed out from [email or web address] This will give the following information: Name of your medication Approved uses Dose and schedule Storage and handling Handling body fluids and waste Drug and food interactions Possible side effects and management Pregnancy, sexual activity, and contraception Obtaining medication

## 2020-01-25 NOTE — Progress Notes (Signed)
PT STABLE AT TIME OF DISCHARGE 

## 2020-01-25 NOTE — Progress Notes (Signed)
The patient is a with newly diagnosed Stage IIIB lung cancer (pT3 pN2, cM0).  Patient presents to clinic today for chemotherapy education and palliative care consult.  We will start.  We will send in prescriptions for prochlorperazine and ondansetron.  The patient verbalizes understanding of and agreement to the plan as discussed today.  Provided general information including the following: 1.  Date of education: 01/25/2020 2.  Physician name: Dr. Hinton Rao 3.  Diagnosis: Lung cancer, lower lobe Stage IIIB (pT3 pN2 cM0) 4.  Stage: IIIB 5.  Palliative 6.  Chemotherapy plan including drugs and how often: Carboplatin, Etoposide, Pegfilgrastim 7.  Start date: 01/31/2020 8.  Other referrals: No other referrals at this time 9.  The patient is to call our office with any questions or concerns.  Our office number [phone], if after hours or on the weekend, call the same number and wait for the answering service.  There is always an oncologist on call 10.  Medications prescribed: 11.  The patient has verbalized understanding of the treatment plan and has no barriers to adherence or understanding.  Obtained signed consent from patient.  Discussed symptoms including 1.  Low blood counts including red blood cells, white blood cells and platelets. 2. Infection including to avoid large crowds, wash hands frequently, and stay away from people who were sick.  If fever develops of 100.4 or higher, call our office. 3.  Mucositis-given instructions on mouth rinse (baking soda and salt mixture).  Keep mouth clean.  Use soft bristle toothbrush.  If mouth sores develop, call our clinic. 4.  Nausea/vomiting-gave prescriptions for ondansetron 4 mg every 4 hours as needed for nausea, may take around the clock if persistent.  Compazine 10 mg every 6 hours, may take around the clock if persistent. 5.  Diarrhea-use over-the-counter Imodium.  Call clinic if not controlled. 6.  Constipation-use senna, 1 to 2 tablets  twice a day.  If no BM in 2 to 3 days call the clinic. 7.  Loss of appetite-try to eat small meals every 2-3 hours.  Call clinic if not eating. 8.  Taste changes-zinc 500 mg daily.  If becomes severe call clinic. 9.  Alcoholic beverages. 10.  Drink 2 to 3 quarts of water per day. 11.  Peripheral neuropathy-patient to call if numbness or tingling in hands or feet is persistent  Neulasta-will be given 24 to 48 hours after chemotherapy.  Gave information sheet on bone and joint pain.  Use Claritin or Pepcid.  May use ibuprofen or Aleve.  Call if symptoms persist or are unbearable.  Gave information on the supportive care team and how to contact them regarding services.  Discussed advanced directives.  The patient does not have their advanced directives but will look at the copy provided in their notebook and will call with any questions. [employer] Social worker Advanced directives  Answered questions to patient satisfaction.  Patient is to call with any further questions or concerns.  Time spent on this palliative care/chemotherapy education was 60 minutes with more than 50% spent discussing diagnosis, prognosis and symptom management.

## 2020-01-26 ENCOUNTER — Other Ambulatory Visit: Payer: Self-pay

## 2020-01-26 ENCOUNTER — Inpatient Hospital Stay: Payer: Medicare Other

## 2020-01-26 ENCOUNTER — Inpatient Hospital Stay (INDEPENDENT_AMBULATORY_CARE_PROVIDER_SITE_OTHER): Payer: Medicare Other | Admitting: Hematology and Oncology

## 2020-01-26 ENCOUNTER — Other Ambulatory Visit: Payer: Self-pay | Admitting: Hematology and Oncology

## 2020-01-26 DIAGNOSIS — E871 Hypo-osmolality and hyponatremia: Secondary | ICD-10-CM | POA: Diagnosis not present

## 2020-01-26 DIAGNOSIS — J449 Chronic obstructive pulmonary disease, unspecified: Secondary | ICD-10-CM | POA: Diagnosis not present

## 2020-01-26 DIAGNOSIS — E876 Hypokalemia: Secondary | ICD-10-CM | POA: Diagnosis not present

## 2020-01-26 DIAGNOSIS — J9611 Chronic respiratory failure with hypoxia: Secondary | ICD-10-CM | POA: Diagnosis not present

## 2020-01-26 DIAGNOSIS — J301 Allergic rhinitis due to pollen: Secondary | ICD-10-CM | POA: Diagnosis not present

## 2020-01-26 DIAGNOSIS — Z85038 Personal history of other malignant neoplasm of large intestine: Secondary | ICD-10-CM | POA: Diagnosis not present

## 2020-01-26 DIAGNOSIS — R918 Other nonspecific abnormal finding of lung field: Secondary | ICD-10-CM | POA: Diagnosis not present

## 2020-01-26 DIAGNOSIS — C3430 Malignant neoplasm of lower lobe, unspecified bronchus or lung: Secondary | ICD-10-CM

## 2020-01-26 DIAGNOSIS — C3411 Malignant neoplasm of upper lobe, right bronchus or lung: Secondary | ICD-10-CM | POA: Diagnosis not present

## 2020-01-26 DIAGNOSIS — Z5111 Encounter for antineoplastic chemotherapy: Secondary | ICD-10-CM | POA: Diagnosis not present

## 2020-01-26 DIAGNOSIS — D649 Anemia, unspecified: Secondary | ICD-10-CM | POA: Diagnosis not present

## 2020-01-26 DIAGNOSIS — D241 Benign neoplasm of right breast: Secondary | ICD-10-CM | POA: Diagnosis not present

## 2020-01-26 LAB — CBC WITH DIFFERENTIAL (CANCER CENTER ONLY)
Abs Immature Granulocytes: 0.12 10*3/uL — ABNORMAL HIGH (ref 0.00–0.07)
Basophils Absolute: 0.1 10*3/uL (ref 0.0–0.1)
Basophils Relative: 1 %
Eosinophils Absolute: 0.2 10*3/uL (ref 0.0–0.5)
Eosinophils Relative: 2 %
HCT: 36.8 % (ref 36.0–46.0)
Hemoglobin: 11.4 g/dL — ABNORMAL LOW (ref 12.0–15.0)
Immature Granulocytes: 1 %
Lymphocytes Relative: 19 %
Lymphs Abs: 1.9 10*3/uL (ref 0.7–4.0)
MCH: 28.4 pg (ref 26.0–34.0)
MCHC: 31 g/dL (ref 30.0–36.0)
MCV: 91.8 fL (ref 80.0–100.0)
Monocytes Absolute: 0.7 10*3/uL (ref 0.1–1.0)
Monocytes Relative: 8 %
Neutro Abs: 6.9 10*3/uL (ref 1.7–7.7)
Neutrophils Relative %: 69 %
Platelet Count: 270 10*3/uL (ref 150–400)
RBC: 4.01 MIL/uL (ref 3.87–5.11)
RDW: 14.9 % (ref 11.5–15.5)
WBC Count: 9.9 10*3/uL (ref 4.0–10.5)
nRBC: 0 % (ref 0.0–0.2)

## 2020-01-26 LAB — CMP (CANCER CENTER ONLY)
ALT: 25 U/L (ref 0–44)
AST: 20 U/L (ref 15–41)
Albumin: 4 g/dL (ref 3.5–5.0)
Alkaline Phosphatase: 105 U/L (ref 38–126)
Anion gap: 12 (ref 5–15)
BUN: 21 mg/dL (ref 8–23)
CO2: 25 mmol/L (ref 22–32)
Calcium: 8.7 mg/dL — ABNORMAL LOW (ref 8.9–10.3)
Chloride: 99 mmol/L (ref 98–111)
Creatinine: 0.86 mg/dL (ref 0.44–1.00)
GFR, Estimated: >60 mL/min (ref >60)
Glucose, Bld: 111 mg/dL — ABNORMAL HIGH (ref 70–99)
Potassium: 3.4 mmol/L — ABNORMAL LOW (ref 3.5–5.1)
Sodium: 136 mmol/L (ref 135–145)
Total Bilirubin: 0.7 mg/dL (ref 0.3–1.2)
Total Protein: 6.8 g/dL (ref 6.5–8.1)

## 2020-01-26 MED ORDER — PROCHLORPERAZINE MALEATE 10 MG PO TABS
10.0000 mg | ORAL_TABLET | Freq: Four times a day (QID) | ORAL | 3 refills | Status: DC | PRN
Start: 2020-01-26 — End: 2021-01-11

## 2020-01-26 MED ORDER — ONDANSETRON HCL 4 MG PO TABS
4.0000 mg | ORAL_TABLET | ORAL | 3 refills | Status: DC | PRN
Start: 2020-01-26 — End: 2021-01-11

## 2020-01-26 NOTE — Patient Instructions (Signed)
Carboplatin injection What is this medicine? CARBOPLATIN (KAR boe pla tin) is a chemotherapy drug. It targets fast dividing cells, like cancer cells, and causes these cells to die. This medicine is used to treat ovarian cancer and many other cancers. This medicine may be used for other purposes; ask your health care provider or pharmacist if you have questions. COMMON BRAND NAME(S): Paraplatin What should I tell my health care provider before I take this medicine? They need to know if you have any of these conditions:  blood disorders  hearing problems  kidney disease  recent or ongoing radiation therapy  an unusual or allergic reaction to carboplatin, cisplatin, other chemotherapy, other medicines, foods, dyes, or preservatives  pregnant or trying to get pregnant  breast-feeding How should I use this medicine? This drug is usually given as an infusion into a vein. It is administered in a hospital or clinic by a specially trained health care professional. Talk to your pediatrician regarding the use of this medicine in children. Special care may be needed. Overdosage: If you think you have taken too much of this medicine contact a poison control center or emergency room at once. NOTE: This medicine is only for you. Do not share this medicine with others. What if I miss a dose? It is important not to miss a dose. Call your doctor or health care professional if you are unable to keep an appointment. What may interact with this medicine?  medicines for seizures  medicines to increase blood counts like filgrastim, pegfilgrastim, sargramostim  some antibiotics like amikacin, gentamicin, neomycin, streptomycin, tobramycin  vaccines Talk to your doctor or health care professional before taking any of these medicines:  acetaminophen  aspirin  ibuprofen  ketoprofen  naproxen This list may not describe all possible interactions. Give your health care provider a list of all the  medicines, herbs, non-prescription drugs, or dietary supplements you use. Also tell them if you smoke, drink alcohol, or use illegal drugs. Some items may interact with your medicine. What should I watch for while using this medicine? Your condition will be monitored carefully while you are receiving this medicine. You will need important blood work done while you are taking this medicine. This drug may make you feel generally unwell. This is not uncommon, as chemotherapy can affect healthy cells as well as cancer cells. Report any side effects. Continue your course of treatment even though you feel ill unless your doctor tells you to stop. In some cases, you may be given additional medicines to help with side effects. Follow all directions for their use. Call your doctor or health care professional for advice if you get a fever, chills or sore throat, or other symptoms of a cold or flu. Do not treat yourself. This drug decreases your body's ability to fight infections. Try to avoid being around people who are sick. This medicine may increase your risk to bruise or bleed. Call your doctor or health care professional if you notice any unusual bleeding. Be careful brushing and flossing your teeth or using a toothpick because you may get an infection or bleed more easily. If you have any dental work done, tell your dentist you are receiving this medicine. Avoid taking products that contain aspirin, acetaminophen, ibuprofen, naproxen, or ketoprofen unless instructed by your doctor. These medicines may hide a fever. Do not become pregnant while taking this medicine. Women should inform their doctor if they wish to become pregnant or think they might be pregnant. There is a  potential for serious side effects to an unborn child. Talk to your health care professional or pharmacist for more information. Do not breast-feed an infant while taking this medicine. What side effects may I notice from receiving this  medicine? Side effects that you should report to your doctor or health care professional as soon as possible:  allergic reactions like skin rash, itching or hives, swelling of the face, lips, or tongue  signs of infection - fever or chills, cough, sore throat, pain or difficulty passing urine  signs of decreased platelets or bleeding - bruising, pinpoint red spots on the skin, black, tarry stools, nosebleeds  signs of decreased red blood cells - unusually weak or tired, fainting spells, lightheadedness  breathing problems  changes in hearing  changes in vision  chest pain  high blood pressure  low blood counts - This drug may decrease the number of white blood cells, red blood cells and platelets. You may be at increased risk for infections and bleeding.  nausea and vomiting  pain, swelling, redness or irritation at the injection site  pain, tingling, numbness in the hands or feet  problems with balance, talking, walking  trouble passing urine or change in the amount of urine Side effects that usually do not require medical attention (report to your doctor or health care professional if they continue or are bothersome):  hair loss  loss of appetite  metallic taste in the mouth or changes in taste This list may not describe all possible side effects. Call your doctor for medical advice about side effects. You may report side effects to FDA at 1-800-FDA-1088. Where should I keep my medicine? This drug is given in a hospital or clinic and will not be stored at home. NOTE: This sheet is a summary. It may not cover all possible information. If you have questions about this medicine, talk to your doctor, pharmacist, or health care provider.  2020 Elsevier/Gold Standard (2007-06-16 14:38:05) Etoposide, VP-16 injection What is this medicine? ETOPOSIDE, VP-16 (e toe POE side) is a chemotherapy drug. It is used to treat testicular cancer, lung cancer, and other cancers. This  medicine may be used for other purposes; ask your health care provider or pharmacist if you have questions. COMMON BRAND NAME(S): Etopophos, Toposar, VePesid What should I tell my health care provider before I take this medicine? They need to know if you have any of these conditions:  infection  kidney disease  liver disease  low blood counts, like low white cell, platelet, or red cell counts  an unusual or allergic reaction to etoposide, other medicines, foods, dyes, or preservatives  pregnant or trying to get pregnant  breast-feeding How should I use this medicine? This medicine is for infusion into a vein. It is administered in a hospital or clinic by a specially trained health care professional. Talk to your pediatrician regarding the use of this medicine in children. Special care may be needed. Overdosage: If you think you have taken too much of this medicine contact a poison control center or emergency room at once. NOTE: This medicine is only for you. Do not share this medicine with others. What if I miss a dose? It is important not to miss your dose. Call your doctor or health care professional if you are unable to keep an appointment. What may interact with this medicine? This medicine may interact with the following medications:  warfarin This list may not describe all possible interactions. Give your health care provider a list  of all the medicines, herbs, non-prescription drugs, or dietary supplements you use. Also tell them if you smoke, drink alcohol, or use illegal drugs. Some items may interact with your medicine. What should I watch for while using this medicine? Visit your doctor for checks on your progress. This drug may make you feel generally unwell. This is not uncommon, as chemotherapy can affect healthy cells as well as cancer cells. Report any side effects. Continue your course of treatment even though you feel ill unless your doctor tells you to stop. In some  cases, you may be given additional medicines to help with side effects. Follow all directions for their use. Call your doctor or health care professional for advice if you get a fever, chills or sore throat, or other symptoms of a cold or flu. Do not treat yourself. This drug decreases your body's ability to fight infections. Try to avoid being around people who are sick. This medicine may increase your risk to bruise or bleed. Call your doctor or health care professional if you notice any unusual bleeding. Talk to your doctor about your risk of cancer. You may be more at risk for certain types of cancers if you take this medicine. Do not become pregnant while taking this medicine or for at least 6 months after stopping it. Women should inform their doctor if they wish to become pregnant or think they might be pregnant. Women of child-bearing potential will need to have a negative pregnancy test before starting this medicine. There is a potential for serious side effects to an unborn child. Talk to your health care professional or pharmacist for more information. Do not breast-feed an infant while taking this medicine. Men must use a latex condom during sexual contact with a woman while taking this medicine and for at least 4 months after stopping it. A latex condom is needed even if you have had a vasectomy. Contact your doctor right away if your partner becomes pregnant. Do not donate sperm while taking this medicine and for at least 4 months after you stop taking this medicine. Men should inform their doctors if they wish to father a child. This medicine may lower sperm counts. What side effects may I notice from receiving this medicine? Side effects that you should report to your doctor or health care professional as soon as possible:  allergic reactions like skin rash, itching or hives, swelling of the face, lips, or tongue  low blood counts - this medicine may decrease the number of white blood  cells, red blood cells, and platelets. You may be at increased risk for infections and bleeding  nausea, vomiting  redness, blistering, peeling or loosening of the skin, including inside the mouth  signs and symptoms of infection like fever; chills; cough; sore throat; pain or trouble passing urine  signs and symptoms of low red blood cells or anemia such as unusually weak or tired; feeling faint or lightheaded; falls; breathing problems  unusual bruising or bleeding Side effects that usually do not require medical attention (report to your doctor or health care professional if they continue or are bothersome):  changes in taste  diarrhea  hair loss  loss of appetite  mouth sores This list may not describe all possible side effects. Call your doctor for medical advice about side effects. You may report side effects to FDA at 1-800-FDA-1088. Where should I keep my medicine? This drug is given in a hospital or clinic and will not be stored at home.  NOTE: This sheet is a summary. It may not cover all possible information. If you have questions about this medicine, talk to your doctor, pharmacist, or health care provider.  2020 Elsevier/Gold Standard (2018-05-06 16:57:15)  Chemotherapy Chemotherapy is a cancer treatment. It uses medicines to slow down or stop the growth of cancer. You may have chemotherapy to:  Cure your cancer.  Prevent the cancer from growing or spreading (metastasizing).  Ease symptoms and improve your quality of life (palliative care).  Improve the effects of radiation treatment.  Shrink a tumor before surgery.  Rid the body of cancer cells that remain after having a tumor surgically removed. The length of chemotherapy treatment depends on many factors, including:  The type and stage of your cancer.  How you respond to the chemotherapy.  Your side effects. What are the risks? Generally, this is a safe treatment. However, problems may occur,  including:  Infection.  Bleeding at the IV site.  Allergic reactions to medicines. You may have side effects from chemotherapy. What side effects you have depends on a variety of factors, including:  The type of chemotherapy medicine used.  Your dosage.  How long the medicine is used for.  Your overall health. What happens before treatment?  You will meet with your cancer care team to discuss: ? How your chemotherapy medicine will be given. ? Common side effects and how to manage them. ? Your treatment schedule.  You may have blood tests.  You may be given medicine to help prevent common side effects. What happens during treatment? Chemotherapy may be given continuously over time, or it may be given in cycles. Some common ways chemotherapy may be given include:  As a pill or capsule.  As an injection.  As a skin (topical) cream.  As a special wafer that is put in your body where the cancer is.  As an injection into the cerebrospinal fluid (CSF) in the brain or spinal cord (intraventricular or intrathecal chemotherapy).  Through a small, thin tube (catheter). There are different kinds of catheters. You might have one that: ? Goes into a vein (intravenous catheter). ? Connects to a device (port) that is inserted under the skin of your chest (port catheter). A port catheter connects the port to a large vein in your chest or upper arm. The port may stay in place for many weeks or months. ? Goes into a vein near your elbow (PICC line). This may be used for weeks or months. ? Goes into a vein in your neck that leads to your heart (non-tunneled catheter). This catheter has a risk of infection, so it is used for only a short time. ? Goes through the skin of your chest and into a large vein that leads to your heart (tunneled catheter). This catheter can stay in your body for months or years. While you are receiving your medicine, your cancer care team will monitor your blood  pressure, heart rate, breathing rate, and blood oxygen level (vital signs) and watch for any problems. Some types of chemotherapy medicine are given only one time. Others are given for months, years, or for life. What can I expect after treatment? After chemotherapy, you may have side effects, such as:  Nausea and vomiting.  Appetite loss.  Constipation or diarrhea.  Fatigue.  Increased risk of infections, bruising, or bleeding.  Hair loss.  Mouth or throat sores.  Tingling, pain, or numbness in the hands and feet.  Dry, sensitive, itchy, or sore skin.  Memory changes. Follow these instructions at home: General instructions   If you get chemotherapy through an IV, PICC line, or port, check the site every day for signs of infection. Check for redness, swelling, pain, fluid, or warmth.  Wash your hands frequently with soap and water. If soap and water are not available, use hand sanitizer. Have other members of your household wash their hands often.  Chemotherapy medicines leave the body through urine or stool (feces), but they can also be present in other body fluids including vomit, tears, vaginal fluids, and semen. You must carefully follow some safety precautions to prevent harm to others while you are taking these medicines: ? Wash any clothes, towels, and linens that may have your bodily fluids on them twice in a washing machine. ? Use a condom during vaginal, anal, and oral sex while you are taking chemotherapy medicines and for 48 hours after your last dose. ? Practice good bathroom hygiene:  Always sit when using the toilet. Close the toilet seat lid before you flush.  Wash your hands thoroughly with soap and water after each time you use the toilet.  Keep all follow-up visits as told by your cancer care team. This is important. Eating and drinking  Talk with a dietitian about what you should eat and drink during cancer treatment.  Always wash fresh fruits and  vegetables well before eating them.  Drink enough fluid to keep urine pale yellow. Medicines  Take over-the-counter and prescription medicines only as told by your health care provider.  Talk with your health care provider before taking vitamins and supplements. Some can interfere with chemotherapy. Activity  Get plenty of rest.  Get regular exercise such as walking, gentle yoga, or tai chi.  Return to your normal activities as told by your health care provider. Ask your health care provider what activities are safe for you. Contact a health care provider if:  You have: ? A skin rash that does not go away. ? A headache. ? A stiff neck. ? A cough. ? Cold or flu symptoms. ? A burning feeling when urinating. ? Urine that smells bad. ? Diarrhea. ? Nausea. ? Vomiting. ? Blood in your urine or stool.  You bleed or bruise often.  You urinate more frequently than usual.  You cannot eat because of mouth or throat pain. Get help right away if:  You have: ? A fever. ? Redness, swelling, pain, fluid, or warmth near your IV site. ? Bleeding that does not stop. ? A seizure. ? Chest pain. ? Difficulty breathing.  You cannot swallow. Summary  Chemotherapy is a way to treat cancer. It uses medicines to slow down or stop the growth of cancer.  Before treatment, you and your cancer care team will discuss common side effects and how to manage them.  The way that you will get chemotherapy medicines depends on your condition.  Take over-the-counter and prescription medicines only as told by your health care provider. This information is not intended to replace advice given to you by your health care provider. Make sure you discuss any questions you have with your health care provider. Document Revised: 07/01/2018 Document Reviewed: 12/26/2016 Elsevier Patient Education  2020 [employer].  Managing Chemotherapy Side Effects, Adult Chemotherapy is a treatment that uses medicine  to kill cancer cells. Chemotherapy causes side effects. The specific side effects depend on the specific medicines used. Most of the side effects of chemotherapy go away once treatment is finished. Until then, work  closely with your health care providers and take an active role in managing your side effects. What are common side effects?  Tiredness (fatigue).  Increased risk of infections, bruising, or bleeding.  Nausea and vomiting.  Constipation or diarrhea.  Appetite loss.  Hair loss.  Mouth or throat sores.  Tingling, pain, or numbness in the hands and feet.  Dry, sensitive, itchy, or sore skin.  Confusion, anxiety, or mood swings.  Memory changes. How can I help manage my side effects? Medicines  Take over-the-counter and prescription medicines only as told by your health care provider.  Talk with your health care provider before taking vitamins, supplements, and over-the-counter medicines. Some of these can interfere with chemotherapy. Activity   Get plenty of rest.  Get regular exercise by doing activities such as walking, gentle yoga, or tai chi.  Return to your normal activities as told by your health care provider. Ask your health care provider what activities are safe for you. Eating and drinking  Talk to a dietitian about what you should eat and drink during cancer treatment.  Drink enough fluid to keep your urine pale yellow.  If you have side effects that affect eating, these tips may help: ? Eat smaller meals and snacks often. ? Drink high-nutrition and high-calorie shakes or supplements. ? Eat bland and soft foods that are easy to eat. ? Do not eat foods that are hot, spicy, or hard to swallow.  Do not eat raw or undercooked meat, eggs, or seafood.  Always wash fresh fruits and vegetables well before eating them. General instructions  Learn as much as you can about your condition.  Keep all follow-up visits as told by your health care  provider. This is important.  Use mouth rinse only as told by your health care provider.  Protect your skin from the sun by using sunscreen or wearing protective clothing and a hat.  If you have sore or itchy skin: ? Wear soft, comfortable clothing. ? Apply creams and ointments to your skin as told by your health care provider.  If you lose your hair, wear a wig, hat, or scarf to cover your head. You may want to have someone shave your head as you start to lose hair.  Meet with a hair and skin care specialist for makeup and skin care tips. How can I prevent infection and bleeding? Chemotherapy may lower your blood counts and put you at risk for infection and bleeding. Here are some ways to help prevent problems. Vaccines  Talk to your health care provider about vaccines. You should not get any live vaccines, such as the polio, MMR, chicken pox, and shingles vaccines. Do not be around people who have had live vaccines.  Make sure you get a yearly flu shot. People who will be near you should also get a yearly flu shot. Social activity  Stay away from crowded places where you could be exposed to germs.  Do not be around people who may be sick.  Do not share food or utensils with other people.  Wear a mask when outside the home if your blood counts are low. Cleanliness   Wash your hands often. Also make sure that other members of your household wash their hands often.  Brush your teeth daily using a soft toothbrush. General tips  Take your temperature regularly, especially if you have chills or feel warm.  Check with your health care provider before you: ? Travel. ? Have a dental procedure.  If  you get chemotherapy through an IV or port, check the site every day for signs of infection. Check for redness, swelling, pain, fluid, and warmth.  Avoid activities that put you at risk for injury.  Use an electric razor to shave instead of a blade. Questions to ask your health care  provider  What are the most common side effects of my treatment?  How will they affect my daily life?  What can I do to manage them?  When can I expect them to end?  What are some possible long-term side effects?  What are possible complications?  What support services are available?  When should I contact my cancer care provider?  What number can I call with questions or concerns? Where to find support Cancer affects the entire family. Find out what family support resources are available from your cancer treatment center. For more support, turn to:  Your cancer care team.  Friends and family.  Your religious community.  Other people with cancer.  Online support groups. Where to find more information  [place]: www.cancer.gov  [employer]: www.cancer.org Contact a health care provider if:  You bleed or bruise often.  You have: ? A skin rash or dry or itchy skin. ? A headache or stiff neck. ? Cold or flu symptoms. ? A cough. ? Nausea or vomiting. ? Diarrhea. ? Frequent urination, burning when passing urine, or foul-smelling urine. ? Blood in your urine or stool.  You cannot eat because of mouth or throat pain.  You are sad, confused, anxious, or depressed. Get help right away if:  You have: ? A fever. ? Redness, swelling, pain, fluid, or warmth near an IV site. ? Bleeding that you cannot stop. ? A seizure.  You cannot swallow.  You have chest pain.  You have trouble breathing. Summary  Chemotherapy is a treatment that uses medicine to kill cancer cells. Chemotherapy causes side effects. The specific side effects depend on the specific medicines used.  Learn as much as you can about your condition. Ask about side effects to watch for and how to treat them.  Seek out support and resources from others. Find out what family support resources are available from your cancer treatment center.  Let your health care provider  know if you notice any new or unusual symptoms. This information is not intended to replace advice given to you by your health care provider. Make sure you discuss any questions you have with your health care provider. Document Revised: 06/05/2017 Document Reviewed: 06/05/2017 Elsevier Patient Education  2020 [employer].  Preparing for Chemotherapy, Adult Chemotherapy is a way to treat cancer. It uses medicines to slow down or stop the growth of cancer. Depending on the type and stage of your cancer, you may have chemotherapy to:  Cure the cancer.  Prevent the cancer from growing or spreading (metastasizing).  Ease symptoms and improve your quality of life (palliative care).  Improve the effects of radiation treatment.  Shrink a tumor before surgery.  Rid your body of cancer cells that remain after a tumor is surgically removed. How is chemotherapy given? Chemotherapy may be given in many ways. Some common ways include:  As a pill or capsule.  By a shot (injection).  As a skin (topical) cream.  As a wafer that is put in your body where the cancer is. The wafer has chemotherapy medicine in it.  As an injection into the cerebrospinal fluid (CSF) in the brain or spinal  cord (intraventricular or intrathecal chemotherapy).  Through a small, thin tube (catheter). There are different kinds of catheters. You might have one that: ? Goes into a vein (intravenous catheter). An IV may be inserted into a vein each time you get a treatment. ? Connects to a port that is inserted under the skin of your chest. A port catheter connects the port to a large vein in your chest or upper arm. A needle is inserted through your skin into the port during each treatment. The port may be kept in place for many weeks or months. ? Goes into a vein near your elbow (PICC line). It may be used for weeks or months. ? Goes into a vein near your neck that leads to your heart (non-tunneled catheter). This catheter  has a risk of infection, so it is used for only a short time. ? Goes through the skin of your chest and into a large vein that leads to your heart (tunneled catheter). It can be kept in your body for months or years. The way that you will get chemotherapy medicines depends on your condition. What can I do to prepare for the treatment? Do these things so you feel ready for your treatment:  Find out as much as you can about your treatment. Ask your health care provider for trusted sources (Internet sites, books, videos, and people) to help you learn about the treatment that you will be having.  Talk with your health care provider about all medicines, vitamins, and supplements that you take. Some vitamins and supplements should not be taken during chemotherapy because they may interfere with the treatment.  Ask your health care provider what to expect. Write down any questions you have and keep track of the answers.  If you work, ask how the treatment will affect your ability to do your job. If needed, meet with your manager to discuss a modified work schedule or time away from work during your chemotherapy treatment.  Meet with a Education officer, museum or patient navigator to discuss any financial concerns you have about the cost of your chemotherapy medicine. Ask about resources such as patient assistance programs and grants to help with other related costs.  Learn about eating well during treatment. Chemotherapy can change what you want to eat and how your body feels. Choose nutritious foods and drinks that taste good to you. It might help to talk to a nutrition specialist (dietitian) about what to eat to stay healthy.  If you are concerned about how the chemotherapy treatment may affect your ability to have children (fertility), ask to meet with a specialist prior to starting treatment to discuss ways to preserve your fertility.  You might lose some hair because of chemotherapy. Decide how you want to  manage this. Find a wig, scarf, or hat that you like. Think about shaving your head before treatment starts.  Go to your dentist for a checkup before treatment starts.  If you have a port, a PICC line, or a long-term catheter, learn how to take care of it.  Pack things to bring with you to the treatment center, like a snack, movies, music, books, or a smartphone or tablet. What questions should I ask my health care provider? Ask your health care provider these questions:  Which kind of chemotherapy would be best for me? Why?  How often will I have treatment?  How does the treatment work?  Does the clinic or hospital have support groups for adults with cancer?  Can I do all my usual activities? What activities are off-limits?  What are the most common side effects?  Are there any long-lasting or permanent side effects?  Will this treatment affect my fertility?  Whom do I call if I have problems after a treatment? Summary  Chemotherapy is a way to treat cancer. It uses medicines to slow down or stop the growth of cancer.  The goal of chemotherapy depends on the type and stage of your cancer.  Chemotherapy may be given in many ways. The most common ways are by IV, by a shot (injection), or in a pill form.  Find out as much as you can about your treatment. Ask your health care provider for trusted sources (Internet sites, books, videos, and people) to help you learn about the treatment you will be having.  Before you start chemotherapy, ask your health care provider any questions you have about your treatment. This information is not intended to replace advice given to you by your health care provider. Make sure you discuss any questions you have with your health care provider. Document Revised: 02/21/2017 Document Reviewed: 11/22/2016 Elsevier Patient Education  [place]. Pegfilgrastim injection What is this medicine? PEGFILGRASTIM (PEG fil gra stim) is a long-acting  granulocyte colony-stimulating factor that stimulates the growth of neutrophils, a type of white blood cell important in the body's fight against infection. It is used to reduce the incidence of fever and infection in patients with certain types of cancer who are receiving chemotherapy that affects the bone marrow, and to increase survival after being exposed to high doses of radiation. This medicine may be used for other purposes; ask your health care provider or pharmacist if you have questions. COMMON BRAND NAME(S): Steve Rattler, Ziextenzo What should I tell my health care provider before I take this medicine? They need to know if you have any of these conditions:  kidney disease  latex allergy  ongoing radiation therapy  sickle cell disease  skin reactions to acrylic adhesives (On-Body Injector only)  an unusual or allergic reaction to pegfilgrastim, filgrastim, other medicines, foods, dyes, or preservatives  pregnant or trying to get pregnant  breast-feeding How should I use this medicine? This medicine is for injection under the skin. If you get this medicine at home, you will be taught how to prepare and give the pre-filled syringe or how to use the On-body Injector. Refer to the patient Instructions for Use for detailed instructions. Use exactly as directed. Tell your healthcare provider immediately if you suspect that the On-body Injector may not have performed as intended or if you suspect the use of the On-body Injector resulted in a missed or partial dose. It is important that you put your used needles and syringes in a special sharps container. Do not put them in a trash can. If you do not have a sharps container, call your pharmacist or healthcare provider to get one. Talk to your pediatrician regarding the use of this medicine in children. While this drug may be prescribed for selected conditions, precautions do apply. Overdosage: If you think you have taken too  much of this medicine contact a poison control center or emergency room at once. NOTE: This medicine is only for you. Do not share this medicine with others. What if I miss a dose? It is important not to miss your dose. Call your doctor or health care professional if you miss your dose. If you miss a dose due to an On-body  Injector failure or leakage, a new dose should be administered as soon as possible using a single prefilled syringe for manual use. What may interact with this medicine? Interactions have not been studied. Give your health care provider a list of all the medicines, herbs, non-prescription drugs, or dietary supplements you use. Also tell them if you smoke, drink alcohol, or use illegal drugs. Some items may interact with your medicine. This list may not describe all possible interactions. Give your health care provider a list of all the medicines, herbs, non-prescription drugs, or dietary supplements you use. Also tell them if you smoke, drink alcohol, or use illegal drugs. Some items may interact with your medicine. What should I watch for while using this medicine? You may need blood work done while you are taking this medicine. If you are going to need a MRI, CT scan, or other procedure, tell your doctor that you are using this medicine (On-Body Injector only). What side effects may I notice from receiving this medicine? Side effects that you should report to your doctor or health care professional as soon as possible:  allergic reactions like skin rash, itching or hives, swelling of the face, lips, or tongue  back pain  dizziness  fever  pain, redness, or irritation at site where injected  pinpoint red spots on the skin  red or dark-brown urine  shortness of breath or breathing problems  stomach or side pain, or pain at the shoulder  swelling  tiredness  trouble passing urine or change in the amount of urine Side effects that usually do not require medical  attention (report to your doctor or health care professional if they continue or are bothersome):  bone pain  muscle pain This list may not describe all possible side effects. Call your doctor for medical advice about side effects. You may report side effects to FDA at 1-800-FDA-1088. Where should I keep my medicine? Keep out of the reach of children. If you are using this medicine at home, you will be instructed on how to store it. Throw away any unused medicine after the expiration date on the label. NOTE: This sheet is a summary. It may not cover all possible information. If you have questions about this medicine, talk to your doctor, pharmacist, or health care provider.  2020 Elsevier/Gold Standard (2017-06-16 16:57:08)

## 2020-01-27 ENCOUNTER — Ambulatory Visit: Payer: Medicare Other

## 2020-01-27 ENCOUNTER — Inpatient Hospital Stay: Payer: Medicare Other

## 2020-01-28 ENCOUNTER — Ambulatory Visit: Payer: Medicare Other

## 2020-01-28 ENCOUNTER — Other Ambulatory Visit: Payer: Medicare Other | Admitting: Hematology and Oncology

## 2020-01-28 ENCOUNTER — Telehealth: Payer: Self-pay | Admitting: Hematology and Oncology

## 2020-01-28 DIAGNOSIS — I110 Hypertensive heart disease with heart failure: Secondary | ICD-10-CM | POA: Diagnosis not present

## 2020-01-28 DIAGNOSIS — M1990 Unspecified osteoarthritis, unspecified site: Secondary | ICD-10-CM | POA: Diagnosis not present

## 2020-01-28 DIAGNOSIS — J9611 Chronic respiratory failure with hypoxia: Secondary | ICD-10-CM | POA: Diagnosis not present

## 2020-01-28 DIAGNOSIS — J449 Chronic obstructive pulmonary disease, unspecified: Secondary | ICD-10-CM | POA: Diagnosis not present

## 2020-01-28 DIAGNOSIS — K219 Gastro-esophageal reflux disease without esophagitis: Secondary | ICD-10-CM | POA: Diagnosis not present

## 2020-01-28 DIAGNOSIS — F17210 Nicotine dependence, cigarettes, uncomplicated: Secondary | ICD-10-CM | POA: Diagnosis not present

## 2020-01-28 DIAGNOSIS — I517 Cardiomegaly: Secondary | ICD-10-CM | POA: Diagnosis not present

## 2020-01-28 DIAGNOSIS — Z87891 Personal history of nicotine dependence: Secondary | ICD-10-CM | POA: Diagnosis not present

## 2020-01-28 DIAGNOSIS — E039 Hypothyroidism, unspecified: Secondary | ICD-10-CM | POA: Diagnosis not present

## 2020-01-28 DIAGNOSIS — C3491 Malignant neoplasm of unspecified part of right bronchus or lung: Secondary | ICD-10-CM | POA: Diagnosis not present

## 2020-01-28 DIAGNOSIS — I509 Heart failure, unspecified: Secondary | ICD-10-CM | POA: Diagnosis not present

## 2020-01-28 DIAGNOSIS — J90 Pleural effusion, not elsewhere classified: Secondary | ICD-10-CM | POA: Diagnosis not present

## 2020-01-28 DIAGNOSIS — Z9981 Dependence on supplemental oxygen: Secondary | ICD-10-CM | POA: Diagnosis not present

## 2020-01-28 DIAGNOSIS — C3490 Malignant neoplasm of unspecified part of unspecified bronchus or lung: Secondary | ICD-10-CM | POA: Diagnosis not present

## 2020-01-28 DIAGNOSIS — Z79899 Other long term (current) drug therapy: Secondary | ICD-10-CM | POA: Diagnosis not present

## 2020-01-28 DIAGNOSIS — J9811 Atelectasis: Secondary | ICD-10-CM | POA: Diagnosis not present

## 2020-01-28 NOTE — Telephone Encounter (Signed)
No 11/3 LOS.No changes made to patient schedule.

## 2020-01-30 DIAGNOSIS — J449 Chronic obstructive pulmonary disease, unspecified: Secondary | ICD-10-CM | POA: Diagnosis not present

## 2020-01-31 ENCOUNTER — Inpatient Hospital Stay: Payer: Medicare Other

## 2020-01-31 ENCOUNTER — Ambulatory Visit: Payer: Medicare Other

## 2020-01-31 ENCOUNTER — Other Ambulatory Visit: Payer: Self-pay

## 2020-01-31 VITALS — BP 145/71 | HR 96 | Temp 97.5°F | Resp 20 | Ht 63.0 in | Wt 229.0 lb

## 2020-01-31 DIAGNOSIS — D649 Anemia, unspecified: Secondary | ICD-10-CM | POA: Diagnosis not present

## 2020-01-31 DIAGNOSIS — C3411 Malignant neoplasm of upper lobe, right bronchus or lung: Secondary | ICD-10-CM | POA: Diagnosis not present

## 2020-01-31 DIAGNOSIS — D241 Benign neoplasm of right breast: Secondary | ICD-10-CM | POA: Diagnosis not present

## 2020-01-31 DIAGNOSIS — E871 Hypo-osmolality and hyponatremia: Secondary | ICD-10-CM

## 2020-01-31 DIAGNOSIS — C3490 Malignant neoplasm of unspecified part of unspecified bronchus or lung: Secondary | ICD-10-CM

## 2020-01-31 DIAGNOSIS — Z85038 Personal history of other malignant neoplasm of large intestine: Secondary | ICD-10-CM | POA: Diagnosis not present

## 2020-01-31 DIAGNOSIS — E876 Hypokalemia: Secondary | ICD-10-CM | POA: Diagnosis not present

## 2020-01-31 DIAGNOSIS — J449 Chronic obstructive pulmonary disease, unspecified: Secondary | ICD-10-CM | POA: Diagnosis not present

## 2020-01-31 DIAGNOSIS — C3430 Malignant neoplasm of lower lobe, unspecified bronchus or lung: Secondary | ICD-10-CM

## 2020-01-31 DIAGNOSIS — Z5111 Encounter for antineoplastic chemotherapy: Secondary | ICD-10-CM | POA: Diagnosis not present

## 2020-01-31 DIAGNOSIS — E222 Syndrome of inappropriate secretion of antidiuretic hormone: Secondary | ICD-10-CM

## 2020-01-31 MED ORDER — HEPARIN SOD (PORK) LOCK FLUSH 100 UNIT/ML IV SOLN
500.0000 [IU] | Freq: Once | INTRAVENOUS | Status: AC | PRN
  Administered 2020-01-31: 500 [IU]
  Filled 2020-01-31: qty 5

## 2020-01-31 MED ORDER — PALONOSETRON HCL INJECTION 0.25 MG/5ML
INTRAVENOUS | Status: AC
  Filled 2020-01-31: qty 5

## 2020-01-31 MED ORDER — FAMOTIDINE IN NACL 20-0.9 MG/50ML-% IV SOLN
INTRAVENOUS | Status: AC
  Filled 2020-01-31: qty 50

## 2020-01-31 MED ORDER — SODIUM CHLORIDE 0.9 % IV SOLN
150.0000 mg | Freq: Once | INTRAVENOUS | Status: AC
  Administered 2020-01-31: 150 mg via INTRAVENOUS
  Filled 2020-01-31: qty 150

## 2020-01-31 MED ORDER — SODIUM CHLORIDE 0.9 % IV SOLN
10.0000 mg | Freq: Once | INTRAVENOUS | Status: AC
  Administered 2020-01-31: 10 mg via INTRAVENOUS
  Filled 2020-01-31: qty 10

## 2020-01-31 MED ORDER — DIPHENHYDRAMINE HCL 50 MG/ML IJ SOLN
50.0000 mg | Freq: Once | INTRAMUSCULAR | Status: AC
  Administered 2020-01-31: 50 mg via INTRAVENOUS

## 2020-01-31 MED ORDER — SODIUM CHLORIDE 0.9% FLUSH
10.0000 mL | INTRAVENOUS | Status: DC | PRN
  Administered 2020-01-31: 10 mL
  Filled 2020-01-31: qty 10

## 2020-01-31 MED ORDER — PALONOSETRON HCL INJECTION 0.25 MG/5ML
0.2500 mg | Freq: Once | INTRAVENOUS | Status: AC
  Administered 2020-01-31: 0.25 mg via INTRAVENOUS

## 2020-01-31 MED ORDER — SODIUM CHLORIDE 0.9 % IV SOLN
100.0000 mg/m2 | Freq: Once | INTRAVENOUS | Status: AC
  Administered 2020-01-31: 210 mg via INTRAVENOUS
  Filled 2020-01-31: qty 10.5

## 2020-01-31 MED ORDER — FAMOTIDINE IN NACL 20-0.9 MG/50ML-% IV SOLN
20.0000 mg | Freq: Once | INTRAVENOUS | Status: AC
  Administered 2020-01-31: 20 mg via INTRAVENOUS

## 2020-01-31 MED ORDER — SODIUM CHLORIDE 0.9 % IV SOLN
504.0000 mg | Freq: Once | INTRAVENOUS | Status: AC
  Administered 2020-01-31: 500 mg via INTRAVENOUS
  Filled 2020-01-31: qty 50

## 2020-01-31 MED ORDER — SODIUM CHLORIDE 0.9 % IV SOLN
Freq: Once | INTRAVENOUS | Status: AC
  Filled 2020-01-31: qty 250

## 2020-01-31 MED ORDER — DIPHENHYDRAMINE HCL 50 MG/ML IJ SOLN
INTRAMUSCULAR | Status: AC
  Filled 2020-01-31: qty 1

## 2020-01-31 NOTE — Progress Notes (Signed)
PT STABLE AT TIME OF DISCHARGE 

## 2020-01-31 NOTE — Patient Instructions (Signed)
Etoposide, VP-16 injection What is this medicine? ETOPOSIDE, VP-16 (e toe POE side) is a chemotherapy drug. It is used to treat testicular cancer, lung cancer, and other cancers. This medicine may be used for other purposes; ask your health care provider or pharmacist if you have questions. COMMON BRAND NAME(S): Etopophos, Toposar, VePesid What should I tell my health care provider before I take this medicine? They need to know if you have any of these conditions:  infection  kidney disease  liver disease  low blood counts, like low white cell, platelet, or red cell counts  an unusual or allergic reaction to etoposide, other medicines, foods, dyes, or preservatives  pregnant or trying to get pregnant  breast-feeding How should I use this medicine? This medicine is for infusion into a vein. It is administered in a hospital or clinic by a specially trained health care professional. Talk to your pediatrician regarding the use of this medicine in children. Special care may be needed. Overdosage: If you think you have taken too much of this medicine contact a poison control center or emergency room at once. NOTE: This medicine is only for you. Do not share this medicine with others. What if I miss a dose? It is important not to miss your dose. Call your doctor or health care professional if you are unable to keep an appointment. What may interact with this medicine? This medicine may interact with the following medications:  warfarin This list may not describe all possible interactions. Give your health care provider a list of all the medicines, herbs, non-prescription drugs, or dietary supplements you use. Also tell them if you smoke, drink alcohol, or use illegal drugs. Some items may interact with your medicine. What should I watch for while using this medicine? Visit your doctor for checks on your progress. This drug may make you feel generally unwell. This is not uncommon, as  chemotherapy can affect healthy cells as well as cancer cells. Report any side effects. Continue your course of treatment even though you feel ill unless your doctor tells you to stop. In some cases, you may be given additional medicines to help with side effects. Follow all directions for their use. Call your doctor or health care professional for advice if you get a fever, chills or sore throat, or other symptoms of a cold or flu. Do not treat yourself. This drug decreases your body's ability to fight infections. Try to avoid being around people who are sick. This medicine may increase your risk to bruise or bleed. Call your doctor or health care professional if you notice any unusual bleeding. Talk to your doctor about your risk of cancer. You may be more at risk for certain types of cancers if you take this medicine. Do not become pregnant while taking this medicine or for at least 6 months after stopping it. Women should inform their doctor if they wish to become pregnant or think they might be pregnant. Women of child-bearing potential will need to have a negative pregnancy test before starting this medicine. There is a potential for serious side effects to an unborn child. Talk to your health care professional or pharmacist for more information. Do not breast-feed an infant while taking this medicine. Men must use a latex condom during sexual contact with a woman while taking this medicine and for at least 4 months after stopping it. A latex condom is needed even if you have had a vasectomy. Contact your doctor right away if your partner  becomes pregnant. Do not donate sperm while taking this medicine and for at least 4 months after you stop taking this medicine. Men should inform their doctors if they wish to father a child. This medicine may lower sperm counts. What side effects may I notice from receiving this medicine? Side effects that you should report to your doctor or health care professional  as soon as possible:  allergic reactions like skin rash, itching or hives, swelling of the face, lips, or tongue  low blood counts - this medicine may decrease the number of white blood cells, red blood cells, and platelets. You may be at increased risk for infections and bleeding  nausea, vomiting  redness, blistering, peeling or loosening of the skin, including inside the mouth  signs and symptoms of infection like fever; chills; cough; sore throat; pain or trouble passing urine  signs and symptoms of low red blood cells or anemia such as unusually weak or tired; feeling faint or lightheaded; falls; breathing problems  unusual bruising or bleeding Side effects that usually do not require medical attention (report to your doctor or health care professional if they continue or are bothersome):  changes in taste  diarrhea  hair loss  loss of appetite  mouth sores This list may not describe all possible side effects. Call your doctor for medical advice about side effects. You may report side effects to FDA at 1-800-FDA-1088. Where should I keep my medicine? This drug is given in a hospital or clinic and will not be stored at home. NOTE: This sheet is a summary. It may not cover all possible information. If you have questions about this medicine, talk to your doctor, pharmacist, or health care provider.  2020 Elsevier/Gold Standard (2018-05-06 16:57:15) Carboplatin injection What is this medicine? CARBOPLATIN (KAR boe pla tin) is a chemotherapy drug. It targets fast dividing cells, like cancer cells, and causes these cells to die. This medicine is used to treat ovarian cancer and many other cancers. This medicine may be used for other purposes; ask your health care provider or pharmacist if you have questions. COMMON BRAND NAME(S): Paraplatin What should I tell my health care provider before I take this medicine? They need to know if you have any of these conditions:  blood  disorders  hearing problems  kidney disease  recent or ongoing radiation therapy  an unusual or allergic reaction to carboplatin, cisplatin, other chemotherapy, other medicines, foods, dyes, or preservatives  pregnant or trying to get pregnant  breast-feeding How should I use this medicine? This drug is usually given as an infusion into a vein. It is administered in a hospital or clinic by a specially trained health care professional. Talk to your pediatrician regarding the use of this medicine in children. Special care may be needed. Overdosage: If you think you have taken too much of this medicine contact a poison control center or emergency room at once. NOTE: This medicine is only for you. Do not share this medicine with others. What if I miss a dose? It is important not to miss a dose. Call your doctor or health care professional if you are unable to keep an appointment. What may interact with this medicine?  medicines for seizures  medicines to increase blood counts like filgrastim, pegfilgrastim, sargramostim  some antibiotics like amikacin, gentamicin, neomycin, streptomycin, tobramycin  vaccines Talk to your doctor or health care professional before taking any of these medicines:  acetaminophen  aspirin  ibuprofen  ketoprofen  naproxen This list  may not describe all possible interactions. Give your health care provider a list of all the medicines, herbs, non-prescription drugs, or dietary supplements you use. Also tell them if you smoke, drink alcohol, or use illegal drugs. Some items may interact with your medicine. What should I watch for while using this medicine? Your condition will be monitored carefully while you are receiving this medicine. You will need important blood work done while you are taking this medicine. This drug may make you feel generally unwell. This is not uncommon, as chemotherapy can affect healthy cells as well as cancer cells. Report any  side effects. Continue your course of treatment even though you feel ill unless your doctor tells you to stop. In some cases, you may be given additional medicines to help with side effects. Follow all directions for their use. Call your doctor or health care professional for advice if you get a fever, chills or sore throat, or other symptoms of a cold or flu. Do not treat yourself. This drug decreases your body's ability to fight infections. Try to avoid being around people who are sick. This medicine may increase your risk to bruise or bleed. Call your doctor or health care professional if you notice any unusual bleeding. Be careful brushing and flossing your teeth or using a toothpick because you may get an infection or bleed more easily. If you have any dental work done, tell your dentist you are receiving this medicine. Avoid taking products that contain aspirin, acetaminophen, ibuprofen, naproxen, or ketoprofen unless instructed by your doctor. These medicines may hide a fever. Do not become pregnant while taking this medicine. Women should inform their doctor if they wish to become pregnant or think they might be pregnant. There is a potential for serious side effects to an unborn child. Talk to your health care professional or pharmacist for more information. Do not breast-feed an infant while taking this medicine. What side effects may I notice from receiving this medicine? Side effects that you should report to your doctor or health care professional as soon as possible:  allergic reactions like skin rash, itching or hives, swelling of the face, lips, or tongue  signs of infection - fever or chills, cough, sore throat, pain or difficulty passing urine  signs of decreased platelets or bleeding - bruising, pinpoint red spots on the skin, black, tarry stools, nosebleeds  signs of decreased red blood cells - unusually weak or tired, fainting spells, lightheadedness  breathing  problems  changes in hearing  changes in vision  chest pain  high blood pressure  low blood counts - This drug may decrease the number of white blood cells, red blood cells and platelets. You may be at increased risk for infections and bleeding.  nausea and vomiting  pain, swelling, redness or irritation at the injection site  pain, tingling, numbness in the hands or feet  problems with balance, talking, walking  trouble passing urine or change in the amount of urine Side effects that usually do not require medical attention (report to your doctor or health care professional if they continue or are bothersome):  hair loss  loss of appetite  metallic taste in the mouth or changes in taste This list may not describe all possible side effects. Call your doctor for medical advice about side effects. You may report side effects to FDA at 1-800-FDA-1088. Where should I keep my medicine? This drug is given in a hospital or clinic and will not be stored at home.  NOTE: This sheet is a summary. It may not cover all possible information. If you have questions about this medicine, talk to your doctor, pharmacist, or health care provider.  2020 Elsevier/Gold Standard (2007-06-16 14:38:05)

## 2020-02-01 ENCOUNTER — Inpatient Hospital Stay: Payer: Medicare Other

## 2020-02-01 ENCOUNTER — Other Ambulatory Visit: Payer: Self-pay

## 2020-02-01 DIAGNOSIS — E222 Syndrome of inappropriate secretion of antidiuretic hormone: Secondary | ICD-10-CM

## 2020-02-01 DIAGNOSIS — J449 Chronic obstructive pulmonary disease, unspecified: Secondary | ICD-10-CM | POA: Diagnosis not present

## 2020-02-01 DIAGNOSIS — E871 Hypo-osmolality and hyponatremia: Secondary | ICD-10-CM

## 2020-02-01 DIAGNOSIS — C3430 Malignant neoplasm of lower lobe, unspecified bronchus or lung: Secondary | ICD-10-CM

## 2020-02-01 DIAGNOSIS — D241 Benign neoplasm of right breast: Secondary | ICD-10-CM | POA: Diagnosis not present

## 2020-02-01 DIAGNOSIS — E876 Hypokalemia: Secondary | ICD-10-CM | POA: Diagnosis not present

## 2020-02-01 DIAGNOSIS — D649 Anemia, unspecified: Secondary | ICD-10-CM | POA: Diagnosis not present

## 2020-02-01 DIAGNOSIS — C3411 Malignant neoplasm of upper lobe, right bronchus or lung: Secondary | ICD-10-CM | POA: Diagnosis not present

## 2020-02-01 DIAGNOSIS — Z85038 Personal history of other malignant neoplasm of large intestine: Secondary | ICD-10-CM | POA: Diagnosis not present

## 2020-02-01 DIAGNOSIS — Z5111 Encounter for antineoplastic chemotherapy: Secondary | ICD-10-CM | POA: Diagnosis not present

## 2020-02-01 MED ORDER — SODIUM CHLORIDE 0.9 % IV SOLN
10.0000 mg | Freq: Once | INTRAVENOUS | Status: AC
  Administered 2020-02-01: 10 mg via INTRAVENOUS
  Filled 2020-02-01: qty 10

## 2020-02-01 MED ORDER — SODIUM CHLORIDE 0.9 % IV SOLN
Freq: Once | INTRAVENOUS | Status: AC
  Filled 2020-02-01: qty 250

## 2020-02-01 MED ORDER — HEPARIN SOD (PORK) LOCK FLUSH 100 UNIT/ML IV SOLN
500.0000 [IU] | Freq: Once | INTRAVENOUS | Status: AC | PRN
  Administered 2020-02-01: 500 [IU]
  Filled 2020-02-01: qty 5

## 2020-02-01 MED ORDER — SODIUM CHLORIDE 0.9% FLUSH
10.0000 mL | INTRAVENOUS | Status: DC | PRN
  Administered 2020-02-01 (×2): 10 mL
  Filled 2020-02-01: qty 10

## 2020-02-01 MED ORDER — SODIUM CHLORIDE 0.9 % IV SOLN
100.0000 mg/m2 | Freq: Once | INTRAVENOUS | Status: AC
  Administered 2020-02-01: 210 mg via INTRAVENOUS
  Filled 2020-02-01: qty 10.5

## 2020-02-01 NOTE — Progress Notes (Signed)
PT STABLE AT TIME OF DISCHARGE 

## 2020-02-01 NOTE — Patient Instructions (Signed)
Etoposide, VP-16 injection What is this medicine? ETOPOSIDE, VP-16 (e toe POE side) is a chemotherapy drug. It is used to treat testicular cancer, lung cancer, and other cancers. This medicine may be used for other purposes; ask your health care provider or pharmacist if you have questions. COMMON BRAND NAME(S): Etopophos, Toposar, VePesid What should I tell my health care provider before I take this medicine? They need to know if you have any of these conditions:  infection  kidney disease  liver disease  low blood counts, like low white cell, platelet, or red cell counts  an unusual or allergic reaction to etoposide, other medicines, foods, dyes, or preservatives  pregnant or trying to get pregnant  breast-feeding How should I use this medicine? This medicine is for infusion into a vein. It is administered in a hospital or clinic by a specially trained health care professional. Talk to your pediatrician regarding the use of this medicine in children. Special care may be needed. Overdosage: If you think you have taken too much of this medicine contact a poison control center or emergency room at once. NOTE: This medicine is only for you. Do not share this medicine with others. What if I miss a dose? It is important not to miss your dose. Call your doctor or health care professional if you are unable to keep an appointment. What may interact with this medicine? This medicine may interact with the following medications:  warfarin This list may not describe all possible interactions. Give your health care provider a list of all the medicines, herbs, non-prescription drugs, or dietary supplements you use. Also tell them if you smoke, drink alcohol, or use illegal drugs. Some items may interact with your medicine. What should I watch for while using this medicine? Visit your doctor for checks on your progress. This drug may make you feel generally unwell. This is not uncommon, as  chemotherapy can affect healthy cells as well as cancer cells. Report any side effects. Continue your course of treatment even though you feel ill unless your doctor tells you to stop. In some cases, you may be given additional medicines to help with side effects. Follow all directions for their use. Call your doctor or health care professional for advice if you get a fever, chills or sore throat, or other symptoms of a cold or flu. Do not treat yourself. This drug decreases your body's ability to fight infections. Try to avoid being around people who are sick. This medicine may increase your risk to bruise or bleed. Call your doctor or health care professional if you notice any unusual bleeding. Talk to your doctor about your risk of cancer. You may be more at risk for certain types of cancers if you take this medicine. Do not become pregnant while taking this medicine or for at least 6 months after stopping it. Women should inform their doctor if they wish to become pregnant or think they might be pregnant. Women of child-bearing potential will need to have a negative pregnancy test before starting this medicine. There is a potential for serious side effects to an unborn child. Talk to your health care professional or pharmacist for more information. Do not breast-feed an infant while taking this medicine. Men must use a latex condom during sexual contact with a woman while taking this medicine and for at least 4 months after stopping it. A latex condom is needed even if you have had a vasectomy. Contact your doctor right away if your partner  becomes pregnant. Do not donate sperm while taking this medicine and for at least 4 months after you stop taking this medicine. Men should inform their doctors if they wish to father a child. This medicine may lower sperm counts. What side effects may I notice from receiving this medicine? Side effects that you should report to your doctor or health care professional  as soon as possible:  allergic reactions like skin rash, itching or hives, swelling of the face, lips, or tongue  low blood counts - this medicine may decrease the number of white blood cells, red blood cells, and platelets. You may be at increased risk for infections and bleeding  nausea, vomiting  redness, blistering, peeling or loosening of the skin, including inside the mouth  signs and symptoms of infection like fever; chills; cough; sore throat; pain or trouble passing urine  signs and symptoms of low red blood cells or anemia such as unusually weak or tired; feeling faint or lightheaded; falls; breathing problems  unusual bruising or bleeding Side effects that usually do not require medical attention (report to your doctor or health care professional if they continue or are bothersome):  changes in taste  diarrhea  hair loss  loss of appetite  mouth sores This list may not describe all possible side effects. Call your doctor for medical advice about side effects. You may report side effects to FDA at 1-800-FDA-1088. Where should I keep my medicine? This drug is given in a hospital or clinic and will not be stored at home. NOTE: This sheet is a summary. It may not cover all possible information. If you have questions about this medicine, talk to your doctor, pharmacist, or health care provider.  2020 Elsevier/Gold Standard (2018-05-06 16:57:15)

## 2020-02-02 ENCOUNTER — Inpatient Hospital Stay: Payer: Medicare Other

## 2020-02-02 ENCOUNTER — Telehealth: Payer: Self-pay

## 2020-02-02 VITALS — BP 173/77 | HR 84 | Temp 98.6°F | Resp 22 | Ht 63.0 in | Wt 229.0 lb

## 2020-02-02 DIAGNOSIS — Z5111 Encounter for antineoplastic chemotherapy: Secondary | ICD-10-CM | POA: Diagnosis not present

## 2020-02-02 DIAGNOSIS — D241 Benign neoplasm of right breast: Secondary | ICD-10-CM | POA: Diagnosis not present

## 2020-02-02 DIAGNOSIS — J449 Chronic obstructive pulmonary disease, unspecified: Secondary | ICD-10-CM | POA: Diagnosis not present

## 2020-02-02 DIAGNOSIS — C3430 Malignant neoplasm of lower lobe, unspecified bronchus or lung: Secondary | ICD-10-CM

## 2020-02-02 DIAGNOSIS — E871 Hypo-osmolality and hyponatremia: Secondary | ICD-10-CM

## 2020-02-02 DIAGNOSIS — E222 Syndrome of inappropriate secretion of antidiuretic hormone: Secondary | ICD-10-CM

## 2020-02-02 DIAGNOSIS — C3411 Malignant neoplasm of upper lobe, right bronchus or lung: Secondary | ICD-10-CM | POA: Diagnosis not present

## 2020-02-02 DIAGNOSIS — D649 Anemia, unspecified: Secondary | ICD-10-CM | POA: Diagnosis not present

## 2020-02-02 DIAGNOSIS — E876 Hypokalemia: Secondary | ICD-10-CM | POA: Diagnosis not present

## 2020-02-02 DIAGNOSIS — Z85038 Personal history of other malignant neoplasm of large intestine: Secondary | ICD-10-CM | POA: Diagnosis not present

## 2020-02-02 MED ORDER — SODIUM CHLORIDE 0.9 % IV SOLN
100.0000 mg/m2 | Freq: Once | INTRAVENOUS | Status: AC
  Administered 2020-02-02: 210 mg via INTRAVENOUS
  Filled 2020-02-02: qty 10.5

## 2020-02-02 MED ORDER — SODIUM CHLORIDE 0.9 % IV SOLN
Freq: Once | INTRAVENOUS | Status: AC
  Filled 2020-02-02: qty 250

## 2020-02-02 MED ORDER — HEPARIN SOD (PORK) LOCK FLUSH 100 UNIT/ML IV SOLN
500.0000 [IU] | Freq: Once | INTRAVENOUS | Status: AC | PRN
  Administered 2020-02-02: 500 [IU]
  Filled 2020-02-02: qty 5

## 2020-02-02 MED ORDER — SODIUM CHLORIDE 0.9 % IV SOLN
10.0000 mg | Freq: Once | INTRAVENOUS | Status: AC
  Administered 2020-02-02: 10 mg via INTRAVENOUS
  Filled 2020-02-02: qty 10

## 2020-02-02 NOTE — Patient Instructions (Signed)
Etoposide, VP-16 injection What is this medicine? ETOPOSIDE, VP-16 (e toe POE side) is a chemotherapy drug. It is used to treat testicular cancer, lung cancer, and other cancers. This medicine may be used for other purposes; ask your health care provider or pharmacist if you have questions. COMMON BRAND NAME(S): Etopophos, Toposar, VePesid What should I tell my health care provider before I take this medicine? They need to know if you have any of these conditions:  infection  kidney disease  liver disease  low blood counts, like low white cell, platelet, or red cell counts  an unusual or allergic reaction to etoposide, other medicines, foods, dyes, or preservatives  pregnant or trying to get pregnant  breast-feeding How should I use this medicine? This medicine is for infusion into a vein. It is administered in a hospital or clinic by a specially trained health care professional. Talk to your pediatrician regarding the use of this medicine in children. Special care may be needed. Overdosage: If you think you have taken too much of this medicine contact a poison control center or emergency room at once. NOTE: This medicine is only for you. Do not share this medicine with others. What if I miss a dose? It is important not to miss your dose. Call your doctor or health care professional if you are unable to keep an appointment. What may interact with this medicine? This medicine may interact with the following medications:  warfarin This list may not describe all possible interactions. Give your health care provider a list of all the medicines, herbs, non-prescription drugs, or dietary supplements you use. Also tell them if you smoke, drink alcohol, or use illegal drugs. Some items may interact with your medicine. What should I watch for while using this medicine? Visit your doctor for checks on your progress. This drug may make you feel generally unwell. This is not uncommon, as  chemotherapy can affect healthy cells as well as cancer cells. Report any side effects. Continue your course of treatment even though you feel ill unless your doctor tells you to stop. In some cases, you may be given additional medicines to help with side effects. Follow all directions for their use. Call your doctor or health care professional for advice if you get a fever, chills or sore throat, or other symptoms of a cold or flu. Do not treat yourself. This drug decreases your body's ability to fight infections. Try to avoid being around people who are sick. This medicine may increase your risk to bruise or bleed. Call your doctor or health care professional if you notice any unusual bleeding. Talk to your doctor about your risk of cancer. You may be more at risk for certain types of cancers if you take this medicine. Do not become pregnant while taking this medicine or for at least 6 months after stopping it. Women should inform their doctor if they wish to become pregnant or think they might be pregnant. Women of child-bearing potential will need to have a negative pregnancy test before starting this medicine. There is a potential for serious side effects to an unborn child. Talk to your health care professional or pharmacist for more information. Do not breast-feed an infant while taking this medicine. Men must use a latex condom during sexual contact with a woman while taking this medicine and for at least 4 months after stopping it. A latex condom is needed even if you have had a vasectomy. Contact your doctor right away if your partner  becomes pregnant. Do not donate sperm while taking this medicine and for at least 4 months after you stop taking this medicine. Men should inform their doctors if they wish to father a child. This medicine may lower sperm counts. What side effects may I notice from receiving this medicine? Side effects that you should report to your doctor or health care professional  as soon as possible:  allergic reactions like skin rash, itching or hives, swelling of the face, lips, or tongue  low blood counts - this medicine may decrease the number of white blood cells, red blood cells, and platelets. You may be at increased risk for infections and bleeding  nausea, vomiting  redness, blistering, peeling or loosening of the skin, including inside the mouth  signs and symptoms of infection like fever; chills; cough; sore throat; pain or trouble passing urine  signs and symptoms of low red blood cells or anemia such as unusually weak or tired; feeling faint or lightheaded; falls; breathing problems  unusual bruising or bleeding Side effects that usually do not require medical attention (report to your doctor or health care professional if they continue or are bothersome):  changes in taste  diarrhea  hair loss  loss of appetite  mouth sores This list may not describe all possible side effects. Call your doctor for medical advice about side effects. You may report side effects to FDA at 1-800-FDA-1088. Where should I keep my medicine? This drug is given in a hospital or clinic and will not be stored at home. NOTE: This sheet is a summary. It may not cover all possible information. If you have questions about this medicine, talk to your doctor, pharmacist, or health care provider.  2020 Elsevier/Gold Standard (2018-05-06 16:57:15)

## 2020-02-02 NOTE — Telephone Encounter (Signed)
I called pt to see how she has tolerated the last couple of infusions. Pt states she is doing ok. Denies N/V, skin rashes, has remained afebrile, & no constipation/diarrhea. I reminded pt to call us if temp over 100.4 or any other questions/concerns. Pt verbalized understanding. [phone].

## 2020-02-02 NOTE — Progress Notes (Signed)
PT STABLE AT TIME OF DISCHARGE 

## 2020-02-03 ENCOUNTER — Other Ambulatory Visit: Payer: Self-pay

## 2020-02-03 NOTE — Progress Notes (Signed)
PT STABLE AT TIME OF DISCHARGE 

## 2020-02-03 NOTE — Progress Notes (Signed)
Patient is still undergoing scans which will help with final staging. The plan for this patient is chemo with possible concurrent radiation. This pt was presented to [facility] on 01/27/2020.

## 2020-02-04 ENCOUNTER — Other Ambulatory Visit: Payer: Self-pay

## 2020-02-04 ENCOUNTER — Inpatient Hospital Stay: Payer: Medicare Other

## 2020-02-04 VITALS — BP 159/71 | HR 93 | Temp 98.2°F | Resp 22 | Ht 63.0 in | Wt 229.0 lb

## 2020-02-04 DIAGNOSIS — Z85038 Personal history of other malignant neoplasm of large intestine: Secondary | ICD-10-CM | POA: Diagnosis not present

## 2020-02-04 DIAGNOSIS — C3430 Malignant neoplasm of lower lobe, unspecified bronchus or lung: Secondary | ICD-10-CM

## 2020-02-04 DIAGNOSIS — E876 Hypokalemia: Secondary | ICD-10-CM | POA: Diagnosis not present

## 2020-02-04 DIAGNOSIS — E871 Hypo-osmolality and hyponatremia: Secondary | ICD-10-CM | POA: Diagnosis not present

## 2020-02-04 DIAGNOSIS — D649 Anemia, unspecified: Secondary | ICD-10-CM | POA: Diagnosis not present

## 2020-02-04 DIAGNOSIS — D241 Benign neoplasm of right breast: Secondary | ICD-10-CM | POA: Diagnosis not present

## 2020-02-04 DIAGNOSIS — E222 Syndrome of inappropriate secretion of antidiuretic hormone: Secondary | ICD-10-CM

## 2020-02-04 DIAGNOSIS — C3411 Malignant neoplasm of upper lobe, right bronchus or lung: Secondary | ICD-10-CM | POA: Diagnosis not present

## 2020-02-04 DIAGNOSIS — J449 Chronic obstructive pulmonary disease, unspecified: Secondary | ICD-10-CM | POA: Diagnosis not present

## 2020-02-04 DIAGNOSIS — Z5111 Encounter for antineoplastic chemotherapy: Secondary | ICD-10-CM | POA: Diagnosis not present

## 2020-02-04 MED ORDER — PEGFILGRASTIM-CBQV 6 MG/0.6ML ~~LOC~~ SOSY
6.0000 mg | PREFILLED_SYRINGE | Freq: Once | SUBCUTANEOUS | Status: AC
  Administered 2020-02-04: 6 mg via SUBCUTANEOUS

## 2020-02-04 NOTE — Progress Notes (Signed)
PT STABLE AT TIME OF DISCHARGE 

## 2020-02-04 NOTE — Patient Instructions (Signed)

## 2020-02-09 ENCOUNTER — Other Ambulatory Visit: Payer: Self-pay | Admitting: Oncology

## 2020-02-09 DIAGNOSIS — C3432 Malignant neoplasm of lower lobe, left bronchus or lung: Secondary | ICD-10-CM

## 2020-02-09 NOTE — Progress Notes (Incomplete)
[facility]  [address] [place],  [place]  [address] [phone]  Clinic Day:  02/09/2020  Referring physician: Jeanie Sewer, NP   This document serves as a record of services personally performed by Hosie Poisson, MD. It was created on their behalf by Curry,Lauren E, a trained medical scribe. The creation of this record is based on the scribe's personal observations and the provider's statements to them.   CHIEF COMPLAINT:  CC: Stage IIIB small cell carcinoma  Current Treatment:  Carboplatin/etoposide   HISTORY OF PRESENT ILLNESS:  Gabrielle Hudson is a 79 y.o. female with stage IIB non-small-cell lung cancer of the right upper lobe diagnosed in November 2010.  She had a large endobronchial component causing obstruction, so underwent laser therapy at [facility] with a good response.  Due to severe emphysema and the location of the tumor being within 2 cm of the carina, she was not a surgical candidate.  She was treated with concurrent radiation and chemotherapy with carboplatin and paclitaxel, followed by an additional 2 months of carboplatin and paclitaxel with a good response.  She had bilateral pneumonia in December 2012.  CT scan at that time revealed some abnormality concerning for recurrence, so a PET scan was obtained in January 2013.  There was mild hypermetabolic activity in the right hilum, but the patient opted for observation.  Repeat CT scans had been stable, with scarring of the posterior right upper lobe.  She also has a history of a stage I adenocarcinoma of the colon, arising from a tubulovillous adenoma.  Her colonoscopy in August 2012 had removal of multiple benign polyps.  Her last mammogram was in December 2017.  She had her Port-A-Cath removed in May 2018. She quit smoking about 11 years ago.  CT chest in December 2018 revealed progression of amorphous soft tissue attenuation of the right hilum.  There  was no definite lymphadenopathy.  There were stable scattered tiny pulmonary nodules all less than 5 mm.  There is evidence of emphysema.  There is a stable right liver lesion measuring 2.5 cm which was unchanged for over 5 years.  CT chest in March of 2019 and September of 2019 were stable, with soft tissue attenuation is in the area of previous radiation.  CT chest in October of 2020 revealed all previously noted pulmonary nodules measuring  4 mm or less in size are stable compared to the prior examination, and are considered definitively benign. She also underwent bilateral diagnostic mammogram and right breast ultrasound which revealed the likely benign mass in the right breast at 2:30 is stable. CEA had decreased from 4.6 to 4.4.    She is now referred back from [facility] after diagnosis of a new small cell lung cancer.  She presented with hemoptysis and pain of the left lower thorax in mid October, 2021.  CT revealed tumor encasing and narrowing the left lower lobe pulmonary artery and the left lower lobe bronchus.  There is a mass of the left hilum measuring 5.2 cm with left lower lobe atelectasis.  She also has moderate emphysema and mild cardiomegaly, with evidence of coronary artery disease.  Biopsy on October 19th revealed small cell carcinoma with crush artifact.  She had associated hyponatremia as low as 125, and required demeclocycline.  She was also placed on prednisone, 40 mg daily for 5 days, then 20 mg daily.  She complains of occasional cough productive of creamy colored sputum and  occasional wheezing but no further hemoptysis.  She is using a lidoderm patch for her left sided chest pain with partial relief.  Her appetite is okay and she denies any weight loss. She was told she needed to have chemotherapy as soon as possible and appontment was made with Dr. Cruzita Lederer but she preferred to return here for her oncology care.  She had been hospitalized several times in August of  this year for hyponatremia before her new lung cancer was diagnosed.  In retrospect, the left hilar cancer is not obvious on chest x-ray.  Her white count has mildly decreased from 12.2 to 11.4, hemoglobin is stable at 11.3, and her platelet count is normal.  Iron level is 38.0 with a TIBC of 270 for a percent saturation of 14.0%.  Vitamin B12, folate and ferritin are normal.  Chemistries are remarkable for a potassium of 3.0, worse, and a BUN of 19, improved.  CEA is 14.1.  Her appetite is good, and she has lost 22 pounds since her last visit one year ago. She rates her pain at a 8/10.  She denies fever, chills or other signs of infection.  She denies nausea, vomiting, bowel issues, or abdominal pain.  She denies sore throat. She is on oxygen at 4 liter/min. per nasal cannula.  INTERVAL HISTORY:  Gabrielle Hudson is here for follow up after starting chemotherapy with carboplatin/etoposide on November 8th.   Her  appetite is good, and she has gained/lost _ pounds since her last visit.  She denies fever, chills or other signs of infection.  She denies nausea, vomiting, bowel issues, or abdominal pain.  She denies sore throat, cough, dyspnea, or chest pain.   REVIEW OF SYSTEMS:  Review of Systems - Oncology   VITALS:  There were no vitals taken for this visit.  Wt Readings from Last 3 Encounters:  02/04/20 229 lb (103.9 kg)  01/20/20 228 lb 1 oz (103.4 kg)  02/02/20 229 lb (103.9 kg)    There is no height or weight on file to calculate BMI.  Performance status (ECOG): {CHL ONC [id number]  PHYSICAL EXAM:  Physical Exam  LABS:   CBC Latest Ref Rng & Units 01/26/2020 03/31/2011 03/30/2011  WBC 4.0 - 10.5 K/uL 9.9 8.6 10.5  Hemoglobin 12.0 - 15.0 g/dL 11.4(L) 9.6(L) 10.1(L)  Hematocrit 36 - 46 % 36.8 29.5(L) 30.9(L)  Platelets 150 - 400 K/uL 270 176 197   CMP Latest Ref Rng & Units 01/26/2020 01/20/2020 04/01/2011  Glucose 70 - 99 mg/dL 111(H) - 93  BUN 8 - 23 mg/dL 21 - 72(H)  Creatinine 0.44 - 1.00  mg/dL 0.86 0.7 2.30(H)  Sodium 135 - 145 mmol/L 136 - 143  Potassium 3.5 - 5.1 mmol/L 3.4(L) - 4.4  Chloride 98 - 111 mmol/L 99 - 109  CO2 22 - 32 mmol/L 25 - 26  Calcium 8.9 - 10.3 mg/dL 8.7(L) - 7.4(L)  Total Protein 6.5 - 8.1 g/dL 6.8 - -  Total Bilirubin 0.3 - 1.2 mg/dL 0.7 - -  Alkaline Phos 38 - 126 U/L 105 - -  AST 15 - 41 U/L 20 - -  ALT 0 - 44 U/L 25 - -     No results found for: CEA1 / No results found for: CEA1 No results found for: PSA1 No results found for: FFM384 No results found for: YKZ993  Lab Results  Component Value Date   TOTALPROTELP 4.7 (L) 03/26/2011   ALBUMINELP 52.5 (L) 03/26/2011   A1GS 8.7 (H)  03/26/2011   A2GS 18.8 (H) 03/26/2011   BETS 4.5 (L) 03/26/2011   BETA2SER 4.7 03/26/2011   GAMS 10.8 (L) 03/26/2011   MSPIKE NOT DETECTED 03/26/2011   SPEI (NOTE) 03/26/2011   Lab Results  Component Value Date   TIBC 219 (L) 03/29/2011   FERRITIN 356 (H) 03/29/2011   IRONPCTSAT 65 (H) 03/29/2011   Lab Results  Component Value Date   LDH 349 (H) 03/26/2011     STUDIES:  No results found.   Allergies:  Allergies  Allergen Reactions  . Nitroglycerin In D5w Other (See Comments)    "flatlines"    Current Medications: Current Outpatient Medications  Medication Sig Dispense Refill  . albuterol (PROVENTIL) (5 MG/ML) 0.5% nebulizer solution Take 2.5 mg by nebulization daily.    . budesonide-formoterol (SYMBICORT) 160-4.5 MCG/ACT inhaler Inhale 2 puffs into the lungs 2 (two) times daily.      . Calcium Carbonate-Vitamin D (CALCIUM + D PO) Take 1 tablet by mouth daily.      . calcium citrate-vitamin D (CITRACAL+D) 315-200 MG-UNIT tablet Take 1 tablet by mouth 2 (two) times daily.    Marland Kitchen diltiazem (CARDIZEM) 60 MG tablet Take 1 tablet (60 mg total) by mouth 2 (two) times daily. 60 tablet 0  . furosemide (LASIX) 20 MG tablet Take 20 mg by mouth as needed.    . gabapentin (NEURONTIN) 300 MG capsule Take 300 mg by mouth as needed.    . IRON PO Take 1  tablet by mouth daily.      Marland Kitchen levothyroxine (SYNTHROID, LEVOTHROID) 175 MCG tablet Take 175 mcg by mouth daily.      Marland Kitchen omeprazole (PRILOSEC) 20 MG capsule Take 1 capsule (20 mg total) by mouth daily. 30 capsule 0  . ondansetron (ZOFRAN) 4 MG tablet Take 1 tablet (4 mg total) by mouth every 4 (four) hours as needed for nausea. 90 tablet 3  . OXYGEN Inhale 2 L into the lungs continuous.    . potassium chloride (KLOR-CON) 10 MEQ tablet Take 10 mEq by mouth daily.    . potassium chloride SA (KLOR-CON) 20 MEQ tablet Take 20 mEq by mouth 2 (two) times daily.    . predniSONE (DELTASONE) 10 MG tablet 50mg  po daily x 2 days, then 40mg  po daily x 2 days and continue decreasing by 10mg  every 2 days until finished. 30 tablet 0  . prochlorperazine (COMPAZINE) 10 MG tablet Take 1 tablet (10 mg total) by mouth every 6 (six) hours as needed for nausea or vomiting. 90 tablet 3  . ranitidine (ZANTAC) 75 MG tablet Take 75 mg by mouth daily.    . sodium chloride (OCEAN) 0.65 % nasal spray Place 1 spray into the nose as needed. For dry nose     . tiotropium (SPIRIVA) 18 MCG inhalation capsule Place 18 mcg into inhaler and inhale daily.       No current facility-administered medications for this visit.     ASSESSMENT & PLAN:   Assessment:   1. History of stage IIB non-small cell lung cancer in November 2010.  She remains without evidence of recurrent or metastatic disease.  2. History of stage I colon cancer.  She has not been compliant with follow-up colonoscopy.   3. Benign mass of the right breast at 2:30 which we will continue to monitor.  4. Severe oxygen dependant COPD.  5. New small cell carcinoma of the left hilar area which is at least a stage IIIB.  MRI of the brain was  negative but we will also obtain a CT of abdomen and pelvis.  6. Hyponatremia secondary to SIADH.  7. Mild anemia, normochromic, normocytic. I have ordered iron studies and B12 and folate levels.  8. Hypokalemia. I will place  her on KCL 20 meq twice daily.  Plan: She started chemotherapy with carboplatin/etoposide on November 8th. If the CT of the abdomen reveals metastatic disease outside the chest, she is a candidate also for immunotherapy. I have recommended Duoneb and they will follow-up with Dr. Alcide Clever.  We will postpone follow-up mammogram for now.  The patient and her daughter understand the plans discussed today and are in agreement with them.  She knows to contact our office if she develops concerns regarding her lung cancer.   I provided *** minutes (8:36 AM - 8:36 AM) of face-to-face time during this this encounter and > 50% was spent counseling as documented under my assessment and plan.    Derwood Kaplan, MD [facility] [address] [place] [address] Dept: [phone] Dept Fax: [phone]   I, Rita Ohara, am acting as scribe for Derwood Kaplan, MD  I have reviewed this report as typed by the medical scribe, and it is complete and accurate.

## 2020-02-10 ENCOUNTER — Other Ambulatory Visit: Payer: Medicare Other

## 2020-02-10 ENCOUNTER — Ambulatory Visit: Payer: Medicare Other | Admitting: Oncology

## 2020-02-10 DIAGNOSIS — C3432 Malignant neoplasm of lower lobe, left bronchus or lung: Secondary | ICD-10-CM

## 2020-02-14 NOTE — Progress Notes (Signed)
[facility]  [address] [place],  [place]  [address] [phone]  Clinic Day:  02/16/2020  Referring physician: Jeanie Sewer, NP   This document serves as a record of services personally performed by Hosie Poisson, MD. It was created on their behalf by Curry,Lauren E, a trained medical scribe. The creation of this record is based on the scribe's personal observations and the provider's statements to them.   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 female with stage IIB non-small-cell lung cancer of the right upper lobe diagnosed in November 2010.  She had a large endobronchial component causing obstruction, so underwent laser therapy at [facility] with a good response.  Due to severe emphysema and the location of the tumor being within 2 cm of the carina, she was not a surgical candidate.  She was treated with concurrent radiation and chemotherapy with carboplatin and paclitaxel, followed by an additional 2 months of carboplatin and paclitaxel with a good response.  She had bilateral pneumonia in December 2012.  CT scan at that time revealed some abnormality concerning for recurrence, so a PET scan was obtained in January 2013.  There was mild hypermetabolic activity in the right hilum, but the patient opted for observation.  Repeat CT scans had been stable, with scarring of the posterior right upper lobe.  She also has a history of a stage I adenocarcinoma of the colon, arising from a tubulovillous adenoma.  Her colonoscopy in August 2012 had removal of multiple benign polyps.  Her last mammogram was in December 2017.  She had her Port-A-Cath removed in May 2018. She quit smoking about 11 years ago.  CT chest in December 2018 revealed progression of amorphous soft tissue attenuation of the right  hilum.  There was no definite lymphadenopathy.  There were stable scattered tiny pulmonary nodules all less than 5 mm.  There is evidence of emphysema.  There is a stable right liver lesion measuring 2.5 cm which was unchanged for over 5 years.  CT chest in March of 2019 and September of 2019 were stable, with soft tissue attenuation is in the area of previous radiation.  CT chest in October of 2020 revealed all previously noted pulmonary nodules measuring  4 mm or less in size are stable compared to the prior examination, and are considered definitively benign. She also underwent bilateral diagnostic mammogram and right breast ultrasound which revealed the likely benign mass in the right breast at 2:30 is stable. CEA had decreased from 4.6 to 4.4.    She was referred back from [facility] in October 2021 after diagnosis of a new small cell lung cancer.  She presented with hemoptysis and pain of the left lower thorax.  CT revealed tumor encasing and narrowing the left lower lobe pulmonary artery and the left lower lobe bronchus.  There is a mass of the left hilum measuring 5.2 cm with left lower lobe atelectasis.  She also has moderate emphysema and mild cardiomegaly, with evidence of coronary artery disease.  Biopsy on October 19th revealed small cell carcinoma with crush artifact.  She had associated hyponatremia as low as 125, and required demeclocycline.  She was also placed on prednisone, 40 mg daily for 5 days, then 20 mg daily.  She complains of occasional cough productive of creamy  colored sputum and occasional wheezing but no further hemoptysis.  She is using a lidoderm patch for her left sided chest pain with partial relief.  She had been hospitalized several times in August of this year for hyponatremia before her new lung cancer was diagnosed.  In retrospect, the left hilar cancer was not obvious on chest x-ray.  She was found to be iron deficient and her potassium was down to 3.0 at  he last visit.  Baseline CEA was 14.1.   INTERVAL HISTORY:  Gabrielle Hudson is here for follow up after starting treatment with carboplatin/etoposide on November 3rd.  She states that the treatment went well and denied any complaints initially.  However, she now reports feeling poorly with weakness, dizziness and spends at least 50% of her days in bed.  Her shortness of breath remains stable and she remains on 3 L of oxygen per nasal cannula.  She states that she was not able to make her appointment for the CT abdomen.  She does report some tenderness of the right upper quadrant and has elevation of the SGPT so we will reschedule this for her.  Her hemoglobin has decreased from 11.3 to 10.0, and her white count and platelets are normal.  Chemistries are remarkable for a sodium of 130, down from 135, a potassium of 3.2, and an SGPT of 60.  She states that she has not been taking her salt tablets and oral potassium for a few days, so I advised that she resume this.  She does not seem to recall taking the demeclocycline so she must have stopped this when she ran out.  She had lost a great deal of weight prior to her treatment.  She has visual blurring which has been present for years and is anxious to see her eye doctor.  Her  appetite is poor, and she has lost another 14 pounds since her last visit.  She denies fever, chills or other signs of infection.  She denies nausea, vomiting, bowel issues, or abdominal pain.  She denies sore throat, cough, or chest pain.   REVIEW OF SYSTEMS:  Review of Systems  Constitutional: Positive for fatigue (severe, spending at least 50% of the day in bed).  Respiratory: Positive for shortness of breath (stable, she remains on 4 L of oxygen).   Neurological: Positive for dizziness.  All other systems reviewed and are negative.    VITALS:  Blood pressure 133/67, pulse 99, temperature 98.2 F (36.8 C), temperature source Oral, height 5\' 3"  (1.6 m), weight 213 lb 9 oz (96.9 kg),  SpO2 98 %.  Wt Readings from Last 3 Encounters:  02/16/20 213 lb 9 oz (96.9 kg)  02/04/20 229 lb (103.9 kg)  01/20/20 228 lb 1 oz (103.4 kg)    Body mass index is 37.83 kg/m.  Performance status (ECOG): 3 - Symptomatic, >50% confined to bed  PHYSICAL EXAM:  Physical Exam Constitutional:      General: She is not in acute distress.    Appearance: Normal appearance. She is normal weight.  HENT:     Head: Normocephalic and atraumatic.  Eyes:     General: No scleral icterus.    Extraocular Movements: Extraocular movements intact.     Conjunctiva/sclera: Conjunctivae normal.     Pupils: Pupils are equal, round, and reactive to light.  Cardiovascular:     Rate and Rhythm: Normal rate and regular rhythm.     Pulses: Normal pulses.     Heart sounds: Normal heart sounds. No  murmur heard.  No friction rub. No gallop.   Pulmonary:     Effort: Pulmonary effort is normal. No respiratory distress.     Breath sounds: Examination of the left-upper field reveals decreased breath sounds. Examination of the left-middle field reveals decreased breath sounds. Examination of the left-lower field reveals decreased breath sounds. Decreased breath sounds present.  Abdominal:     General: Bowel sounds are normal. There is no distension.     Palpations: Abdomen is soft. There is no mass.     Tenderness: There is no abdominal tenderness.     Comments: Tenderness in the left lower quadrant and right upper quadrant.  Musculoskeletal:        General: Normal range of motion.     Cervical back: Normal range of motion and neck supple.     Right lower leg: No edema.     Left lower leg: No edema.  Lymphadenopathy:     Cervical: No cervical adenopathy.  Skin:    General: Skin is warm and dry.     Comments: Residual bruising of the port site in the right upper chest.   Neurological:     General: No focal deficit present.     Mental Status: She is alert and oriented to person, place, and time. Mental status  is at baseline.  Psychiatric:        Mood and Affect: Mood normal.        Behavior: Behavior normal.        Thought Content: Thought content normal.        Judgment: Judgment normal.      LABS:   CBC Latest Ref Rng & Units 02/16/2020 01/26/2020 03/31/2011  WBC - 10.7 9.9 8.6  Hemoglobin 12.0 - 16.0 10.0(A) 11.4(L) 9.6(L)  Hematocrit 36 - 46 31(A) 36.8 29.5(L)  Platelets 150 - 399 266 270 176   CMP Latest Ref Rng & Units 02/16/2020 01/26/2020 01/20/2020  Glucose 70 - 99 mg/dL - 111(H) -  BUN 4 - 21 15 21  -  Creatinine 0.5 - 1.1 0.8 0.86 0.7  Sodium 137 - 147 130(A) 136 -  Potassium 3.4 - 5.3 3.2(A) 3.4(L) -  Chloride 99 - 108 88(A) 99 -  CO2 13 - 22 30(A) 25 -  Calcium 8.7 - 10.7 9.0 8.7(L) -  Total Protein 6.5 - 8.1 g/dL - 6.8 -  Total Bilirubin 0.3 - 1.2 mg/dL - 0.7 -  Alkaline Phos 25 - 125 159(A) 105 -  AST 13 - 35 36(A) 20 -  ALT 7 - 35 60(A) 25 -     No results found for: CEA1 / No results found for: CEA1   Lab Results  Component Value Date   TOTALPROTELP 4.7 (L) 03/26/2011   ALBUMINELP 52.5 (L) 03/26/2011   A1GS 8.7 (H) 03/26/2011   A2GS 18.8 (H) 03/26/2011   BETS 4.5 (L) 03/26/2011   BETA2SER 4.7 03/26/2011   GAMS 10.8 (L) 03/26/2011   MSPIKE NOT DETECTED 03/26/2011   SPEI (NOTE) 03/26/2011   Lab Results  Component Value Date   TIBC 219 (L) 03/29/2011   FERRITIN 356 (H) 03/29/2011   IRONPCTSAT 65 (H) 03/29/2011   Lab Results  Component Value Date   LDH 349 (H) 03/26/2011     STUDIES:  No results found.   Allergies:  Allergies  Allergen Reactions  . Nitroglycerin In D5w Other (See Comments)    "flatlines"    Current Medications: Current Outpatient Medications  Medication Sig Dispense Refill  .  albuterol (PROVENTIL) (5 MG/ML) 0.5% nebulizer solution Take 2.5 mg by nebulization daily.    . budesonide-formoterol (SYMBICORT) 160-4.5 MCG/ACT inhaler Inhale 2 puffs into the lungs 2 (two) times daily.      . Calcium Carbonate-Vitamin D (CALCIUM +  D PO) Take 1 tablet by mouth daily.      . calcium citrate-vitamin D (CITRACAL+D) 315-200 MG-UNIT tablet Take 1 tablet by mouth 2 (two) times daily.    Marland Kitchen diltiazem (CARDIZEM) 60 MG tablet Take 1 tablet (60 mg total) by mouth 2 (two) times daily. 60 tablet 0  . furosemide (LASIX) 20 MG tablet Take 20 mg by mouth as needed.    . gabapentin (NEURONTIN) 300 MG capsule Take 300 mg by mouth as needed.    Marland Kitchen levothyroxine (SYNTHROID, LEVOTHROID) 175 MCG tablet Take 175 mcg by mouth daily.      Marland Kitchen omeprazole (PRILOSEC) 20 MG capsule Take 1 capsule (20 mg total) by mouth daily. 30 capsule 0  . ondansetron (ZOFRAN) 4 MG tablet Take 1 tablet (4 mg total) by mouth every 4 (four) hours as needed for nausea. 90 tablet 3  . OXYGEN Inhale 2 L into the lungs continuous.    . potassium chloride (KLOR-CON) 10 MEQ tablet Take 10 mEq by mouth daily.    . potassium chloride SA (KLOR-CON) 20 MEQ tablet Take 20 mEq by mouth 2 (two) times daily.    . predniSONE (DELTASONE) 10 MG tablet 50mg  po daily x 2 days, then 40mg  po daily x 2 days and continue decreasing by 10mg  every 2 days until finished. 30 tablet 0  . prochlorperazine (COMPAZINE) 10 MG tablet Take 1 tablet (10 mg total) by mouth every 6 (six) hours as needed for nausea or vomiting. 90 tablet 3   No current facility-administered medications for this visit.     ASSESSMENT & PLAN:   Assessment:   1. History of stage IIB non-small cell lung cancer in November 2010.  She remains without evidence of recurrent or metastatic disease.  2. History of stage I colon cancer.  She has not been compliant with follow-up colonoscopy.   3. Benign mass of the right breast at 2:30 which we will continue to monitor.  4. Severe oxygen dependant COPD.  5. 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  MRI of the brain was negative.  We ordered a CT of abdomen and pelvis in order to be sure this is not stage IV.  She  was unable to make that appointment so we will reschedule.  6. Hyponatremia secondary to SIADH.  I advised that she resume her salt tablets.  She may require demeclocycline again if this does not improve.  I also asked her to avoid the furosemide unless absolutely necessary.  7. Mild anemia, normochromic, normocytic.  Iron studies were consistent with deficiency.  This has worsened, but most likely secondary to chemotherapy.  8. Hypokalemia. I recommend that she resume KCL 20 meq twice daily.  Plan: She started treatment with carboplatin/etoposide on November 3rd.  She states that the treatment went well and denied any complaints initially.  However, she now reports feeling poorly with weakness and spends at least 50% of her days in bed.  With her current performance status, I am concerned whether she could tolerate further treatment.  However, the patient wishes to proceed.  In view of her aggressive small cell lung cancer, this will be her only chance  for survival.  Her current performance status could be due to worsening malignancy, and so I do recommend that we reschedule the CT abdomen to rule out metastatic disease.  She knows to resume her salt tablets as well as the oral potassium as she has not been taking these routinely.  For now, I advise that she avoid any diuretic unless necessary.  Her anemia has also worsened, and so I recommend that she resume oral iron supplement daily.  She will start her 2nd cycle of chemotherapy on November 29th, and we will repeat CBC, CMP and CEA on November 30th.  We will see her back in 2 week with CBC and CMP for supportive care.  She does have a DNR in place.  The patient and her daughter understand the plans discussed today and are in agreement with them.  She knows to contact our office if she develops concerns regarding her lung cancer.   I provided 35 minutes of face-to-face time during this this encounter and > 50% was spent counseling as documented under  my assessment and plan.    Derwood Kaplan, MD [facility] [address] [place] [address] Dept: [phone] Dept Fax: [phone]   I, Rita Ohara, am acting as scribe for Derwood Kaplan, MD  I have reviewed this report as typed by the medical scribe, and it is complete and accurate.

## 2020-02-16 ENCOUNTER — Inpatient Hospital Stay (INDEPENDENT_AMBULATORY_CARE_PROVIDER_SITE_OTHER): Payer: Medicare Other | Admitting: Oncology

## 2020-02-16 ENCOUNTER — Other Ambulatory Visit: Payer: Self-pay

## 2020-02-16 ENCOUNTER — Telehealth: Payer: Self-pay | Admitting: Oncology

## 2020-02-16 ENCOUNTER — Encounter: Payer: Self-pay | Admitting: Oncology

## 2020-02-16 ENCOUNTER — Inpatient Hospital Stay: Payer: Medicare Other | Admitting: Hematology and Oncology

## 2020-02-16 VITALS — BP 133/67 | HR 99 | Temp 98.2°F | Ht 63.0 in | Wt 213.6 lb

## 2020-02-16 DIAGNOSIS — C3432 Malignant neoplasm of lower lobe, left bronchus or lung: Secondary | ICD-10-CM

## 2020-02-16 DIAGNOSIS — Z0001 Encounter for general adult medical examination with abnormal findings: Secondary | ICD-10-CM | POA: Diagnosis not present

## 2020-02-16 DIAGNOSIS — C3491 Malignant neoplasm of unspecified part of right bronchus or lung: Secondary | ICD-10-CM | POA: Diagnosis not present

## 2020-02-16 LAB — CBC AND DIFFERENTIAL
HCT: 31 — AB (ref 36–46)
Hemoglobin: 10 — AB (ref 12.0–16.0)
Neutrophils Absolute: 6.96
Platelets: 266 (ref 150–399)
WBC: 10.7

## 2020-02-16 LAB — BASIC METABOLIC PANEL
BUN: 15 (ref 4–21)
CO2: 30 — AB (ref 13–22)
Chloride: 88 — AB (ref 99–108)
Creatinine: 0.8 (ref 0.5–1.1)
Glucose: 131
Potassium: 3.2 — AB (ref 3.4–5.3)
Sodium: 130 — AB (ref 137–147)

## 2020-02-16 LAB — CBC: RBC: 3.56 — AB (ref 3.87–5.11)

## 2020-02-16 LAB — HEPATIC FUNCTION PANEL
ALT: 60 — AB (ref 7–35)
AST: 36 — AB (ref 13–35)
Alkaline Phosphatase: 159 — AB (ref 25–125)
Bilirubin, Total: 0.4

## 2020-02-16 LAB — COMPREHENSIVE METABOLIC PANEL
Albumin: 3.5 (ref 3.5–5.0)
Calcium: 9 (ref 8.7–10.7)

## 2020-02-16 NOTE — Telephone Encounter (Signed)
Per 11/24 LOS - R/S Appt's - Scheduled Appt's. Rescheduled CT Scan - Patient Summary/Appt Calendars give to patient/daughter.

## 2020-02-16 NOTE — Progress Notes (Signed)
Pt not physically able to exercise. She is w/c bound when out of the home.

## 2020-02-18 DIAGNOSIS — J45998 Other asthma: Secondary | ICD-10-CM | POA: Diagnosis not present

## 2020-02-21 ENCOUNTER — Other Ambulatory Visit: Payer: Self-pay

## 2020-02-21 ENCOUNTER — Other Ambulatory Visit: Payer: Self-pay | Admitting: Pharmacist

## 2020-02-21 ENCOUNTER — Inpatient Hospital Stay: Payer: Medicare Other

## 2020-02-21 VITALS — BP 133/76 | HR 104 | Temp 98.1°F | Resp 22 | Ht 63.0 in | Wt 218.0 lb

## 2020-02-21 DIAGNOSIS — J449 Chronic obstructive pulmonary disease, unspecified: Secondary | ICD-10-CM | POA: Diagnosis not present

## 2020-02-21 DIAGNOSIS — D649 Anemia, unspecified: Secondary | ICD-10-CM | POA: Diagnosis not present

## 2020-02-21 DIAGNOSIS — Z85038 Personal history of other malignant neoplasm of large intestine: Secondary | ICD-10-CM | POA: Diagnosis not present

## 2020-02-21 DIAGNOSIS — E876 Hypokalemia: Secondary | ICD-10-CM | POA: Diagnosis not present

## 2020-02-21 DIAGNOSIS — E871 Hypo-osmolality and hyponatremia: Secondary | ICD-10-CM

## 2020-02-21 DIAGNOSIS — E222 Syndrome of inappropriate secretion of antidiuretic hormone: Secondary | ICD-10-CM

## 2020-02-21 DIAGNOSIS — C3430 Malignant neoplasm of lower lobe, unspecified bronchus or lung: Secondary | ICD-10-CM

## 2020-02-21 DIAGNOSIS — Z5111 Encounter for antineoplastic chemotherapy: Secondary | ICD-10-CM | POA: Diagnosis not present

## 2020-02-21 DIAGNOSIS — C3411 Malignant neoplasm of upper lobe, right bronchus or lung: Secondary | ICD-10-CM | POA: Diagnosis not present

## 2020-02-21 DIAGNOSIS — D241 Benign neoplasm of right breast: Secondary | ICD-10-CM | POA: Diagnosis not present

## 2020-02-21 MED ORDER — SODIUM CHLORIDE 0.9 % IV SOLN
150.0000 mg | Freq: Once | INTRAVENOUS | Status: AC
  Administered 2020-02-21: 150 mg via INTRAVENOUS
  Filled 2020-02-21: qty 5

## 2020-02-21 MED ORDER — SODIUM CHLORIDE 0.9 % IV SOLN
100.0000 mg/m2 | Freq: Once | INTRAVENOUS | Status: AC
  Administered 2020-02-21: 210 mg via INTRAVENOUS
  Filled 2020-02-21: qty 10.5

## 2020-02-21 MED ORDER — FAMOTIDINE IN NACL 20-0.9 MG/50ML-% IV SOLN
20.0000 mg | Freq: Once | INTRAVENOUS | Status: AC
  Administered 2020-02-21: 20 mg via INTRAVENOUS

## 2020-02-21 MED ORDER — SODIUM CHLORIDE 0.9 % IV SOLN
10.0000 mg | Freq: Once | INTRAVENOUS | Status: AC
  Administered 2020-02-21: 10 mg via INTRAVENOUS
  Filled 2020-02-21: qty 1

## 2020-02-21 MED ORDER — HEPARIN SOD (PORK) LOCK FLUSH 100 UNIT/ML IV SOLN
500.0000 [IU] | Freq: Once | INTRAVENOUS | Status: AC | PRN
  Administered 2020-02-21: 500 [IU]
  Filled 2020-02-21: qty 5

## 2020-02-21 MED ORDER — FAMOTIDINE IN NACL 20-0.9 MG/50ML-% IV SOLN
INTRAVENOUS | Status: AC
  Filled 2020-02-21: qty 50

## 2020-02-21 MED ORDER — DIPHENHYDRAMINE HCL 50 MG/ML IJ SOLN
50.0000 mg | Freq: Once | INTRAMUSCULAR | Status: AC
  Administered 2020-02-21: 50 mg via INTRAVENOUS

## 2020-02-21 MED ORDER — SODIUM CHLORIDE 0.9 % IV SOLN
Freq: Once | INTRAVENOUS | Status: AC
  Filled 2020-02-21: qty 250

## 2020-02-21 MED ORDER — SODIUM CHLORIDE 0.9 % IV SOLN
504.0000 mg | Freq: Once | INTRAVENOUS | Status: AC
  Administered 2020-02-21: 500 mg via INTRAVENOUS
  Filled 2020-02-21: qty 50

## 2020-02-21 MED ORDER — DIPHENHYDRAMINE HCL 50 MG/ML IJ SOLN
INTRAMUSCULAR | Status: AC
  Filled 2020-02-21: qty 1

## 2020-02-21 MED ORDER — PALONOSETRON HCL INJECTION 0.25 MG/5ML
INTRAVENOUS | Status: AC
  Filled 2020-02-21: qty 5

## 2020-02-21 MED ORDER — PALONOSETRON HCL INJECTION 0.25 MG/5ML
0.2500 mg | Freq: Once | INTRAVENOUS | Status: AC
  Administered 2020-02-21: 0.25 mg via INTRAVENOUS

## 2020-02-21 NOTE — Progress Notes (Signed)
PT STABLE AT TIME OF DISCHARGE 

## 2020-02-21 NOTE — Patient Instructions (Signed)
[facility] Discharge Instructions for Patients Receiving Chemotherapy  Today you received the following chemotherapy agents Etoposide, Carboplatin  To help prevent nausea and vomiting after your treatment, we encourage you to take your nausea medication.   If you develop nausea and vomiting that is not controlled by your nausea medication, call the clinic.   BELOW ARE SYMPTOMS THAT SHOULD BE REPORTED IMMEDIATELY:  *FEVER GREATER THAN 100.5 F  *CHILLS WITH OR WITHOUT FEVER  NAUSEA AND VOMITING THAT IS NOT CONTROLLED WITH YOUR NAUSEA MEDICATION  *UNUSUAL SHORTNESS OF BREATH  *UNUSUAL BRUISING OR BLEEDING  TENDERNESS IN MOUTH AND THROAT WITH OR WITHOUT PRESENCE OF ULCERS  *URINARY PROBLEMS  *BOWEL PROBLEMS  UNUSUAL RASH Items with * indicate a potential emergency and should be followed up as soon as possible.  Feel free to call the clinic should you have any questions or concerns at The clinic phone number is [phone].  Please show the [place] at check-in to the Emergency Department and triage nurse.

## 2020-02-22 ENCOUNTER — Inpatient Hospital Stay: Payer: Medicare Other

## 2020-02-22 ENCOUNTER — Telehealth: Payer: Self-pay

## 2020-02-22 VITALS — BP 146/72 | HR 88 | Temp 97.8°F | Resp 18 | Ht 63.0 in | Wt 218.1 lb

## 2020-02-22 DIAGNOSIS — D241 Benign neoplasm of right breast: Secondary | ICD-10-CM | POA: Diagnosis not present

## 2020-02-22 DIAGNOSIS — J449 Chronic obstructive pulmonary disease, unspecified: Secondary | ICD-10-CM | POA: Diagnosis not present

## 2020-02-22 DIAGNOSIS — C3411 Malignant neoplasm of upper lobe, right bronchus or lung: Secondary | ICD-10-CM | POA: Diagnosis not present

## 2020-02-22 DIAGNOSIS — C3490 Malignant neoplasm of unspecified part of unspecified bronchus or lung: Secondary | ICD-10-CM | POA: Diagnosis not present

## 2020-02-22 DIAGNOSIS — D649 Anemia, unspecified: Secondary | ICD-10-CM | POA: Diagnosis not present

## 2020-02-22 DIAGNOSIS — Z85038 Personal history of other malignant neoplasm of large intestine: Secondary | ICD-10-CM | POA: Diagnosis not present

## 2020-02-22 DIAGNOSIS — E876 Hypokalemia: Secondary | ICD-10-CM | POA: Diagnosis not present

## 2020-02-22 DIAGNOSIS — E871 Hypo-osmolality and hyponatremia: Secondary | ICD-10-CM | POA: Diagnosis not present

## 2020-02-22 DIAGNOSIS — E222 Syndrome of inappropriate secretion of antidiuretic hormone: Secondary | ICD-10-CM

## 2020-02-22 DIAGNOSIS — R970 Elevated carcinoembryonic antigen [CEA]: Secondary | ICD-10-CM | POA: Diagnosis not present

## 2020-02-22 DIAGNOSIS — C3430 Malignant neoplasm of lower lobe, unspecified bronchus or lung: Secondary | ICD-10-CM

## 2020-02-22 DIAGNOSIS — C3432 Malignant neoplasm of lower lobe, left bronchus or lung: Secondary | ICD-10-CM

## 2020-02-22 DIAGNOSIS — Z5111 Encounter for antineoplastic chemotherapy: Secondary | ICD-10-CM | POA: Diagnosis not present

## 2020-02-22 DIAGNOSIS — R6889 Other general symptoms and signs: Secondary | ICD-10-CM | POA: Diagnosis not present

## 2020-02-22 MED ORDER — SODIUM CHLORIDE 0.9 % IV SOLN
10.0000 mg | Freq: Once | INTRAVENOUS | Status: AC
  Administered 2020-02-22: 10 mg via INTRAVENOUS
  Filled 2020-02-22: qty 10

## 2020-02-22 MED ORDER — SODIUM CHLORIDE 0.9 % IV SOLN
Freq: Once | INTRAVENOUS | Status: AC
  Filled 2020-02-22: qty 250

## 2020-02-22 MED ORDER — SODIUM CHLORIDE 0.9 % IV SOLN
100.0000 mg/m2 | Freq: Once | INTRAVENOUS | Status: AC
  Administered 2020-02-22: 210 mg via INTRAVENOUS
  Filled 2020-02-22: qty 10.5

## 2020-02-22 MED ORDER — HEPARIN SOD (PORK) LOCK FLUSH 100 UNIT/ML IV SOLN
500.0000 [IU] | Freq: Once | INTRAVENOUS | Status: AC | PRN
  Administered 2020-02-22: 500 [IU]
  Filled 2020-02-22: qty 5

## 2020-02-22 NOTE — Progress Notes (Signed)
PT STABLE AT TIME OF DISCHARGE 

## 2020-02-22 NOTE — Patient Instructions (Signed)
[facility] Discharge Instructions for Patients Receiving Chemotherapy  Today you received the following chemotherapy agents etoposide  To help prevent nausea and vomiting after your treatment, we encourage you to take your nausea medication.   If you develop nausea and vomiting that is not controlled by your nausea medication, call the clinic.   BELOW ARE SYMPTOMS THAT SHOULD BE REPORTED IMMEDIATELY:  *FEVER GREATER THAN 100.5 F  *CHILLS WITH OR WITHOUT FEVER  NAUSEA AND VOMITING THAT IS NOT CONTROLLED WITH YOUR NAUSEA MEDICATION  *UNUSUAL SHORTNESS OF BREATH  *UNUSUAL BRUISING OR BLEEDING  TENDERNESS IN MOUTH AND THROAT WITH OR WITHOUT PRESENCE OF ULCERS  *URINARY PROBLEMS  *BOWEL PROBLEMS  UNUSUAL RASH Items with * indicate a potential emergency and should be followed up as soon as possible.  Feel free to call the clinic should you have any questions or concerns at The clinic phone number is [phone].  Please show the [facility] at check-in to the Emergency Department and triage nurse.

## 2020-02-23 ENCOUNTER — Ambulatory Visit: Payer: Medicare Other

## 2020-02-23 DIAGNOSIS — H353131 Nonexudative age-related macular degeneration, bilateral, early dry stage: Secondary | ICD-10-CM | POA: Diagnosis not present

## 2020-02-24 ENCOUNTER — Inpatient Hospital Stay: Payer: Medicare Other | Attending: Oncology

## 2020-02-24 ENCOUNTER — Other Ambulatory Visit: Payer: Self-pay

## 2020-02-24 VITALS — BP 169/71 | HR 96 | Temp 98.1°F | Resp 20 | Ht 63.0 in | Wt 230.0 lb

## 2020-02-24 DIAGNOSIS — Z5189 Encounter for other specified aftercare: Secondary | ICD-10-CM | POA: Insufficient documentation

## 2020-02-24 DIAGNOSIS — Z85038 Personal history of other malignant neoplasm of large intestine: Secondary | ICD-10-CM | POA: Diagnosis not present

## 2020-02-24 DIAGNOSIS — D649 Anemia, unspecified: Secondary | ICD-10-CM | POA: Diagnosis not present

## 2020-02-24 DIAGNOSIS — D241 Benign neoplasm of right breast: Secondary | ICD-10-CM | POA: Insufficient documentation

## 2020-02-24 DIAGNOSIS — J449 Chronic obstructive pulmonary disease, unspecified: Secondary | ICD-10-CM | POA: Diagnosis not present

## 2020-02-24 DIAGNOSIS — E876 Hypokalemia: Secondary | ICD-10-CM | POA: Insufficient documentation

## 2020-02-24 DIAGNOSIS — Z5111 Encounter for antineoplastic chemotherapy: Secondary | ICD-10-CM | POA: Insufficient documentation

## 2020-02-24 DIAGNOSIS — C3411 Malignant neoplasm of upper lobe, right bronchus or lung: Secondary | ICD-10-CM | POA: Diagnosis not present

## 2020-02-24 DIAGNOSIS — C3430 Malignant neoplasm of lower lobe, unspecified bronchus or lung: Secondary | ICD-10-CM

## 2020-02-24 DIAGNOSIS — E222 Syndrome of inappropriate secretion of antidiuretic hormone: Secondary | ICD-10-CM

## 2020-02-24 DIAGNOSIS — E871 Hypo-osmolality and hyponatremia: Secondary | ICD-10-CM | POA: Diagnosis not present

## 2020-02-24 MED ORDER — SODIUM CHLORIDE 0.9 % IV SOLN
100.0000 mg/m2 | Freq: Once | INTRAVENOUS | Status: AC
  Administered 2020-02-24: 210 mg via INTRAVENOUS
  Filled 2020-02-24: qty 10.5

## 2020-02-24 MED ORDER — SODIUM CHLORIDE 0.9% FLUSH
10.0000 mL | INTRAVENOUS | Status: DC | PRN
  Administered 2020-02-24: 10 mL
  Filled 2020-02-24: qty 10

## 2020-02-24 MED ORDER — HEPARIN SOD (PORK) LOCK FLUSH 100 UNIT/ML IV SOLN
500.0000 [IU] | Freq: Once | INTRAVENOUS | Status: AC | PRN
  Administered 2020-02-24: 500 [IU]
  Filled 2020-02-24: qty 5

## 2020-02-24 MED ORDER — SODIUM CHLORIDE 0.9 % IV SOLN
10.0000 mg | Freq: Once | INTRAVENOUS | Status: AC
  Administered 2020-02-24: 10 mg via INTRAVENOUS
  Filled 2020-02-24: qty 10

## 2020-02-24 MED ORDER — SODIUM CHLORIDE 0.9 % IV SOLN
Freq: Once | INTRAVENOUS | Status: AC
  Filled 2020-02-24: qty 250

## 2020-02-24 NOTE — Progress Notes (Signed)
Stable at time of discharge.

## 2020-02-24 NOTE — Patient Instructions (Signed)
[facility] Discharge Instructions for Patients Receiving Chemotherapy  Today you received the following chemotherapy agents Etoposide  To help prevent nausea and vomiting after your treatment, we encourage you to take your nausea medication.   If you develop nausea and vomiting that is not controlled by your nausea medication, call the clinic.   BELOW ARE SYMPTOMS THAT SHOULD BE REPORTED IMMEDIATELY:  *FEVER GREATER THAN 100.5 F  *CHILLS WITH OR WITHOUT FEVER  NAUSEA AND VOMITING THAT IS NOT CONTROLLED WITH YOUR NAUSEA MEDICATION  *UNUSUAL SHORTNESS OF BREATH  *UNUSUAL BRUISING OR BLEEDING  TENDERNESS IN MOUTH AND THROAT WITH OR WITHOUT PRESENCE OF ULCERS  *URINARY PROBLEMS  *BOWEL PROBLEMS  UNUSUAL RASH Items with * indicate a potential emergency and should be followed up as soon as possible.  Feel free to call the clinic should you have any questions or concerns at The clinic phone number is [phone].  Please show the [place] at check-in to the Emergency Department and triage nurse.

## 2020-02-25 ENCOUNTER — Ambulatory Visit: Payer: Medicare Other

## 2020-02-25 ENCOUNTER — Inpatient Hospital Stay: Payer: Medicare Other

## 2020-02-25 VITALS — BP 139/70 | HR 93 | Temp 98.5°F | Resp 18 | Ht 63.0 in

## 2020-02-25 DIAGNOSIS — D241 Benign neoplasm of right breast: Secondary | ICD-10-CM | POA: Diagnosis not present

## 2020-02-25 DIAGNOSIS — E876 Hypokalemia: Secondary | ICD-10-CM | POA: Diagnosis not present

## 2020-02-25 DIAGNOSIS — D649 Anemia, unspecified: Secondary | ICD-10-CM | POA: Diagnosis not present

## 2020-02-25 DIAGNOSIS — C3430 Malignant neoplasm of lower lobe, unspecified bronchus or lung: Secondary | ICD-10-CM

## 2020-02-25 DIAGNOSIS — E871 Hypo-osmolality and hyponatremia: Secondary | ICD-10-CM | POA: Diagnosis not present

## 2020-02-25 DIAGNOSIS — C3411 Malignant neoplasm of upper lobe, right bronchus or lung: Secondary | ICD-10-CM | POA: Diagnosis not present

## 2020-02-25 DIAGNOSIS — Z5189 Encounter for other specified aftercare: Secondary | ICD-10-CM | POA: Diagnosis not present

## 2020-02-25 DIAGNOSIS — J449 Chronic obstructive pulmonary disease, unspecified: Secondary | ICD-10-CM | POA: Diagnosis not present

## 2020-02-25 DIAGNOSIS — Z5111 Encounter for antineoplastic chemotherapy: Secondary | ICD-10-CM | POA: Diagnosis not present

## 2020-02-25 DIAGNOSIS — E222 Syndrome of inappropriate secretion of antidiuretic hormone: Secondary | ICD-10-CM

## 2020-02-25 DIAGNOSIS — Z85038 Personal history of other malignant neoplasm of large intestine: Secondary | ICD-10-CM | POA: Diagnosis not present

## 2020-02-25 MED ORDER — PEGFILGRASTIM-CBQV 6 MG/0.6ML ~~LOC~~ SOSY
6.0000 mg | PREFILLED_SYRINGE | Freq: Once | SUBCUTANEOUS | Status: AC
  Administered 2020-02-25: 6 mg via SUBCUTANEOUS

## 2020-02-25 MED ORDER — PEGFILGRASTIM-CBQV 6 MG/0.6ML ~~LOC~~ SOSY
PREFILLED_SYRINGE | SUBCUTANEOUS | Status: AC
  Filled 2020-02-25: qty 0.6

## 2020-02-25 NOTE — Progress Notes (Signed)
PT STABLE AT TIME OF DISCHARGE 

## 2020-02-25 NOTE — Patient Instructions (Signed)

## 2020-02-29 ENCOUNTER — Other Ambulatory Visit: Payer: Self-pay | Admitting: Pharmacist

## 2020-02-29 ENCOUNTER — Other Ambulatory Visit: Payer: Self-pay | Admitting: Oncology

## 2020-02-29 DIAGNOSIS — C3432 Malignant neoplasm of lower lobe, left bronchus or lung: Secondary | ICD-10-CM

## 2020-02-29 DIAGNOSIS — J449 Chronic obstructive pulmonary disease, unspecified: Secondary | ICD-10-CM | POA: Diagnosis not present

## 2020-03-01 ENCOUNTER — Inpatient Hospital Stay: Payer: Medicare Other | Admitting: Oncology

## 2020-03-01 ENCOUNTER — Inpatient Hospital Stay: Payer: Medicare Other

## 2020-03-02 NOTE — Progress Notes (Addendum)
[name]  [address] [place],  [place]  [address] [phone]  Clinic Day:  03/03/2020  Referring physician: Jeanie Sewer, NP   This document serves as a record of services personally performed by Hosie Poisson, MD. It was created on their behalf by Curry,Lauren E, a trained medical scribe. The creation of this record is based on the scribe's personal observations and the provider's statements to them.   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 female with stage IIB non-small-cell lung cancer of the right upper lobe diagnosed in November 2010.  She had a large endobronchial component causing obstruction, so underwent laser therapy at [facility] with a good response.  Due to severe emphysema and the location of the tumor being within 2 cm of the carina, she was not a surgical candidate.  She was treated with concurrent radiation and chemotherapy with carboplatin and paclitaxel, followed by an additional 2 months of carboplatin and paclitaxel with a good response.  She had bilateral pneumonia in December 2012.  CT scan at that time revealed some abnormality concerning for recurrence, so a PET scan was obtained in January 2013.  There was mild hypermetabolic activity in the right hilum, but the patient opted for observation.  Repeat CT scans had been stable, with scarring of the posterior right upper lobe.  She also has a history of a stage I adenocarcinoma of the colon, arising from a tubulovillous adenoma.  Her colonoscopy in August 2012 had removal of multiple benign polyps.  Her last mammogram was in December 2017.  She had her Port-A-Cath removed in May 2018. She quit smoking about 11 years ago.  CT chest in December 2018 revealed progression of amorphous soft tissue attenuation of the right  hilum.  There was no definite lymphadenopathy.  There were stable scattered tiny pulmonary nodules all less than 5 mm.  There is evidence of emphysema.  There is a stable right liver lesion measuring 2.5 cm which was unchanged for over 5 years.  CT chest in March of 2019 and September of 2019 were stable, with soft tissue attenuation is in the area of previous radiation.  CT chest in October of 2020 revealed all previously noted pulmonary nodules measuring  4 mm or less in size are stable compared to the prior examination, and are considered definitively benign. She also underwent bilateral diagnostic mammogram and right breast ultrasound which revealed the likely benign mass in the right breast at 2:30 is stable. CEA had decreased from 4.6 to 4.4.    She was referred back from [facility] in October 2021 after diagnosis of a new small cell lung cancer.  She presented with hemoptysis and pain of the left lower thorax.  CT revealed tumor encasing and narrowing the left lower lobe pulmonary artery and the left lower lobe bronchus.  There is a mass of the left hilum measuring 5.2 cm with left lower lobe atelectasis.  She also has moderate emphysema and mild cardiomegaly, with evidence of coronary artery disease.  Biopsy on October 19th revealed small cell carcinoma with crush artifact.  She had associated hyponatremia as low as 125, and required demeclocycline.  She was also placed on prednisone, 40 mg daily for 5 days, then 20 mg daily.  She complains of occasional cough productive of creamy  colored sputum and occasional wheezing but no further hemoptysis.  She is using a lidoderm patch for her left sided chest pain with partial relief.  She had been hospitalized several times in August of this year for hyponatremia before her new lung cancer was diagnosed.  In retrospect, the left hilar cancer was not obvious on chest x-ray.  She was found to be iron deficient and her potassium was down to 3.0 at  he last visit.  Baseline CEA was 14.1.   INTERVAL HISTORY:  She is here for follow up and supportive care.  She notes fatigue, and poor appetite with a 15 pound weight loss in an 8 day period.  Her shortness of breath is stable, and she remains on 4 L of oxygen per nasal cannula.  She has completed 2 cycles of chemotherapy and has had expected toxicities.  She complains of feeling especially drowsy.  Her white count is 2.6 down from 16.2, hemoglobin is 8.6, but fairly stable, platelets are 67,000 down from 555,000.  Chemistries are remarkable for a potassium of 3.4, improved, sodium has improved to 136, but magnesium is low at 1.3.  She is taking some potassium but admits that she has been occasionally noncompliant with her medications.  She denies fever, chills or other signs of infection.  She denies nausea, vomiting, bowel issues, or abdominal pain.  She denies sore throat, cough, dyspnea, or chest pain.  REVIEW OF SYSTEMS:  Review of Systems  Constitutional: Positive for appetite change (poor), fatigue and unexpected weight change (15 pound loss in an 8 day period).  HENT:  Negative.   Eyes: Negative.   Respiratory: Positive for shortness of breath (stable, on 4 L of oxygen).   Cardiovascular: Negative.   Gastrointestinal: Negative.   Endocrine: Negative.   Genitourinary: Negative.    Musculoskeletal: Negative.   Skin: Negative.   Neurological: Negative.   Hematological: Negative.   Psychiatric/Behavioral: Negative.      VITALS:  Blood pressure 123/88, pulse 94, temperature 98.8 F (37.1 C), temperature source Oral, resp. rate 18, height 5\' 3"  (1.6 m), weight 214 lb 6.4 oz (97.3 kg), SpO2 98 %.  Wt Readings from Last 3 Encounters:  03/03/20 214 lb 6.4 oz (97.3 kg)  02/24/20 230 lb (104.3 kg)  02/22/20 218 lb 1.6 oz (98.9 kg)    Body mass index is 37.98 kg/m.  Performance status (ECOG): 3 - Symptomatic, >50% confined to bed  PHYSICAL EXAM:  Physical Exam Constitutional:       General: She is not in acute distress.    Appearance: Normal appearance. She is normal weight.  HENT:     Head: Normocephalic and atraumatic.  Eyes:     General: No scleral icterus.    Extraocular Movements: Extraocular movements intact.     Conjunctiva/sclera: Conjunctivae normal.     Pupils: Pupils are equal, round, and reactive to light.  Cardiovascular:     Rate and Rhythm: Normal rate and regular rhythm.     Pulses: Normal pulses.     Heart sounds: Normal heart sounds. No murmur heard. No friction rub. No gallop.   Pulmonary:     Effort: Pulmonary effort is normal. No respiratory distress.     Breath sounds: Decreased breath sounds (bilateral bases) present.  Abdominal:     General: Bowel sounds are normal. There is no distension.     Palpations: Abdomen is soft. There is no mass.     Tenderness: There is no abdominal tenderness.  Musculoskeletal:  General: Normal range of motion.     Cervical back: Normal range of motion and neck supple.     Right lower leg: No edema.     Left lower leg: No edema.  Lymphadenopathy:     Cervical: No cervical adenopathy.  Skin:    General: Skin is warm and dry.  Neurological:     General: No focal deficit present.     Mental Status: She is alert and oriented to person, place, and time. Mental status is at baseline.  Psychiatric:        Mood and Affect: Mood normal.        Behavior: Behavior normal.        Thought Content: Thought content normal.        Judgment: Judgment normal.      LABS:   CBC Latest Ref Rng & Units 02/16/2020 01/26/2020 03/31/2011  WBC - 10.7 9.9 8.6  Hemoglobin 12.0 - 16.0 10.0(A) 11.4(L) 9.6(L)  Hematocrit 36 - 46 31(A) 36.8 29.5(L)  Platelets 150 - 399 266 270 176   CMP Latest Ref Rng & Units 02/16/2020 01/26/2020 01/20/2020  Glucose 70 - 99 mg/dL - 111(H) -  BUN 4 - 21 15 21  -  Creatinine 0.5 - 1.1 0.8 0.86 0.7  Sodium 137 - 147 130(A) 136 -  Potassium 3.4 - 5.3 3.2(A) 3.4(L) -  Chloride 99 - 108  88(A) 99 -  CO2 13 - 22 30(A) 25 -  Calcium 8.7 - 10.7 9.0 8.7(L) -  Total Protein 6.5 - 8.1 g/dL - 6.8 -  Total Bilirubin 0.3 - 1.2 mg/dL - 0.7 -  Alkaline Phos 25 - 125 159(A) 105 -  AST 13 - 35 36(A) 20 -  ALT 7 - 35 60(A) 25 -     No results found for: CEA1 / No results found for: CEA1   Lab Results  Component Value Date   TOTALPROTELP 4.7 (L) 03/26/2011   ALBUMINELP 52.5 (L) 03/26/2011   A1GS 8.7 (H) 03/26/2011   A2GS 18.8 (H) 03/26/2011   BETS 4.5 (L) 03/26/2011   BETA2SER 4.7 03/26/2011   GAMS 10.8 (L) 03/26/2011   MSPIKE NOT DETECTED 03/26/2011   SPEI (NOTE) 03/26/2011   Lab Results  Component Value Date   TIBC 219 (L) 03/29/2011   FERRITIN 356 (H) 03/29/2011   IRONPCTSAT 65 (H) 03/29/2011   Lab Results  Component Value Date   LDH 349 (H) 03/26/2011     STUDIES:  No results found.   Allergies:  Allergies  Allergen Reactions  . Nitroglycerin In D5w Other (See Comments)    "flatlines"  . Nitroglycerin Other (See Comments)    Drops her BP 'flatlines'     Current Medications: Current Outpatient Medications  Medication Sig Dispense Refill  . albuterol (PROVENTIL) (5 MG/ML) 0.5% nebulizer solution Take 2.5 mg by nebulization daily.    . budesonide-formoterol (SYMBICORT) 160-4.5 MCG/ACT inhaler Inhale 2 puffs into the lungs 2 (two) times daily.      . Calcium Carbonate-Vitamin D (CALCIUM + D PO) Take 1 tablet by mouth daily.      . calcium citrate-vitamin D (CITRACAL+D) 315-200 MG-UNIT tablet Take 1 tablet by mouth 2 (two) times daily.    . Cholecalciferol 25 MCG (1000 UT) tablet Take by mouth.    . cyclobenzaprine (FLEXERIL) 10 MG tablet Take by mouth.    . diltiazem (CARDIZEM CD) 120 MG 24 hr capsule Take 120 mg by mouth daily.    . famotidine (PEPCID) 20  MG tablet Take 20 mg by mouth 2 (two) times daily.    . furosemide (LASIX) 20 MG tablet Take 20 mg by mouth as needed.    . gabapentin (NEURONTIN) 300 MG capsule Take 300 mg by mouth as needed.     Marland Kitchen guaiFENesin (MUCINEX) 600 MG 12 hr tablet Take 1 tablet by mouth every 12 (twelve) hours.    Marland Kitchen HYDROcodone-acetaminophen (NORCO/VICODIN) 5-325 MG tablet     . Levothyroxine Sodium 88 MCG CAPS Take 88 mcg by mouth daily.    Marland Kitchen omeprazole (PRILOSEC) 20 MG capsule Take 1 capsule (20 mg total) by mouth daily. 30 capsule 0  . ondansetron (ZOFRAN) 4 MG tablet Take 1 tablet (4 mg total) by mouth every 4 (four) hours as needed for nausea. 90 tablet 3  . OXYGEN Inhale 2 L into the lungs continuous.    . pantoprazole (PROTONIX) 20 MG tablet Take by mouth.    . potassium chloride (KLOR-CON) 10 MEQ tablet Take 10 mEq by mouth daily.    . potassium chloride SA (KLOR-CON) 20 MEQ tablet Take 20 mEq by mouth 2 (two) times daily.    . predniSONE (DELTASONE) 10 MG tablet 50mg  po daily x 2 days, then 40mg  po daily x 2 days and continue decreasing by 10mg  every 2 days until finished. 30 tablet 0  . prochlorperazine (COMPAZINE) 10 MG tablet Take 1 tablet (10 mg total) by mouth every 6 (six) hours as needed for nausea or vomiting. 90 tablet 3  . sodium chloride 1 g tablet Take by mouth.    . Vitamin D, Ergocalciferol, (DRISDOL) 1.25 MG (50000 UNIT) CAPS capsule Take 50,000 Units by mouth once a week.     No current facility-administered medications for this visit.     ASSESSMENT & PLAN:   Assessment:   1. History of stage IIB non-small cell lung cancer in November 2010.  She remains without evidence of recurrent or metastatic disease.  2. History of stage I colon cancer.  She has not been compliant with follow-up colonoscopy.   3. Benign mass of the right breast at 2:30 which we will continue to monitor.  4. Severe oxygen dependant COPD.  5. 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  MRI of the brain was negative.  I am concerned that her CEA is rising and she does not seem clinically improved.  6. Hyponatremia secondary to SIADH,  improved.  7. Mild anemia, normochromic, normocytic.  Iron studies were consistent with deficiency.  This has worsened, but most likely secondary to chemotherapy.  She will probably require IV iron since we do not know what her compliance is with oral supplement.  This is much worse, but largely from chemotherapy since all of her blood counts are lower.  8. Hypokalemia. I recommend that she resume KCL 20 meq twice daily.  Plan: She started treatment with carboplatin/etoposide on November 3rd and has completed 2 cycles.  She now reports feeling poorly with weakness, fatigue, poor appetite and weight loss.  Her last CEA also increased from 14.1 to 48.3 which is concerning.  With her current performance status, I am concerned whether she could tolerate further treatment.  However, the patient wishes to proceed.  In view of her aggressive small cell lung cancer, this will be her only chance for survival.  We will schedule her for CT imaging prior to her appointment in January.  She knows to continue  her oral potassium and iron supplements as she has not been taking these routinely.  For now, I advise that she avoid any diuretic unless necessary.  Her anemia has also worsened, and so I recommend that she resume oral iron supplement daily.  We will see her back on December 16th with CBC, CMP, magnesium and type and hold for supportive care prior to her 3rd cycle.  We will then see her on January 6th with CBC, CMP, CEA and CT chest, abdomen and pelvis.  She does have a DNR in place.  The patient and her daughter understand the plans discussed today and are in agreement with them.  She knows to contact our office if she develops concerns regarding her lung cancer.   I provided 35 minutes of face-to-face time during this this encounter and > 50% was spent counseling as documented under my assessment and plan.      Derwood Kaplan, MD [facility] [address] [place] [address] Dept: [phone] Dept Fax: [phone]   I, Rita Ohara, am acting as scribe for Derwood Kaplan, MD  I have reviewed this report as typed by the medical scribe, and it is complete and accurate.  ADDENDUM:  Her magnesium is also low at 1.3 so I will order oral MagOx 400 mg daily and recheck it next week.Marland Kitchen

## 2020-03-03 ENCOUNTER — Encounter: Payer: Self-pay | Admitting: Oncology

## 2020-03-03 ENCOUNTER — Other Ambulatory Visit: Payer: Self-pay | Admitting: Hematology and Oncology

## 2020-03-03 ENCOUNTER — Inpatient Hospital Stay (INDEPENDENT_AMBULATORY_CARE_PROVIDER_SITE_OTHER): Payer: Medicare Other | Admitting: Oncology

## 2020-03-03 ENCOUNTER — Other Ambulatory Visit: Payer: Self-pay | Admitting: Oncology

## 2020-03-03 ENCOUNTER — Inpatient Hospital Stay: Payer: Medicare Other

## 2020-03-03 ENCOUNTER — Other Ambulatory Visit: Payer: Self-pay

## 2020-03-03 VITALS — BP 123/88 | HR 94 | Temp 98.8°F | Resp 18 | Ht 63.0 in | Wt 214.4 lb

## 2020-03-03 DIAGNOSIS — D61818 Other pancytopenia: Secondary | ICD-10-CM | POA: Diagnosis not present

## 2020-03-03 DIAGNOSIS — C3490 Malignant neoplasm of unspecified part of unspecified bronchus or lung: Secondary | ICD-10-CM

## 2020-03-03 DIAGNOSIS — C3432 Malignant neoplasm of lower lobe, left bronchus or lung: Secondary | ICD-10-CM

## 2020-03-03 DIAGNOSIS — C3491 Malignant neoplasm of unspecified part of right bronchus or lung: Secondary | ICD-10-CM | POA: Diagnosis not present

## 2020-03-03 DIAGNOSIS — Z0001 Encounter for general adult medical examination with abnormal findings: Secondary | ICD-10-CM | POA: Diagnosis not present

## 2020-03-03 LAB — BASIC METABOLIC PANEL
BUN: 10 (ref 4–21)
CO2: 31 — AB (ref 13–22)
Chloride: 95 — AB (ref 99–108)
Creatinine: 0.9 (ref 0.5–1.1)
Glucose: 142
Potassium: 3.4 (ref 3.4–5.3)
Sodium: 136 — AB (ref 137–147)

## 2020-03-03 LAB — HEPATIC FUNCTION PANEL
ALT: 23 (ref 7–35)
AST: 19 (ref 13–35)
Alkaline Phosphatase: 139 — AB (ref 25–125)
Bilirubin, Total: 0.6

## 2020-03-03 LAB — COMPREHENSIVE METABOLIC PANEL
Albumin: 3.9 (ref 3.5–5.0)
Calcium: 8.7 (ref 8.7–10.7)

## 2020-03-03 LAB — CBC AND DIFFERENTIAL
HCT: 25 — AB (ref 36–46)
Hemoglobin: 8.3 — AB (ref 12.0–16.0)
Platelets: 67 — AB (ref 150–399)
WBC: 2.6

## 2020-03-03 LAB — CBC: RBC: 3.05 — AB (ref 3.87–5.11)

## 2020-03-03 MED ORDER — MAGNESIUM OXIDE 400 (241.3 MG) MG PO TABS: 400.0000 mg | ORAL_TABLET | Freq: Every day | ORAL | 5 refills | Status: DC

## 2020-03-06 NOTE — Progress Notes (Signed)
[facility]  [address] [place],    [address] [phone]  Clinic Day:  03/10/2020  Referring physician: Jeanie Sewer, NP    CHIEF COMPLAINT:  CC:  Small cell carcinoma of the left hilar area   Current Treatment:  Carboplatin/etoposide   HISTORY OF PRESENT ILLNESS:  Gabrielle Hudson is a 79 y.o. female with stage IIB non-small-cell lung cancer of the right upper lobe diagnosed in November 2010.  She had a large endobronchial component causing obstruction, so underwent laser therapy at [facility] with a good response.  Due to severe emphysema and the location of the tumor being within 2 cm of the carina, she was not a surgical candidate.  She was treated with concurrent radiation and chemotherapy with carboplatin and paclitaxel, followed by an additional 2 months of carboplatin and paclitaxel with a good response.  She had bilateral pneumonia in December 2012.  CT scan at that time revealed some abnormality concerning for recurrence, so a PET scan was obtained in January 2013.  There was mild hypermetabolic activity in the right hilum, but the patient opted for observation.  Repeat CT scans had been stable, with scarring of the posterior right upper lobe.  She also has a history of a stage I adenocarcinoma of the colon, arising from a tubulovillous adenoma.  Her colonoscopy in August 2012 had removal of multiple benign polyps.  Her last mammogram was in December 2017.  She had her Port-A-Cath removed in May 2018. She quit smoking about 11 years ago.  CT chest in December 2018 revealed progression of amorphous soft tissue attenuation of the right hilum.  There was no definite lymphadenopathy.  There were stable scattered tiny pulmonary nodules all less than 5 mm.  There is evidence of emphysema.  There is a stable right liver lesion measuring 2.5 cm which was unchanged for over 5 years.  CT chest in March of 2019 and  September of 2019 were stable, with soft tissue attenuation is in the area of previous radiation.  CT chest in October of 2020 revealed all previously noted pulmonary nodules measuring  4 mm or less in size are stable compared to the prior examination, and are considered definitively benign. She also underwent bilateral diagnostic mammogram and right breast ultrasound which revealed the likely benign mass in the right breast at 2:30 is stable. CEA had decreased from 4.6 to 4.4.    She was referred back from [facility] in October 2021 after diagnosis of a new small cell lung cancer.  She presented with hemoptysis and pain of the left lower thorax.  CT revealed tumor encasing and narrowing the left lower lobe pulmonary artery and the left lower lobe bronchus.  There is a mass of the left hilum measuring 5.2 cm with left lower lobe atelectasis.  She also has moderate emphysema and mild cardiomegaly, with evidence of coronary artery disease.  Biopsy on October 19th revealed small cell carcinoma with crush artifact.  She had associated hyponatremia as low as 125, and required demeclocycline.  She was also placed on prednisone, 40 mg daily for 5 days, then 20 mg daily.  She complains of occasional cough productive of creamy colored sputum and occasional wheezing but no further hemoptysis.  She is using a lidoderm patch for her left sided chest pain with partial relief.  She had been hospitalized several times in August of this year for hyponatremia before her new lung cancer was diagnosed.  In retrospect, the left hilar cancer was not obvious on chest x-ray.  She was found to be iron deficient and her potassium was down to 3.0 at he last visit.  Baseline CEA was 14.1.   INTERVAL HISTORY:  She is here for follow up and evaluation prior to a 3rd cycle of carbo/etoposide. She reports feeling better since last visit. She continues to have shortness of breath and cough, although it is no worse. Her  appetite is better and she has gained one pound since last week. She denies fever, chills, nausea or vomiting. She denies issue with bowel or bladder. CBC today reveals WBC 32.7 and Hgb 8.8 today. Elevated white count today is most likely due to pegfilgrastim injection as there are no signs or symptoms of infection. Magnesium remains low at 1.2. She states she was unaware of needing to take daily supplement and will start today.  REVIEW OF SYSTEMS:  Review of Systems  Constitutional: Positive for fatigue. Negative for appetite change, chills, diaphoresis, fever and unexpected weight change.  HENT:   Negative for hearing loss, lump/mass, mouth sores, nosebleeds, sore throat, tinnitus, trouble swallowing and voice change.   Eyes: Negative for eye problems and icterus.  Respiratory: Positive for shortness of breath. Negative for chest tightness, cough, hemoptysis and wheezing.   Cardiovascular: Negative for chest pain, leg swelling and palpitations.  Gastrointestinal: Negative for abdominal distention, abdominal pain, blood in stool, constipation, diarrhea, nausea, rectal pain and vomiting.  Endocrine: Negative for hot flashes.  Genitourinary: Negative for bladder incontinence, difficulty urinating, dyspareunia, dysuria, frequency, hematuria, menstrual problem, nocturia, pelvic pain, vaginal bleeding and vaginal discharge.   Musculoskeletal: Negative for arthralgias, back pain, flank pain, gait problem, myalgias, neck pain and neck stiffness.  Skin: Negative for itching, rash and wound.  Neurological: Negative for dizziness, extremity weakness, gait problem, headaches, light-headedness, numbness, seizures and speech difficulty.  Hematological: Negative for adenopathy. Does not bruise/bleed easily.  Psychiatric/Behavioral: Negative for confusion, decreased concentration, depression, sleep disturbance and suicidal ideas. The patient is not nervous/anxious.      VITALS:  Blood pressure 120/86, pulse  (!) 110, temperature 98.7 F (37.1 C), temperature source Oral, resp. rate 20, height 5\' 3"  (1.6 m), weight 215 lb 9.6 oz (97.8 kg), SpO2 98 %, peak flow (!) 4 L/min.  Wt Readings from Last 3 Encounters:  03/09/20 215 lb 9.6 oz (97.8 kg)  03/03/20 214 lb 6.4 oz (97.3 kg)  02/24/20 230 lb (104.3 kg)    Body mass index is 38.19 kg/m.  Performance status (ECOG): 3 - Symptomatic, >50% confined to bed  PHYSICAL EXAM:  Physical Exam Constitutional:      General: She is not in acute distress.    Appearance: Normal appearance. She is normal weight. She is not ill-appearing, toxic-appearing or diaphoretic.  HENT:     Head: Normocephalic and atraumatic.     Right Ear: Tympanic membrane normal.     Left Ear: Tympanic membrane normal.     Nose: Nose normal. No congestion or rhinorrhea.     Mouth/Throat:     Mouth: Mucous membranes are moist.     Pharynx: Oropharynx is clear. No oropharyngeal exudate or posterior oropharyngeal erythema.  Eyes:     General: No scleral icterus.       Right eye: No discharge.        Left eye: No discharge.     Extraocular Movements: Extraocular movements intact.     Conjunctiva/sclera: Conjunctivae normal.     Pupils: Pupils are  equal, round, and reactive to light.  Neck:     Vascular: No carotid bruit.  Cardiovascular:     Rate and Rhythm: Normal rate and regular rhythm.     Heart sounds: No murmur heard. No friction rub. No gallop.   Pulmonary:     Effort: Pulmonary effort is normal. No respiratory distress.     Breath sounds: Normal breath sounds. No stridor. No wheezing, rhonchi or rales.  Chest:     Chest wall: No tenderness.  Abdominal:     General: Abdomen is flat. Bowel sounds are normal. There is no distension.     Palpations: There is no mass.     Tenderness: There is no abdominal tenderness. There is no right CVA tenderness, left CVA tenderness, guarding or rebound.     Hernia: No hernia is present.  Musculoskeletal:        General: No  swelling, tenderness, deformity or signs of injury. Normal range of motion.     Cervical back: Normal range of motion and neck supple. No rigidity or tenderness.     Right lower leg: No edema.     Left lower leg: No edema.  Lymphadenopathy:     Cervical: No cervical adenopathy.  Skin:    General: Skin is warm and dry.     Capillary Refill: Capillary refill takes less than 2 seconds.     Coloration: Skin is not jaundiced or pale.     Findings: No bruising, erythema, lesion or rash.  Neurological:     General: No focal deficit present.     Mental Status: She is alert and oriented to person, place, and time. Mental status is at baseline.     Cranial Nerves: No cranial nerve deficit.     Sensory: No sensory deficit.     Motor: No weakness.     Coordination: Coordination normal.     Gait: Gait normal.     Deep Tendon Reflexes: Reflexes normal.  Psychiatric:        Mood and Affect: Mood normal.        Behavior: Behavior normal.        Thought Content: Thought content normal.        Judgment: Judgment normal.      LABS:   CBC Latest Ref Rng & Units 03/09/2020 03/03/2020 02/16/2020  WBC - 32.7 2.6 10.7  Hemoglobin 12.0 - 16.0 8.8(A) 8.3(A) 10.0(A)  Hematocrit 36 - 46 28(A) 25(A) 31(A)  Platelets 150 - 399 198 67(A) 266   CMP Latest Ref Rng & Units 03/09/2020 03/03/2020 02/16/2020  Glucose 70 - 99 mg/dL - - -  BUN 4 - 21 13 10 15   Creatinine 0.5 - 1.1 1.0 0.9 0.8  Sodium 137 - 147 138 136(A) 130(A)  Potassium 3.4 - 5.3 4.0 3.4 3.2(A)  Chloride 99 - 108 98(A) 95(A) 88(A)  CO2 13 - 22 28(A) 31(A) 30(A)  Calcium 8.7 - 10.7 8.1(A) 8.7 9.0  Total Protein 6.5 - 8.1 g/dL - - -  Total Bilirubin 0.3 - 1.2 mg/dL - - -  Alkaline Phos 25 - 125 189(A) 139(A) 159(A)  AST 13 - 35 39(A) 19 36(A)  ALT 7 - 35 35 23 60(A)     No results found for: CEA1 / No results found for: CEA1   Lab Results  Component Value Date   TOTALPROTELP 4.7 (L) 03/26/2011   ALBUMINELP 52.5 (L) 03/26/2011    A1GS 8.7 (H) 03/26/2011   A2GS 18.8 (H) 03/26/2011  BETS 4.5 (L) 03/26/2011   BETA2SER 4.7 03/26/2011   GAMS 10.8 (L) 03/26/2011   MSPIKE NOT DETECTED 03/26/2011   SPEI (NOTE) 03/26/2011   Lab Results  Component Value Date   TIBC 219 (L) 03/29/2011   FERRITIN 356 (H) 03/29/2011   IRONPCTSAT 65 (H) 03/29/2011   Lab Results  Component Value Date   LDH 349 (H) 03/26/2011     STUDIES:  No results found.   Allergies:  Allergies  Allergen Reactions  . Nitroglycerin In D5w Other (See Comments)    "flatlines"  . Nitroglycerin Other (See Comments)    Drops her BP 'flatlines'     Current Medications: Current Outpatient Medications  Medication Sig Dispense Refill  . albuterol (PROVENTIL) (5 MG/ML) 0.5% nebulizer solution Take 2.5 mg by nebulization daily.    . budesonide-formoterol (SYMBICORT) 160-4.5 MCG/ACT inhaler Inhale 2 puffs into the lungs 2 (two) times daily.      . Calcium Carbonate-Vitamin D (CALCIUM + D PO) Take 1 tablet by mouth daily.      . calcium citrate-vitamin D (CITRACAL+D) 315-200 MG-UNIT tablet Take 1 tablet by mouth 2 (two) times daily.    . Cholecalciferol 25 MCG (1000 UT) tablet Take by mouth.    . cyclobenzaprine (FLEXERIL) 10 MG tablet Take by mouth.    . diltiazem (CARDIZEM CD) 120 MG 24 hr capsule Take 120 mg by mouth daily.    . famotidine (PEPCID) 20 MG tablet Take 20 mg by mouth 2 (two) times daily.    . furosemide (LASIX) 20 MG tablet Take 20 mg by mouth as needed.    . gabapentin (NEURONTIN) 300 MG capsule Take 300 mg by mouth as needed.    Marland Kitchen guaiFENesin (MUCINEX) 600 MG 12 hr tablet Take 1 tablet by mouth every 12 (twelve) hours.    Marland Kitchen HYDROcodone-acetaminophen (NORCO/VICODIN) 5-325 MG tablet     . Levothyroxine Sodium 88 MCG CAPS Take 88 mcg by mouth daily.    . magnesium oxide (MAG-OX) 400 (241.3 Mg) MG tablet Take 1 tablet (400 mg total) by mouth daily. 30 tablet 5  . omeprazole (PRILOSEC) 20 MG capsule Take 1 capsule (20 mg total) by mouth  daily. 30 capsule 0  . ondansetron (ZOFRAN) 4 MG tablet Take 1 tablet (4 mg total) by mouth every 4 (four) hours as needed for nausea. 90 tablet 3  . OXYGEN Inhale 2 L into the lungs continuous.    . pantoprazole (PROTONIX) 20 MG tablet Take by mouth.    . potassium chloride (KLOR-CON) 10 MEQ tablet Take 10 mEq by mouth daily.    . potassium chloride SA (KLOR-CON) 20 MEQ tablet Take 20 mEq by mouth 2 (two) times daily.    . predniSONE (DELTASONE) 10 MG tablet 50mg  po daily x 2 days, then 40mg  po daily x 2 days and continue decreasing by 10mg  every 2 days until finished. 30 tablet 0  . prochlorperazine (COMPAZINE) 10 MG tablet Take 1 tablet (10 mg total) by mouth every 6 (six) hours as needed for nausea or vomiting. 90 tablet 3  . sodium chloride 1 g tablet Take by mouth.    . Vitamin D, Ergocalciferol, (DRISDOL) 1.25 MG (50000 UNIT) CAPS capsule Take 50,000 Units by mouth once a week.     No current facility-administered medications for this visit.     ASSESSMENT & PLAN:   Assessment:   1. History of stage IIB non-small cell lung cancer in November 2010.  She remains without evidence of recurrent  or metastatic disease.  2. History of stage I colon cancer.  She has not been compliant with follow-up colonoscopy.   3. Benign mass of the right breast at 2:30 which we will continue to monitor.  4. Severe oxygen dependant COPD.  5. 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  MRI of the brain was negative.  I am concerned that her CEA is rising and she does not seem clinically improved.  6. Hyponatremia secondary to SIADH, improved.  7. Mild anemia, normochromic, normocytic.  Iron studies were consistent with deficiency.  This has worsened, but most likely secondary to chemotherapy.  She will probably require IV iron since we do not know what her compliance is with oral supplement.  This is much worse, but largely from  chemotherapy since all of her blood counts are lower. Stable today.  8. Hypokalemia. I recommend that she resume KCL 20 meq twice daily.  Plan: She started treatment with carboplatin/etoposide on November 3rd and has completed 2 cycles. Her last CEA  increased from 14.1 to 48.3 which is concerning. We will schedule her for CT imaging prior to her appointment in January.  She knows to continue her oral potassium and iron supplements as she has not been taking these routinely. She will start oral magnesium today, once daily. We will repeat her labs Monday morning prior to treatment to assess if potassium and magnesium replacement are necessary. We will also recheck her CBC to assess her WBC, if they remain elevated, we may skip pegfilgrastim this cycle.   I provided 20 minutes of face-to-face time during this this encounter and > 50% was spent counseling as documented under my assessment and plan.      [name], NP [facility] [address] [place] [address] Dept: [phone] Dept Fax: [phone]

## 2020-03-07 ENCOUNTER — Telehealth: Payer: Self-pay

## 2020-03-07 NOTE — Telephone Encounter (Signed)
-----   Message from Derwood Kaplan, MD sent at 03/03/2020  5:56 PM EST ----- Regarding: call pt Tell her magnesium is low also.  I sent in E Rx for supplement 1 daily

## 2020-03-08 ENCOUNTER — Telehealth: Payer: Self-pay

## 2020-03-08 NOTE — Telephone Encounter (Signed)
-----   Message from Derwood Kaplan, MD sent at 03/03/2020  5:56 PM EST ----- Regarding: call pt Tell her magnesium is low also.  I sent in E Rx for supplement 1 daily

## 2020-03-08 NOTE — Telephone Encounter (Signed)
Attempted multiple time to contact patient regarding this information. No answer and no detailed VM.

## 2020-03-09 ENCOUNTER — Inpatient Hospital Stay (INDEPENDENT_AMBULATORY_CARE_PROVIDER_SITE_OTHER): Payer: Medicare Other | Admitting: Hematology and Oncology

## 2020-03-09 ENCOUNTER — Other Ambulatory Visit: Payer: Self-pay | Admitting: Hematology and Oncology

## 2020-03-09 ENCOUNTER — Inpatient Hospital Stay: Payer: Medicare Other

## 2020-03-09 ENCOUNTER — Other Ambulatory Visit: Payer: Self-pay

## 2020-03-09 VITALS — BP 120/86 | HR 110 | Temp 98.7°F | Resp 20 | Ht 63.0 in | Wt 215.6 lb

## 2020-03-09 DIAGNOSIS — Z0001 Encounter for general adult medical examination with abnormal findings: Secondary | ICD-10-CM | POA: Diagnosis not present

## 2020-03-09 DIAGNOSIS — C3432 Malignant neoplasm of lower lobe, left bronchus or lung: Secondary | ICD-10-CM | POA: Diagnosis not present

## 2020-03-09 DIAGNOSIS — C3491 Malignant neoplasm of unspecified part of right bronchus or lung: Secondary | ICD-10-CM | POA: Diagnosis not present

## 2020-03-09 DIAGNOSIS — R6889 Other general symptoms and signs: Secondary | ICD-10-CM | POA: Diagnosis not present

## 2020-03-09 LAB — BASIC METABOLIC PANEL
BUN: 13 (ref 4–21)
CO2: 28 — AB (ref 13–22)
Chloride: 98 — AB (ref 99–108)
Creatinine: 1 (ref 0.5–1.1)
Glucose: 114
Potassium: 4 (ref 3.4–5.3)
Sodium: 138 (ref 137–147)

## 2020-03-09 LAB — MAGNESIUM: Magnesium: 1.2

## 2020-03-09 LAB — CBC AND DIFFERENTIAL
HCT: 28 — AB (ref 36–46)
Hemoglobin: 8.8 — AB (ref 12.0–16.0)
Neutrophils Absolute: 25.18
Platelets: 198 (ref 150–399)
WBC: 32.7

## 2020-03-09 LAB — CBC: RBC: 3.27 — AB (ref 3.87–5.11)

## 2020-03-09 LAB — COMPREHENSIVE METABOLIC PANEL
Albumin: 3.5 (ref 3.5–5.0)
Calcium: 8.1 — AB (ref 8.7–10.7)

## 2020-03-09 LAB — HEPATIC FUNCTION PANEL
ALT: 35 (ref 7–35)
AST: 39 — AB (ref 13–35)
Alkaline Phosphatase: 189 — AB (ref 25–125)
Bilirubin, Total: 0.6

## 2020-03-10 ENCOUNTER — Telehealth: Payer: Self-pay | Admitting: Oncology

## 2020-03-10 ENCOUNTER — Other Ambulatory Visit: Payer: Self-pay | Admitting: Oncology

## 2020-03-10 NOTE — Telephone Encounter (Signed)
03/10/20 lft msg-ct scans on 03/28/20@1pm 

## 2020-03-13 ENCOUNTER — Other Ambulatory Visit: Payer: Self-pay

## 2020-03-13 ENCOUNTER — Other Ambulatory Visit: Payer: Self-pay | Admitting: Hematology and Oncology

## 2020-03-13 ENCOUNTER — Inpatient Hospital Stay: Payer: Medicare Other

## 2020-03-13 VITALS — BP 141/71 | HR 105 | Temp 98.1°F | Resp 20 | Ht 63.0 in | Wt 224.8 lb

## 2020-03-13 DIAGNOSIS — E222 Syndrome of inappropriate secretion of antidiuretic hormone: Secondary | ICD-10-CM

## 2020-03-13 DIAGNOSIS — Z5189 Encounter for other specified aftercare: Secondary | ICD-10-CM | POA: Diagnosis not present

## 2020-03-13 DIAGNOSIS — Z5111 Encounter for antineoplastic chemotherapy: Secondary | ICD-10-CM | POA: Diagnosis not present

## 2020-03-13 DIAGNOSIS — J449 Chronic obstructive pulmonary disease, unspecified: Secondary | ICD-10-CM | POA: Diagnosis not present

## 2020-03-13 DIAGNOSIS — E876 Hypokalemia: Secondary | ICD-10-CM | POA: Diagnosis not present

## 2020-03-13 DIAGNOSIS — C3491 Malignant neoplasm of unspecified part of right bronchus or lung: Secondary | ICD-10-CM | POA: Diagnosis not present

## 2020-03-13 DIAGNOSIS — Z85038 Personal history of other malignant neoplasm of large intestine: Secondary | ICD-10-CM | POA: Diagnosis not present

## 2020-03-13 DIAGNOSIS — D649 Anemia, unspecified: Secondary | ICD-10-CM | POA: Diagnosis not present

## 2020-03-13 DIAGNOSIS — Z0001 Encounter for general adult medical examination with abnormal findings: Secondary | ICD-10-CM | POA: Diagnosis not present

## 2020-03-13 DIAGNOSIS — C3430 Malignant neoplasm of lower lobe, unspecified bronchus or lung: Secondary | ICD-10-CM

## 2020-03-13 DIAGNOSIS — C3411 Malignant neoplasm of upper lobe, right bronchus or lung: Secondary | ICD-10-CM | POA: Diagnosis not present

## 2020-03-13 DIAGNOSIS — E871 Hypo-osmolality and hyponatremia: Secondary | ICD-10-CM | POA: Diagnosis not present

## 2020-03-13 DIAGNOSIS — D241 Benign neoplasm of right breast: Secondary | ICD-10-CM | POA: Diagnosis not present

## 2020-03-13 MED ORDER — DIPHENHYDRAMINE HCL 50 MG/ML IJ SOLN
50.0000 mg | Freq: Once | INTRAMUSCULAR | Status: AC
  Administered 2020-03-13: 50 mg via INTRAVENOUS

## 2020-03-13 MED ORDER — SODIUM CHLORIDE 0.9 % IV SOLN
150.0000 mg | Freq: Once | INTRAVENOUS | Status: AC
  Administered 2020-03-13: 150 mg via INTRAVENOUS
  Filled 2020-03-13: qty 150

## 2020-03-13 MED ORDER — HEPARIN SOD (PORK) LOCK FLUSH 100 UNIT/ML IV SOLN
500.0000 [IU] | Freq: Once | INTRAVENOUS | Status: AC | PRN
  Administered 2020-03-13: 500 [IU]
  Filled 2020-03-13: qty 5

## 2020-03-13 MED ORDER — MAGNESIUM SULFATE 2 GM/50ML IV SOLN
2.0000 g | Freq: Once | INTRAVENOUS | Status: AC
  Administered 2020-03-13: 2 g via INTRAVENOUS

## 2020-03-13 MED ORDER — FAMOTIDINE IN NACL 20-0.9 MG/50ML-% IV SOLN
INTRAVENOUS | Status: AC
  Filled 2020-03-13: qty 50

## 2020-03-13 MED ORDER — SODIUM CHLORIDE 0.9 % IV SOLN
10.0000 mg | Freq: Once | INTRAVENOUS | Status: AC
  Administered 2020-03-13: 10 mg via INTRAVENOUS
  Filled 2020-03-13: qty 10

## 2020-03-13 MED ORDER — MAGNESIUM SULFATE 4 GM/100ML IV SOLN: 4.0000 g | Freq: Once | INTRAVENOUS | Status: DC

## 2020-03-13 MED ORDER — SODIUM CHLORIDE 0.9 % IV SOLN
Freq: Once | INTRAVENOUS | Status: AC
Start: 2020-03-13 — End: 2020-03-13
  Filled 2020-03-13: qty 250

## 2020-03-13 MED ORDER — SODIUM CHLORIDE 0.9 % IV SOLN
95.0000 mg/m2 | Freq: Once | INTRAVENOUS | Status: AC
  Administered 2020-03-13: 200 mg via INTRAVENOUS
  Filled 2020-03-13: qty 10

## 2020-03-13 MED ORDER — SODIUM CHLORIDE 0.9 % IV SOLN
504.0000 mg | Freq: Once | INTRAVENOUS | Status: AC
  Administered 2020-03-13: 500 mg via INTRAVENOUS
  Filled 2020-03-13: qty 50

## 2020-03-13 MED ORDER — HYDROCODONE-ACETAMINOPHEN 7.5-325 MG PO TABS: 1.0000 | ORAL_TABLET | ORAL | 0 refills | Status: DC | PRN

## 2020-03-13 MED ORDER — FAMOTIDINE IN NACL 20-0.9 MG/50ML-% IV SOLN
20.0000 mg | Freq: Once | INTRAVENOUS | Status: AC
  Administered 2020-03-13: 20 mg via INTRAVENOUS

## 2020-03-13 MED ORDER — PALONOSETRON HCL INJECTION 0.25 MG/5ML
0.2500 mg | Freq: Once | INTRAVENOUS | Status: AC
  Administered 2020-03-13: 0.25 mg via INTRAVENOUS

## 2020-03-13 MED ORDER — DIPHENHYDRAMINE HCL 50 MG/ML IJ SOLN
INTRAMUSCULAR | Status: AC
  Filled 2020-03-13: qty 1

## 2020-03-13 MED ORDER — PALONOSETRON HCL INJECTION 0.25 MG/5ML
INTRAVENOUS | Status: AC
  Filled 2020-03-13: qty 5

## 2020-03-13 NOTE — Progress Notes (Signed)
Saw patient in chemo infusion. Her magnesium is low today, we will give 4 grams. Her Hemoglobin is 7.2 today, patient states she is asymptomatic and does not wish to receive blood at this time. We will re-evaluate later this week.

## 2020-03-13 NOTE — Patient Instructions (Signed)
[place] Discharge Instructions for Patients Receiving Chemotherapy  Today you received the following chemotherapy agents etoposide and carboplatin.  To help prevent nausea and vomiting after your treatment, we encourage you to take your nausea medication.   If you develop nausea and vomiting that is not controlled by your nausea medication, call the clinic.   BELOW ARE SYMPTOMS THAT SHOULD BE REPORTED IMMEDIATELY:  *FEVER GREATER THAN 100.5 F  *CHILLS WITH OR WITHOUT FEVER  NAUSEA AND VOMITING THAT IS NOT CONTROLLED WITH YOUR NAUSEA MEDICATION  *UNUSUAL SHORTNESS OF BREATH  *UNUSUAL BRUISING OR BLEEDING  TENDERNESS IN MOUTH AND THROAT WITH OR WITHOUT PRESENCE OF ULCERS  *URINARY PROBLEMS  *BOWEL PROBLEMS  UNUSUAL RASH Items with * indicate a potential emergency and should be followed up as soon as possible.  Feel free to call the clinic should you have any questions or concerns at The clinic phone number is [phone].  Please show the [place] at check-in to the Emergency Department and triage nurse.

## 2020-03-14 ENCOUNTER — Inpatient Hospital Stay: Payer: Medicare Other

## 2020-03-14 VITALS — BP 125/69 | HR 102 | Temp 98.3°F | Resp 20 | Ht 63.0 in | Wt 224.0 lb

## 2020-03-14 DIAGNOSIS — E222 Syndrome of inappropriate secretion of antidiuretic hormone: Secondary | ICD-10-CM

## 2020-03-14 DIAGNOSIS — E876 Hypokalemia: Secondary | ICD-10-CM | POA: Diagnosis not present

## 2020-03-14 DIAGNOSIS — Z85038 Personal history of other malignant neoplasm of large intestine: Secondary | ICD-10-CM | POA: Diagnosis not present

## 2020-03-14 DIAGNOSIS — D649 Anemia, unspecified: Secondary | ICD-10-CM | POA: Diagnosis not present

## 2020-03-14 DIAGNOSIS — D241 Benign neoplasm of right breast: Secondary | ICD-10-CM | POA: Diagnosis not present

## 2020-03-14 DIAGNOSIS — J449 Chronic obstructive pulmonary disease, unspecified: Secondary | ICD-10-CM | POA: Diagnosis not present

## 2020-03-14 DIAGNOSIS — E871 Hypo-osmolality and hyponatremia: Secondary | ICD-10-CM | POA: Diagnosis not present

## 2020-03-14 DIAGNOSIS — C3430 Malignant neoplasm of lower lobe, unspecified bronchus or lung: Secondary | ICD-10-CM

## 2020-03-14 DIAGNOSIS — C3411 Malignant neoplasm of upper lobe, right bronchus or lung: Secondary | ICD-10-CM | POA: Diagnosis not present

## 2020-03-14 DIAGNOSIS — Z5111 Encounter for antineoplastic chemotherapy: Secondary | ICD-10-CM | POA: Diagnosis not present

## 2020-03-14 DIAGNOSIS — Z5189 Encounter for other specified aftercare: Secondary | ICD-10-CM | POA: Diagnosis not present

## 2020-03-14 MED ORDER — HEPARIN SOD (PORK) LOCK FLUSH 100 UNIT/ML IV SOLN
500.0000 [IU] | Freq: Once | INTRAVENOUS | Status: AC | PRN
  Administered 2020-03-14: 500 [IU]
  Filled 2020-03-14: qty 5

## 2020-03-14 MED ORDER — SODIUM CHLORIDE 0.9 % IV SOLN
95.0000 mg/m2 | Freq: Once | INTRAVENOUS | Status: AC
  Administered 2020-03-14: 200 mg via INTRAVENOUS
  Filled 2020-03-14: qty 10

## 2020-03-14 MED ORDER — SODIUM CHLORIDE 0.9 % IV SOLN
10.0000 mg | Freq: Once | INTRAVENOUS | Status: AC
  Administered 2020-03-14: 10 mg via INTRAVENOUS
  Filled 2020-03-14: qty 10

## 2020-03-14 MED ORDER — SODIUM CHLORIDE 0.9 % IV SOLN
Freq: Once | INTRAVENOUS | Status: AC
Start: 2020-03-14 — End: 2020-03-14
  Filled 2020-03-14: qty 250

## 2020-03-14 NOTE — Patient Instructions (Signed)
[place] Discharge Instructions for Patients Receiving Chemotherapy  Today you received the following chemotherapy agents ETOPOSIDE  To help prevent nausea and vomiting after your treatment, we encourage you to take your nausea medication.   If you develop nausea and vomiting that is not controlled by your nausea medication, call the clinic.   BELOW ARE SYMPTOMS THAT SHOULD BE REPORTED IMMEDIATELY:  *FEVER GREATER THAN 100.5 F  *CHILLS WITH OR WITHOUT FEVER  NAUSEA AND VOMITING THAT IS NOT CONTROLLED WITH YOUR NAUSEA MEDICATION  *UNUSUAL SHORTNESS OF BREATH  *UNUSUAL BRUISING OR BLEEDING  TENDERNESS IN MOUTH AND THROAT WITH OR WITHOUT PRESENCE OF ULCERS  *URINARY PROBLEMS  *BOWEL PROBLEMS  UNUSUAL RASH Items with * indicate a potential emergency and should be followed up as soon as possible.  Feel free to call the clinic should you have any questions or concerns at The clinic phone number is [phone].  Please show the [place] at check-in to the Emergency Department and triage nurse.

## 2020-03-15 ENCOUNTER — Inpatient Hospital Stay: Payer: Medicare Other

## 2020-03-15 ENCOUNTER — Other Ambulatory Visit: Payer: Self-pay

## 2020-03-15 ENCOUNTER — Encounter: Payer: Self-pay | Admitting: Oncology

## 2020-03-15 VITALS — BP 156/70 | HR 94 | Temp 98.5°F | Resp 20 | Ht 63.0 in | Wt 224.0 lb

## 2020-03-15 DIAGNOSIS — D649 Anemia, unspecified: Secondary | ICD-10-CM | POA: Diagnosis not present

## 2020-03-15 DIAGNOSIS — D241 Benign neoplasm of right breast: Secondary | ICD-10-CM | POA: Diagnosis not present

## 2020-03-15 DIAGNOSIS — Z85038 Personal history of other malignant neoplasm of large intestine: Secondary | ICD-10-CM | POA: Diagnosis not present

## 2020-03-15 DIAGNOSIS — C3411 Malignant neoplasm of upper lobe, right bronchus or lung: Secondary | ICD-10-CM | POA: Diagnosis not present

## 2020-03-15 DIAGNOSIS — R6889 Other general symptoms and signs: Secondary | ICD-10-CM | POA: Diagnosis not present

## 2020-03-15 DIAGNOSIS — E871 Hypo-osmolality and hyponatremia: Secondary | ICD-10-CM | POA: Diagnosis not present

## 2020-03-15 DIAGNOSIS — Z5111 Encounter for antineoplastic chemotherapy: Secondary | ICD-10-CM | POA: Diagnosis not present

## 2020-03-15 DIAGNOSIS — C3430 Malignant neoplasm of lower lobe, unspecified bronchus or lung: Secondary | ICD-10-CM

## 2020-03-15 DIAGNOSIS — J449 Chronic obstructive pulmonary disease, unspecified: Secondary | ICD-10-CM | POA: Diagnosis not present

## 2020-03-15 DIAGNOSIS — E876 Hypokalemia: Secondary | ICD-10-CM | POA: Diagnosis not present

## 2020-03-15 DIAGNOSIS — Z5189 Encounter for other specified aftercare: Secondary | ICD-10-CM | POA: Diagnosis not present

## 2020-03-15 DIAGNOSIS — E222 Syndrome of inappropriate secretion of antidiuretic hormone: Secondary | ICD-10-CM

## 2020-03-15 MED ORDER — SODIUM CHLORIDE 0.9 % IV SOLN
10.0000 mg | Freq: Once | INTRAVENOUS | Status: AC
  Administered 2020-03-15: 10 mg via INTRAVENOUS
  Filled 2020-03-15: qty 10

## 2020-03-15 MED ORDER — SODIUM CHLORIDE 0.9 % IV SOLN
Freq: Once | INTRAVENOUS | Status: AC
  Filled 2020-03-15: qty 250

## 2020-03-15 MED ORDER — SODIUM CHLORIDE 0.9 % IV SOLN
95.0000 mg/m2 | Freq: Once | INTRAVENOUS | Status: AC
  Administered 2020-03-15: 200 mg via INTRAVENOUS
  Filled 2020-03-15: qty 10

## 2020-03-15 MED ORDER — HEPARIN SOD (PORK) LOCK FLUSH 100 UNIT/ML IV SOLN
500.0000 [IU] | Freq: Once | INTRAVENOUS | Status: AC | PRN
  Administered 2020-03-15: 500 [IU]
  Filled 2020-03-15: qty 5

## 2020-03-15 NOTE — Patient Instructions (Signed)
[name] Discharge Instructions for Patients Receiving Chemotherapy  Today you received the following chemotherapy agents ETOPOSIDE  To help prevent nausea and vomiting after your treatment, we encourage you to take your nausea medication.   If you develop nausea and vomiting that is not controlled by your nausea medication, call the clinic.   BELOW ARE SYMPTOMS THAT SHOULD BE REPORTED IMMEDIATELY:  *FEVER GREATER THAN 100.5 F  *CHILLS WITH OR WITHOUT FEVER  NAUSEA AND VOMITING THAT IS NOT CONTROLLED WITH YOUR NAUSEA MEDICATION  *UNUSUAL SHORTNESS OF BREATH  *UNUSUAL BRUISING OR BLEEDING  TENDERNESS IN MOUTH AND THROAT WITH OR WITHOUT PRESENCE OF ULCERS  *URINARY PROBLEMS  *BOWEL PROBLEMS  UNUSUAL RASH Items with * indicate a potential emergency and should be followed up as soon as possible.  Feel free to call the clinic should you have any questions or concerns at The clinic phone number is [phone].  Please show the [name] at check-in to the Emergency Department and triage nurse.

## 2020-03-15 NOTE — Telephone Encounter (Signed)
DONE

## 2020-03-16 ENCOUNTER — Inpatient Hospital Stay: Payer: Medicare Other

## 2020-03-16 VITALS — BP 168/69 | HR 100 | Temp 98.4°F | Resp 20 | Ht 63.0 in | Wt 224.0 lb

## 2020-03-16 DIAGNOSIS — E871 Hypo-osmolality and hyponatremia: Secondary | ICD-10-CM

## 2020-03-16 DIAGNOSIS — Z85038 Personal history of other malignant neoplasm of large intestine: Secondary | ICD-10-CM | POA: Diagnosis not present

## 2020-03-16 DIAGNOSIS — E876 Hypokalemia: Secondary | ICD-10-CM | POA: Diagnosis not present

## 2020-03-16 DIAGNOSIS — C3411 Malignant neoplasm of upper lobe, right bronchus or lung: Secondary | ICD-10-CM | POA: Diagnosis not present

## 2020-03-16 DIAGNOSIS — D241 Benign neoplasm of right breast: Secondary | ICD-10-CM | POA: Diagnosis not present

## 2020-03-16 DIAGNOSIS — D649 Anemia, unspecified: Secondary | ICD-10-CM | POA: Diagnosis not present

## 2020-03-16 DIAGNOSIS — J449 Chronic obstructive pulmonary disease, unspecified: Secondary | ICD-10-CM | POA: Diagnosis not present

## 2020-03-16 DIAGNOSIS — H4322 Crystalline deposits in vitreous body, left eye: Secondary | ICD-10-CM | POA: Diagnosis not present

## 2020-03-16 DIAGNOSIS — H353132 Nonexudative age-related macular degeneration, bilateral, intermediate dry stage: Secondary | ICD-10-CM | POA: Diagnosis not present

## 2020-03-16 DIAGNOSIS — C3430 Malignant neoplasm of lower lobe, unspecified bronchus or lung: Secondary | ICD-10-CM

## 2020-03-16 DIAGNOSIS — Z5189 Encounter for other specified aftercare: Secondary | ICD-10-CM | POA: Diagnosis not present

## 2020-03-16 DIAGNOSIS — E222 Syndrome of inappropriate secretion of antidiuretic hormone: Secondary | ICD-10-CM

## 2020-03-16 DIAGNOSIS — H4720 Unspecified optic atrophy: Secondary | ICD-10-CM | POA: Diagnosis not present

## 2020-03-16 DIAGNOSIS — Z5111 Encounter for antineoplastic chemotherapy: Secondary | ICD-10-CM | POA: Diagnosis not present

## 2020-03-16 MED ORDER — PEGFILGRASTIM-CBQV 6 MG/0.6ML ~~LOC~~ SOSY
PREFILLED_SYRINGE | SUBCUTANEOUS | Status: AC
  Filled 2020-03-16: qty 0.6

## 2020-03-16 MED ORDER — PEGFILGRASTIM-CBQV 6 MG/0.6ML ~~LOC~~ SOSY
6.0000 mg | PREFILLED_SYRINGE | Freq: Once | SUBCUTANEOUS | Status: AC
  Administered 2020-03-16: 6 mg via SUBCUTANEOUS

## 2020-03-16 NOTE — Patient Instructions (Signed)

## 2020-03-19 DIAGNOSIS — J45998 Other asthma: Secondary | ICD-10-CM | POA: Diagnosis not present

## 2020-03-20 ENCOUNTER — Encounter: Payer: Self-pay | Admitting: Oncology

## 2020-03-20 DIAGNOSIS — R42 Dizziness and giddiness: Secondary | ICD-10-CM | POA: Diagnosis not present

## 2020-03-20 DIAGNOSIS — R55 Syncope and collapse: Secondary | ICD-10-CM | POA: Diagnosis not present

## 2020-03-20 DIAGNOSIS — K5730 Diverticulosis of large intestine without perforation or abscess without bleeding: Secondary | ICD-10-CM | POA: Diagnosis not present

## 2020-03-20 DIAGNOSIS — M4802 Spinal stenosis, cervical region: Secondary | ICD-10-CM | POA: Diagnosis not present

## 2020-03-20 DIAGNOSIS — R531 Weakness: Secondary | ICD-10-CM | POA: Diagnosis not present

## 2020-03-20 DIAGNOSIS — D649 Anemia, unspecified: Secondary | ICD-10-CM | POA: Diagnosis not present

## 2020-03-20 DIAGNOSIS — Z743 Need for continuous supervision: Secondary | ICD-10-CM | POA: Diagnosis not present

## 2020-03-20 DIAGNOSIS — R000 Tachycardia, unspecified: Secondary | ICD-10-CM | POA: Diagnosis not present

## 2020-03-20 DIAGNOSIS — R0902 Hypoxemia: Secondary | ICD-10-CM | POA: Diagnosis not present

## 2020-03-20 DIAGNOSIS — J432 Centrilobular emphysema: Secondary | ICD-10-CM | POA: Diagnosis not present

## 2020-03-20 DIAGNOSIS — M4804 Spinal stenosis, thoracic region: Secondary | ICD-10-CM | POA: Diagnosis not present

## 2020-03-20 DIAGNOSIS — M47812 Spondylosis without myelopathy or radiculopathy, cervical region: Secondary | ICD-10-CM | POA: Diagnosis not present

## 2020-03-20 DIAGNOSIS — M48061 Spinal stenosis, lumbar region without neurogenic claudication: Secondary | ICD-10-CM | POA: Diagnosis not present

## 2020-03-20 DIAGNOSIS — C3490 Malignant neoplasm of unspecified part of unspecified bronchus or lung: Secondary | ICD-10-CM | POA: Diagnosis not present

## 2020-03-20 DIAGNOSIS — N390 Urinary tract infection, site not specified: Secondary | ICD-10-CM | POA: Diagnosis not present

## 2020-03-20 DIAGNOSIS — I2694 Multiple subsegmental pulmonary emboli without acute cor pulmonale: Secondary | ICD-10-CM | POA: Diagnosis not present

## 2020-03-20 DIAGNOSIS — C189 Malignant neoplasm of colon, unspecified: Secondary | ICD-10-CM | POA: Diagnosis not present

## 2020-03-21 DIAGNOSIS — J432 Centrilobular emphysema: Secondary | ICD-10-CM | POA: Diagnosis not present

## 2020-03-21 DIAGNOSIS — R739 Hyperglycemia, unspecified: Secondary | ICD-10-CM | POA: Diagnosis not present

## 2020-03-21 DIAGNOSIS — M4802 Spinal stenosis, cervical region: Secondary | ICD-10-CM | POA: Diagnosis not present

## 2020-03-21 DIAGNOSIS — M48061 Spinal stenosis, lumbar region without neurogenic claudication: Secondary | ICD-10-CM | POA: Diagnosis not present

## 2020-03-21 DIAGNOSIS — I2694 Multiple subsegmental pulmonary emboli without acute cor pulmonale: Secondary | ICD-10-CM | POA: Diagnosis not present

## 2020-03-21 DIAGNOSIS — C3490 Malignant neoplasm of unspecified part of unspecified bronchus or lung: Secondary | ICD-10-CM | POA: Diagnosis not present

## 2020-03-21 DIAGNOSIS — D701 Agranulocytosis secondary to cancer chemotherapy: Secondary | ICD-10-CM | POA: Diagnosis not present

## 2020-03-21 DIAGNOSIS — D61810 Antineoplastic chemotherapy induced pancytopenia: Secondary | ICD-10-CM | POA: Diagnosis not present

## 2020-03-21 DIAGNOSIS — D509 Iron deficiency anemia, unspecified: Secondary | ICD-10-CM | POA: Diagnosis not present

## 2020-03-21 DIAGNOSIS — Z993 Dependence on wheelchair: Secondary | ICD-10-CM | POA: Diagnosis not present

## 2020-03-21 DIAGNOSIS — I10 Essential (primary) hypertension: Secondary | ICD-10-CM | POA: Diagnosis not present

## 2020-03-21 DIAGNOSIS — N390 Urinary tract infection, site not specified: Secondary | ICD-10-CM | POA: Diagnosis not present

## 2020-03-21 DIAGNOSIS — J449 Chronic obstructive pulmonary disease, unspecified: Secondary | ICD-10-CM | POA: Diagnosis not present

## 2020-03-21 DIAGNOSIS — Z87891 Personal history of nicotine dependence: Secondary | ICD-10-CM | POA: Diagnosis not present

## 2020-03-21 DIAGNOSIS — E872 Acidosis: Secondary | ICD-10-CM | POA: Diagnosis not present

## 2020-03-21 DIAGNOSIS — Z66 Do not resuscitate: Secondary | ICD-10-CM | POA: Diagnosis not present

## 2020-03-21 DIAGNOSIS — M4804 Spinal stenosis, thoracic region: Secondary | ICD-10-CM | POA: Diagnosis not present

## 2020-03-21 DIAGNOSIS — M47812 Spondylosis without myelopathy or radiculopathy, cervical region: Secondary | ICD-10-CM | POA: Diagnosis not present

## 2020-03-21 DIAGNOSIS — C189 Malignant neoplasm of colon, unspecified: Secondary | ICD-10-CM | POA: Diagnosis not present

## 2020-03-21 DIAGNOSIS — K5730 Diverticulosis of large intestine without perforation or abscess without bleeding: Secondary | ICD-10-CM | POA: Diagnosis not present

## 2020-03-21 DIAGNOSIS — R531 Weakness: Secondary | ICD-10-CM | POA: Diagnosis not present

## 2020-03-21 DIAGNOSIS — R262 Difficulty in walking, not elsewhere classified: Secondary | ICD-10-CM | POA: Diagnosis not present

## 2020-03-21 DIAGNOSIS — D649 Anemia, unspecified: Secondary | ICD-10-CM | POA: Diagnosis not present

## 2020-03-21 DIAGNOSIS — Z85828 Personal history of other malignant neoplasm of skin: Secondary | ICD-10-CM | POA: Diagnosis not present

## 2020-03-21 DIAGNOSIS — E222 Syndrome of inappropriate secretion of antidiuretic hormone: Secondary | ICD-10-CM | POA: Diagnosis not present

## 2020-03-21 DIAGNOSIS — Z9981 Dependence on supplemental oxygen: Secondary | ICD-10-CM | POA: Diagnosis not present

## 2020-03-21 DIAGNOSIS — D6481 Anemia due to antineoplastic chemotherapy: Secondary | ICD-10-CM | POA: Diagnosis not present

## 2020-03-21 DIAGNOSIS — Z85038 Personal history of other malignant neoplasm of large intestine: Secondary | ICD-10-CM | POA: Diagnosis not present

## 2020-03-21 DIAGNOSIS — R42 Dizziness and giddiness: Secondary | ICD-10-CM | POA: Diagnosis not present

## 2020-03-21 DIAGNOSIS — T451X5A Adverse effect of antineoplastic and immunosuppressive drugs, initial encounter: Secondary | ICD-10-CM | POA: Diagnosis not present

## 2020-03-21 DIAGNOSIS — D6959 Other secondary thrombocytopenia: Secondary | ICD-10-CM | POA: Diagnosis not present

## 2020-03-25 ENCOUNTER — Other Ambulatory Visit: Payer: Self-pay | Admitting: Oncology

## 2020-03-25 DIAGNOSIS — C3432 Malignant neoplasm of lower lobe, left bronchus or lung: Secondary | ICD-10-CM

## 2020-03-27 ENCOUNTER — Encounter: Payer: Self-pay | Admitting: Hematology and Oncology

## 2020-03-27 ENCOUNTER — Telehealth: Payer: Self-pay

## 2020-03-27 NOTE — Telephone Encounter (Signed)
-----   Message from Derwood Kaplan, MD sent at 03/25/2020 10:01 AM EST ----- Regarding: CT scan Pls see if we have the disk of her prior CT from [facility], around Oct 2021.  If not, need to request

## 2020-03-27 NOTE — Telephone Encounter (Signed)
Patient will need to sign a release form for [facility] to send a disk to [place]. Patient is scheduled to see [place] 03/30/20 for follow up. Do you need the disk before this appointment? If so I need to call her and get her in here to sign a medical records release form.

## 2020-03-28 ENCOUNTER — Other Ambulatory Visit: Payer: Self-pay | Admitting: Hematology and Oncology

## 2020-03-28 ENCOUNTER — Telehealth: Payer: Self-pay | Admitting: Oncology

## 2020-03-28 MED ORDER — APIXABAN 5 MG PO TABS: 5.0000 mg | ORAL_TABLET | Freq: Two times a day (BID) | ORAL | 1 refills | Status: DC

## 2020-03-28 NOTE — Telephone Encounter (Signed)
03/28/20 lft msgs cancelled ct scans(done in the ER)reminded patient about dv .

## 2020-03-28 NOTE — Telephone Encounter (Signed)
pts dtr notified eliquis script has been sent in.

## 2020-03-29 ENCOUNTER — Telehealth: Payer: Self-pay

## 2020-03-29 DIAGNOSIS — E876 Hypokalemia: Secondary | ICD-10-CM | POA: Diagnosis not present

## 2020-03-29 DIAGNOSIS — I2694 Multiple subsegmental pulmonary emboli without acute cor pulmonale: Secondary | ICD-10-CM | POA: Diagnosis not present

## 2020-03-29 DIAGNOSIS — Z85038 Personal history of other malignant neoplasm of large intestine: Secondary | ICD-10-CM | POA: Diagnosis not present

## 2020-03-29 DIAGNOSIS — E871 Hypo-osmolality and hyponatremia: Secondary | ICD-10-CM | POA: Diagnosis not present

## 2020-03-29 DIAGNOSIS — E119 Type 2 diabetes mellitus without complications: Secondary | ICD-10-CM | POA: Diagnosis not present

## 2020-03-29 DIAGNOSIS — C3490 Malignant neoplasm of unspecified part of unspecified bronchus or lung: Secondary | ICD-10-CM | POA: Diagnosis not present

## 2020-03-29 DIAGNOSIS — J449 Chronic obstructive pulmonary disease, unspecified: Secondary | ICD-10-CM | POA: Diagnosis not present

## 2020-03-29 DIAGNOSIS — H538 Other visual disturbances: Secondary | ICD-10-CM | POA: Diagnosis not present

## 2020-03-29 DIAGNOSIS — J9611 Chronic respiratory failure with hypoxia: Secondary | ICD-10-CM | POA: Diagnosis not present

## 2020-03-29 DIAGNOSIS — I110 Hypertensive heart disease with heart failure: Secondary | ICD-10-CM | POA: Diagnosis not present

## 2020-03-29 DIAGNOSIS — Z7951 Long term (current) use of inhaled steroids: Secondary | ICD-10-CM | POA: Diagnosis not present

## 2020-03-29 DIAGNOSIS — M1990 Unspecified osteoarthritis, unspecified site: Secondary | ICD-10-CM | POA: Diagnosis not present

## 2020-03-29 DIAGNOSIS — N390 Urinary tract infection, site not specified: Secondary | ICD-10-CM | POA: Diagnosis not present

## 2020-03-29 DIAGNOSIS — Z87891 Personal history of nicotine dependence: Secondary | ICD-10-CM | POA: Diagnosis not present

## 2020-03-29 DIAGNOSIS — E039 Hypothyroidism, unspecified: Secondary | ICD-10-CM | POA: Diagnosis not present

## 2020-03-29 DIAGNOSIS — Z9181 History of falling: Secondary | ICD-10-CM | POA: Diagnosis not present

## 2020-03-29 DIAGNOSIS — I2510 Atherosclerotic heart disease of native coronary artery without angina pectoris: Secondary | ICD-10-CM | POA: Diagnosis not present

## 2020-03-29 DIAGNOSIS — I509 Heart failure, unspecified: Secondary | ICD-10-CM | POA: Diagnosis not present

## 2020-03-29 DIAGNOSIS — K219 Gastro-esophageal reflux disease without esophagitis: Secondary | ICD-10-CM | POA: Diagnosis not present

## 2020-03-29 DIAGNOSIS — D61810 Antineoplastic chemotherapy induced pancytopenia: Secondary | ICD-10-CM | POA: Diagnosis not present

## 2020-03-29 NOTE — Progress Notes (Signed)
[facility]  [address] [place],  [place]  [address] [phone]  Clinic Day:  03/30/2020  Referring physician: Jeanie Sewer, NP    CHIEF COMPLAINT:  CC:  Small cell carcinoma of the left hilar area   Current Treatment:  Carboplatin/etoposide   HISTORY OF PRESENT ILLNESS:  Gabrielle Hudson is a 80 y.o. female with stage IIB non-small-cell lung cancer of the right upper lobe diagnosed in November 2010.  She had a large endobronchial component causing obstruction, so underwent laser therapy at [facility] with a good response.  Due to severe emphysema and the location of the tumor being within 2 cm of the carina, she was not a surgical candidate.  She was treated with concurrent radiation and chemotherapy with carboplatin and paclitaxel, followed by an additional 2 months of carboplatin and paclitaxel with a good response.  She had bilateral pneumonia in December 2012.  CT scan at that time revealed some abnormality concerning for recurrence, so a PET scan was obtained in January 2013.  There was mild hypermetabolic activity in the right hilum, but the patient opted for observation.  Repeat CT scans had been stable, with scarring of the posterior right upper lobe.  She also has a history of a stage I adenocarcinoma of the colon, arising from a tubulovillous adenoma.  Her colonoscopy in August 2012 had removal of multiple benign polyps.  Her last mammogram was in December 2017.  She had her Port-A-Cath removed in May 2018. She quit smoking about 11 years ago.  CT chest in December 2018 revealed progression of amorphous soft tissue attenuation of the right hilum.  There was no definite lymphadenopathy.  There were stable scattered tiny pulmonary nodules all less than 5 mm.  There is evidence of emphysema.  There is a stable right liver lesion measuring 2.5 cm which was unchanged for over 5 years.  CT chest in March of 2019 and September  of 2019 were stable, with soft tissue attenuation is in the area of previous radiation.  CT chest in October of 2020 revealed all previously noted pulmonary nodules measuring  4 mm or less in size are stable compared to the prior examination, and are considered definitively benign. She also underwent bilateral diagnostic mammogram and right breast ultrasound which revealed the likely benign mass in the right breast at 2:30 is stable. CEA had decreased from 4.6 to 4.4.    She was referred back from [facility] in October 2021 after diagnosis of a new small cell lung cancer.  She presented with hemoptysis and pain of the left lower thorax.  CT revealed tumor encasing and narrowing the left lower lobe pulmonary artery and the left lower lobe bronchus.  There is a mass of the left hilum measuring 5.2 cm with left lower lobe atelectasis.  She also has moderate emphysema and mild cardiomegaly, with evidence of coronary artery disease.  Biopsy on October 19th revealed small cell carcinoma with crush artifact.  She had associated hyponatremia as low as 125, and required demeclocycline.  She was also placed on prednisone, 40 mg daily for 5 days, then 20 mg daily.  She complains of occasional cough productive of creamy colored sputum and occasional wheezing but no further hemoptysis.  She is using a lidoderm patch for her left sided chest pain with partial relief.  She had been hospitalized several times in August of this year for hyponatremia before her new lung cancer was diagnosed.  In retrospect, the left hilar cancer was not obvious on chest x-ray.  She was found to be iron deficient and her potassium was down to 3.0 at he last visit.  Baseline CEA was 14.1.  She received 3 cycles of carbo/etoposide and has problems with hypomagnesemia.    INTERVAL HISTORY:  She is here for follow up after 3 cycles of carboplatin/etoposide.  CT does show definite response.  However she became very weak and developed  diarrhea requiring admission.  She had severe neutropenia which has worsened to as low as 0.5 with an ANC of 80, but she did receive Neulasta the week prior.  Her white blood count finally improved to 1.5 with an ANC of 650, but her platelets steadily dropped, down to 30,000 then 17,000.  She denied bleeding but did have severe bruising.  She also had a UTI with Klebsiella Pneumoniae, which was resistant only to ampicillin and was treated with IV Rocephin.  CT  chest in the emergency department revealed extensive bilateral lower lobe segmental pulmonary emboli (almost occlusive in the left lower lobe), so she was placed on heparin.  She was discharged on January 4th on Eliquis 5 mg BID.  I went back and reviewed the previous imaging reports, and we have had good shrinkage of the tumor, however, I am concerned that she may not be able to tolerate further chemotherapy.  We would like to attempt at least a 4th cycle.  She is willing to continue with treatment.  She states that her breathing is doing good, but she still requires a wheelchair to get around.  She had a fall this morning, and is considering a short term stay at a skilled nursing facility.  I will fill out the appropriate paperwork.  Her  appetite is good, and she is eating well since her last visit.  She denies fever, chills or other signs of infection.  She denies nausea, vomiting, bowel issues, or abdominal pain.  She denies sore throat, cough, dyspnea, or chest pain.  REVIEW OF SYSTEMS:  Review of Systems  Constitutional: Positive for fatigue.  HENT:  Negative.   Eyes: Negative.   Respiratory: Positive for shortness of breath (on 4 L of oxygen per nasal cannula).   Cardiovascular: Negative.   Gastrointestinal: Negative.   Endocrine: Negative.   Genitourinary: Negative.    Skin: Negative.   Neurological: Positive for extremity weakness (she had a fall this morning).  Hematological: Negative.   Psychiatric/Behavioral: Negative.       VITALS:  Blood pressure (!) 148/71, pulse (!) 102, temperature 98.1 F (36.7 C), temperature source Oral, resp. rate 18, height 5\' 3"  (1.6 m), weight 216 lb 3.2 oz (98.1 kg), SpO2 99 %.  Wt Readings from Last 3 Encounters:  03/30/20 216 lb 3.2 oz (98.1 kg)  03/16/20 224 lb (101.6 kg)  03/15/20 224 lb (101.6 kg)    Body mass index is 38.3 kg/m.  Performance status (ECOG): 2 - Symptomatic, <50% confined to bed  PHYSICAL EXAM:  Physical Exam Constitutional:      General: She is not in acute distress.    Appearance: Normal appearance. She is normal weight.  HENT:     Head: Normocephalic and atraumatic.  Eyes:     General: No scleral icterus.    Extraocular Movements: Extraocular movements intact.     Conjunctiva/sclera: Conjunctivae normal.     Pupils: Pupils are equal, round, and reactive to light.  Cardiovascular:     Rate and Rhythm: Regular rhythm.  Tachycardia present.     Pulses: Normal pulses.     Heart sounds: Normal heart sounds. No murmur heard. No friction rub. No gallop.   Pulmonary:     Effort: Pulmonary effort is normal. No respiratory distress.     Breath sounds: Normal breath sounds.  Abdominal:     General: Bowel sounds are normal. There is no distension.     Palpations: Abdomen is soft. There is no mass.     Tenderness: There is no abdominal tenderness.  Musculoskeletal:        General: Normal range of motion.     Cervical back: Normal range of motion and neck supple.     Right lower leg: No edema.     Left lower leg: No edema.  Lymphadenopathy:     Cervical: No cervical adenopathy.  Skin:    General: Skin is warm and dry.  Neurological:     General: No focal deficit present.     Mental Status: She is alert and oriented to person, place, and time. Mental status is at baseline.  Psychiatric:        Mood and Affect: Mood normal.        Behavior: Behavior normal.        Thought Content: Thought content normal.        Judgment: Judgment normal.       LABS:   CBC Latest Ref Rng & Units 03/09/2020 03/03/2020 02/16/2020  WBC - 32.7 2.6 10.7  Hemoglobin 12.0 - 16.0 8.8(A) 8.3(A) 10.0(A)  Hematocrit 36 - 46 28(A) 25(A) 31(A)  Platelets 150 - 399 198 67(A) 266   CMP Latest Ref Rng & Units 03/09/2020 03/03/2020 02/16/2020  Glucose 70 - 99 mg/dL - - -  BUN 4 - 21 13 10 15   Creatinine 0.5 - 1.1 1.0 0.9 0.8  Sodium 137 - 147 138 136(A) 130(A)  Potassium 3.4 - 5.3 4.0 3.4 3.2(A)  Chloride 99 - 108 98(A) 95(A) 88(A)  CO2 13 - 22 28(A) 31(A) 30(A)  Calcium 8.7 - 10.7 8.1(A) 8.7 9.0  Total Protein 6.5 - 8.1 g/dL - - -  Total Bilirubin 0.3 - 1.2 mg/dL - - -  Alkaline Phos 25 - 125 189(A) 139(A) 159(A)  AST 13 - 35 39(A) 19 36(A)  ALT 7 - 35 35 23 60(A)     No results found for: CEA1 / No results found for: CEA1   Lab Results  Component Value Date   TOTALPROTELP 4.7 (L) 03/26/2011   ALBUMINELP 52.5 (L) 03/26/2011   A1GS 8.7 (H) 03/26/2011   A2GS 18.8 (H) 03/26/2011   BETS 4.5 (L) 03/26/2011   BETA2SER 4.7 03/26/2011   GAMS 10.8 (L) 03/26/2011   MSPIKE NOT DETECTED 03/26/2011   SPEI (NOTE) 03/26/2011   Lab Results  Component Value Date   TIBC 219 (L) 03/29/2011   FERRITIN 356 (H) 03/29/2011   IRONPCTSAT 65 (H) 03/29/2011   Lab Results  Component Value Date   LDH 349 (H) 03/26/2011     STUDIES:   She underwent a CT chest, abdomen and pelvis as well as CT lumbar and thoracic spine on 03/20/2020 showing: 1. Extensive bilateral lower lobe segmental and subsegmental pulmonary embolus (almost occlusive in the left lower lobe). No central pulmonary embolus. No CT findings suggest right heart strain or pulmonary infarction. 2. Right paramediastinal radiation pulmonary fibrosis with suggestion of slight increased peribronchovascular consolidation within the right upper lobe. Worsening or recurrent malignancy cannot be fully excluded. Recommend attention  on follow-up. Consider PET-CT if clinically indicated. 3. No acute  displaced fracture or traumatic listhesis of the thoracic or lumbar spine. 4. Other imaging findings of potential clinical significance: At least moderate four-vessel coronary artery calcifications. Aortic Atherosclerosis (ICD10-I70.0) and Emphysema (ICD10-J43.9). Grossly stable right hepatic hemangioma. Degenerative uterine fibroid. Scattered colonic diverticulosis with no acute diverticulitis. Avascular necrosis bilateral femoral heads.  Allergies:  Allergies  Allergen Reactions  . Nitroglycerin In D5w Other (See Comments)    "flatlines"  . Nitroglycerin Other (See Comments)    Drops her BP 'flatlines'     Current Medications: Current Outpatient Medications  Medication Sig Dispense Refill  . albuterol (PROVENTIL) (5 MG/ML) 0.5% nebulizer solution Take 2.5 mg by nebulization daily.    Marland Kitchen apixaban (ELIQUIS) 5 MG TABS tablet Take 1 tablet (5 mg total) by mouth 2 (two) times daily. 60 tablet 1  . budesonide-formoterol (SYMBICORT) 160-4.5 MCG/ACT inhaler Inhale 2 puffs into the lungs 2 (two) times daily.      . Calcium Carbonate-Vitamin D (CALCIUM + D PO) Take 1 tablet by mouth daily.      . calcium citrate-vitamin D (CITRACAL+D) 315-200 MG-UNIT tablet Take 1 tablet by mouth 2 (two) times daily.    . Cholecalciferol (VITAMIN D3) 1.25 MG (50000 UT) CAPS     . Cholecalciferol 25 MCG (1000 UT) tablet Take by mouth.    . cyclobenzaprine (FLEXERIL) 10 MG tablet Take by mouth.    . diltiazem (CARDIZEM CD) 120 MG 24 hr capsule Take 120 mg by mouth daily.    . famotidine (PEPCID) 20 MG tablet Take 20 mg by mouth 2 (two) times daily.    . furosemide (LASIX) 20 MG tablet Take 20 mg by mouth as needed. ONLY TAKES IT HAVING SWELLING    . gabapentin (NEURONTIN) 300 MG capsule Take 300 mg by mouth as needed.    Marland Kitchen guaiFENesin (MUCINEX) 600 MG 12 hr tablet Take 1 tablet by mouth every 12 (twelve) hours.    Marland Kitchen HYDROcodone-acetaminophen (NORCO) 7.5-325 MG tablet Take 1 tablet by mouth every 4 (four) hours as  needed for moderate pain. 120 tablet 0  . Levothyroxine Sodium 88 MCG CAPS Take 88 mcg by mouth daily.    . magnesium oxide (MAG-OX) 400 (241.3 Mg) MG tablet Take 1 tablet (400 mg total) by mouth daily. 30 tablet 5  . ondansetron (ZOFRAN) 4 MG tablet Take 1 tablet (4 mg total) by mouth every 4 (four) hours as needed for nausea. 90 tablet 3  . OXYGEN Inhale 4 L into the lungs continuous.    . pantoprazole (PROTONIX) 20 MG tablet Take by mouth.    . potassium chloride SA (KLOR-CON) 20 MEQ tablet Take 20 mEq by mouth 2 (two) times daily.    . prochlorperazine (COMPAZINE) 10 MG tablet Take 1 tablet (10 mg total) by mouth every 6 (six) hours as needed for nausea or vomiting. 90 tablet 3  . sodium chloride 1 g tablet Take by mouth.    . Vitamin D, Ergocalciferol, (DRISDOL) 1.25 MG (50000 UNIT) CAPS capsule Take 50,000 Units by mouth once a week.     No current facility-administered medications for this visit.     ASSESSMENT & PLAN:   Assessment:   1. History of stage IIB non-small cell lung cancer in November 2010.  She remains without evidence of recurrent or metastatic disease.  2. History of stage I colon cancer.  She has not been compliant with follow-up colonoscopy.   3. Benign mass of  the right breast at 2:30 which we will continue to monitor.  4. Severe oxygen dependant COPD.  She continues 4 L of oxygen per nasal cannula.  5. 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  MRI of the brain was negative.  I am concerned that her CEA is rising and she does not seem clinically improved.  However, I have compared the imaging results, and she has had good shrinkage in her tumor.  6. Hyponatremia secondary to SIADH, improved.  7. Mild anemia, normochromic, normocytic.  Iron studies were consistent with deficiency.  This has worsened, but most likely secondary to chemotherapy.  She will probably require IV iron since we do not know  what her compliance is with oral supplement.  This is much worse, but largely from chemotherapy since all of her blood counts are lower.  8. Hypokalemia. I recommend that she resume KCL 20 meq twice daily.  9. Bilateral pulmonary emboli, diagnosed in December 2021.  She has been placed on Eliquis 5 mg BID.    10.  Hypomagnesemia.  We will continue to monitor.  Plan: She was scheduled for a 4th cycle of carboplatin/etoposide on January 10th, but we will hold this for a few weeks with her current performance status to give her time to recover.   I have reviewed the previous imaging results and we have had good shrinkage in her tumor.  However, I am concerned that she will bot be able to tolerate a 4th cycle, and we would need to reduce her dose considerably.  She is willing to continue with treatment, so we will try to at least complete 4 cycles of therapy.  She is considering a short term stay with assisted living or rehab with her current performance status, and so I will fill out the appropriate paperwork today.  I will call her with the results of her lab work and I will add a CEA today.  We will see her back in 2 weeks with CBC, CMP and magnesium for repeat examination.  She understands and agrees with this plan of care.  I have answered her questions and she knows to call with any concerns.   I provided 30 minutes of face-to-face time during this this encounter and > 50% was spent counseling as documented under my assessment and plan.      Derwood Kaplan, MD [facility] [address] [place] [address] Dept: [phone] Dept Fax: [phone]   I, Rita Ohara, am acting as scribe for Derwood Kaplan, MD  I have reviewed this report as typed by the medical scribe, and it is complete and accurate.

## 2020-03-29 NOTE — Telephone Encounter (Signed)
Tiffany with [facility] called to ask for the following Nursing 1x a week for 6 weeks, bath aid 2x a week for 6 weeks.  Approval given Dr. Hinton Rao aware.

## 2020-03-30 ENCOUNTER — Encounter: Payer: Self-pay | Admitting: Oncology

## 2020-03-30 ENCOUNTER — Telehealth: Payer: Self-pay

## 2020-03-30 ENCOUNTER — Telehealth: Payer: Self-pay | Admitting: Oncology

## 2020-03-30 ENCOUNTER — Other Ambulatory Visit: Payer: Self-pay | Admitting: Oncology

## 2020-03-30 ENCOUNTER — Inpatient Hospital Stay: Payer: Medicare Other | Attending: Oncology | Admitting: Oncology

## 2020-03-30 ENCOUNTER — Other Ambulatory Visit: Payer: Self-pay

## 2020-03-30 DIAGNOSIS — I2699 Other pulmonary embolism without acute cor pulmonale: Secondary | ICD-10-CM | POA: Insufficient documentation

## 2020-03-30 DIAGNOSIS — D241 Benign neoplasm of right breast: Secondary | ICD-10-CM | POA: Insufficient documentation

## 2020-03-30 DIAGNOSIS — C3432 Malignant neoplasm of lower lobe, left bronchus or lung: Secondary | ICD-10-CM | POA: Diagnosis not present

## 2020-03-30 DIAGNOSIS — E876 Hypokalemia: Secondary | ICD-10-CM | POA: Diagnosis not present

## 2020-03-30 DIAGNOSIS — N390 Urinary tract infection, site not specified: Secondary | ICD-10-CM | POA: Diagnosis not present

## 2020-03-30 DIAGNOSIS — M1990 Unspecified osteoarthritis, unspecified site: Secondary | ICD-10-CM | POA: Diagnosis not present

## 2020-03-30 DIAGNOSIS — I509 Heart failure, unspecified: Secondary | ICD-10-CM | POA: Diagnosis not present

## 2020-03-30 DIAGNOSIS — C3411 Malignant neoplasm of upper lobe, right bronchus or lung: Secondary | ICD-10-CM | POA: Insufficient documentation

## 2020-03-30 DIAGNOSIS — Z7901 Long term (current) use of anticoagulants: Secondary | ICD-10-CM | POA: Insufficient documentation

## 2020-03-30 DIAGNOSIS — Z7951 Long term (current) use of inhaled steroids: Secondary | ICD-10-CM | POA: Diagnosis not present

## 2020-03-30 DIAGNOSIS — Z85038 Personal history of other malignant neoplasm of large intestine: Secondary | ICD-10-CM | POA: Diagnosis not present

## 2020-03-30 DIAGNOSIS — C3490 Malignant neoplasm of unspecified part of unspecified bronchus or lung: Secondary | ICD-10-CM | POA: Diagnosis not present

## 2020-03-30 DIAGNOSIS — I2694 Multiple subsegmental pulmonary emboli without acute cor pulmonale: Secondary | ICD-10-CM | POA: Diagnosis not present

## 2020-03-30 DIAGNOSIS — E039 Hypothyroidism, unspecified: Secondary | ICD-10-CM | POA: Diagnosis not present

## 2020-03-30 DIAGNOSIS — I110 Hypertensive heart disease with heart failure: Secondary | ICD-10-CM | POA: Diagnosis not present

## 2020-03-30 DIAGNOSIS — C3491 Malignant neoplasm of unspecified part of right bronchus or lung: Secondary | ICD-10-CM | POA: Diagnosis not present

## 2020-03-30 DIAGNOSIS — E119 Type 2 diabetes mellitus without complications: Secondary | ICD-10-CM | POA: Diagnosis not present

## 2020-03-30 DIAGNOSIS — Z9181 History of falling: Secondary | ICD-10-CM | POA: Diagnosis not present

## 2020-03-30 DIAGNOSIS — D61810 Antineoplastic chemotherapy induced pancytopenia: Secondary | ICD-10-CM | POA: Diagnosis not present

## 2020-03-30 DIAGNOSIS — Z0001 Encounter for general adult medical examination with abnormal findings: Secondary | ICD-10-CM | POA: Diagnosis not present

## 2020-03-30 DIAGNOSIS — Z9981 Dependence on supplemental oxygen: Secondary | ICD-10-CM | POA: Insufficient documentation

## 2020-03-30 DIAGNOSIS — I2510 Atherosclerotic heart disease of native coronary artery without angina pectoris: Secondary | ICD-10-CM | POA: Diagnosis not present

## 2020-03-30 DIAGNOSIS — J9611 Chronic respiratory failure with hypoxia: Secondary | ICD-10-CM | POA: Diagnosis not present

## 2020-03-30 DIAGNOSIS — Z5111 Encounter for antineoplastic chemotherapy: Secondary | ICD-10-CM | POA: Insufficient documentation

## 2020-03-30 DIAGNOSIS — Z87891 Personal history of nicotine dependence: Secondary | ICD-10-CM | POA: Diagnosis not present

## 2020-03-30 DIAGNOSIS — E871 Hypo-osmolality and hyponatremia: Secondary | ICD-10-CM | POA: Insufficient documentation

## 2020-03-30 DIAGNOSIS — K219 Gastro-esophageal reflux disease without esophagitis: Secondary | ICD-10-CM | POA: Diagnosis not present

## 2020-03-30 DIAGNOSIS — J449 Chronic obstructive pulmonary disease, unspecified: Secondary | ICD-10-CM | POA: Insufficient documentation

## 2020-03-30 DIAGNOSIS — D649 Anemia, unspecified: Secondary | ICD-10-CM | POA: Insufficient documentation

## 2020-03-30 DIAGNOSIS — H538 Other visual disturbances: Secondary | ICD-10-CM | POA: Diagnosis not present

## 2020-03-30 LAB — HEPATIC FUNCTION PANEL
ALT: 32 (ref 7–35)
AST: 42 — AB (ref 13–35)
Alkaline Phosphatase: 163 — AB (ref 25–125)
Bilirubin, Total: 0.4

## 2020-03-30 LAB — CBC: RBC: 4.12 (ref 3.87–5.11)

## 2020-03-30 LAB — COMPREHENSIVE METABOLIC PANEL
Albumin: 3.8 (ref 3.5–5.0)
Calcium: 8.4 — AB (ref 8.7–10.7)

## 2020-03-30 LAB — BASIC METABOLIC PANEL
BUN: 13 (ref 4–21)
CO2: 30 — AB (ref 13–22)
Chloride: 93 — AB (ref 99–108)
Creatinine: 0.8 (ref 0.5–1.1)
Glucose: 157
Potassium: 4.5 (ref 3.4–5.3)
Sodium: 130 — AB (ref 137–147)

## 2020-03-30 LAB — CBC AND DIFFERENTIAL
HCT: 35 — AB (ref 36–46)
Hemoglobin: 11.2 — AB (ref 12.0–16.0)
Neutrophils Absolute: 29.16
Platelets: 153 (ref 150–399)
WBC: 39.4

## 2020-03-30 NOTE — Telephone Encounter (Signed)
Per 1/6 los next appt given to patient

## 2020-03-30 NOTE — Telephone Encounter (Signed)
ERROR

## 2020-03-31 DIAGNOSIS — Z85038 Personal history of other malignant neoplasm of large intestine: Secondary | ICD-10-CM | POA: Diagnosis not present

## 2020-03-31 DIAGNOSIS — Z5111 Encounter for antineoplastic chemotherapy: Secondary | ICD-10-CM | POA: Diagnosis not present

## 2020-03-31 DIAGNOSIS — Z7901 Long term (current) use of anticoagulants: Secondary | ICD-10-CM | POA: Diagnosis not present

## 2020-03-31 DIAGNOSIS — Z9981 Dependence on supplemental oxygen: Secondary | ICD-10-CM | POA: Diagnosis not present

## 2020-03-31 DIAGNOSIS — J449 Chronic obstructive pulmonary disease, unspecified: Secondary | ICD-10-CM | POA: Diagnosis not present

## 2020-03-31 DIAGNOSIS — D649 Anemia, unspecified: Secondary | ICD-10-CM | POA: Diagnosis not present

## 2020-03-31 DIAGNOSIS — E871 Hypo-osmolality and hyponatremia: Secondary | ICD-10-CM | POA: Diagnosis not present

## 2020-03-31 DIAGNOSIS — C3411 Malignant neoplasm of upper lobe, right bronchus or lung: Secondary | ICD-10-CM | POA: Diagnosis not present

## 2020-03-31 DIAGNOSIS — I2699 Other pulmonary embolism without acute cor pulmonale: Secondary | ICD-10-CM | POA: Diagnosis not present

## 2020-03-31 DIAGNOSIS — E876 Hypokalemia: Secondary | ICD-10-CM | POA: Diagnosis not present

## 2020-03-31 DIAGNOSIS — D241 Benign neoplasm of right breast: Secondary | ICD-10-CM | POA: Diagnosis not present

## 2020-04-01 LAB — CEA: CEA: 42.5 ng/mL — ABNORMAL HIGH (ref 0.0–4.7)

## 2020-04-03 DIAGNOSIS — I2699 Other pulmonary embolism without acute cor pulmonale: Secondary | ICD-10-CM | POA: Diagnosis not present

## 2020-04-03 DIAGNOSIS — T451X5A Adverse effect of antineoplastic and immunosuppressive drugs, initial encounter: Secondary | ICD-10-CM | POA: Diagnosis not present

## 2020-04-03 DIAGNOSIS — N390 Urinary tract infection, site not specified: Secondary | ICD-10-CM | POA: Diagnosis not present

## 2020-04-03 DIAGNOSIS — C3492 Malignant neoplasm of unspecified part of left bronchus or lung: Secondary | ICD-10-CM | POA: Diagnosis not present

## 2020-04-03 DIAGNOSIS — Z09 Encounter for follow-up examination after completed treatment for conditions other than malignant neoplasm: Secondary | ICD-10-CM | POA: Diagnosis not present

## 2020-04-03 DIAGNOSIS — D61810 Antineoplastic chemotherapy induced pancytopenia: Secondary | ICD-10-CM | POA: Diagnosis not present

## 2020-04-04 ENCOUNTER — Other Ambulatory Visit: Payer: Self-pay | Admitting: Pharmacist

## 2020-04-07 ENCOUNTER — Other Ambulatory Visit: Payer: Self-pay | Admitting: Hematology and Oncology

## 2020-04-07 ENCOUNTER — Telehealth: Payer: Self-pay

## 2020-04-07 NOTE — Telephone Encounter (Signed)
Attempted to call patient and daughter at all number that are listed. No answer and no VM at this time.Will try back later.

## 2020-04-07 NOTE — Telephone Encounter (Signed)
Tiffany from home health called to report that they are unable to reach patient and daughter several times this week. They even went to the house to check on them and no one answered. I notified Dr. Hinton Rao of the above.

## 2020-04-12 ENCOUNTER — Inpatient Hospital Stay: Payer: Medicare Other | Admitting: Hematology and Oncology

## 2020-04-12 ENCOUNTER — Inpatient Hospital Stay: Payer: Medicare Other

## 2020-04-12 NOTE — Progress Notes (Deleted)
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 female with stage IIB non-small-cell lung cancer of the right upper lobe diagnosed in November 2010.  She had a large endobronchial component causing obstruction, so underwent laser therapy at [facility] with a good response.  Due to severe emphysema and the location of the tumor being within 2 cm of the carina, she was not a surgical candidate.  She was treated with concurrent radiation and chemotherapy with carboplatin and paclitaxel, followed by an additional 2 months of carboplatin and paclitaxel with a good response.  She had bilateral pneumonia in December 2012.  CT scan at that time revealed some abnormality concerning for recurrence, so a PET scan was obtained in January 2013.  There was mild hypermetabolic activity in the right hilum, but the patient opted for observation.  Repeat CT scans had been stable, with scarring of the posterior right upper lobe.  She also has a history of a stage I adenocarcinoma of the colon, arising from a tubulovillous adenoma.  Her colonoscopy in August 2012 had removal of multiple benign polyps.  Her last mammogram was in December 2017.  She had her Port-A-Cath removed in May 2018. She quit smoking about 11 years ago.  CT chest in December 2018 revealed progression of amorphous soft tissue attenuation of the right hilum.  There was no definite lymphadenopathy.  There were stable scattered tiny pulmonary nodules all less than 5 mm.  There is evidence of emphysema.  There is a stable right liver lesion measuring 2.5 cm which was unchanged for over 5 years.  CT chest in March of 2019 and  September of 2019 were stable, with soft tissue attenuation is in the area of previous radiation.  CT chest in October of 2020 revealed all previously noted pulmonary nodules measuring  4 mm or less in size are stable compared to the prior examination, and are considered definitively benign. She also underwent bilateral diagnostic mammogram and right breast ultrasound which revealed the likely benign mass in the right breast at 2:30 is stable. CEA had decreased from 4.6 to 4.4.    She was referred back from [facility] in October 2021 after diagnosis of a new small cell lung cancer.  She presented with hemoptysis and pain of the left lower thorax.  CT revealed tumor encasing and narrowing the left lower lobe pulmonary artery and the left lower lobe bronchus.  There is a mass of the left hilum measuring 5.2 cm with left lower lobe atelectasis.  She also has moderate emphysema and mild cardiomegaly, with evidence of coronary artery disease.  Biopsy on October 19th revealed small cell carcinoma with crush artifact.  She had associated hyponatremia as low as 125, and required demeclocycline.  She was also placed on prednisone, 40 mg daily for 5 days, then 20 mg daily.  She complains of occasional cough productive of creamy colored sputum and occasional wheezing but no further hemoptysis.  She is using a lidoderm patch for her left sided chest pain with partial relief.  She had been hospitalized several times in August of this year for hyponatremia before her new lung cancer was  diagnosed.  In retrospect, the left hilar cancer was not obvious on chest x-ray.  She was found to be iron deficient and her potassium was down to 3.0.  Baseline CEA was 14.1.  She received 3 cycles of carboplatin/etoposide and has problems with hypomagnesemia  Her CEA had increased in November to 48.3 from 14.1 in October, so we were concerned she may not be responding.  After 3 cycles of carboplatin/etoposide, she was  hospitalized with severe weakness and diarrhea.  She was found to have severe neutropenia, which worsened during hospitalization to as low as 0.5 with an ANC of 80, despite Neulasta.  Her white blood count finally improved to 1.5 with an [place] of 650.  Her hemoglobin was down to 7. Her platelets steadily decreased daily from 140,000 at admission down to 17,000.  She denied bleeding, but did have severe bruising.  She received platelets and PRBC's during hospitalization.  Her platelets were 50,000 on discharge.  She also had a UTI with Klebsiella Pneumoniae, which was resistant only to ampicillin and was treated with IV ceftrixone.  CT  chest in the emergency department revealed extensive bilateral lower lobe segmental pulmonary emboli (almost occlusive in the left lower lobe), so she was placed on heparin.  She was discharged on January 4th on apixaban 5 mg BID.  We reviewed the previous imaging reports and there was good shrinkage of the tumor.  When she was seen on January 6th, her white count was elevated, hemoglobin improved and platelets had normalized. Her CEA was fairly stable at 42.5.  We would like to attempt at least a 4th cycle with reduction in doses.  She is willing to continue with treatment.  However, we are concerned that she may not be able to tolerate further chemotherapy.   INTERVAL HISTORY:  She is here prior to potentially resuming carboplatin/etoposide.  She denies fevers or chills. She denies pain.  Her  appetite is good.or other signs of infection. Her weight {Weight change:10426}.  REVIEW OF SYSTEMS:  Review of Systems - Oncology   VITALS:  There were no vitals taken for this visit.  Wt Readings from Last 3 Encounters:  03/30/20 216 lb 3.2 oz (98.1 kg)  03/16/20 224 lb (101.6 kg)  03/15/20 224 lb (101.6 kg)    There is no height or weight on file to calculate BMI.  Performance status (ECOG): 2 - Symptomatic, <50% confined to bed  PHYSICAL EXAM:  Physical Exam   LABS:    CBC Latest Ref Rng & Units 03/09/2020 03/03/2020 02/16/2020  WBC - 32.7 2.6 10.7  Hemoglobin 12.0 - 16.0 8.8(A) 8.3(A) 10.0(A)  Hematocrit 36 - 46 28(A) 25(A) 31(A)  Platelets 150 - 399 198 67(A) 266   CMP Latest Ref Rng & Units 03/09/2020 03/03/2020 02/16/2020  Glucose 70 - 99 mg/dL - - -  BUN 4 - 21 13 10 15   Creatinine 0.5 - 1.1 1.0 0.9 0.8  Sodium 137 - 147 138 136(A) 130(A)  Potassium 3.4 - 5.3 4.0 3.4 3.2(A)  Chloride 99 - 108 98(A) 95(A) 88(A)  CO2 13 - 22 28(A) 31(A) 30(A)  Calcium 8.7 - 10.7 8.1(A) 8.7 9.0  Total Protein 6.5 - 8.1 g/dL - - -  Total Bilirubin 0.3 - 1.2 mg/dL - - -  Alkaline Phos 25 - 125 189(A) 139(A) 159(A)  AST 13 - 35 39(A) 19 36(A)  ALT 7 - 35 35 23 60(A)     Lab Results  Component Value Date   CEA1  42.5 (H) 03/31/2020   /  CEA  Date Value Ref Range Status  03/31/2020 42.5 (H) 0.0 - 4.7 ng/mL Final    Comment:    (NOTE)                             Nonsmokers          <3.9                             Smokers             <5.6 [employer] Electrochemiluminescence Immunoassay (ECLIA) Values obtained with different assay methods or kits cannot be used interchangeably.  Results cannot be interpreted as absolute evidence of the presence or absence of malignant disease. Performed At: [facility] [place], [place] [id number] Rush Farmer MD [id number]      Lab Results  Component Value Date   TOTALPROTELP 4.7 (L) 03/26/2011   ALBUMINELP 52.5 (L) 03/26/2011   A1GS 8.7 (H) 03/26/2011   A2GS 18.8 (H) 03/26/2011   BETS 4.5 (L) 03/26/2011   BETA2SER 4.7 03/26/2011   GAMS 10.8 (L) 03/26/2011   MSPIKE NOT DETECTED 03/26/2011   SPEI (NOTE) 03/26/2011   Lab Results  Component Value Date   TIBC 219 (L) 03/29/2011   FERRITIN 356 (H) 03/29/2011   IRONPCTSAT 65 (H) 03/29/2011   Lab Results  Component Value Date   LDH 349 (H) 03/26/2011     STUDIES:    Allergies:  Allergies  Allergen Reactions  .  Nitroglycerin In D5w Other (See Comments)    "flatlines"  . Nitroglycerin Other (See Comments)    Drops her BP 'flatlines'     Current Medications: Current Outpatient Medications  Medication Sig Dispense Refill  . albuterol (PROVENTIL) (5 MG/ML) 0.5% nebulizer solution Take 2.5 mg by nebulization daily.    Marland Kitchen apixaban (ELIQUIS) 5 MG TABS tablet Take 1 tablet (5 mg total) by mouth 2 (two) times daily. 60 tablet 1  . budesonide-formoterol (SYMBICORT) 160-4.5 MCG/ACT inhaler Inhale 2 puffs into the lungs 2 (two) times daily.      . Calcium Carbonate-Vitamin D (CALCIUM + D PO) Take 1 tablet by mouth daily.      . calcium citrate-vitamin D (CITRACAL+D) 315-200 MG-UNIT tablet Take 1 tablet by mouth 2 (two) times daily.    . Cholecalciferol (VITAMIN D3) 1.25 MG (50000 UT) CAPS     . Cholecalciferol 25 MCG (1000 UT) tablet Take by mouth.    . cyclobenzaprine (FLEXERIL) 10 MG tablet Take by mouth.    . diltiazem (CARDIZEM CD) 120 MG 24 hr capsule Take 120 mg by mouth daily.    . famotidine (PEPCID) 20 MG tablet Take 20 mg by mouth 2 (two) times daily.    . furosemide (LASIX) 20 MG tablet Take 20 mg by mouth as needed. ONLY TAKES IT HAVING SWELLING    . gabapentin (NEURONTIN) 300 MG capsule Take 300 mg by mouth as needed.    Marland Kitchen guaiFENesin (MUCINEX) 600 MG 12 hr tablet Take 1 tablet by mouth every 12 (twelve) hours.    Marland Kitchen HYDROcodone-acetaminophen (NORCO) 7.5-325 MG tablet Take 1 tablet by mouth every 4 (four) hours as needed for moderate pain. 120 tablet 0  . Levothyroxine Sodium 88 MCG CAPS Take 88 mcg by mouth daily.    . magnesium oxide (MAG-OX) 400 (241.3 Mg) MG tablet Take 1 tablet (  400 mg total) by mouth daily. 30 tablet 5  . ondansetron (ZOFRAN) 4 MG tablet Take 1 tablet (4 mg total) by mouth every 4 (four) hours as needed for nausea. 90 tablet 3  . OXYGEN Inhale 4 L into the lungs continuous.    . pantoprazole (PROTONIX) 20 MG tablet Take by mouth.    . potassium chloride SA (KLOR-CON) 20  MEQ tablet Take 20 mEq by mouth 2 (two) times daily.    . prochlorperazine (COMPAZINE) 10 MG tablet Take 1 tablet (10 mg total) by mouth every 6 (six) hours as needed for nausea or vomiting. 90 tablet 3  . sodium chloride 1 g tablet Take by mouth.    . Vitamin D, Ergocalciferol, (DRISDOL) 1.25 MG (50000 UNIT) CAPS capsule Take 50,000 Units by mouth once a week.     No current facility-administered medications for this visit.     ASSESSMENT & PLAN:   Assessment:   1. History of stage IIB non-small cell lung cancer in November 2010.  She remains without evidence of recurrent or metastatic disease.  2. History of stage I colon cancer.  She has not been compliant with follow-up colonoscopy.   3. Benign mass of the right breast at 2:30, which we will continue to monitor.  4. Severe oxygen dependant COPD.  She continues 4 L of oxygen per nasal cannula.  5. 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 Recent CT imaging revealed a good response, still her CEA remained elevated.   6. Hyponatremia secondary to SIADH, improved.  7. Mild anemia, normochromic, normocytic.  Iron studies were consistent with deficiency.  This has worsened, but most likely secondary to chemotherapy.  She will probably require IV iron since we do not know what her compliance is with oral supplement.  This is much worse, but largely from chemotherapy since all of her blood counts are lower.  8. Hypokalemia. I recommend that she resume KCL 20 meq twice daily.  9. Bilateral pulmonary emboli, diagnosed in December 2021.  She  Continues apixaban 5 mg BID.    10.  Hypomagnesemia.  We will continue to monitor.  Plan:  The patient understands the plans discussed today and is in agreement with them.  She knows to contact our office if she develops concerns prior to her next appointment.  I provided 30 minutes of face-to-face time during this this encounter and  > 50% was spent counseling as documented under my assessment and plan.      [name], PA-C [facility] [address] [place] [address] Dept: [phone] Dept Fax: [phone]

## 2020-04-13 ENCOUNTER — Telehealth: Payer: Self-pay | Admitting: Hematology and Oncology

## 2020-04-13 DIAGNOSIS — I2694 Multiple subsegmental pulmonary emboli without acute cor pulmonale: Secondary | ICD-10-CM | POA: Diagnosis not present

## 2020-04-13 DIAGNOSIS — Z85038 Personal history of other malignant neoplasm of large intestine: Secondary | ICD-10-CM | POA: Diagnosis not present

## 2020-04-13 DIAGNOSIS — N390 Urinary tract infection, site not specified: Secondary | ICD-10-CM | POA: Diagnosis not present

## 2020-04-13 DIAGNOSIS — Z7951 Long term (current) use of inhaled steroids: Secondary | ICD-10-CM | POA: Diagnosis not present

## 2020-04-13 DIAGNOSIS — D61810 Antineoplastic chemotherapy induced pancytopenia: Secondary | ICD-10-CM | POA: Diagnosis not present

## 2020-04-13 DIAGNOSIS — Z9181 History of falling: Secondary | ICD-10-CM | POA: Diagnosis not present

## 2020-04-13 DIAGNOSIS — J449 Chronic obstructive pulmonary disease, unspecified: Secondary | ICD-10-CM | POA: Diagnosis not present

## 2020-04-13 DIAGNOSIS — I110 Hypertensive heart disease with heart failure: Secondary | ICD-10-CM | POA: Diagnosis not present

## 2020-04-13 DIAGNOSIS — K219 Gastro-esophageal reflux disease without esophagitis: Secondary | ICD-10-CM | POA: Diagnosis not present

## 2020-04-13 DIAGNOSIS — E871 Hypo-osmolality and hyponatremia: Secondary | ICD-10-CM | POA: Diagnosis not present

## 2020-04-13 DIAGNOSIS — E876 Hypokalemia: Secondary | ICD-10-CM | POA: Diagnosis not present

## 2020-04-13 DIAGNOSIS — M1990 Unspecified osteoarthritis, unspecified site: Secondary | ICD-10-CM | POA: Diagnosis not present

## 2020-04-13 DIAGNOSIS — C3490 Malignant neoplasm of unspecified part of unspecified bronchus or lung: Secondary | ICD-10-CM | POA: Diagnosis not present

## 2020-04-13 DIAGNOSIS — E039 Hypothyroidism, unspecified: Secondary | ICD-10-CM | POA: Diagnosis not present

## 2020-04-13 DIAGNOSIS — E119 Type 2 diabetes mellitus without complications: Secondary | ICD-10-CM | POA: Diagnosis not present

## 2020-04-13 DIAGNOSIS — J9611 Chronic respiratory failure with hypoxia: Secondary | ICD-10-CM | POA: Diagnosis not present

## 2020-04-13 DIAGNOSIS — H538 Other visual disturbances: Secondary | ICD-10-CM | POA: Diagnosis not present

## 2020-04-13 DIAGNOSIS — I2510 Atherosclerotic heart disease of native coronary artery without angina pectoris: Secondary | ICD-10-CM | POA: Diagnosis not present

## 2020-04-13 DIAGNOSIS — Z87891 Personal history of nicotine dependence: Secondary | ICD-10-CM | POA: Diagnosis not present

## 2020-04-13 DIAGNOSIS — I509 Heart failure, unspecified: Secondary | ICD-10-CM | POA: Diagnosis not present

## 2020-04-13 NOTE — Telephone Encounter (Signed)
Patient rescheduled from 1/19 to 1/26 Labs 2:15 pm - Follow Up w/Kelli 3:00 pm

## 2020-04-14 DIAGNOSIS — H538 Other visual disturbances: Secondary | ICD-10-CM | POA: Diagnosis not present

## 2020-04-14 DIAGNOSIS — J9611 Chronic respiratory failure with hypoxia: Secondary | ICD-10-CM | POA: Diagnosis not present

## 2020-04-14 DIAGNOSIS — Z9181 History of falling: Secondary | ICD-10-CM | POA: Diagnosis not present

## 2020-04-14 DIAGNOSIS — Z87891 Personal history of nicotine dependence: Secondary | ICD-10-CM | POA: Diagnosis not present

## 2020-04-14 DIAGNOSIS — K219 Gastro-esophageal reflux disease without esophagitis: Secondary | ICD-10-CM | POA: Diagnosis not present

## 2020-04-14 DIAGNOSIS — I2510 Atherosclerotic heart disease of native coronary artery without angina pectoris: Secondary | ICD-10-CM | POA: Diagnosis not present

## 2020-04-14 DIAGNOSIS — Z85038 Personal history of other malignant neoplasm of large intestine: Secondary | ICD-10-CM | POA: Diagnosis not present

## 2020-04-14 DIAGNOSIS — N390 Urinary tract infection, site not specified: Secondary | ICD-10-CM | POA: Diagnosis not present

## 2020-04-14 DIAGNOSIS — I509 Heart failure, unspecified: Secondary | ICD-10-CM | POA: Diagnosis not present

## 2020-04-14 DIAGNOSIS — I2694 Multiple subsegmental pulmonary emboli without acute cor pulmonale: Secondary | ICD-10-CM | POA: Diagnosis not present

## 2020-04-14 DIAGNOSIS — C3490 Malignant neoplasm of unspecified part of unspecified bronchus or lung: Secondary | ICD-10-CM | POA: Diagnosis not present

## 2020-04-14 DIAGNOSIS — E039 Hypothyroidism, unspecified: Secondary | ICD-10-CM | POA: Diagnosis not present

## 2020-04-14 DIAGNOSIS — M1990 Unspecified osteoarthritis, unspecified site: Secondary | ICD-10-CM | POA: Diagnosis not present

## 2020-04-14 DIAGNOSIS — E871 Hypo-osmolality and hyponatremia: Secondary | ICD-10-CM | POA: Diagnosis not present

## 2020-04-14 DIAGNOSIS — D61810 Antineoplastic chemotherapy induced pancytopenia: Secondary | ICD-10-CM | POA: Diagnosis not present

## 2020-04-14 DIAGNOSIS — I110 Hypertensive heart disease with heart failure: Secondary | ICD-10-CM | POA: Diagnosis not present

## 2020-04-14 DIAGNOSIS — Z7951 Long term (current) use of inhaled steroids: Secondary | ICD-10-CM | POA: Diagnosis not present

## 2020-04-14 DIAGNOSIS — E876 Hypokalemia: Secondary | ICD-10-CM | POA: Diagnosis not present

## 2020-04-14 DIAGNOSIS — E119 Type 2 diabetes mellitus without complications: Secondary | ICD-10-CM | POA: Diagnosis not present

## 2020-04-14 DIAGNOSIS — J449 Chronic obstructive pulmonary disease, unspecified: Secondary | ICD-10-CM | POA: Diagnosis not present

## 2020-04-18 ENCOUNTER — Telehealth: Payer: Self-pay | Admitting: Internal Medicine

## 2020-04-18 ENCOUNTER — Other Ambulatory Visit: Payer: Self-pay | Admitting: Hematology and Oncology

## 2020-04-18 DIAGNOSIS — E119 Type 2 diabetes mellitus without complications: Secondary | ICD-10-CM | POA: Diagnosis not present

## 2020-04-18 DIAGNOSIS — M1990 Unspecified osteoarthritis, unspecified site: Secondary | ICD-10-CM | POA: Diagnosis not present

## 2020-04-18 DIAGNOSIS — Z87891 Personal history of nicotine dependence: Secondary | ICD-10-CM | POA: Diagnosis not present

## 2020-04-18 DIAGNOSIS — N390 Urinary tract infection, site not specified: Secondary | ICD-10-CM | POA: Diagnosis not present

## 2020-04-18 DIAGNOSIS — E876 Hypokalemia: Secondary | ICD-10-CM | POA: Diagnosis not present

## 2020-04-18 DIAGNOSIS — I509 Heart failure, unspecified: Secondary | ICD-10-CM | POA: Diagnosis not present

## 2020-04-18 DIAGNOSIS — E039 Hypothyroidism, unspecified: Secondary | ICD-10-CM

## 2020-04-18 DIAGNOSIS — K219 Gastro-esophageal reflux disease without esophagitis: Secondary | ICD-10-CM | POA: Diagnosis not present

## 2020-04-18 DIAGNOSIS — Z85038 Personal history of other malignant neoplasm of large intestine: Secondary | ICD-10-CM | POA: Diagnosis not present

## 2020-04-18 DIAGNOSIS — I2510 Atherosclerotic heart disease of native coronary artery without angina pectoris: Secondary | ICD-10-CM | POA: Diagnosis not present

## 2020-04-18 DIAGNOSIS — Z7951 Long term (current) use of inhaled steroids: Secondary | ICD-10-CM | POA: Diagnosis not present

## 2020-04-18 DIAGNOSIS — H538 Other visual disturbances: Secondary | ICD-10-CM | POA: Diagnosis not present

## 2020-04-18 DIAGNOSIS — E871 Hypo-osmolality and hyponatremia: Secondary | ICD-10-CM | POA: Diagnosis not present

## 2020-04-18 DIAGNOSIS — D61810 Antineoplastic chemotherapy induced pancytopenia: Secondary | ICD-10-CM | POA: Diagnosis not present

## 2020-04-18 DIAGNOSIS — Z9181 History of falling: Secondary | ICD-10-CM | POA: Diagnosis not present

## 2020-04-18 DIAGNOSIS — J449 Chronic obstructive pulmonary disease, unspecified: Secondary | ICD-10-CM | POA: Diagnosis not present

## 2020-04-18 DIAGNOSIS — I110 Hypertensive heart disease with heart failure: Secondary | ICD-10-CM | POA: Diagnosis not present

## 2020-04-18 DIAGNOSIS — C3490 Malignant neoplasm of unspecified part of unspecified bronchus or lung: Secondary | ICD-10-CM | POA: Diagnosis not present

## 2020-04-18 DIAGNOSIS — I2694 Multiple subsegmental pulmonary emboli without acute cor pulmonale: Secondary | ICD-10-CM | POA: Diagnosis not present

## 2020-04-18 DIAGNOSIS — J9611 Chronic respiratory failure with hypoxia: Secondary | ICD-10-CM | POA: Diagnosis not present

## 2020-04-18 NOTE — Telephone Encounter (Signed)
   [address] Gabrielle Hudson DOB: 1940/11/23 MRN: [id number]   RIDER WAIVER AND RELEASE OF LIABILITY  For purposes of improving physical access to our facilities, [facility] is pleased to partner with third parties to provide [facility] patients or other authorized individuals the option of convenient, on-demand ground transportation services (the [place]") through use of the technology service that enables users to request on-demand ground transportation from independent third-party providers.  By opting to use and accept these [employer], I, the undersigned, hereby agree on behalf of myself, and on behalf of any minor child using the [employer] for whom I am the parent or legal guardian, as follows:  1. Government social research officer provided to me are provided by independent third-party transportation providers who are not [employer] or employees and who are unaffiliated with [employer]. 2. [facility] is neither a transportation carrier nor a common or public carrier. 3. [facility] has no control over the quality or safety of the transportation that occurs as a result of the [employer]. 4. [facility] cannot guarantee that any third-party transportation provider will complete any arranged transportation service. 5. [facility] makes no representation, warranty, or guarantee regarding the reliability, timeliness, quality, safety, suitability, or availability of any of the Transport Services or that they will be error free. 6. I fully understand that traveling by vehicle involves risks and dangers of serious bodily injury, including permanent disability, paralysis, and death. I agree, on behalf of myself and on behalf of any minor child using the Transport Services for whom I am the parent or legal guardian, that the entire risk arising out of my use of the [employer] remains solely with me, to the maximum extent permitted under applicable law. 7. The  [employer] are provided "as is" and "as available." [facility] disclaims all representations and warranties, express, implied or statutory, not expressly set out in these terms, including the implied warranties of merchantability and fitness for a particular purpose. 8. I hereby waive and release [facility], its agents, employees, officers, directors, representatives, insurers, attorneys, assigns, successors, subsidiaries, and affiliates from any and all past, present, or future claims, demands, liabilities, actions, causes of action, or suits of any kind directly or indirectly arising from acceptance and use of the [employer]. 9. I further waive and release [facility] and its affiliates from all present and future liability and responsibility for any injury or death to persons or damages to property caused by or related to the use of the [employer]. 10. I have read this Waiver and Release of Liability, and I understand the terms used in it and their legal significance. This Waiver is freely and voluntarily given with the understanding that my right (as well as the right of any minor child for whom I am the parent or legal guardian using the [employer]) to legal recourse against [facility] in connection with the [employer] is knowingly surrendered in return for use of these services.   I attest that I read the consent document to [facility], gave Gabrielle Hudson the opportunity to ask questions and answered the questions asked (if any). I affirm that Gabrielle Hudson then provided consent for she's participation in this program.     Gabrielle Hudson

## 2020-04-19 ENCOUNTER — Encounter: Payer: Self-pay | Admitting: Hematology and Oncology

## 2020-04-19 ENCOUNTER — Inpatient Hospital Stay (INDEPENDENT_AMBULATORY_CARE_PROVIDER_SITE_OTHER): Payer: Medicare Other | Admitting: Hematology and Oncology

## 2020-04-19 ENCOUNTER — Telehealth: Payer: Self-pay | Admitting: Oncology

## 2020-04-19 ENCOUNTER — Inpatient Hospital Stay: Payer: Medicare Other

## 2020-04-19 ENCOUNTER — Other Ambulatory Visit: Payer: Self-pay | Admitting: Hematology and Oncology

## 2020-04-19 VITALS — BP 149/73 | HR 100 | Temp 98.3°F | Resp 18 | Ht 63.0 in | Wt 219.7 lb

## 2020-04-19 DIAGNOSIS — I110 Hypertensive heart disease with heart failure: Secondary | ICD-10-CM | POA: Diagnosis not present

## 2020-04-19 DIAGNOSIS — Z7951 Long term (current) use of inhaled steroids: Secondary | ICD-10-CM | POA: Diagnosis not present

## 2020-04-19 DIAGNOSIS — E871 Hypo-osmolality and hyponatremia: Secondary | ICD-10-CM | POA: Diagnosis not present

## 2020-04-19 DIAGNOSIS — K219 Gastro-esophageal reflux disease without esophagitis: Secondary | ICD-10-CM | POA: Diagnosis not present

## 2020-04-19 DIAGNOSIS — J449 Chronic obstructive pulmonary disease, unspecified: Secondary | ICD-10-CM | POA: Diagnosis not present

## 2020-04-19 DIAGNOSIS — Z5111 Encounter for antineoplastic chemotherapy: Secondary | ICD-10-CM | POA: Diagnosis not present

## 2020-04-19 DIAGNOSIS — C3432 Malignant neoplasm of lower lobe, left bronchus or lung: Secondary | ICD-10-CM | POA: Diagnosis not present

## 2020-04-19 DIAGNOSIS — D649 Anemia, unspecified: Secondary | ICD-10-CM | POA: Diagnosis not present

## 2020-04-19 DIAGNOSIS — Z9981 Dependence on supplemental oxygen: Secondary | ICD-10-CM | POA: Diagnosis not present

## 2020-04-19 DIAGNOSIS — D61810 Antineoplastic chemotherapy induced pancytopenia: Secondary | ICD-10-CM | POA: Diagnosis not present

## 2020-04-19 DIAGNOSIS — E039 Hypothyroidism, unspecified: Secondary | ICD-10-CM | POA: Diagnosis not present

## 2020-04-19 DIAGNOSIS — H538 Other visual disturbances: Secondary | ICD-10-CM | POA: Diagnosis not present

## 2020-04-19 DIAGNOSIS — E222 Syndrome of inappropriate secretion of antidiuretic hormone: Secondary | ICD-10-CM

## 2020-04-19 DIAGNOSIS — Z7901 Long term (current) use of anticoagulants: Secondary | ICD-10-CM | POA: Diagnosis not present

## 2020-04-19 DIAGNOSIS — I2694 Multiple subsegmental pulmonary emboli without acute cor pulmonale: Secondary | ICD-10-CM | POA: Diagnosis not present

## 2020-04-19 DIAGNOSIS — E876 Hypokalemia: Secondary | ICD-10-CM | POA: Diagnosis not present

## 2020-04-19 DIAGNOSIS — M1990 Unspecified osteoarthritis, unspecified site: Secondary | ICD-10-CM | POA: Diagnosis not present

## 2020-04-19 DIAGNOSIS — N390 Urinary tract infection, site not specified: Secondary | ICD-10-CM | POA: Diagnosis not present

## 2020-04-19 DIAGNOSIS — J9611 Chronic respiratory failure with hypoxia: Secondary | ICD-10-CM | POA: Diagnosis not present

## 2020-04-19 DIAGNOSIS — D241 Benign neoplasm of right breast: Secondary | ICD-10-CM | POA: Diagnosis not present

## 2020-04-19 DIAGNOSIS — Z9181 History of falling: Secondary | ICD-10-CM | POA: Diagnosis not present

## 2020-04-19 DIAGNOSIS — I2510 Atherosclerotic heart disease of native coronary artery without angina pectoris: Secondary | ICD-10-CM | POA: Diagnosis not present

## 2020-04-19 DIAGNOSIS — J45998 Other asthma: Secondary | ICD-10-CM | POA: Diagnosis not present

## 2020-04-19 DIAGNOSIS — C3411 Malignant neoplasm of upper lobe, right bronchus or lung: Secondary | ICD-10-CM | POA: Diagnosis not present

## 2020-04-19 DIAGNOSIS — Z87891 Personal history of nicotine dependence: Secondary | ICD-10-CM | POA: Diagnosis not present

## 2020-04-19 DIAGNOSIS — Z85038 Personal history of other malignant neoplasm of large intestine: Secondary | ICD-10-CM | POA: Diagnosis not present

## 2020-04-19 DIAGNOSIS — I509 Heart failure, unspecified: Secondary | ICD-10-CM | POA: Diagnosis not present

## 2020-04-19 DIAGNOSIS — E538 Deficiency of other specified B group vitamins: Secondary | ICD-10-CM | POA: Diagnosis not present

## 2020-04-19 DIAGNOSIS — C3490 Malignant neoplasm of unspecified part of unspecified bronchus or lung: Secondary | ICD-10-CM | POA: Diagnosis not present

## 2020-04-19 DIAGNOSIS — E119 Type 2 diabetes mellitus without complications: Secondary | ICD-10-CM | POA: Diagnosis not present

## 2020-04-19 DIAGNOSIS — I2699 Other pulmonary embolism without acute cor pulmonale: Secondary | ICD-10-CM | POA: Diagnosis not present

## 2020-04-19 DIAGNOSIS — C3430 Malignant neoplasm of lower lobe, unspecified bronchus or lung: Secondary | ICD-10-CM

## 2020-04-19 LAB — HEPATIC FUNCTION PANEL
ALT: 22 (ref 7–35)
AST: 22 (ref 13–35)
Alkaline Phosphatase: 122 (ref 25–125)
Bilirubin, Total: 0.9

## 2020-04-19 LAB — MAGNESIUM: Magnesium: 1.7

## 2020-04-19 LAB — TSH: TSH: 7.709 u[IU]/mL — ABNORMAL HIGH (ref 0.350–4.500)

## 2020-04-19 LAB — COMPREHENSIVE METABOLIC PANEL
Albumin: 4 (ref 3.5–5.0)
Calcium: 9.3 (ref 8.7–10.7)

## 2020-04-19 LAB — BASIC METABOLIC PANEL
BUN: 12 (ref 4–21)
CO2: 29 — AB (ref 13–22)
Chloride: 100 (ref 99–108)
Creatinine: 1 (ref 0.5–1.1)
Glucose: 125
Potassium: 4.1 (ref 3.4–5.3)
Sodium: 135 — AB (ref 137–147)

## 2020-04-19 LAB — CBC AND DIFFERENTIAL
HCT: 30 — AB (ref 36–46)
Hemoglobin: 9.8 — AB (ref 12.0–16.0)
Neutrophils Absolute: 3.89
Platelets: 231 (ref 150–399)
WBC: 5.9

## 2020-04-19 LAB — CBC: RBC: 3.24 — AB (ref 3.87–5.11)

## 2020-04-19 LAB — IRON,TIBC AND FERRITIN PANEL
%SAT: 44.2
Ferritin: 361
Iron: 100
TIBC: 226

## 2020-04-19 LAB — VITAMIN B12: Vitamin B-12: >1000

## 2020-04-19 NOTE — Telephone Encounter (Signed)
Per 1/26 LOS, patient scheduled for 1/31, 2/1, 2/2 Infusion plus Injection on 2/3.  LOS states Neulasta and Care Plan shows Udenyca Injection.  Are they the same.  Also, 1/26 states to schedule 3 wk Labs, Follow Up, Infusion, Injection but it does not display in the Plan of Care  Which Infusion/Injection do I schedule

## 2020-04-19 NOTE — Progress Notes (Signed)
[facility]  [address] [place],  [place]  [address] [phone]  Clinic Day:  04/21/2020  Referring physician: Helen Hashimoto., MD    CHIEF COMPLAINT:  CC:  Small cell carcinoma of the left hilar area   Current Treatment:  Carboplatin/etoposide   HISTORY OF PRESENT ILLNESS:  Gabrielle Hudson is a 80 y.o. female with stage IIB non-small-cell lung cancer of the right upper lobe diagnosed in November 2010.  She had a large endobronchial component causing obstruction, so underwent laser therapy at [facility] with a good response.  Due to severe emphysema and the location of the tumor being within 2 cm of the carina, she was not a surgical candidate.  She was treated with concurrent radiation and chemotherapy with carboplatin and paclitaxel, followed by an additional 2 months of carboplatin and paclitaxel with a good response.  She had bilateral pneumonia in December 2012.  CT scan at that time revealed some abnormality concerning for recurrence, so a PET scan was obtained in January 2013.  There was mild hypermetabolic activity in the right hilum, but the patient opted for observation.  Repeat CT scans had been stable, with scarring of the posterior right upper lobe.  She also has a history of a stage I adenocarcinoma of the colon, arising from a tubulovillous adenoma.  Her colonoscopy in August 2012 had removal of multiple benign polyps.  Her last mammogram was in December 2017.  She had her Port-A-Cath removed in May 2018. She quit smoking about 11 years ago.  CT chest in December 2018 revealed progression of amorphous soft tissue attenuation of the right hilum.  There was no definite lymphadenopathy.  There were stable scattered tiny pulmonary nodules all less than 5 mm.  There is evidence of emphysema.  There is a stable right liver lesion measuring 2.5 cm which was unchanged for over 5 years.  CT chest in March 2019 and September  2019 were stable, with soft tissue attenuation is in the area of previous radiation.  CT chest in October 2020 revealed all previously noted pulmonary nodules measuring  4 mm or less in size are stable compared to the prior examination, and are considered definitively benign. She also underwent bilateral diagnostic mammogram and right breast ultrasound which revealed the likely benign mass in the right breast at 2:30 is stable. CEA had decreased from 4.6 to 4.4.    She was referred back from [facility] in October 2021 after diagnosis of a new small cell lung cancer.  She presented with hemoptysis and pain of the left lower thorax.  CT revealed tumor encasing and narrowing the left lower lobe pulmonary artery and the left lower lobe bronchus.  There is a mass of the left hilum measuring 5.2 cm with left lower lobe atelectasis.  She also has moderate emphysema and mild cardiomegaly, with evidence of coronary artery disease.  Biopsy in October revealed small cell carcinoma with crush artifact.  She had associated hyponatremia as low as 125 and required demeclocycline. She had been hospitalized several times in August of this year for hyponatremia before her new lung cancer was diagnosed.  In retrospect, the left hilar cancer was not obvious on chest x-ray.  She was found to be iron deficient and her potassium was down to 3.0. She was placed on oral iron and potassium supplementation.  Baseline CEA was 14.1.  She received 3 cycles of carboplatin/etoposide and has problems with hypomagnesemia  Her CEA had increased in November to 48.3 from 14.1 in October, so we were concerned she may not be responding.  After 3 cycles of carboplatin/etoposide, she was hospitalized  In late December with severe weakness and diarrhea.  She was found to have severe neutropenia, which worsened during hospitalization to as low as 0.5 with an ANC of 80, despite Neulasta.  Her white blood count finally improved to 1.5 with  an Bagnell of 650.  Her hemoglobin was down to 7. Her platelets steadily decreased daily from 140,000 at admission down to 17,000.  She denied bleeding, but did have severe bruising.  She received platelets and PRBC's during hospitalization.  Her platelets were 50,000 on discharge.  She also had a UTI with Klebsiella Pneumoniae, which was resistant only to ampicillin and was treated with IV ceftrixone.  CT chest in the emergency department revealed extensive bilateral lower lobe segmental pulmonary emboli (almost occlusive in the left lower lobe), so she was placed on heparin.  She was discharged on January 4th on apixaban 5 mg BID.  We reviewed the previous imaging reports and there was good shrinkage of the tumor.    When she was seen on January 6th, her white count was elevated, hemoglobin improved and platelets had normalized. Her CEA was fairly stable at 42.5.  We would like to attempt at least a 4th cycle with reduction in doses.  She is willing to continue with treatment.  However, we are concerned that she may not be able to tolerate further chemotherapy.   INTERVAL HISTORY:  She is here prior to potentially resuming carboplatin/etoposide. She is using a motorized wheelchair now.  She has chronic dyspnea with exertion, which is stable.  She is  on oxygen 2 L per nasal cannula.  She denies cough or chest pain.  She denies progressive fatigue concerning for recurrent anemia.  She denies any overt form of bleeding. She reports constipation, but has not taken anything for this. She denies fevers or chills. She denies pain.  Her  appetite is good. Her weight has increased 3 pounds over last 2 weeks.  REVIEW OF SYSTEMS:  Review of Systems  Constitutional: Negative for appetite change, chills, fatigue, fever and unexpected weight change.  HENT:   Negative for lump/mass, mouth sores and sore throat.   Respiratory: Positive for shortness of breath. Negative for cough.   Cardiovascular: Negative for chest pain  and leg swelling.  Gastrointestinal: Positive for constipation. Negative for abdominal pain, diarrhea, nausea and vomiting.  Genitourinary: Negative for difficulty urinating, dysuria, frequency and hematuria.   Musculoskeletal: Negative for arthralgias, back pain and myalgias.  Skin: Negative for rash.  Neurological: Negative for dizziness and headaches.  Psychiatric/Behavioral: Negative for depression and sleep disturbance. The patient is not nervous/anxious.      VITALS:  Blood pressure (!) 149/73, pulse 100, temperature 98.3 F (36.8 C), temperature source Oral, resp. rate 18, height 5\' 3"  (1.6 m), weight 219 lb 11.2 oz (99.7 kg), SpO2 96 %.  Wt Readings from Last 3 Encounters:  04/19/20 219 lb 11.2 oz (99.7 kg)  03/30/20 216 lb 3.2 oz (98.1 kg)  03/16/20 224 lb (101.6 kg)    Body mass index is 38.92 kg/m.  Performance status (ECOG): 2 - Symptomatic, <50% confined to bed  PHYSICAL EXAM:  Physical Exam Vitals and nursing note reviewed.  Constitutional:      Appearance: Normal appearance.  HENT:     Mouth/Throat:     Mouth: Mucous membranes are moist.  Pharynx: Oropharynx is clear. No oropharyngeal exudate or posterior oropharyngeal erythema.  Eyes:     General: No scleral icterus.    Extraocular Movements: Extraocular movements intact.     Conjunctiva/sclera: Conjunctivae normal.     Pupils: Pupils are equal, round, and reactive to light.  Cardiovascular:     Rate and Rhythm: Normal rate and regular rhythm.     Heart sounds: Normal heart sounds. No murmur heard. No friction rub. No gallop.   Pulmonary:     Effort: Pulmonary effort is normal. No respiratory distress.     Breath sounds: Normal breath sounds. No stridor. No wheezing, rhonchi or rales.  Chest:  Breasts:     Right: No axillary adenopathy or supraclavicular adenopathy.     Left: No axillary adenopathy or supraclavicular adenopathy.    Abdominal:     General: There is no distension.     Palpations:  Abdomen is soft. There is no hepatomegaly, splenomegaly or mass.     Tenderness: There is no abdominal tenderness. There is no guarding.     Hernia: No hernia is present.  Musculoskeletal:        General: Normal range of motion.     Cervical back: Normal range of motion and neck supple. No tenderness.     Right lower leg: Edema (trace) present.     Left lower leg: Edema (trace) present.  Lymphadenopathy:     Cervical: No cervical adenopathy.     Upper Body:     Right upper body: No supraclavicular or axillary adenopathy.     Left upper body: No supraclavicular or axillary adenopathy.     Lower Body: No right inguinal adenopathy. No left inguinal adenopathy.  Skin:    General: Skin is warm and dry.     Coloration: Skin is not jaundiced.     Findings: No rash.  Neurological:     Mental Status: She is alert and oriented to person, place, and time.     Cranial Nerves: No cranial nerve deficit.  Psychiatric:        Mood and Affect: Mood normal.        Behavior: Behavior normal.        Thought Content: Thought content normal.      LABS:   CBC Latest Ref Rng & Units 04/19/2020 03/30/2020 03/09/2020  WBC - 5.9 39.4 32.7  Hemoglobin 12.0 - 16.0 9.8(A) 11.2(A) 8.8(A)  Hematocrit 36 - 46 30(A) 35(A) 28(A)  Platelets 150 - 399 231 153 198   CMP Latest Ref Rng & Units 04/19/2020 03/30/2020 03/09/2020  Glucose 70 - 99 mg/dL - - -  BUN 4 - 21 12 13 13   Creatinine 0.5 - 1.1 1.0 0.8 1.0  Sodium 137 - 147 135(A) 130(A) 138  Potassium 3.4 - 5.3 4.1 4.5 4.0  Chloride 99 - 108 100 93(A) 98(A)  CO2 13 - 22 29(A) 30(A) 28(A)  Calcium 8.7 - 10.7 9.3 8.4(A) 8.1(A)  Total Protein 6.5 - 8.1 g/dL - - -  Total Bilirubin 0.3 - 1.2 mg/dL - - -  Alkaline Phos 25 - 125 122 163(A) 189(A)  AST 13 - 35 22 42(A) 39(A)  ALT 7 - 35 22 32 35     Lab Results  Component Value Date   CEA1 42.5 (H) 03/31/2020   /  CEA  Date Value Ref Range Status  03/31/2020 42.5 (H) 0.0 - 4.7 ng/mL Final    Comment:     (NOTE)  Nonsmokers          <3.9                             Smokers             <5.6 [employer] Electrochemiluminescence Immunoassay (ECLIA) Values obtained with different assay methods or kits cannot be used interchangeably.  Results cannot be interpreted as absolute evidence of the presence or absence of malignant disease. Performed At: [facility] [place], [place] [id number] Rush Farmer MD IR:[id number]      Lab Results  Component Value Date   TOTALPROTELP 4.7 (L) 03/26/2011   ALBUMINELP 52.5 (L) 03/26/2011   A1GS 8.7 (H) 03/26/2011   A2GS 18.8 (H) 03/26/2011   BETS 4.5 (L) 03/26/2011   BETA2SER 4.7 03/26/2011   GAMS 10.8 (L) 03/26/2011   MSPIKE NOT DETECTED 03/26/2011   SPEI (NOTE) 03/26/2011   Lab Results  Component Value Date   TIBC 226 04/19/2020   TIBC 219 (L) 03/29/2011   FERRITIN 361 04/19/2020   FERRITIN 356 (H) 03/29/2011   IRONPCTSAT 44.2 04/19/2020   IRONPCTSAT 65 (H) 03/29/2011   Lab Results  Component Value Date   LDH 349 (H) 03/26/2011     STUDIES:    Allergies:  Allergies  Allergen Reactions  . Nitroglycerin In D5w Other (See Comments)    "flatlines"  . Nitroglycerin Other (See Comments)    Drops her BP 'flatlines'     Current Medications: Current Outpatient Medications  Medication Sig Dispense Refill  . albuterol (PROVENTIL) (5 MG/ML) 0.5% nebulizer solution Take 2.5 mg by nebulization daily.    Marland Kitchen apixaban (ELIQUIS) 5 MG TABS tablet Take 1 tablet (5 mg total) by mouth 2 (two) times daily. 60 tablet 1  . budesonide-formoterol (SYMBICORT) 160-4.5 MCG/ACT inhaler Inhale 2 puffs into the lungs 2 (two) times daily.      . Calcium Carbonate-Vitamin D (CALCIUM + D PO) Take 1 tablet by mouth daily.      . calcium citrate-vitamin D (CITRACAL+D) 315-200 MG-UNIT tablet Take 1 tablet by mouth 2 (two) times daily.    . Cholecalciferol (VITAMIN D3) 1.25 MG (50000 UT) CAPS     .  Cholecalciferol 25 MCG (1000 UT) tablet Take by mouth.    . cyclobenzaprine (FLEXERIL) 10 MG tablet Take by mouth.    . diltiazem (CARDIZEM CD) 120 MG 24 hr capsule Take 120 mg by mouth daily.    . famotidine (PEPCID) 20 MG tablet Take 20 mg by mouth 2 (two) times daily.    . furosemide (LASIX) 20 MG tablet Take 20 mg by mouth as needed. ONLY TAKES IT HAVING SWELLING    . gabapentin (NEURONTIN) 300 MG capsule Take 300 mg by mouth as needed.    Marland Kitchen guaiFENesin (MUCINEX) 600 MG 12 hr tablet Take 1 tablet by mouth every 12 (twelve) hours.    Marland Kitchen HYDROcodone-acetaminophen (NORCO) 7.5-325 MG tablet Take 1 tablet by mouth every 4 (four) hours as needed for moderate pain. 120 tablet 0  . Levothyroxine Sodium 88 MCG CAPS Take 88 mcg by mouth daily.    . magnesium oxide (MAG-OX) 400 (241.3 Mg) MG tablet Take 1 tablet (400 mg total) by mouth daily. 30 tablet 5  . ondansetron (ZOFRAN) 4 MG tablet Take 1 tablet (4 mg total) by mouth every 4 (four) hours as needed for nausea. 90 tablet 3  . OXYGEN Inhale 4 L into the lungs  continuous.    . pantoprazole (PROTONIX) 20 MG tablet Take by mouth.    . potassium chloride SA (KLOR-CON) 20 MEQ tablet Take 20 mEq by mouth 2 (two) times daily.    . prochlorperazine (COMPAZINE) 10 MG tablet Take 1 tablet (10 mg total) by mouth every 6 (six) hours as needed for nausea or vomiting. 90 tablet 3  . sodium chloride 1 g tablet Take by mouth.    . TRELEGY ELLIPTA 100-62.5-25 MCG/INH AEPB Inhale 1 puff into the lungs daily.    . Vitamin D, Ergocalciferol, (DRISDOL) 1.25 MG (50000 UNIT) CAPS capsule Take 50,000 Units by mouth once a week.     No current facility-administered medications for this visit.     ASSESSMENT & PLAN:   Assessment:   1. History of stage IIB non-small cell lung cancer in November 2010.  She remains without evidence of recurrent or metastatic disease.  2. History of stage I colon cancer.  She has not been compliant with follow-up colonoscopy.   3.  Benign mass of the right breast at 2:30, which we will continue to monitor.  4. Severe oxygen dependant COPD.  She continues 4 L of oxygen per nasal cannula.  5. 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 Recent CT imaging revealed a good response, still her CEA remained elevated.   6. Hyponatremia secondary to SIADH, improved.  7. Worsening anemia, normochromic, normocytic.  Iron studies were consistent with deficiency in October.  Repeat iron studies, B12 and folate  Did not reveal any evidence of nutritional deficiency. This is most likely anemia of chronic disease and cancer.  8. Hypokalemia, resolved with KCL 20 meq twice daily.  9. Bilateral pulmonary emboli, diagnosed in December 2021.  She continues apixaban 5 mg BID.    10.  Hypomagnesemia , resolved.  11. Hypothyroidism, she is on levothyroxine 88 mcg daily.  Her TSH is elevated, I will forward this to her primary care provider.  Plan:  As she has returned to baseline, we will plan to resume chemotherapy with carboplatin/etoposide for at least 1 more cycle, for a total of 4 cycles, but at a reduced dose of 25-30%.  She will receive Neulasta to prevent complications of prolonged neutropenia.   Will plan to see her back in 10 days with a CBC, comprehensive metabolic panel and magnesium to see how she tolerated treatment.  The patient understands the plans discussed today and is in agreement with them.  She knows to contact our office if she develops concerns prior to her next appointment.       [name], PA-C [facility] [address] [place] [address] Dept: [phone] Dept Fax: [phone]

## 2020-04-19 NOTE — Progress Notes (Signed)
Sent in request to [facility] Transportation for DOS: 01/31, 02/01, 02/02, 02/03, and 02/11.

## 2020-04-20 LAB — T4: T4, Total: 7.9 ug/dL (ref 4.5–12.0)

## 2020-04-21 ENCOUNTER — Telehealth: Payer: Self-pay

## 2020-04-21 ENCOUNTER — Other Ambulatory Visit: Payer: Self-pay | Admitting: Oncology

## 2020-04-21 LAB — FOLATE: Folate: 7.64

## 2020-04-21 NOTE — Telephone Encounter (Signed)
-----   Message from [name], PA-C sent at 04/21/2020  4:38 PM EST ----- Please fax TSH from 1/26 in Epic to Dr. Jenean Lindau. Thanks!

## 2020-04-21 NOTE — Telephone Encounter (Signed)
Faxed TSH level from 04/19/20 to [phone]

## 2020-04-24 ENCOUNTER — Inpatient Hospital Stay: Payer: Medicare Other

## 2020-04-24 ENCOUNTER — Other Ambulatory Visit: Payer: Self-pay

## 2020-04-24 VITALS — BP 151/74 | HR 88 | Temp 98.2°F | Resp 18 | Ht 63.0 in | Wt 220.0 lb

## 2020-04-24 DIAGNOSIS — K219 Gastro-esophageal reflux disease without esophagitis: Secondary | ICD-10-CM | POA: Diagnosis not present

## 2020-04-24 DIAGNOSIS — Z5111 Encounter for antineoplastic chemotherapy: Secondary | ICD-10-CM | POA: Diagnosis not present

## 2020-04-24 DIAGNOSIS — J9611 Chronic respiratory failure with hypoxia: Secondary | ICD-10-CM | POA: Diagnosis not present

## 2020-04-24 DIAGNOSIS — D61810 Antineoplastic chemotherapy induced pancytopenia: Secondary | ICD-10-CM | POA: Diagnosis not present

## 2020-04-24 DIAGNOSIS — N390 Urinary tract infection, site not specified: Secondary | ICD-10-CM | POA: Diagnosis not present

## 2020-04-24 DIAGNOSIS — Z9181 History of falling: Secondary | ICD-10-CM | POA: Diagnosis not present

## 2020-04-24 DIAGNOSIS — Z85038 Personal history of other malignant neoplasm of large intestine: Secondary | ICD-10-CM | POA: Diagnosis not present

## 2020-04-24 DIAGNOSIS — J449 Chronic obstructive pulmonary disease, unspecified: Secondary | ICD-10-CM | POA: Diagnosis not present

## 2020-04-24 DIAGNOSIS — E222 Syndrome of inappropriate secretion of antidiuretic hormone: Secondary | ICD-10-CM

## 2020-04-24 DIAGNOSIS — M1990 Unspecified osteoarthritis, unspecified site: Secondary | ICD-10-CM | POA: Diagnosis not present

## 2020-04-24 DIAGNOSIS — E119 Type 2 diabetes mellitus without complications: Secondary | ICD-10-CM | POA: Diagnosis not present

## 2020-04-24 DIAGNOSIS — C3411 Malignant neoplasm of upper lobe, right bronchus or lung: Secondary | ICD-10-CM | POA: Diagnosis not present

## 2020-04-24 DIAGNOSIS — E871 Hypo-osmolality and hyponatremia: Secondary | ICD-10-CM | POA: Diagnosis not present

## 2020-04-24 DIAGNOSIS — Z7901 Long term (current) use of anticoagulants: Secondary | ICD-10-CM | POA: Diagnosis not present

## 2020-04-24 DIAGNOSIS — Z9981 Dependence on supplemental oxygen: Secondary | ICD-10-CM | POA: Diagnosis not present

## 2020-04-24 DIAGNOSIS — C3490 Malignant neoplasm of unspecified part of unspecified bronchus or lung: Secondary | ICD-10-CM | POA: Diagnosis not present

## 2020-04-24 DIAGNOSIS — E039 Hypothyroidism, unspecified: Secondary | ICD-10-CM | POA: Diagnosis not present

## 2020-04-24 DIAGNOSIS — D649 Anemia, unspecified: Secondary | ICD-10-CM | POA: Diagnosis not present

## 2020-04-24 DIAGNOSIS — D241 Benign neoplasm of right breast: Secondary | ICD-10-CM | POA: Diagnosis not present

## 2020-04-24 DIAGNOSIS — I110 Hypertensive heart disease with heart failure: Secondary | ICD-10-CM | POA: Diagnosis not present

## 2020-04-24 DIAGNOSIS — Z7951 Long term (current) use of inhaled steroids: Secondary | ICD-10-CM | POA: Diagnosis not present

## 2020-04-24 DIAGNOSIS — C3432 Malignant neoplasm of lower lobe, left bronchus or lung: Secondary | ICD-10-CM

## 2020-04-24 DIAGNOSIS — I2699 Other pulmonary embolism without acute cor pulmonale: Secondary | ICD-10-CM | POA: Diagnosis not present

## 2020-04-24 DIAGNOSIS — H538 Other visual disturbances: Secondary | ICD-10-CM | POA: Diagnosis not present

## 2020-04-24 DIAGNOSIS — E876 Hypokalemia: Secondary | ICD-10-CM | POA: Diagnosis not present

## 2020-04-24 DIAGNOSIS — I2510 Atherosclerotic heart disease of native coronary artery without angina pectoris: Secondary | ICD-10-CM | POA: Diagnosis not present

## 2020-04-24 DIAGNOSIS — I509 Heart failure, unspecified: Secondary | ICD-10-CM | POA: Diagnosis not present

## 2020-04-24 DIAGNOSIS — Z87891 Personal history of nicotine dependence: Secondary | ICD-10-CM | POA: Diagnosis not present

## 2020-04-24 DIAGNOSIS — I2694 Multiple subsegmental pulmonary emboli without acute cor pulmonale: Secondary | ICD-10-CM | POA: Diagnosis not present

## 2020-04-24 MED ORDER — SODIUM CHLORIDE 0.9 % IV SOLN
73.0000 mg/m2 | Freq: Once | INTRAVENOUS | Status: AC
  Administered 2020-04-24: 160 mg via INTRAVENOUS
  Filled 2020-04-24: qty 8

## 2020-04-24 MED ORDER — SODIUM CHLORIDE 0.9 % IV SOLN
Freq: Once | INTRAVENOUS | Status: AC
  Filled 2020-04-24: qty 250

## 2020-04-24 MED ORDER — SODIUM CHLORIDE 0.9 % IV SOLN
10.0000 mg | Freq: Once | INTRAVENOUS | Status: AC
  Administered 2020-04-24: 10 mg via INTRAVENOUS
  Filled 2020-04-24: qty 10

## 2020-04-24 MED ORDER — PALONOSETRON HCL INJECTION 0.25 MG/5ML
0.2500 mg | Freq: Once | INTRAVENOUS | Status: AC
  Administered 2020-04-24: 0.25 mg via INTRAVENOUS

## 2020-04-24 MED ORDER — SODIUM CHLORIDE 0.9 % IV SOLN
150.0000 mg | Freq: Once | INTRAVENOUS | Status: AC
  Administered 2020-04-24: 150 mg via INTRAVENOUS
  Filled 2020-04-24: qty 150

## 2020-04-24 MED ORDER — DIPHENHYDRAMINE HCL 50 MG/ML IJ SOLN
50.0000 mg | Freq: Once | INTRAMUSCULAR | Status: AC
  Administered 2020-04-24: 50 mg via INTRAVENOUS

## 2020-04-24 MED ORDER — DIPHENHYDRAMINE HCL 50 MG/ML IJ SOLN
INTRAMUSCULAR | Status: AC
  Filled 2020-04-24: qty 1

## 2020-04-24 MED ORDER — SODIUM CHLORIDE 0.9 % IV SOLN
362.8800 mg | Freq: Once | INTRAVENOUS | Status: AC
  Administered 2020-04-24: 360 mg via INTRAVENOUS
  Filled 2020-04-24: qty 36

## 2020-04-24 MED ORDER — HEPARIN SOD (PORK) LOCK FLUSH 100 UNIT/ML IV SOLN
500.0000 [IU] | Freq: Once | INTRAVENOUS | Status: AC | PRN
  Administered 2020-04-24: 500 [IU]
  Filled 2020-04-24: qty 5

## 2020-04-24 MED ORDER — FAMOTIDINE IN NACL 20-0.9 MG/50ML-% IV SOLN
20.0000 mg | Freq: Once | INTRAVENOUS | Status: AC
  Administered 2020-04-24: 20 mg via INTRAVENOUS

## 2020-04-24 MED ORDER — FAMOTIDINE IN NACL 20-0.9 MG/50ML-% IV SOLN
INTRAVENOUS | Status: AC
  Filled 2020-04-24: qty 50

## 2020-04-24 MED ORDER — PALONOSETRON HCL INJECTION 0.25 MG/5ML
INTRAVENOUS | Status: AC
  Filled 2020-04-24: qty 5

## 2020-04-24 NOTE — Patient Instructions (Signed)
Etoposide, VP-16 capsules What is this medicine? ETOPOSIDE, VP-16 (e toe POE side) is a chemotherapy drug. It is used to treat small cell lung cancer and other cancers. This medicine may be used for other purposes; ask your health care provider or pharmacist if you have questions. COMMON BRAND NAME(S): VePesid What should I tell my health care provider before I take this medicine? They need to know if you have any of these conditions:  infection  kidney disease  liver disease  low blood counts, like low white cell, platelet, or red cell counts  an unusual or allergic reaction to etoposide, other medicines, foods, dyes, or preservatives  pregnant or trying to get pregnant  breast-feeding How should I use this medicine? Take this medicine by mouth with a glass of water. Follow the directions on the prescription label. Do not open, crush, or chew the capsules. It is advisable to wear gloves when handling this medicine. Take your medicine at regular intervals. Do not take it more often than directed. Do not stop taking except on your doctor's advice. Talk to your pediatrician regarding the use of this medicine in children. Special care may be needed. Overdosage: If you think you have taken too much of this medicine contact a poison control center or emergency room at once. NOTE: This medicine is only for you. Do not share this medicine with others. What if I miss a dose? If you miss a dose, take it as soon as you can. If it is almost time for your next dose, take only that dose. Do not take double or extra doses. What may interact with this medicine? This medicine may interact with the following medications:  cyclosporine  warfarin This list may not describe all possible interactions. Give your health care provider a list of all the medicines, herbs, non-prescription drugs, or dietary supplements you use. Also tell them if you smoke, drink alcohol, or use illegal drugs. Some items may  interact with your medicine. What should I watch for while using this medicine? Visit your doctor for checks on your progress. This drug may make you feel generally unwell. This is not uncommon, as chemotherapy can affect healthy cells as well as cancer cells. Report any side effects. Continue your course of treatment even though you feel ill unless your doctor tells you to stop. In some cases, you may be given additional medicines to help with side effects. Follow all directions for their use. Call your doctor or health care professional for advice if you get a fever, chills or sore throat, or other symptoms of a cold or flu. Do not treat yourself. This drug decreases your body's ability to fight infections. Try to avoid being around people who are sick. This medicine may increase your risk to bruise or bleed. Call your doctor or health care professional if you notice any unusual bleeding. Talk to your doctor about your risk of cancer. You may be more at risk for certain types of cancers if you take this medicine. Do not become pregnant while taking this medicine or for at least 6 months after stopping it. Women should inform their doctor if they wish to become pregnant or think they might be pregnant. Women of child-bearing potential will need to have a negative pregnancy test before starting this medicine. There is a potential for serious side effects to an unborn child. Talk to your health care professional or pharmacist for more information. Do not breast-feed an infant while taking this medicine. Men  must use a latex condom during sexual contact with a woman while taking this medicine and for at least 4 months after stopping it. A latex condom is needed even if you have had a vasectomy. Contact your doctor right away if your partner becomes pregnant. Do not donate sperm while taking this medicine and for 4 months after you stop taking this medicine. Men should inform their doctors if they wish to  father a child. This medicine may lower sperm counts. What side effects may I notice from receiving this medicine? Side effects that you should report to your doctor or health care professional as soon as possible:  allergic reactions like skin rash, itching or hives, swelling of the face, lips, or tongue  low blood counts - this medicine may decrease the number of white blood cells, red blood cells, and platelets. You may be at increased risk for infections and bleeding  nausea, vomiting  redness, blistering, peeling or loosening of the skin, including inside the mouth  signs and symptoms of infection like fever; chills; cough; sore throat; pain or trouble passing urine  signs and symptoms of low red blood cells or anemia such as unusually weak or tired; feeling faint or lightheaded; falls; breathing problems  unusual bruising or bleeding Side effects that usually do not require medical attention (report to your doctor or health care professional if they continue or are bothersome):  changes in taste  diarrhea  hair loss  loss of appetite  mouth sores This list may not describe all possible side effects. Call your doctor for medical advice about side effects. You may report side effects to FDA at 1-800-FDA-1088. Where should I keep my medicine? Keep out of the reach of children. Store in a refrigerator between 2 and 8 degrees C (36 and 46 degrees F). Do not freeze. Throw away any unused medicine after the expiration date. NOTE: This sheet is a summary. It may not cover all possible information. If you have questions about this medicine, talk to your doctor, pharmacist, or health care provider.  2021 Elsevier/Gold Standard (2018-05-06 16:44:55)

## 2020-04-25 ENCOUNTER — Inpatient Hospital Stay: Payer: Medicare Other | Attending: Oncology

## 2020-04-25 DIAGNOSIS — Z5111 Encounter for antineoplastic chemotherapy: Secondary | ICD-10-CM | POA: Diagnosis not present

## 2020-04-25 DIAGNOSIS — D241 Benign neoplasm of right breast: Secondary | ICD-10-CM | POA: Insufficient documentation

## 2020-04-25 DIAGNOSIS — J449 Chronic obstructive pulmonary disease, unspecified: Secondary | ICD-10-CM | POA: Diagnosis not present

## 2020-04-25 DIAGNOSIS — Z9981 Dependence on supplemental oxygen: Secondary | ICD-10-CM | POA: Insufficient documentation

## 2020-04-25 DIAGNOSIS — E871 Hypo-osmolality and hyponatremia: Secondary | ICD-10-CM

## 2020-04-25 DIAGNOSIS — E222 Syndrome of inappropriate secretion of antidiuretic hormone: Secondary | ICD-10-CM | POA: Diagnosis not present

## 2020-04-25 DIAGNOSIS — E039 Hypothyroidism, unspecified: Secondary | ICD-10-CM | POA: Insufficient documentation

## 2020-04-25 DIAGNOSIS — C3411 Malignant neoplasm of upper lobe, right bronchus or lung: Secondary | ICD-10-CM | POA: Diagnosis not present

## 2020-04-25 DIAGNOSIS — Z86711 Personal history of pulmonary embolism: Secondary | ICD-10-CM | POA: Diagnosis not present

## 2020-04-25 DIAGNOSIS — I2699 Other pulmonary embolism without acute cor pulmonale: Secondary | ICD-10-CM | POA: Diagnosis not present

## 2020-04-25 DIAGNOSIS — Z7901 Long term (current) use of anticoagulants: Secondary | ICD-10-CM | POA: Diagnosis not present

## 2020-04-25 DIAGNOSIS — D649 Anemia, unspecified: Secondary | ICD-10-CM | POA: Diagnosis not present

## 2020-04-25 DIAGNOSIS — Z85038 Personal history of other malignant neoplasm of large intestine: Secondary | ICD-10-CM | POA: Insufficient documentation

## 2020-04-25 DIAGNOSIS — C3432 Malignant neoplasm of lower lobe, left bronchus or lung: Secondary | ICD-10-CM

## 2020-04-25 MED ORDER — SODIUM CHLORIDE 0.9 % IV SOLN
73.0000 mg/m2 | Freq: Once | INTRAVENOUS | Status: AC
  Administered 2020-04-25: 160 mg via INTRAVENOUS
  Filled 2020-04-25: qty 8

## 2020-04-25 MED ORDER — HEPARIN SOD (PORK) LOCK FLUSH 100 UNIT/ML IV SOLN
500.0000 [IU] | Freq: Once | INTRAVENOUS | Status: AC | PRN
  Administered 2020-04-25: 500 [IU]
  Filled 2020-04-25: qty 5

## 2020-04-25 MED ORDER — SODIUM CHLORIDE 0.9 % IV SOLN
Freq: Once | INTRAVENOUS | Status: AC
  Filled 2020-04-25: qty 250

## 2020-04-25 MED ORDER — SODIUM CHLORIDE 0.9 % IV SOLN
10.0000 mg | Freq: Once | INTRAVENOUS | Status: AC
  Administered 2020-04-25: 10 mg via INTRAVENOUS
  Filled 2020-04-25: qty 10

## 2020-04-26 ENCOUNTER — Inpatient Hospital Stay: Payer: Medicare Other

## 2020-04-26 ENCOUNTER — Other Ambulatory Visit: Payer: Self-pay

## 2020-04-26 VITALS — BP 148/74 | HR 83 | Temp 98.2°F | Resp 18

## 2020-04-26 DIAGNOSIS — E039 Hypothyroidism, unspecified: Secondary | ICD-10-CM | POA: Diagnosis not present

## 2020-04-26 DIAGNOSIS — E871 Hypo-osmolality and hyponatremia: Secondary | ICD-10-CM

## 2020-04-26 DIAGNOSIS — Z85038 Personal history of other malignant neoplasm of large intestine: Secondary | ICD-10-CM | POA: Diagnosis not present

## 2020-04-26 DIAGNOSIS — Z7901 Long term (current) use of anticoagulants: Secondary | ICD-10-CM | POA: Diagnosis not present

## 2020-04-26 DIAGNOSIS — Z5111 Encounter for antineoplastic chemotherapy: Secondary | ICD-10-CM | POA: Diagnosis not present

## 2020-04-26 DIAGNOSIS — Z9981 Dependence on supplemental oxygen: Secondary | ICD-10-CM | POA: Diagnosis not present

## 2020-04-26 DIAGNOSIS — I2699 Other pulmonary embolism without acute cor pulmonale: Secondary | ICD-10-CM | POA: Diagnosis not present

## 2020-04-26 DIAGNOSIS — E222 Syndrome of inappropriate secretion of antidiuretic hormone: Secondary | ICD-10-CM

## 2020-04-26 DIAGNOSIS — Z86711 Personal history of pulmonary embolism: Secondary | ICD-10-CM | POA: Diagnosis not present

## 2020-04-26 DIAGNOSIS — D241 Benign neoplasm of right breast: Secondary | ICD-10-CM | POA: Diagnosis not present

## 2020-04-26 DIAGNOSIS — C3411 Malignant neoplasm of upper lobe, right bronchus or lung: Secondary | ICD-10-CM | POA: Diagnosis not present

## 2020-04-26 DIAGNOSIS — C3432 Malignant neoplasm of lower lobe, left bronchus or lung: Secondary | ICD-10-CM

## 2020-04-26 DIAGNOSIS — D649 Anemia, unspecified: Secondary | ICD-10-CM | POA: Diagnosis not present

## 2020-04-26 DIAGNOSIS — J449 Chronic obstructive pulmonary disease, unspecified: Secondary | ICD-10-CM | POA: Diagnosis not present

## 2020-04-26 MED ORDER — SODIUM CHLORIDE 0.9 % IV SOLN
10.0000 mg | Freq: Once | INTRAVENOUS | Status: AC
  Administered 2020-04-26: 10 mg via INTRAVENOUS
  Filled 2020-04-26: qty 10

## 2020-04-26 MED ORDER — SODIUM CHLORIDE 0.9 % IV SOLN
73.0000 mg/m2 | Freq: Once | INTRAVENOUS | Status: AC
  Administered 2020-04-26: 160 mg via INTRAVENOUS
  Filled 2020-04-26: qty 8

## 2020-04-26 MED ORDER — SODIUM CHLORIDE 0.9 % IV SOLN
Freq: Once | INTRAVENOUS | Status: AC
  Filled 2020-04-26: qty 250

## 2020-04-26 MED ORDER — HEPARIN SOD (PORK) LOCK FLUSH 100 UNIT/ML IV SOLN
500.0000 [IU] | Freq: Once | INTRAVENOUS | Status: AC | PRN
  Administered 2020-04-26: 500 [IU]
  Filled 2020-04-26: qty 5

## 2020-04-26 NOTE — Patient Instructions (Signed)
Etoposide, VP-16 injection What is this medicine? ETOPOSIDE, VP-16 (e toe POE side) is a chemotherapy drug. It is used to treat testicular cancer, lung cancer, and other cancers. This medicine may be used for other purposes; ask your health care provider or pharmacist if you have questions. COMMON BRAND NAME(S): Etopophos, Toposar, VePesid What should I tell my health care provider before I take this medicine? They need to know if you have any of these conditions:  infection  kidney disease  liver disease  low blood counts, like low white cell, platelet, or red cell counts  an unusual or allergic reaction to etoposide, other medicines, foods, dyes, or preservatives  pregnant or trying to get pregnant  breast-feeding How should I use this medicine? This medicine is for infusion into a vein. It is administered in a hospital or clinic by a specially trained health care professional. Talk to your pediatrician regarding the use of this medicine in children. Special care may be needed. Overdosage: If you think you have taken too much of this medicine contact a poison control center or emergency room at once. NOTE: This medicine is only for you. Do not share this medicine with others. What if I miss a dose? It is important not to miss your dose. Call your doctor or health care professional if you are unable to keep an appointment. What may interact with this medicine? This medicine may interact with the following medications:  warfarin This list may not describe all possible interactions. Give your health care provider a list of all the medicines, herbs, non-prescription drugs, or dietary supplements you use. Also tell them if you smoke, drink alcohol, or use illegal drugs. Some items may interact with your medicine. What should I watch for while using this medicine? Visit your doctor for checks on your progress. This drug may make you feel generally unwell. This is not uncommon, as  chemotherapy can affect healthy cells as well as cancer cells. Report any side effects. Continue your course of treatment even though you feel ill unless your doctor tells you to stop. In some cases, you may be given additional medicines to help with side effects. Follow all directions for their use. Call your doctor or health care professional for advice if you get a fever, chills or sore throat, or other symptoms of a cold or flu. Do not treat yourself. This drug decreases your body's ability to fight infections. Try to avoid being around people who are sick. This medicine may increase your risk to bruise or bleed. Call your doctor or health care professional if you notice any unusual bleeding. Talk to your doctor about your risk of cancer. You may be more at risk for certain types of cancers if you take this medicine. Do not become pregnant while taking this medicine or for at least 6 months after stopping it. Women should inform their doctor if they wish to become pregnant or think they might be pregnant. Women of child-bearing potential will need to have a negative pregnancy test before starting this medicine. There is a potential for serious side effects to an unborn child. Talk to your health care professional or pharmacist for more information. Do not breast-feed an infant while taking this medicine. Men must use a latex condom during sexual contact with a woman while taking this medicine and for at least 4 months after stopping it. A latex condom is needed even if you have had a vasectomy. Contact your doctor right away if your partner  becomes pregnant. Do not donate sperm while taking this medicine and for at least 4 months after you stop taking this medicine. Men should inform their doctors if they wish to father a child. This medicine may lower sperm counts. What side effects may I notice from receiving this medicine? Side effects that you should report to your doctor or health care professional  as soon as possible:  allergic reactions like skin rash, itching or hives, swelling of the face, lips, or tongue  low blood counts - this medicine may decrease the number of white blood cells, red blood cells, and platelets. You may be at increased risk for infections and bleeding  nausea, vomiting  redness, blistering, peeling or loosening of the skin, including inside the mouth  signs and symptoms of infection like fever; chills; cough; sore throat; pain or trouble passing urine  signs and symptoms of low red blood cells or anemia such as unusually weak or tired; feeling faint or lightheaded; falls; breathing problems  unusual bruising or bleeding Side effects that usually do not require medical attention (report to your doctor or health care professional if they continue or are bothersome):  changes in taste  diarrhea  hair loss  loss of appetite  mouth sores This list may not describe all possible side effects. Call your doctor for medical advice about side effects. You may report side effects to FDA at 1-800-FDA-1088. Where should I keep my medicine? This drug is given in a hospital or clinic and will not be stored at home. NOTE: This sheet is a summary. It may not cover all possible information. If you have questions about this medicine, talk to your doctor, pharmacist, or health care provider.  2021 Elsevier/Gold Standard (2018-05-06 16:57:15)

## 2020-04-27 ENCOUNTER — Inpatient Hospital Stay: Payer: Medicare Other

## 2020-04-27 NOTE — Progress Notes (Signed)
Spoke with patient about applying for co-pay assistance through [employer]. She was ok with this, patient was approved. ID# [id number] Placed her on the wait list through [employer] for her Udenyca.

## 2020-04-28 ENCOUNTER — Other Ambulatory Visit: Payer: Self-pay

## 2020-04-28 ENCOUNTER — Inpatient Hospital Stay: Payer: Medicare Other

## 2020-04-28 VITALS — BP 136/68 | HR 75 | Temp 98.2°F | Resp 16 | Ht 63.0 in

## 2020-04-28 DIAGNOSIS — E039 Hypothyroidism, unspecified: Secondary | ICD-10-CM | POA: Diagnosis not present

## 2020-04-28 DIAGNOSIS — C3490 Malignant neoplasm of unspecified part of unspecified bronchus or lung: Secondary | ICD-10-CM | POA: Diagnosis not present

## 2020-04-28 DIAGNOSIS — E119 Type 2 diabetes mellitus without complications: Secondary | ICD-10-CM | POA: Diagnosis not present

## 2020-04-28 DIAGNOSIS — C3432 Malignant neoplasm of lower lobe, left bronchus or lung: Secondary | ICD-10-CM

## 2020-04-28 DIAGNOSIS — E876 Hypokalemia: Secondary | ICD-10-CM | POA: Diagnosis not present

## 2020-04-28 DIAGNOSIS — Z7901 Long term (current) use of anticoagulants: Secondary | ICD-10-CM | POA: Diagnosis not present

## 2020-04-28 DIAGNOSIS — Z9981 Dependence on supplemental oxygen: Secondary | ICD-10-CM | POA: Diagnosis not present

## 2020-04-28 DIAGNOSIS — N390 Urinary tract infection, site not specified: Secondary | ICD-10-CM | POA: Diagnosis not present

## 2020-04-28 DIAGNOSIS — Z85038 Personal history of other malignant neoplasm of large intestine: Secondary | ICD-10-CM | POA: Diagnosis not present

## 2020-04-28 DIAGNOSIS — I2699 Other pulmonary embolism without acute cor pulmonale: Secondary | ICD-10-CM | POA: Diagnosis not present

## 2020-04-28 DIAGNOSIS — E871 Hypo-osmolality and hyponatremia: Secondary | ICD-10-CM | POA: Diagnosis not present

## 2020-04-28 DIAGNOSIS — Z87891 Personal history of nicotine dependence: Secondary | ICD-10-CM | POA: Diagnosis not present

## 2020-04-28 DIAGNOSIS — H538 Other visual disturbances: Secondary | ICD-10-CM | POA: Diagnosis not present

## 2020-04-28 DIAGNOSIS — C3411 Malignant neoplasm of upper lobe, right bronchus or lung: Secondary | ICD-10-CM | POA: Diagnosis not present

## 2020-04-28 DIAGNOSIS — Z5111 Encounter for antineoplastic chemotherapy: Secondary | ICD-10-CM | POA: Diagnosis not present

## 2020-04-28 DIAGNOSIS — J449 Chronic obstructive pulmonary disease, unspecified: Secondary | ICD-10-CM | POA: Diagnosis not present

## 2020-04-28 DIAGNOSIS — D61810 Antineoplastic chemotherapy induced pancytopenia: Secondary | ICD-10-CM | POA: Diagnosis not present

## 2020-04-28 DIAGNOSIS — I2510 Atherosclerotic heart disease of native coronary artery without angina pectoris: Secondary | ICD-10-CM | POA: Diagnosis not present

## 2020-04-28 DIAGNOSIS — I110 Hypertensive heart disease with heart failure: Secondary | ICD-10-CM | POA: Diagnosis not present

## 2020-04-28 DIAGNOSIS — J9611 Chronic respiratory failure with hypoxia: Secondary | ICD-10-CM | POA: Diagnosis not present

## 2020-04-28 DIAGNOSIS — I2694 Multiple subsegmental pulmonary emboli without acute cor pulmonale: Secondary | ICD-10-CM | POA: Diagnosis not present

## 2020-04-28 DIAGNOSIS — K219 Gastro-esophageal reflux disease without esophagitis: Secondary | ICD-10-CM | POA: Diagnosis not present

## 2020-04-28 DIAGNOSIS — Z7951 Long term (current) use of inhaled steroids: Secondary | ICD-10-CM | POA: Diagnosis not present

## 2020-04-28 DIAGNOSIS — D649 Anemia, unspecified: Secondary | ICD-10-CM | POA: Diagnosis not present

## 2020-04-28 DIAGNOSIS — D241 Benign neoplasm of right breast: Secondary | ICD-10-CM | POA: Diagnosis not present

## 2020-04-28 DIAGNOSIS — E222 Syndrome of inappropriate secretion of antidiuretic hormone: Secondary | ICD-10-CM

## 2020-04-28 DIAGNOSIS — I509 Heart failure, unspecified: Secondary | ICD-10-CM | POA: Diagnosis not present

## 2020-04-28 DIAGNOSIS — M1990 Unspecified osteoarthritis, unspecified site: Secondary | ICD-10-CM | POA: Diagnosis not present

## 2020-04-28 DIAGNOSIS — Z9181 History of falling: Secondary | ICD-10-CM | POA: Diagnosis not present

## 2020-04-28 DIAGNOSIS — Z86711 Personal history of pulmonary embolism: Secondary | ICD-10-CM | POA: Diagnosis not present

## 2020-04-28 MED ORDER — PEGFILGRASTIM-CBQV 6 MG/0.6ML ~~LOC~~ SOSY
PREFILLED_SYRINGE | SUBCUTANEOUS | Status: AC
  Filled 2020-04-28: qty 0.6

## 2020-04-28 MED ORDER — PEGFILGRASTIM-CBQV 6 MG/0.6ML ~~LOC~~ SOSY
6.0000 mg | PREFILLED_SYRINGE | Freq: Once | SUBCUTANEOUS | Status: AC
  Administered 2020-04-28: 6 mg via SUBCUTANEOUS

## 2020-04-28 NOTE — Patient Instructions (Signed)
Pegfilgrastim injection What is this medicine? PEGFILGRASTIM (PEG fil gra stim) is a long-acting granulocyte colony-stimulating factor that stimulates the growth of neutrophils, a type of white blood cell important in the body's fight against infection. It is used to reduce the incidence of fever and infection in patients with certain types of cancer who are receiving chemotherapy that affects the bone marrow, and to increase survival after being exposed to high doses of radiation. This medicine may be used for other purposes; ask your health care provider or pharmacist if you have questions. COMMON BRAND NAME(S): Fulphila, Neulasta, Nyvepria, UDENYCA, Ziextenzo What should I tell my health care provider before I take this medicine? They need to know if you have any of these conditions:  kidney disease  latex allergy  ongoing radiation therapy  sickle cell disease  skin reactions to acrylic adhesives (On-Body Injector only)  an unusual or allergic reaction to pegfilgrastim, filgrastim, other medicines, foods, dyes, or preservatives  pregnant or trying to get pregnant  breast-feeding How should I use this medicine? This medicine is for injection under the skin. If you get this medicine at home, you will be taught how to prepare and give the pre-filled syringe or how to use the On-body Injector. Refer to the patient Instructions for Use for detailed instructions. Use exactly as directed. Tell your healthcare provider immediately if you suspect that the On-body Injector may not have performed as intended or if you suspect the use of the On-body Injector resulted in a missed or partial dose. It is important that you put your used needles and syringes in a special sharps container. Do not put them in a trash can. If you do not have a sharps container, call your pharmacist or healthcare provider to get one. Talk to your pediatrician regarding the use of this medicine in children. While this drug  may be prescribed for selected conditions, precautions do apply. Overdosage: If you think you have taken too much of this medicine contact a poison control center or emergency room at once. NOTE: This medicine is only for you. Do not share this medicine with others. What if I miss a dose? It is important not to miss your dose. Call your doctor or health care professional if you miss your dose. If you miss a dose due to an On-body Injector failure or leakage, a new dose should be administered as soon as possible using a single prefilled syringe for manual use. What may interact with this medicine? Interactions have not been studied. This list may not describe all possible interactions. Give your health care provider a list of all the medicines, herbs, non-prescription drugs, or dietary supplements you use. Also tell them if you smoke, drink alcohol, or use illegal drugs. Some items may interact with your medicine. What should I watch for while using this medicine? Your condition will be monitored carefully while you are receiving this medicine. You may need blood work done while you are taking this medicine. Talk to your health care provider about your risk of cancer. You may be more at risk for certain types of cancer if you take this medicine. If you are going to need a MRI, CT scan, or other procedure, tell your doctor that you are using this medicine (On-Body Injector only). What side effects may I notice from receiving this medicine? Side effects that you should report to your doctor or health care professional as soon as possible:  allergic reactions (skin rash, itching or hives, swelling of   the face, lips, or tongue)  back pain  dizziness  fever  pain, redness, or irritation at site where injected  pinpoint red spots on the skin  red or dark-brown urine  shortness of breath or breathing problems  stomach or side pain, or pain at the shoulder  swelling  tiredness  trouble  passing urine or change in the amount of urine  unusual bruising or bleeding Side effects that usually do not require medical attention (report to your doctor or health care professional if they continue or are bothersome):  bone pain  muscle pain This list may not describe all possible side effects. Call your doctor for medical advice about side effects. You may report side effects to FDA at 1-800-FDA-1088. Where should I keep my medicine? Keep out of the reach of children. If you are using this medicine at home, you will be instructed on how to store it. Throw away any unused medicine after the expiration date on the label. NOTE: This sheet is a summary. It may not cover all possible information. If you have questions about this medicine, talk to your doctor, pharmacist, or health care provider.  2021 Elsevier/Gold Standard (2019-04-02 13:20:51)  

## 2020-05-01 DIAGNOSIS — J449 Chronic obstructive pulmonary disease, unspecified: Secondary | ICD-10-CM | POA: Diagnosis not present

## 2020-05-02 DIAGNOSIS — I2694 Multiple subsegmental pulmonary emboli without acute cor pulmonale: Secondary | ICD-10-CM | POA: Diagnosis not present

## 2020-05-02 DIAGNOSIS — Z87891 Personal history of nicotine dependence: Secondary | ICD-10-CM | POA: Diagnosis not present

## 2020-05-02 DIAGNOSIS — D61810 Antineoplastic chemotherapy induced pancytopenia: Secondary | ICD-10-CM | POA: Diagnosis not present

## 2020-05-02 DIAGNOSIS — E876 Hypokalemia: Secondary | ICD-10-CM | POA: Diagnosis not present

## 2020-05-02 DIAGNOSIS — E039 Hypothyroidism, unspecified: Secondary | ICD-10-CM | POA: Diagnosis not present

## 2020-05-02 DIAGNOSIS — K219 Gastro-esophageal reflux disease without esophagitis: Secondary | ICD-10-CM | POA: Diagnosis not present

## 2020-05-02 DIAGNOSIS — Z7951 Long term (current) use of inhaled steroids: Secondary | ICD-10-CM | POA: Diagnosis not present

## 2020-05-02 DIAGNOSIS — E119 Type 2 diabetes mellitus without complications: Secondary | ICD-10-CM | POA: Diagnosis not present

## 2020-05-02 DIAGNOSIS — Z9181 History of falling: Secondary | ICD-10-CM | POA: Diagnosis not present

## 2020-05-02 DIAGNOSIS — J9611 Chronic respiratory failure with hypoxia: Secondary | ICD-10-CM | POA: Diagnosis not present

## 2020-05-02 DIAGNOSIS — I509 Heart failure, unspecified: Secondary | ICD-10-CM | POA: Diagnosis not present

## 2020-05-02 DIAGNOSIS — C3490 Malignant neoplasm of unspecified part of unspecified bronchus or lung: Secondary | ICD-10-CM | POA: Diagnosis not present

## 2020-05-02 DIAGNOSIS — E871 Hypo-osmolality and hyponatremia: Secondary | ICD-10-CM | POA: Diagnosis not present

## 2020-05-02 DIAGNOSIS — M1990 Unspecified osteoarthritis, unspecified site: Secondary | ICD-10-CM | POA: Diagnosis not present

## 2020-05-02 DIAGNOSIS — J449 Chronic obstructive pulmonary disease, unspecified: Secondary | ICD-10-CM | POA: Diagnosis not present

## 2020-05-02 DIAGNOSIS — Z85038 Personal history of other malignant neoplasm of large intestine: Secondary | ICD-10-CM | POA: Diagnosis not present

## 2020-05-02 DIAGNOSIS — N390 Urinary tract infection, site not specified: Secondary | ICD-10-CM | POA: Diagnosis not present

## 2020-05-02 DIAGNOSIS — I2510 Atherosclerotic heart disease of native coronary artery without angina pectoris: Secondary | ICD-10-CM | POA: Diagnosis not present

## 2020-05-02 DIAGNOSIS — H538 Other visual disturbances: Secondary | ICD-10-CM | POA: Diagnosis not present

## 2020-05-02 DIAGNOSIS — I110 Hypertensive heart disease with heart failure: Secondary | ICD-10-CM | POA: Diagnosis not present

## 2020-05-03 NOTE — Progress Notes (Incomplete)
[facility]  [address] [place],  [place]  [address] [phone]  Clinic Day:  05/03/2020  Referring physician: Helen Hashimoto., MD    CHIEF COMPLAINT:  CC:  Small cell carcinoma of the left hilar area   Current Treatment:  Carboplatin/etoposide   HISTORY OF PRESENT ILLNESS:  Gabrielle Hudson is a 80 y.o. female with stage IIB non-small-cell lung cancer of the right upper lobe diagnosed in November 2010.  She had a large endobronchial component causing obstruction, so underwent laser therapy at [facility] with a good response.  Due to severe emphysema and the location of the tumor being within 2 cm of the carina, she was not a surgical candidate.  She was treated with concurrent radiation and chemotherapy with carboplatin and paclitaxel, followed by an additional 2 months of carboplatin and paclitaxel with a good response.  She had bilateral pneumonia in December 2012.  CT scan at that time revealed some abnormality concerning for recurrence, so a PET scan was obtained in January 2013.  There was mild hypermetabolic activity in the right hilum, but the patient opted for observation.  Repeat CT scans had been stable, with scarring of the posterior right upper lobe.  She also has a history of a stage I adenocarcinoma of the colon, arising from a tubulovillous adenoma.  Her colonoscopy in August 2012 had removal of multiple benign polyps.  Her last mammogram was in December 2017.  She had her Port-A-Cath removed in May 2018. She quit smoking about 11 years ago.  CT chest in December 2018 revealed progression of amorphous soft tissue attenuation of the right hilum.  There was no definite lymphadenopathy.  There were stable scattered tiny pulmonary nodules all less than 5 mm.  There is evidence of emphysema.  There is a stable right liver lesion measuring 2.5 cm which was unchanged for over 5 years.  CT chest in March 2019 and September  2019 were stable, with soft tissue attenuation is in the area of previous radiation.  CT chest in October 2020 revealed all previously noted pulmonary nodules measuring  4 mm or less in size are stable compared to the prior examination, and are considered definitively benign. She also underwent bilateral diagnostic mammogram and right breast ultrasound which revealed the likely benign mass in the right breast at 2:30 is stable. CEA had decreased from 4.6 to 4.4.    She was referred back from [facility] in October 2021 after diagnosis of a new small cell lung cancer.  She presented with hemoptysis and pain of the left lower thorax.  CT revealed tumor encasing and narrowing the left lower lobe pulmonary artery and the left lower lobe bronchus.  There is a mass of the left hilum measuring 5.2 cm with left lower lobe atelectasis.  She also has moderate emphysema and mild cardiomegaly, with evidence of coronary artery disease.  Biopsy in October revealed small cell carcinoma with crush artifact.  She had associated hyponatremia as low as 125 and required demeclocycline. She had been hospitalized several times in August of this year for hyponatremia before her new lung cancer was diagnosed.  In retrospect, the left hilar cancer was not obvious on chest x-ray.  She was found to be iron deficient and her potassium was down to 3.0. She was placed on oral iron and potassium supplementation.  Baseline CEA was 14.1.  She received 3 cycles of carboplatin/etoposide and has problems with hypomagnesemia  Her CEA had increased in November to 48.3 from 14.1 in October, so we were concerned she may not be responding.  After 3 cycles of carboplatin/etoposide, she was hospitalized  In late December with severe weakness and diarrhea.  She was found to have severe neutropenia, which worsened during hospitalization to as low as 0.5 with an ANC of 80, despite Neulasta.  Her white blood count finally improved to 1.5 with  an Marion of 650.  Her hemoglobin was down to 7. Her platelets steadily decreased daily from 140,000 at admission down to 17,000.  She denied bleeding, but did have severe bruising.  She received platelets and PRBC's during hospitalization.  Her platelets were 50,000 on discharge.  She also had a UTI with Klebsiella Pneumoniae, which was resistant only to ampicillin and was treated with IV ceftrixone.  CT chest in the emergency department revealed extensive bilateral lower lobe segmental pulmonary emboli (almost occlusive in the left lower lobe), so she was placed on heparin.  She was discharged on January 4th on apixaban 5 mg BID.  We reviewed the previous imaging reports and there was good shrinkage of the tumor.    When she was seen on January 6th, her white count was elevated, hemoglobin improved and platelets had normalized. Her CEA was fairly stable at 42.5.  We would like to attempt at least a 4th cycle with reduction in doses.  She is willing to continue with treatment.  However, we are concerned that she may not be able to tolerate further chemotherapy.   INTERVAL HISTORY:  She is here prior to potentially resuming carboplatin/etoposide. She is using a motorized wheelchair now.  She has chronic dyspnea with exertion, which is stable.  She is  on oxygen 2 L per nasal cannula.  She denies cough or chest pain.  She denies progressive fatigue concerning for recurrent anemia.  She denies any overt form of bleeding. She reports constipation, but has not taken anything for this. She denies fevers or chills. She denies pain.  Her  appetite is good. Her weight has increased 3 pounds over last 2 weeks.  She is here for routine follow up to see how she tolerated resuming carboplatin/etoposide.   Her  appetite is good, and she has gained/lost _ pounds since her last visit.  She denies fever, chills or other signs of infection.  She denies nausea, vomiting, bowel issues, or abdominal pain.  She denies sore throat,  cough, dyspnea, or chest pain.  REVIEW OF SYSTEMS:  Review of Systems - Oncology   VITALS:  There were no vitals taken for this visit.  Wt Readings from Last 3 Encounters:  04/24/20 220 lb (99.8 kg)  04/19/20 219 lb 11.2 oz (99.7 kg)  03/30/20 216 lb 3.2 oz (98.1 kg)    There is no height or weight on file to calculate BMI.  Performance status (ECOG): 2 - Symptomatic, <50% confined to bed  PHYSICAL EXAM:  Physical Exam   LABS:   CBC Latest Ref Rng & Units 04/19/2020 03/30/2020 03/09/2020  WBC - 5.9 39.4 32.7  Hemoglobin 12.0 - 16.0 9.8(A) 11.2(A) 8.8(A)  Hematocrit 36 - 46 30(A) 35(A) 28(A)  Platelets 150 - 399 231 153 198   CMP Latest Ref Rng & Units 04/19/2020 03/30/2020 03/09/2020  Glucose 70 - 99 mg/dL - - -  BUN 4 - 21 12 13 13   Creatinine 0.5 - 1.1 1.0 0.8 1.0  Sodium 137 - 147 135(A) 130(A) 138  Potassium 3.4 - 5.3  4.1 4.5 4.0  Chloride 99 - 108 100 93(A) 98(A)  CO2 13 - 22 29(A) 30(A) 28(A)  Calcium 8.7 - 10.7 9.3 8.4(A) 8.1(A)  Total Protein 6.5 - 8.1 g/dL - - -  Total Bilirubin 0.3 - 1.2 mg/dL - - -  Alkaline Phos 25 - 125 122 163(A) 189(A)  AST 13 - 35 22 42(A) 39(A)  ALT 7 - 35 22 32 35     Lab Results  Component Value Date   CEA1 42.5 (H) 03/31/2020   /  CEA  Date Value Ref Range Status  03/31/2020 42.5 (H) 0.0 - 4.7 ng/mL Final    Comment:    (NOTE)                             Nonsmokers          <3.9                             Smokers             <5.6 [employer] Electrochemiluminescence Immunoassay (ECLIA) Values obtained with different assay methods or kits cannot be used interchangeably.  Results cannot be interpreted as absolute evidence of the presence or absence of malignant disease. Performed At: [facility] [place], [place] [id number] Rush Farmer MD JJ:[id number]      Lab Results  Component Value Date   TOTALPROTELP 4.7 (L) 03/26/2011   ALBUMINELP 52.5 (L) 03/26/2011   A1GS 8.7 (H) 03/26/2011    A2GS 18.8 (H) 03/26/2011   BETS 4.5 (L) 03/26/2011   BETA2SER 4.7 03/26/2011   GAMS 10.8 (L) 03/26/2011   MSPIKE NOT DETECTED 03/26/2011   SPEI (NOTE) 03/26/2011   Lab Results  Component Value Date   TIBC 226 04/19/2020   TIBC 219 (L) 03/29/2011   FERRITIN 361 04/19/2020   FERRITIN 356 (H) 03/29/2011   IRONPCTSAT 44.2 04/19/2020   IRONPCTSAT 65 (H) 03/29/2011   Lab Results  Component Value Date   LDH 349 (H) 03/26/2011     STUDIES:    Allergies:  Allergies  Allergen Reactions  . Nitroglycerin In D5w Other (See Comments)    "flatlines"  . Nitroglycerin Other (See Comments)    Drops her BP 'flatlines'     Current Medications: Current Outpatient Medications  Medication Sig Dispense Refill  . albuterol (PROVENTIL) (5 MG/ML) 0.5% nebulizer solution Take 2.5 mg by nebulization daily.    Marland Kitchen apixaban (ELIQUIS) 5 MG TABS tablet Take 1 tablet (5 mg total) by mouth 2 (two) times daily. 60 tablet 1  . budesonide-formoterol (SYMBICORT) 160-4.5 MCG/ACT inhaler Inhale 2 puffs into the lungs 2 (two) times daily.      . Calcium Carbonate-Vitamin D (CALCIUM + D PO) Take 1 tablet by mouth daily.      . calcium citrate-vitamin D (CITRACAL+D) 315-200 MG-UNIT tablet Take 1 tablet by mouth 2 (two) times daily.    . Cholecalciferol (VITAMIN D3) 1.25 MG (50000 UT) CAPS     . Cholecalciferol 25 MCG (1000 UT) tablet Take by mouth.    . cyclobenzaprine (FLEXERIL) 10 MG tablet Take by mouth.    . diltiazem (CARDIZEM CD) 120 MG 24 hr capsule Take 120 mg by mouth daily.    . famotidine (PEPCID) 20 MG tablet Take 20 mg by mouth 2 (two) times daily.    . furosemide (LASIX) 20 MG tablet Take 20  mg by mouth as needed. ONLY TAKES IT HAVING SWELLING    . gabapentin (NEURONTIN) 300 MG capsule Take 300 mg by mouth as needed.    Marland Kitchen guaiFENesin (MUCINEX) 600 MG 12 hr tablet Take 1 tablet by mouth every 12 (twelve) hours.    Marland Kitchen HYDROcodone-acetaminophen (NORCO) 7.5-325 MG tablet Take 1 tablet by mouth every 4  (four) hours as needed for moderate pain. 120 tablet 0  . Levothyroxine Sodium 88 MCG CAPS Take 88 mcg by mouth daily.    . magnesium oxide (MAG-OX) 400 (241.3 Mg) MG tablet Take 1 tablet (400 mg total) by mouth daily. 30 tablet 5  . ondansetron (ZOFRAN) 4 MG tablet Take 1 tablet (4 mg total) by mouth every 4 (four) hours as needed for nausea. 90 tablet 3  . OXYGEN Inhale 4 L into the lungs continuous.    . pantoprazole (PROTONIX) 20 MG tablet Take by mouth.    . potassium chloride SA (KLOR-CON) 20 MEQ tablet Take 20 mEq by mouth 2 (two) times daily.    . prochlorperazine (COMPAZINE) 10 MG tablet Take 1 tablet (10 mg total) by mouth every 6 (six) hours as needed for nausea or vomiting. 90 tablet 3  . sodium chloride 1 g tablet Take by mouth.    . TRELEGY ELLIPTA 100-62.5-25 MCG/INH AEPB Inhale 1 puff into the lungs daily.    . Vitamin D, Ergocalciferol, (DRISDOL) 1.25 MG (50000 UNIT) CAPS capsule Take 50,000 Units by mouth once a week.     No current facility-administered medications for this visit.     ASSESSMENT & PLAN:   Assessment:   1. History of stage IIB non-small cell lung cancer in November 2010.  She remains without evidence of recurrent or metastatic disease.  2. History of stage I colon cancer.  She has not been compliant with follow-up colonoscopy.   3. Benign mass of the right breast at 2:30, which we will continue to monitor.  4. Severe oxygen dependant COPD.  She continues 4 L of oxygen per nasal cannula.  5. 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 Recent CT imaging revealed a good response, still her CEA remained elevated.   6. Hyponatremia secondary to SIADH, improved.  7. Worsening anemia, normochromic, normocytic.  Iron studies were consistent with deficiency in October.  Repeat iron studies, B12 and folate  Did not reveal any evidence of nutritional deficiency. This is most likely anemia of chronic  disease and cancer.  8. Hypokalemia, resolved with KCL 20 meq twice daily.  9. Bilateral pulmonary emboli, diagnosed in December 2021.  She continues apixaban 5 mg BID.    10.  Hypomagnesemia , resolved.  11. Hypothyroidism, she is on levothyroxine 88 mcg daily.  Her TSH is elevated, I will forward this to her primary care provider.  Plan:   As she has returned to baseline, we will plan to resume chemotherapy with carboplatin/etoposide for at least 1 more cycle, for a total of 4 cycles, but at a reduced dose of 25-30%.  She will receive Neulasta to prevent complications of prolonged neutropenia.   Will plan to see her back in 10 days with a CBC, comprehensive metabolic panel and magnesium to see how she tolerated treatment.  The patient understands the plans discussed today and is in agreement with them.  She knows to contact our office if she develops concerns prior to her next appointment.  Derwood Kaplan, MD [facility] [address] [place] [address] Dept: [phone] Dept Fax: [phone]

## 2020-05-04 DIAGNOSIS — I110 Hypertensive heart disease with heart failure: Secondary | ICD-10-CM | POA: Diagnosis not present

## 2020-05-04 DIAGNOSIS — Z9181 History of falling: Secondary | ICD-10-CM | POA: Diagnosis not present

## 2020-05-04 DIAGNOSIS — E119 Type 2 diabetes mellitus without complications: Secondary | ICD-10-CM | POA: Diagnosis not present

## 2020-05-04 DIAGNOSIS — E876 Hypokalemia: Secondary | ICD-10-CM | POA: Diagnosis not present

## 2020-05-04 DIAGNOSIS — N390 Urinary tract infection, site not specified: Secondary | ICD-10-CM | POA: Diagnosis not present

## 2020-05-04 DIAGNOSIS — E039 Hypothyroidism, unspecified: Secondary | ICD-10-CM | POA: Diagnosis not present

## 2020-05-04 DIAGNOSIS — Z85038 Personal history of other malignant neoplasm of large intestine: Secondary | ICD-10-CM | POA: Diagnosis not present

## 2020-05-04 DIAGNOSIS — Z87891 Personal history of nicotine dependence: Secondary | ICD-10-CM | POA: Diagnosis not present

## 2020-05-04 DIAGNOSIS — C3490 Malignant neoplasm of unspecified part of unspecified bronchus or lung: Secondary | ICD-10-CM | POA: Diagnosis not present

## 2020-05-04 DIAGNOSIS — D61810 Antineoplastic chemotherapy induced pancytopenia: Secondary | ICD-10-CM | POA: Diagnosis not present

## 2020-05-04 DIAGNOSIS — M1990 Unspecified osteoarthritis, unspecified site: Secondary | ICD-10-CM | POA: Diagnosis not present

## 2020-05-04 DIAGNOSIS — J449 Chronic obstructive pulmonary disease, unspecified: Secondary | ICD-10-CM | POA: Diagnosis not present

## 2020-05-04 DIAGNOSIS — I2694 Multiple subsegmental pulmonary emboli without acute cor pulmonale: Secondary | ICD-10-CM | POA: Diagnosis not present

## 2020-05-04 DIAGNOSIS — H538 Other visual disturbances: Secondary | ICD-10-CM | POA: Diagnosis not present

## 2020-05-04 DIAGNOSIS — I509 Heart failure, unspecified: Secondary | ICD-10-CM | POA: Diagnosis not present

## 2020-05-04 DIAGNOSIS — K219 Gastro-esophageal reflux disease without esophagitis: Secondary | ICD-10-CM | POA: Diagnosis not present

## 2020-05-04 DIAGNOSIS — J9611 Chronic respiratory failure with hypoxia: Secondary | ICD-10-CM | POA: Diagnosis not present

## 2020-05-04 DIAGNOSIS — Z7951 Long term (current) use of inhaled steroids: Secondary | ICD-10-CM | POA: Diagnosis not present

## 2020-05-04 DIAGNOSIS — E871 Hypo-osmolality and hyponatremia: Secondary | ICD-10-CM | POA: Diagnosis not present

## 2020-05-04 DIAGNOSIS — I2510 Atherosclerotic heart disease of native coronary artery without angina pectoris: Secondary | ICD-10-CM | POA: Diagnosis not present

## 2020-05-05 ENCOUNTER — Other Ambulatory Visit: Payer: Self-pay

## 2020-05-05 ENCOUNTER — Inpatient Hospital Stay: Payer: Medicare Other | Admitting: Oncology

## 2020-05-05 ENCOUNTER — Inpatient Hospital Stay (INDEPENDENT_AMBULATORY_CARE_PROVIDER_SITE_OTHER): Payer: Medicare Other | Admitting: Hematology and Oncology

## 2020-05-05 ENCOUNTER — Encounter: Payer: Self-pay | Admitting: Hematology and Oncology

## 2020-05-05 ENCOUNTER — Telehealth: Payer: Self-pay | Admitting: Hematology and Oncology

## 2020-05-05 ENCOUNTER — Inpatient Hospital Stay: Payer: Medicare Other

## 2020-05-05 VITALS — BP 160/69 | HR 99 | Temp 98.4°F | Resp 20 | Ht 63.0 in | Wt 220.5 lb

## 2020-05-05 DIAGNOSIS — C3432 Malignant neoplasm of lower lobe, left bronchus or lung: Secondary | ICD-10-CM | POA: Diagnosis not present

## 2020-05-05 DIAGNOSIS — D649 Anemia, unspecified: Secondary | ICD-10-CM | POA: Diagnosis not present

## 2020-05-05 LAB — COMPREHENSIVE METABOLIC PANEL
Albumin: 3.8 (ref 3.5–5.0)
Calcium: 8.5 — AB (ref 8.7–10.7)

## 2020-05-05 LAB — HEPATIC FUNCTION PANEL
ALT: 16 (ref 7–35)
AST: 15 (ref 13–35)
Alkaline Phosphatase: 109 (ref 25–125)
Bilirubin, Total: 0.5

## 2020-05-05 LAB — CBC AND DIFFERENTIAL
HCT: 29 — AB (ref 36–46)
Hemoglobin: 9.9 — AB (ref 12.0–16.0)
Neutrophils Absolute: 1.53
Platelets: 93 — AB (ref 150–399)
WBC: 3

## 2020-05-05 LAB — TSH: TSH: 7.37 — AB (ref 0.41–5.90)

## 2020-05-05 LAB — BASIC METABOLIC PANEL
BUN: 14 (ref 4–21)
CO2: 26 — AB (ref 13–22)
Chloride: 99 (ref 99–108)
Creatinine: 0.9 (ref 0.5–1.1)
Glucose: 106
Potassium: 3.7 (ref 3.4–5.3)
Sodium: 134 — AB (ref 137–147)

## 2020-05-05 LAB — CBC
MCV: 92 (ref 81–99)
RBC: 3.18 — AB (ref 3.87–5.11)

## 2020-05-05 LAB — MAGNESIUM: Magnesium: 1.7

## 2020-05-05 NOTE — Progress Notes (Signed)
[place]  [address] [place],  [place]  [address] [phone]  Clinic Day:  05/05/2020  Referring physician: Helen Hashimoto., MD    CHIEF COMPLAINT:  CC:  Small cell carcinoma of the left hilar area   Current Treatment:  Carboplatin/etoposide   HISTORY OF PRESENT ILLNESS:  Gabrielle Hudson is a 80 y.o. female with stage IIB non-small-cell lung cancer of the right upper lobe diagnosed in November 2010.  She had a large endobronchial component causing obstruction, so underwent laser therapy at [facility] with a good response.  Due to severe emphysema and the location of the tumor being within 2 cm of the carina, she was not a surgical candidate.  She was treated with concurrent radiation and chemotherapy with carboplatin and paclitaxel, followed by an additional 2 months of carboplatin and paclitaxel with a good response.  She had bilateral pneumonia in December 2012.  CT scan at that time revealed some abnormality concerning for recurrence, so a PET scan was obtained in January 2013.  There was mild hypermetabolic activity in the right hilum, but the patient opted for observation.  Repeat CT scans had been stable, with scarring of the posterior right upper lobe.  She also has a history of a stage I adenocarcinoma of the colon, arising from a tubulovillous adenoma.  Her colonoscopy in August 2012 had removal of multiple benign polyps.  Her last mammogram was in December 2017.  She had her Port-A-Cath removed in May 2018. She quit smoking about 11 years ago.  CT chest in December 2018 revealed progression of amorphous soft tissue attenuation of the right hilum.  There was no definite lymphadenopathy.  There were stable scattered tiny pulmonary nodules all less than 5 mm.  There is evidence of emphysema.  There is a stable right liver lesion measuring 2.5 cm which was unchanged for over 5 years.  CT chest in March of 2019 and  September of 2019 were stable, with soft tissue attenuation is in the area of previous radiation.  CT chest in October of 2020 revealed all previously noted pulmonary nodules measuring  4 mm or less in size are stable compared to the prior examination, and are considered definitively benign. She also underwent bilateral diagnostic mammogram and right breast ultrasound which revealed the likely benign mass in the right breast at 2:30 is stable. CEA had decreased from 4.6 to 4.4.    She was referred back from [facility] in October 2021 after diagnosis of a new small cell lung cancer.  She presented with hemoptysis and pain of the left lower thorax.  CT revealed tumor encasing and narrowing the left lower lobe pulmonary artery and the left lower lobe bronchus.  There is a mass of the left hilum measuring 5.2 cm with left lower lobe atelectasis.  She also has moderate emphysema and mild cardiomegaly, with evidence of coronary artery disease.  Biopsy on October 19th revealed small cell carcinoma with crush artifact.  She had associated hyponatremia as low as 125, and required demeclocycline.  She was also placed on prednisone, 40 mg daily for 5 days, then 20 mg daily.  She complains of occasional cough productive of creamy colored sputum and occasional wheezing but no further hemoptysis.  She is using a lidoderm patch for her left sided chest pain with partial relief.  She had been hospitalized several times in August of this year for hyponatremia before her new lung cancer was  diagnosed.  In retrospect, the left hilar cancer was not obvious on chest x-ray.  She was found to be iron deficient and her potassium was down to 3.0.  Baseline CEA was 14.1.  She received 3 cycles of carboplatin/etoposide and has problems with hypomagnesemia  Her CEA had increased in November to 48.3 from 14.1 in October, so we were concerned she may not be responding.  After 3 cycles of carboplatin/etoposide, she was  hospitalized with severe weakness and diarrhea.  She was found to have severe neutropenia, which worsened during hospitalization to as low as 0.5 with an ANC of 80, despite Neulasta.  Her white blood count finally improved to 1.5 with an Bluford of 650.  Her hemoglobin was down to 7. Her platelets steadily decreased daily from 140,000 at admission down to 17,000.  She denied bleeding, but did have severe bruising.  She received platelets and PRBC's during hospitalization.  Her platelets were 50,000 on discharge.  She also had a UTI with Klebsiella Pneumoniae, which was resistant only to ampicillin and was treated with IV ceftrixone.  CT  chest in the emergency department revealed extensive bilateral lower lobe segmental pulmonary emboli (almost occlusive in the left lower lobe), so she was placed on heparin.  She was discharged on January 4th on apixaban 5 mg twice a day.  We reviewed the previous imaging reports and there was good shrinkage of the tumor.  When she was seen on January 6th, her white count was elevated, hemoglobin improved and platelets had normalized. Her CEA was fairly stable at 42.5.  We wanted to give at least a 4th cycle of chemotherapy with reduction in doses. However, we are concerned that she may not be able to tolerate further chemotherapy.  She received a 4th cycle of carboplatin/etoposide beginning January 31st with a 25% dose reduction.  INTERVAL HISTORY:  She is here for supportive care while on chemotherapy. She states she tolerated her 4th cycle of chemotherapy without significant difficulty.  She continues to use her motorized wheelchair. She reports stable dry cough and dyspnea with exertion.  She has been trying to wean herself off of oxygen during the day.  She has a home pulse oximeter and keeps her oxygen level above 95%.  She continues apixaban 5 mg twice daily. She asks how we will tell about the cancer in the blood clots.  I told her we would repeat CT scans, but not right away  unless she was having problems. She denies fevers or chills. She denies pain.  Herappetite is good.  Her weight has been stable. She is alone for the visit today.  She still has home health services. She requests a TSH today for her primary care provider.  REVIEW OF SYSTEMS:  Review of Systems  Constitutional: Negative for appetite change, chills, fatigue, fever and unexpected weight change.  HENT:   Negative for lump/mass, mouth sores and sore throat.   Respiratory: Positive for cough (chronic, stable) and shortness of breath (chronic, stable).   Cardiovascular: Negative for chest pain and leg swelling.  Gastrointestinal: Negative for abdominal pain, constipation, diarrhea, nausea and vomiting.  Genitourinary: Negative for difficulty urinating, dysuria, frequency and hematuria.   Musculoskeletal: Negative for arthralgias, back pain and myalgias.  Skin: Negative for rash.  Neurological: Negative for dizziness and headaches.  Hematological: Negative for adenopathy. Does not bruise/bleed easily.  Psychiatric/Behavioral: Negative for depression and sleep disturbance. The patient is not nervous/anxious.      VITALS:  Blood pressure (!) 160/69,  pulse 99, temperature 98.4 F (36.9 C), temperature source Oral, resp. rate 20, height 5\' 3"  (1.6 m), weight 220 lb 8 oz (100 kg), SpO2 95 %.  Wt Readings from Last 3 Encounters:  05/05/20 220 lb 8 oz (100 kg)  04/24/20 220 lb (99.8 kg)  04/19/20 219 lb 11.2 oz (99.7 kg)    Body mass index is 39.06 kg/m.  Performance status (ECOG): 2 - Symptomatic, <50% confined to bed  PHYSICAL EXAM:  Physical Exam Vitals and nursing note reviewed.  Constitutional:      Appearance: Normal appearance.  HENT:     Mouth/Throat:     Mouth: Mucous membranes are moist.     Pharynx: Oropharynx is clear. No oropharyngeal exudate or posterior oropharyngeal erythema.  Eyes:     General: No scleral icterus.    Extraocular Movements: Extraocular movements intact.      Conjunctiva/sclera: Conjunctivae normal.     Pupils: Pupils are equal, round, and reactive to light.  Cardiovascular:     Rate and Rhythm: Normal rate and regular rhythm.     Heart sounds: Normal heart sounds. No murmur heard. No friction rub. No gallop.   Pulmonary:     Effort: No respiratory distress.     Breath sounds: Normal breath sounds. No stridor. No wheezing, rhonchi or rales.  Chest:  Breasts:     Right: No axillary adenopathy or supraclavicular adenopathy.     Left: No axillary adenopathy or supraclavicular adenopathy.    Abdominal:     General: There is no distension.     Palpations: Abdomen is soft. There is no hepatomegaly, splenomegaly or mass.     Tenderness: There is no abdominal tenderness. There is no guarding.  Musculoskeletal:        General: Normal range of motion.     Cervical back: Normal range of motion and neck supple. No tenderness.     Right lower leg: No edema.     Left lower leg: No edema.  Lymphadenopathy:     Cervical: No cervical adenopathy.     Upper Body:     Right upper body: No supraclavicular or axillary adenopathy.     Left upper body: No supraclavicular or axillary adenopathy.     Lower Body: No right inguinal adenopathy. No left inguinal adenopathy.  Skin:    General: Skin is warm and dry.     Coloration: Skin is not jaundiced.     Findings: No rash.  Neurological:     Mental Status: She is alert and oriented to person, place, and time.     Cranial Nerves: No cranial nerve deficit.     Motor: Weakness (general weakness) present.  Psychiatric:        Mood and Affect: Mood normal.        Behavior: Behavior normal.        Thought Content: Thought content normal.     LABS:   CBC Latest Ref Rng & Units 05/05/2020 04/19/2020 03/30/2020  WBC - 3.0 5.9 39.4  Hemoglobin 12.0 - 16.0 9.9(A) 9.8(A) 11.2(A)  Hematocrit 36 - 46 29(A) 30(A) 35(A)  Platelets 150 - 399 93(A) 231 153   CMP Latest Ref Rng & Units 05/05/2020 04/19/2020 03/30/2020   Glucose 70 - 99 mg/dL - - -  BUN 4 - 21 14 12 13   Creatinine 0.5 - 1.1 0.9 1.0 0.8  Sodium 137 - 147 134(A) 135(A) 130(A)  Potassium 3.4 - 5.3 3.7 4.1 4.5  Chloride 99 - 108 99  100 93(A)  CO2 13 - 22 26(A) 29(A) 30(A)  Calcium 8.7 - 10.7 8.5(A) 9.3 8.4(A)  Total Protein 6.5 - 8.1 g/dL - - -  Total Bilirubin 0.3 - 1.2 mg/dL - - -  Alkaline Phos 25 - 125 109 122 163(A)  AST 13 - 35 15 22 42(A)  ALT 7 - 35 16 22 32   CEA 22.6 on 05/05/2020  Lab Results  Component Value Date   CEA1 42.5 (H) 03/31/2020   /  CEA  Date Value Ref Range Status  03/31/2020 42.5 (H) 0.0 - 4.7 ng/mL Final    Comment:    (NOTE)                             Nonsmokers          <3.9                             Smokers             <5.6 [employer] Electrochemiluminescence Immunoassay (ECLIA) Values obtained with different assay methods or kits cannot be used interchangeably.  Results cannot be interpreted as absolute evidence of the presence or absence of malignant disease. Performed At: [facility] [place], [place] [id number] Rush Farmer MD [id number]      Lab Results  Component Value Date   TOTALPROTELP 4.7 (L) 03/26/2011   ALBUMINELP 52.5 (L) 03/26/2011   A1GS 8.7 (H) 03/26/2011   A2GS 18.8 (H) 03/26/2011   BETS 4.5 (L) 03/26/2011   BETA2SER 4.7 03/26/2011   GAMS 10.8 (L) 03/26/2011   MSPIKE NOT DETECTED 03/26/2011   SPEI (NOTE) 03/26/2011   Lab Results  Component Value Date   TIBC 226 04/19/2020   TIBC 219 (L) 03/29/2011   FERRITIN 361 04/19/2020   FERRITIN 356 (H) 03/29/2011   IRONPCTSAT 44.2 04/19/2020   IRONPCTSAT 65 (H) 03/29/2011   Lab Results  Component Value Date   LDH 349 (H) 03/26/2011     STUDIES:    Allergies:  Allergies  Allergen Reactions  . Nitroglycerin In D5w Other (See Comments)    "flatlines"  . Nitroglycerin Other (See Comments)    Drops her BP 'flatlines'     Current Medications: Current Outpatient  Medications  Medication Sig Dispense Refill  . albuterol (PROVENTIL) (5 MG/ML) 0.5% nebulizer solution Take 2.5 mg by nebulization daily.    Marland Kitchen apixaban (ELIQUIS) 5 MG TABS tablet Take 1 tablet (5 mg total) by mouth 2 (two) times daily. 60 tablet 1  . budesonide-formoterol (SYMBICORT) 160-4.5 MCG/ACT inhaler Inhale 2 puffs into the lungs 2 (two) times daily.      . Calcium Carbonate-Vitamin D (CALCIUM + D PO) Take 1 tablet by mouth daily.      . calcium citrate-vitamin D (CITRACAL+D) 315-200 MG-UNIT tablet Take 1 tablet by mouth 2 (two) times daily.    . Cholecalciferol (VITAMIN D3) 1.25 MG (50000 UT) CAPS     . Cholecalciferol 25 MCG (1000 UT) tablet Take by mouth.    . cyclobenzaprine (FLEXERIL) 10 MG tablet Take by mouth.    . diltiazem (CARDIZEM CD) 120 MG 24 hr capsule Take 120 mg by mouth daily.    . EUTHYROX 88 MCG tablet Take 88 mcg by mouth daily.    . famotidine (PEPCID) 20 MG tablet Take 20 mg by mouth 2 (two) times daily.    Marland Kitchen  furosemide (LASIX) 20 MG tablet Take 20 mg by mouth as needed. ONLY TAKES IT HAVING SWELLING    . gabapentin (NEURONTIN) 300 MG capsule Take 300 mg by mouth as needed.    Marland Kitchen guaiFENesin (MUCINEX) 600 MG 12 hr tablet Take 1 tablet by mouth every 12 (twelve) hours.    Marland Kitchen HYDROcodone-acetaminophen (NORCO) 7.5-325 MG tablet Take 1 tablet by mouth every 4 (four) hours as needed for moderate pain. 120 tablet 0  . Levothyroxine Sodium 88 MCG CAPS Take 88 mcg by mouth daily.    . magnesium oxide (MAG-OX) 400 (241.3 Mg) MG tablet Take 1 tablet (400 mg total) by mouth daily. 30 tablet 5  . ondansetron (ZOFRAN) 4 MG tablet Take 1 tablet (4 mg total) by mouth every 4 (four) hours as needed for nausea. 90 tablet 3  . OXYGEN Inhale 4 L into the lungs continuous.    . pantoprazole (PROTONIX) 20 MG tablet Take by mouth.    . Potassium Chloride ER 20 MEQ TBCR Take 1 tablet by mouth 2 (two) times daily.    . potassium chloride SA (KLOR-CON) 20 MEQ tablet Take 20 mEq by mouth 2  (two) times daily.    . prochlorperazine (COMPAZINE) 10 MG tablet Take 1 tablet (10 mg total) by mouth every 6 (six) hours as needed for nausea or vomiting. 90 tablet 3  . sodium chloride 1 g tablet Take by mouth.    . TRELEGY ELLIPTA 100-62.5-25 MCG/INH AEPB Inhale 1 puff into the lungs daily.    . Vitamin D, Ergocalciferol, (DRISDOL) 1.25 MG (50000 UNIT) CAPS capsule Take 50,000 Units by mouth once a week.     No current facility-administered medications for this visit.     ASSESSMENT & PLAN:   Assessment:   1. History of stage IIB non-small cell lung cancer in November 2010.  She remains without evidence of recurrent or metastatic disease.  2. History of stage I colon cancer.  She has not been compliant with follow-up colonoscopy.   3. Benign mass of the right breast at 2:30, which we will continue to monitor.  4. Severe oxygen dependant COPD.  She continues 4 L of oxygen per nasal cannula.  5. 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 Recent CT imaging revealed a good response, still her CEA has remained elevated. CEA has decreased from previous, down to 22.6, but is still elevated.  6. Hyponatremia secondary to SIADH, improved.  7. Persistent normochromic, normocytic anemia.   Repeat iron studies did not reveal persistent iron deficiency.  We will continue to monitor this.  8. Hypokalemia, resolved with resuming KCL 20 meq twice daily.  9. Bilateral pulmonary emboli, diagnosed in December 2021.  She continues apixaban 5 mg BID. She does not have evidence of worsening.  10. Hypomagnesemia, resolved with magnesium sulfate 400 mg daily.  11. Hypothyroidism.  She states she has not had a recent TSH, so we obtained this today and it was elevated at 7.37. We will forward this to her primary care provider.  Plan:    We will plan to see her back in 10 days with a CBC and comprehensive metabolic panel for continued  supportive care.  The patient understands the plans discussed today and is in agreement with them.  She knows to contact our office if she develops concerns prior to her next appointment.        [name], PA-C  [facility] [address] [place] [address] Dept: [phone] Dept Fax: [phone]

## 2020-05-05 NOTE — Telephone Encounter (Signed)
Per 2/11 los next appt sched and given to patient

## 2020-05-08 ENCOUNTER — Telehealth: Payer: Self-pay

## 2020-05-08 DIAGNOSIS — D61810 Antineoplastic chemotherapy induced pancytopenia: Secondary | ICD-10-CM | POA: Diagnosis not present

## 2020-05-08 DIAGNOSIS — N390 Urinary tract infection, site not specified: Secondary | ICD-10-CM | POA: Diagnosis not present

## 2020-05-08 DIAGNOSIS — I509 Heart failure, unspecified: Secondary | ICD-10-CM | POA: Diagnosis not present

## 2020-05-08 DIAGNOSIS — H538 Other visual disturbances: Secondary | ICD-10-CM | POA: Diagnosis not present

## 2020-05-08 DIAGNOSIS — Z7951 Long term (current) use of inhaled steroids: Secondary | ICD-10-CM | POA: Diagnosis not present

## 2020-05-08 DIAGNOSIS — I2694 Multiple subsegmental pulmonary emboli without acute cor pulmonale: Secondary | ICD-10-CM | POA: Diagnosis not present

## 2020-05-08 DIAGNOSIS — E876 Hypokalemia: Secondary | ICD-10-CM | POA: Diagnosis not present

## 2020-05-08 DIAGNOSIS — E039 Hypothyroidism, unspecified: Secondary | ICD-10-CM | POA: Diagnosis not present

## 2020-05-08 DIAGNOSIS — J9611 Chronic respiratory failure with hypoxia: Secondary | ICD-10-CM | POA: Diagnosis not present

## 2020-05-08 DIAGNOSIS — E871 Hypo-osmolality and hyponatremia: Secondary | ICD-10-CM | POA: Diagnosis not present

## 2020-05-08 DIAGNOSIS — I110 Hypertensive heart disease with heart failure: Secondary | ICD-10-CM | POA: Diagnosis not present

## 2020-05-08 DIAGNOSIS — E119 Type 2 diabetes mellitus without complications: Secondary | ICD-10-CM | POA: Diagnosis not present

## 2020-05-08 DIAGNOSIS — I2510 Atherosclerotic heart disease of native coronary artery without angina pectoris: Secondary | ICD-10-CM | POA: Diagnosis not present

## 2020-05-08 DIAGNOSIS — J449 Chronic obstructive pulmonary disease, unspecified: Secondary | ICD-10-CM | POA: Diagnosis not present

## 2020-05-08 DIAGNOSIS — Z87891 Personal history of nicotine dependence: Secondary | ICD-10-CM | POA: Diagnosis not present

## 2020-05-08 DIAGNOSIS — Z85038 Personal history of other malignant neoplasm of large intestine: Secondary | ICD-10-CM | POA: Diagnosis not present

## 2020-05-08 DIAGNOSIS — C3490 Malignant neoplasm of unspecified part of unspecified bronchus or lung: Secondary | ICD-10-CM | POA: Diagnosis not present

## 2020-05-08 DIAGNOSIS — K219 Gastro-esophageal reflux disease without esophagitis: Secondary | ICD-10-CM | POA: Diagnosis not present

## 2020-05-08 DIAGNOSIS — M1990 Unspecified osteoarthritis, unspecified site: Secondary | ICD-10-CM | POA: Diagnosis not present

## 2020-05-08 DIAGNOSIS — Z9181 History of falling: Secondary | ICD-10-CM | POA: Diagnosis not present

## 2020-05-08 LAB — CEA: CEA: 22.6 — AB (ref 0–4.7)

## 2020-05-08 NOTE — Telephone Encounter (Signed)
-----   Message from [name], PA-C sent at 05/08/2020  4:08 PM EST ----- Please let her know TSH was a little elevated and fax to PCP, per patient-Stephanie Hudnell, her charts says [facility] Gula may be in his office. Thanks!

## 2020-05-08 NOTE — Telephone Encounter (Signed)
Called patient and let her know TSH level is elevated, and we faxed her records to [phone].

## 2020-05-08 NOTE — Progress Notes (Signed)
Sent in request for DOS 05/15/2020, to [facility].

## 2020-05-09 DIAGNOSIS — K219 Gastro-esophageal reflux disease without esophagitis: Secondary | ICD-10-CM | POA: Diagnosis not present

## 2020-05-09 DIAGNOSIS — J449 Chronic obstructive pulmonary disease, unspecified: Secondary | ICD-10-CM | POA: Diagnosis not present

## 2020-05-09 DIAGNOSIS — C3490 Malignant neoplasm of unspecified part of unspecified bronchus or lung: Secondary | ICD-10-CM | POA: Diagnosis not present

## 2020-05-09 DIAGNOSIS — J9611 Chronic respiratory failure with hypoxia: Secondary | ICD-10-CM | POA: Diagnosis not present

## 2020-05-09 DIAGNOSIS — Z87891 Personal history of nicotine dependence: Secondary | ICD-10-CM | POA: Diagnosis not present

## 2020-05-09 DIAGNOSIS — D61810 Antineoplastic chemotherapy induced pancytopenia: Secondary | ICD-10-CM | POA: Diagnosis not present

## 2020-05-09 DIAGNOSIS — I509 Heart failure, unspecified: Secondary | ICD-10-CM | POA: Diagnosis not present

## 2020-05-09 DIAGNOSIS — Z9181 History of falling: Secondary | ICD-10-CM | POA: Diagnosis not present

## 2020-05-09 DIAGNOSIS — I110 Hypertensive heart disease with heart failure: Secondary | ICD-10-CM | POA: Diagnosis not present

## 2020-05-09 DIAGNOSIS — E039 Hypothyroidism, unspecified: Secondary | ICD-10-CM | POA: Diagnosis not present

## 2020-05-09 DIAGNOSIS — Z7951 Long term (current) use of inhaled steroids: Secondary | ICD-10-CM | POA: Diagnosis not present

## 2020-05-09 DIAGNOSIS — M1990 Unspecified osteoarthritis, unspecified site: Secondary | ICD-10-CM | POA: Diagnosis not present

## 2020-05-09 DIAGNOSIS — E876 Hypokalemia: Secondary | ICD-10-CM | POA: Diagnosis not present

## 2020-05-09 DIAGNOSIS — I2510 Atherosclerotic heart disease of native coronary artery without angina pectoris: Secondary | ICD-10-CM | POA: Diagnosis not present

## 2020-05-09 DIAGNOSIS — E119 Type 2 diabetes mellitus without complications: Secondary | ICD-10-CM | POA: Diagnosis not present

## 2020-05-09 DIAGNOSIS — H538 Other visual disturbances: Secondary | ICD-10-CM | POA: Diagnosis not present

## 2020-05-09 DIAGNOSIS — N390 Urinary tract infection, site not specified: Secondary | ICD-10-CM | POA: Diagnosis not present

## 2020-05-09 DIAGNOSIS — Z85038 Personal history of other malignant neoplasm of large intestine: Secondary | ICD-10-CM | POA: Diagnosis not present

## 2020-05-09 DIAGNOSIS — E871 Hypo-osmolality and hyponatremia: Secondary | ICD-10-CM | POA: Diagnosis not present

## 2020-05-09 DIAGNOSIS — I2694 Multiple subsegmental pulmonary emboli without acute cor pulmonale: Secondary | ICD-10-CM | POA: Diagnosis not present

## 2020-05-11 NOTE — Progress Notes (Signed)
Gabrielle Hudson  [address] [place],  [place]  [address] [phone]  Clinic Day:  05/15/2020  Referring physician: Helen Hashimoto., MD   This document serves as a record of services personally performed by Hosie Poisson, MD. It was created on their behalf by [facility], a trained medical scribe. The creation of this record is based on the scribe's personal observations and the provider's statements to them.  CHIEF COMPLAINT:  CC:  Small cell carcinoma of the left hilar area   Current Treatment:  Carboplatin/etoposide   HISTORY OF PRESENT ILLNESS:  Gabrielle Hudson is a 80 y.o. female with stage IIB non-small-cell lung cancer of the right upper lobe diagnosed in November 2010.  She had a large endobronchial component causing obstruction, so underwent laser therapy at [facility] with a good response.  Due to severe emphysema and the location of the tumor being within 2 cm of the carina, she was not a surgical candidate.  She was treated with concurrent radiation and chemotherapy with carboplatin and paclitaxel, followed by an additional 2 months of carboplatin and paclitaxel with a good response.  She had bilateral pneumonia in December 2012.  CT scan at that time revealed some abnormality concerning for recurrence, so a PET scan was obtained in January 2013.  There was mild hypermetabolic activity in the right hilum, but the patient opted for observation.  Repeat CT scans had been stable, with scarring of the posterior right upper lobe.  She also has a history of a stage I adenocarcinoma of the colon, arising from a tubulovillous adenoma.  Her colonoscopy in August 2012 had removal of multiple benign polyps.  Her last mammogram was in December 2017.  She had her Port-A-Cath removed in May 2018. She quit smoking about 11 years ago.  CT chest in December 2018 revealed progression of amorphous soft tissue attenuation of the right  hilum.  There was no definite lymphadenopathy.  There were stable scattered tiny pulmonary nodules all less than 5 mm.  There is evidence of emphysema.  There is a stable right liver lesion measuring 2.5 cm which was unchanged for over 5 years.  CT chest in March of 2019 and September of 2019 were stable, with soft tissue attenuation is in the area of previous radiation.  CT chest in October of 2020 revealed all previously noted pulmonary nodules measuring  4 mm or less in size are stable compared to the prior examination, and are considered definitively benign. She also underwent bilateral diagnostic mammogram and right breast ultrasound which revealed the likely benign mass in the right breast at 2:30 is stable. CEA had decreased from 4.6 to 4.4.    She was referred back from [facility] in October 2021 after diagnosis of a new small cell lung cancer.  She presented with hemoptysis and pain of the left lower thorax.  CT revealed tumor encasing and narrowing the left lower lobe pulmonary artery and the left lower lobe bronchus.  There is a mass of the left hilum measuring 5.2 cm with left lower lobe atelectasis.  She also has moderate emphysema and mild cardiomegaly, with evidence of coronary artery disease.  Biopsy on October 19th revealed small cell carcinoma with crush artifact.  She had associated hyponatremia as low as 125, and required demeclocycline.  She was also placed on prednisone, 40 mg daily for 5 days, then 20 mg daily.  She complains of occasional cough productive of creamy  colored sputum and occasional wheezing but no further hemoptysis.  She is using a lidoderm patch for her left sided chest pain with partial relief.  She had been hospitalized several times in August of this year for hyponatremia before her new lung cancer was diagnosed.  In retrospect, the left hilar cancer was not obvious on chest x-ray.  She was found to be iron deficient and her potassium was down to 3.0.   Baseline CEA was 14.1.  She received 3 cycles of carboplatin/etoposide and has problems with hypomagnesemia  Her CEA had increased in November to 48.3 from 14.1 in October, so we were concerned she may not be responding.  After 3 cycles of carboplatin/etoposide, she was hospitalized with severe weakness and diarrhea.  She was found to have severe neutropenia, which worsened during hospitalization to as low as 0.5 with an ANC of 80, despite Neulasta.  Her white blood count finally improved to 1.5 with an Weston of 650.  Her hemoglobin was down to 7. Her platelets steadily decreased daily from 140,000 at admission down to 17,000.  She denied bleeding, but did have severe bruising.  She received platelets and PRBC's during hospitalization.  Her platelets were 50,000 on discharge.  She also had a UTI with Klebsiella Pneumoniae, which was resistant only to ampicillin and was treated with IV ceftrixone.  CT  chest in the emergency department revealed extensive bilateral lower lobe segmental pulmonary emboli (almost occlusive in the left lower lobe), so she was placed on heparin.  She was discharged on January 4th on apixaban 5 mg twice a day.  We reviewed the previous imaging reports and there was good shrinkage of the tumor.  When she was seen on January 6th, her white count was elevated, hemoglobin improved and platelets had normalized.  We wanted to give at least a 4th cycle of chemotherapy with reduction in doses. However, we are concerned that she may not be able to tolerate further chemotherapy.  She received a 4th cycle of carboplatin/etoposide beginning January 31st with a 25% dose reduction and tolerated it well.   Her CEA was fairly stable at 42.5 in January, and down to 22.5 in early February.  INTERVAL HISTORY:  She is here for follow up and continued supportive care.  She completed 4 cycles of chemotherapy with carboplatin/etoposide on February 4th, and we will plan to repeat imaging in March to reassess her  disease baseline.  She reports a minor fall this morning where she slid off the seat of her motorized wheelchair.  She feels this was secondary to an episode of vertigo, and has been using OTC medication without much improvement.  I will prescribe Antivert 25 mg to use 1/2 to 1 pill BID prn.  Her hemoglobin is stable to mildly worse at 9.5, and her white count and platelets are normal.   Chemistries are unremarkable except for a blood glucose of 183.  CEA from February 11th was 22.6, down from 42.5 in January.  Her  appetite is good, and she has lost 5 pounds since her last visit.  She denies fever, chills or other signs of infection.  She denies nausea, vomiting, bowel issues, or abdominal pain.  She denies sore throat, cough or chest pain.  REVIEW OF SYSTEMS:  Review of Systems  Constitutional: Negative.  Negative for appetite change, chills, fatigue, fever and unexpected weight change.  HENT:  Negative.   Eyes: Negative.   Respiratory: Positive for shortness of breath (on supplemental oxygen). Negative for  chest tightness, cough, hemoptysis and wheezing.   Cardiovascular: Negative.  Negative for chest pain, leg swelling and palpitations.  Gastrointestinal: Negative.  Negative for abdominal distention, abdominal pain, blood in stool, constipation, diarrhea, nausea and vomiting.  Endocrine: Negative.   Genitourinary: Negative.  Negative for difficulty urinating, dysuria, frequency and hematuria.   Musculoskeletal: Positive for gait problem (in a motorized wheelchair). Negative for arthralgias, back pain, flank pain and myalgias.  Skin: Negative.   Neurological: Positive for dizziness (due to vertigo), extremity weakness and gait problem (in a motorized wheelchair). Negative for headaches, light-headedness, numbness, seizures and speech difficulty.       One minor fall  Hematological: Negative.   Psychiatric/Behavioral: Negative.  Negative for depression and sleep disturbance. The patient is not  nervous/anxious.      VITALS:  Blood pressure (!) 126/91, pulse (!) 106, temperature 99 F (37.2 C), temperature source Oral, resp. rate 18, height 5\' 3"  (1.6 m), weight 215 lb 9.6 oz (97.8 kg), SpO2 95 %.  Wt Readings from Last 3 Encounters:  05/15/20 215 lb 9.6 oz (97.8 kg)  05/05/20 220 lb 8 oz (100 kg)  04/24/20 220 lb (99.8 kg)    Body mass index is 38.19 kg/m.  Performance status (ECOG): 2 - Symptomatic, <50% confined to bed  PHYSICAL EXAM:  Physical Exam Constitutional:      General: She is not in acute distress.    Appearance: Normal appearance. She is normal weight.  HENT:     Head: Normocephalic and atraumatic.  Eyes:     General: No scleral icterus.    Extraocular Movements: Extraocular movements intact.     Conjunctiva/sclera: Conjunctivae normal.     Pupils: Pupils are equal, round, and reactive to light.  Cardiovascular:     Rate and Rhythm: Regular rhythm. Tachycardia present.     Pulses: Normal pulses.     Heart sounds: Normal heart sounds. No murmur heard. No friction rub. No gallop.   Pulmonary:     Effort: Pulmonary effort is normal. No respiratory distress.     Breath sounds: Normal breath sounds.  Abdominal:     General: Bowel sounds are normal. There is no distension.     Palpations: Abdomen is soft. There is no hepatomegaly, splenomegaly or mass.     Tenderness: There is no abdominal tenderness.  Musculoskeletal:        General: Normal range of motion.     Cervical back: Normal range of motion and neck supple.     Right lower leg: No edema.     Left lower leg: No edema.  Lymphadenopathy:     Cervical: No cervical adenopathy.  Skin:    General: Skin is warm and dry.  Neurological:     General: No focal deficit present.     Mental Status: She is alert and oriented to person, place, and time. Mental status is at baseline.  Psychiatric:        Mood and Affect: Mood normal.        Behavior: Behavior normal.        Thought Content: Thought  content normal.        Judgment: Judgment normal.     LABS:   CBC Latest Ref Rng & Units 05/05/2020 04/19/2020 03/30/2020  WBC - 3.0 5.9 39.4  Hemoglobin 12.0 - 16.0 9.9(A) 9.8(A) 11.2(A)  Hematocrit 36 - 46 29(A) 30(A) 35(A)  Platelets 150 - 399 93(A) 231 153   CMP Latest Ref Rng & Units 05/05/2020 04/19/2020  03/30/2020  Glucose 70 - 99 mg/dL - - -  BUN 4 - 21 14 12 13   Creatinine 0.5 - 1.1 0.9 1.0 0.8  Sodium 137 - 147 134(A) 135(A) 130(A)  Potassium 3.4 - 5.3 3.7 4.1 4.5  Chloride 99 - 108 99 100 93(A)  CO2 13 - 22 26(A) 29(A) 30(A)  Calcium 8.7 - 10.7 8.5(A) 9.3 8.4(A)  Total Protein 6.5 - 8.1 g/dL - - -  Total Bilirubin 0.3 - 1.2 mg/dL - - -  Alkaline Phos 25 - 125 109 122 163(A)  AST 13 - 35 15 22 42(A)  ALT 7 - 35 16 22 32    Lab Results  Component Value Date   CEA1 42.5 (H) 03/31/2020   /  CEA  Date Value Ref Range Status  03/31/2020 42.5 (H) 0.0 - 4.7 ng/mL Final    Comment:    (NOTE)                             Nonsmokers          <3.9                             Smokers             <5.6 [employer] Electrochemiluminescence Immunoassay (ECLIA) Values obtained with different assay methods or kits cannot be used interchangeably.  Results cannot be interpreted as absolute evidence of the presence or absence of malignant disease. Performed At: [facility] [place], [place] [id number] Rush Farmer MD AS:[id number]      Lab Results  Component Value Date   TOTALPROTELP 4.7 (L) 03/26/2011   ALBUMINELP 52.5 (L) 03/26/2011   A1GS 8.7 (H) 03/26/2011   A2GS 18.8 (H) 03/26/2011   BETS 4.5 (L) 03/26/2011   BETA2SER 4.7 03/26/2011   GAMS 10.8 (L) 03/26/2011   MSPIKE NOT DETECTED 03/26/2011   SPEI (NOTE) 03/26/2011   Lab Results  Component Value Date   TIBC 226 04/19/2020   TIBC 219 (L) 03/29/2011   FERRITIN 361 04/19/2020   FERRITIN 356 (H) 03/29/2011   IRONPCTSAT 44.2 04/19/2020   IRONPCTSAT 65 (H) 03/29/2011   Lab Results   Component Value Date   LDH 349 (H) 03/26/2011     STUDIES:    Allergies:  Allergies  Allergen Reactions  . Nitroglycerin In D5w Other (See Comments)    "flatlines"  . Nitroglycerin Other (See Comments)    Drops her BP 'flatlines'     Current Medications: Current Outpatient Medications  Medication Sig Dispense Refill  . albuterol (PROVENTIL) (5 MG/ML) 0.5% nebulizer solution Take 2.5 mg by nebulization daily.    Marland Kitchen apixaban (ELIQUIS) 5 MG TABS tablet Take 1 tablet (5 mg total) by mouth 2 (two) times daily. 60 tablet 1  . budesonide-formoterol (SYMBICORT) 160-4.5 MCG/ACT inhaler Inhale 2 puffs into the lungs 2 (two) times daily.      . Calcium Carbonate-Vitamin D (CALCIUM + D PO) Take 1 tablet by mouth daily.      . calcium citrate-vitamin D (CITRACAL+D) 315-200 MG-UNIT tablet Take 1 tablet by mouth 2 (two) times daily.    . Cholecalciferol (VITAMIN D3) 1.25 MG (50000 UT) CAPS     . Cholecalciferol 25 MCG (1000 UT) tablet Take by mouth.    . cyclobenzaprine (FLEXERIL) 10 MG tablet Take by mouth.    . diltiazem (CARDIZEM CD) 120 MG  24 hr capsule Take 120 mg by mouth daily.    . EUTHYROX 88 MCG tablet Take 88 mcg by mouth daily.    . famotidine (PEPCID) 20 MG tablet Take 20 mg by mouth 2 (two) times daily.    . furosemide (LASIX) 20 MG tablet Take 20 mg by mouth as needed. ONLY TAKES IT HAVING SWELLING    . gabapentin (NEURONTIN) 300 MG capsule Take 300 mg by mouth as needed.    Marland Kitchen guaiFENesin (MUCINEX) 600 MG 12 hr tablet Take 1 tablet by mouth every 12 (twelve) hours.    Marland Kitchen HYDROcodone-acetaminophen (NORCO) 7.5-325 MG tablet Take 1 tablet by mouth every 4 (four) hours as needed for moderate pain. 120 tablet 0  . magnesium oxide (MAG-OX) 400 (241.3 Mg) MG tablet Take 1 tablet (400 mg total) by mouth daily. 30 tablet 5  . ondansetron (ZOFRAN) 4 MG tablet Take 1 tablet (4 mg total) by mouth every 4 (four) hours as needed for nausea. 90 tablet 3  . OXYGEN Inhale 4 L into the lungs  continuous.    . pantoprazole (PROTONIX) 20 MG tablet Take by mouth.    . Potassium Chloride ER 20 MEQ TBCR Take 1 tablet by mouth 2 (two) times daily.    . potassium chloride SA (KLOR-CON) 20 MEQ tablet Take 20 mEq by mouth 2 (two) times daily.    . prochlorperazine (COMPAZINE) 10 MG tablet Take 1 tablet (10 mg total) by mouth every 6 (six) hours as needed for nausea or vomiting. 90 tablet 3  . sodium chloride 1 g tablet Take by mouth.    . TRELEGY ELLIPTA 100-62.5-25 MCG/INH AEPB Inhale 1 puff into the lungs daily.    . Vitamin D, Ergocalciferol, (DRISDOL) 1.25 MG (50000 UNIT) CAPS capsule Take 50,000 Units by mouth once a week.     No current facility-administered medications for this visit.     ASSESSMENT & PLAN:   Assessment:   1. History of stage IIB non-small cell lung cancer in November 2010.  She remains without evidence of recurrent or metastatic disease.  2. History of stage I colon cancer.  She has not been compliant with follow-up colonoscopy.   3. Benign mass of the right breast at 2:30, which we will continue to monitor.  4. Severe oxygen dependant COPD.  She continues 4 L of oxygen per nasal cannula.  5. 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 CT imaging revealed a good response.  CEA has decreased from previous, down to 22.6, but is still elevated.  6. Hyponatremia secondary to SIADH, stable.  7. Persistent normochromic, normocytic anemia.   Repeat iron studies did not reveal persistent iron deficiency.  We will continue to monitor this.  8. Bilateral pulmonary emboli, diagnosed in December 2021.  She continues apixaban 5 mg BID. She does not have evidence of worsening.  We will reevaluate on the next scans.  9. Hypothyroidism.  A recent TSH was elevated at 7.37. This was forwarded this to her primary care provider.  We will repeat this next month.  10.  Vertigo.  I will place her on Antivert 25 mg  to take 1/2 to 1 pill BID prn.   Plan:     As she has been struggling with vertigo despite OTC medication, I will place her on Antivert 25 mg to take 1/2 to 1 pill BID prn.  Otherwise, she is doing well.  We  will plan to see her back in 2 weeks with a CBC, comprehensive metabolic panel, TSH, T4, CEA and CT chest, abdomen and pelvis to reassess her disease baseline.  The patient understands the plans discussed today and is in agreement with them.  She knows to contact our office if she develops concerns prior to her next appointment.   Derwood Kaplan, MD [facility] [address] [place] [address] Dept: [phone] Dept Fax: [phone]   I, Rita Ohara, am acting as scribe for Derwood Kaplan, MD  I have reviewed this report as typed by the medical scribe, and it is complete and accurate.

## 2020-05-15 ENCOUNTER — Inpatient Hospital Stay: Payer: Medicare Other

## 2020-05-15 ENCOUNTER — Telehealth: Payer: Self-pay | Admitting: Oncology

## 2020-05-15 ENCOUNTER — Encounter: Payer: Self-pay | Admitting: Oncology

## 2020-05-15 ENCOUNTER — Other Ambulatory Visit: Payer: Self-pay | Admitting: Oncology

## 2020-05-15 ENCOUNTER — Other Ambulatory Visit: Payer: Self-pay | Admitting: Hematology and Oncology

## 2020-05-15 ENCOUNTER — Inpatient Hospital Stay (INDEPENDENT_AMBULATORY_CARE_PROVIDER_SITE_OTHER): Payer: Medicare Other | Admitting: Oncology

## 2020-05-15 ENCOUNTER — Other Ambulatory Visit: Payer: Self-pay

## 2020-05-15 VITALS — BP 126/91 | HR 106 | Temp 99.0°F | Resp 18 | Ht 63.0 in | Wt 215.6 lb

## 2020-05-15 DIAGNOSIS — Z86711 Personal history of pulmonary embolism: Secondary | ICD-10-CM | POA: Diagnosis not present

## 2020-05-15 DIAGNOSIS — E222 Syndrome of inappropriate secretion of antidiuretic hormone: Secondary | ICD-10-CM | POA: Diagnosis not present

## 2020-05-15 DIAGNOSIS — C7951 Secondary malignant neoplasm of bone: Secondary | ICD-10-CM

## 2020-05-15 DIAGNOSIS — D241 Benign neoplasm of right breast: Secondary | ICD-10-CM | POA: Diagnosis not present

## 2020-05-15 DIAGNOSIS — Z85038 Personal history of other malignant neoplasm of large intestine: Secondary | ICD-10-CM | POA: Diagnosis not present

## 2020-05-15 DIAGNOSIS — D649 Anemia, unspecified: Secondary | ICD-10-CM | POA: Diagnosis not present

## 2020-05-15 DIAGNOSIS — I2699 Other pulmonary embolism without acute cor pulmonale: Secondary | ICD-10-CM | POA: Diagnosis not present

## 2020-05-15 DIAGNOSIS — C3432 Malignant neoplasm of lower lobe, left bronchus or lung: Secondary | ICD-10-CM

## 2020-05-15 DIAGNOSIS — Z7901 Long term (current) use of anticoagulants: Secondary | ICD-10-CM | POA: Diagnosis not present

## 2020-05-15 DIAGNOSIS — E039 Hypothyroidism, unspecified: Secondary | ICD-10-CM | POA: Diagnosis not present

## 2020-05-15 DIAGNOSIS — Z5111 Encounter for antineoplastic chemotherapy: Secondary | ICD-10-CM | POA: Diagnosis not present

## 2020-05-15 DIAGNOSIS — E871 Hypo-osmolality and hyponatremia: Secondary | ICD-10-CM

## 2020-05-15 DIAGNOSIS — J449 Chronic obstructive pulmonary disease, unspecified: Secondary | ICD-10-CM | POA: Diagnosis not present

## 2020-05-15 DIAGNOSIS — Z9981 Dependence on supplemental oxygen: Secondary | ICD-10-CM | POA: Diagnosis not present

## 2020-05-15 DIAGNOSIS — C3411 Malignant neoplasm of upper lobe, right bronchus or lung: Secondary | ICD-10-CM | POA: Diagnosis not present

## 2020-05-15 DIAGNOSIS — C7952 Secondary malignant neoplasm of bone marrow: Secondary | ICD-10-CM

## 2020-05-15 LAB — CBC AND DIFFERENTIAL
HCT: 28 — AB (ref 36–46)
Hemoglobin: 9.5 — AB (ref 12.0–16.0)
Neutrophils Absolute: 4.62
Platelets: 370 (ref 150–399)
WBC: 6.9

## 2020-05-15 LAB — HEPATIC FUNCTION PANEL
ALT: 22 (ref 7–35)
AST: 32 (ref 13–35)
Alkaline Phosphatase: 106 (ref 25–125)
Bilirubin, Total: 0.5

## 2020-05-15 LAB — COMPREHENSIVE METABOLIC PANEL
Albumin: 4.1 (ref 3.5–5.0)
Calcium: 8.5 — AB (ref 8.7–10.7)

## 2020-05-15 LAB — BASIC METABOLIC PANEL
BUN: 14 (ref 4–21)
CO2: 28 — AB (ref 13–22)
Chloride: 97 — AB (ref 99–108)
Creatinine: 0.9 (ref 0.5–1.1)
Glucose: 183
Potassium: 3.9 (ref 3.4–5.3)
Sodium: 134 — AB (ref 137–147)

## 2020-05-15 LAB — CBC: RBC: 2.97 — AB (ref 3.87–5.11)

## 2020-05-15 LAB — MAGNESIUM: Magnesium: 1.8

## 2020-05-15 NOTE — Telephone Encounter (Signed)
Per 2/21 LOS, patient scheduled for Labs, CT Scans on 3/7 - Follow Up on 3/9.  Gave patient Orders/Instructions - Appt Summary

## 2020-05-15 NOTE — Progress Notes (Signed)
Sent in request for DOS 05/29/20 and 05/31/20, to [facility].

## 2020-05-16 ENCOUNTER — Other Ambulatory Visit: Payer: Self-pay | Admitting: Oncology

## 2020-05-16 ENCOUNTER — Telehealth: Payer: Self-pay

## 2020-05-16 ENCOUNTER — Encounter: Payer: Self-pay | Admitting: Oncology

## 2020-05-16 DIAGNOSIS — M1990 Unspecified osteoarthritis, unspecified site: Secondary | ICD-10-CM | POA: Diagnosis not present

## 2020-05-16 DIAGNOSIS — I509 Heart failure, unspecified: Secondary | ICD-10-CM | POA: Diagnosis not present

## 2020-05-16 DIAGNOSIS — E119 Type 2 diabetes mellitus without complications: Secondary | ICD-10-CM | POA: Diagnosis not present

## 2020-05-16 DIAGNOSIS — C3432 Malignant neoplasm of lower lobe, left bronchus or lung: Secondary | ICD-10-CM

## 2020-05-16 DIAGNOSIS — Z85038 Personal history of other malignant neoplasm of large intestine: Secondary | ICD-10-CM | POA: Diagnosis not present

## 2020-05-16 DIAGNOSIS — E039 Hypothyroidism, unspecified: Secondary | ICD-10-CM | POA: Diagnosis not present

## 2020-05-16 DIAGNOSIS — J449 Chronic obstructive pulmonary disease, unspecified: Secondary | ICD-10-CM | POA: Diagnosis not present

## 2020-05-16 DIAGNOSIS — K219 Gastro-esophageal reflux disease without esophagitis: Secondary | ICD-10-CM | POA: Diagnosis not present

## 2020-05-16 DIAGNOSIS — I2694 Multiple subsegmental pulmonary emboli without acute cor pulmonale: Secondary | ICD-10-CM | POA: Diagnosis not present

## 2020-05-16 DIAGNOSIS — E876 Hypokalemia: Secondary | ICD-10-CM | POA: Diagnosis not present

## 2020-05-16 DIAGNOSIS — E871 Hypo-osmolality and hyponatremia: Secondary | ICD-10-CM | POA: Diagnosis not present

## 2020-05-16 DIAGNOSIS — J9611 Chronic respiratory failure with hypoxia: Secondary | ICD-10-CM | POA: Diagnosis not present

## 2020-05-16 DIAGNOSIS — N390 Urinary tract infection, site not specified: Secondary | ICD-10-CM | POA: Diagnosis not present

## 2020-05-16 DIAGNOSIS — D61810 Antineoplastic chemotherapy induced pancytopenia: Secondary | ICD-10-CM | POA: Diagnosis not present

## 2020-05-16 DIAGNOSIS — Z9181 History of falling: Secondary | ICD-10-CM | POA: Diagnosis not present

## 2020-05-16 DIAGNOSIS — I2510 Atherosclerotic heart disease of native coronary artery without angina pectoris: Secondary | ICD-10-CM | POA: Diagnosis not present

## 2020-05-16 DIAGNOSIS — Z87891 Personal history of nicotine dependence: Secondary | ICD-10-CM | POA: Diagnosis not present

## 2020-05-16 DIAGNOSIS — C3490 Malignant neoplasm of unspecified part of unspecified bronchus or lung: Secondary | ICD-10-CM | POA: Diagnosis not present

## 2020-05-16 DIAGNOSIS — Z7951 Long term (current) use of inhaled steroids: Secondary | ICD-10-CM | POA: Diagnosis not present

## 2020-05-16 DIAGNOSIS — H538 Other visual disturbances: Secondary | ICD-10-CM | POA: Diagnosis not present

## 2020-05-16 DIAGNOSIS — I110 Hypertensive heart disease with heart failure: Secondary | ICD-10-CM | POA: Diagnosis not present

## 2020-05-16 LAB — CEA: CEA: 18.3 ng/mL — ABNORMAL HIGH (ref 0.0–4.7)

## 2020-05-16 NOTE — Telephone Encounter (Signed)
Received a call from [facility], with [facility]. She went out to see pt today for nurse visit. Pt told her she fell yesterday without injury. Pt did have to call the fire department to come and pick her up. Just FYI.

## 2020-05-17 ENCOUNTER — Telehealth: Payer: Self-pay

## 2020-05-17 ENCOUNTER — Other Ambulatory Visit: Payer: Self-pay | Admitting: Hematology and Oncology

## 2020-05-17 MED ORDER — MECLIZINE HCL 25 MG PO TABS: 12.5000 mg | ORAL_TABLET | Freq: Three times a day (TID) | ORAL | 0 refills | Status: DC | PRN

## 2020-05-17 NOTE — Telephone Encounter (Signed)
Spoke with patient and let her know that Lenna Sciara was calling in the prescription now for her.

## 2020-05-18 NOTE — Telephone Encounter (Addendum)
I called to make sure pt knew that script was sent in yesterday afternoon. Pt states yes, she had picked it up.   ----- Message from Derwood Kaplan, MD sent at 05/17/2020  4:16 PM EST ----- Regarding: RE: Medication for vertigo Contact: [place], would you send in Antivert 25 mg to take 1/2 -1 tid prn vertigo, #30 ----- Message ----- From: Dairl Ponder, RN Sent: 05/17/2020   3:32 PM EST To: Derwood Kaplan, MD Subject: Medication for vertigo                         Pt called and said you were going to send her in something for vertigo. She called the pharmacy and they haven't received anything. [facility]

## 2020-05-19 DIAGNOSIS — Z0000 Encounter for general adult medical examination without abnormal findings: Secondary | ICD-10-CM | POA: Diagnosis not present

## 2020-05-19 DIAGNOSIS — Z9181 History of falling: Secondary | ICD-10-CM | POA: Diagnosis not present

## 2020-05-19 DIAGNOSIS — E785 Hyperlipidemia, unspecified: Secondary | ICD-10-CM | POA: Diagnosis not present

## 2020-05-20 DIAGNOSIS — J45998 Other asthma: Secondary | ICD-10-CM | POA: Diagnosis not present

## 2020-05-22 ENCOUNTER — Other Ambulatory Visit: Payer: Self-pay | Admitting: Oncology

## 2020-05-22 DIAGNOSIS — E038 Other specified hypothyroidism: Secondary | ICD-10-CM

## 2020-05-25 DIAGNOSIS — I110 Hypertensive heart disease with heart failure: Secondary | ICD-10-CM | POA: Diagnosis not present

## 2020-05-25 DIAGNOSIS — J9611 Chronic respiratory failure with hypoxia: Secondary | ICD-10-CM | POA: Diagnosis not present

## 2020-05-25 DIAGNOSIS — E119 Type 2 diabetes mellitus without complications: Secondary | ICD-10-CM | POA: Diagnosis not present

## 2020-05-25 DIAGNOSIS — Z87891 Personal history of nicotine dependence: Secondary | ICD-10-CM | POA: Diagnosis not present

## 2020-05-25 DIAGNOSIS — H538 Other visual disturbances: Secondary | ICD-10-CM | POA: Diagnosis not present

## 2020-05-25 DIAGNOSIS — E039 Hypothyroidism, unspecified: Secondary | ICD-10-CM | POA: Diagnosis not present

## 2020-05-25 DIAGNOSIS — D61810 Antineoplastic chemotherapy induced pancytopenia: Secondary | ICD-10-CM | POA: Diagnosis not present

## 2020-05-25 DIAGNOSIS — I2694 Multiple subsegmental pulmonary emboli without acute cor pulmonale: Secondary | ICD-10-CM | POA: Diagnosis not present

## 2020-05-25 DIAGNOSIS — Z85038 Personal history of other malignant neoplasm of large intestine: Secondary | ICD-10-CM | POA: Diagnosis not present

## 2020-05-25 DIAGNOSIS — Z7951 Long term (current) use of inhaled steroids: Secondary | ICD-10-CM | POA: Diagnosis not present

## 2020-05-25 DIAGNOSIS — C3490 Malignant neoplasm of unspecified part of unspecified bronchus or lung: Secondary | ICD-10-CM | POA: Diagnosis not present

## 2020-05-25 DIAGNOSIS — E871 Hypo-osmolality and hyponatremia: Secondary | ICD-10-CM | POA: Diagnosis not present

## 2020-05-25 DIAGNOSIS — I509 Heart failure, unspecified: Secondary | ICD-10-CM | POA: Diagnosis not present

## 2020-05-25 DIAGNOSIS — I2510 Atherosclerotic heart disease of native coronary artery without angina pectoris: Secondary | ICD-10-CM | POA: Diagnosis not present

## 2020-05-25 DIAGNOSIS — E876 Hypokalemia: Secondary | ICD-10-CM | POA: Diagnosis not present

## 2020-05-25 DIAGNOSIS — Z9181 History of falling: Secondary | ICD-10-CM | POA: Diagnosis not present

## 2020-05-25 DIAGNOSIS — N390 Urinary tract infection, site not specified: Secondary | ICD-10-CM | POA: Diagnosis not present

## 2020-05-25 DIAGNOSIS — K219 Gastro-esophageal reflux disease without esophagitis: Secondary | ICD-10-CM | POA: Diagnosis not present

## 2020-05-25 DIAGNOSIS — M1990 Unspecified osteoarthritis, unspecified site: Secondary | ICD-10-CM | POA: Diagnosis not present

## 2020-05-25 DIAGNOSIS — J449 Chronic obstructive pulmonary disease, unspecified: Secondary | ICD-10-CM | POA: Diagnosis not present

## 2020-05-26 ENCOUNTER — Encounter: Payer: Self-pay | Admitting: Oncology

## 2020-05-29 ENCOUNTER — Encounter: Payer: Self-pay | Admitting: Oncology

## 2020-05-29 DIAGNOSIS — Z85118 Personal history of other malignant neoplasm of bronchus and lung: Secondary | ICD-10-CM | POA: Diagnosis not present

## 2020-05-29 DIAGNOSIS — Z85850 Personal history of malignant neoplasm of thyroid: Secondary | ICD-10-CM | POA: Diagnosis not present

## 2020-05-29 DIAGNOSIS — C3490 Malignant neoplasm of unspecified part of unspecified bronchus or lung: Secondary | ICD-10-CM | POA: Diagnosis not present

## 2020-05-29 DIAGNOSIS — C189 Malignant neoplasm of colon, unspecified: Secondary | ICD-10-CM | POA: Diagnosis not present

## 2020-05-29 DIAGNOSIS — C187 Malignant neoplasm of sigmoid colon: Secondary | ICD-10-CM | POA: Diagnosis not present

## 2020-05-29 DIAGNOSIS — C3481 Malignant neoplasm of overlapping sites of right bronchus and lung: Secondary | ICD-10-CM | POA: Diagnosis not present

## 2020-05-29 DIAGNOSIS — M8789 Other osteonecrosis, multiple sites: Secondary | ICD-10-CM | POA: Diagnosis not present

## 2020-05-29 DIAGNOSIS — J449 Chronic obstructive pulmonary disease, unspecified: Secondary | ICD-10-CM | POA: Diagnosis not present

## 2020-05-29 DIAGNOSIS — R946 Abnormal results of thyroid function studies: Secondary | ICD-10-CM | POA: Diagnosis not present

## 2020-05-29 DIAGNOSIS — I2510 Atherosclerotic heart disease of native coronary artery without angina pectoris: Secondary | ICD-10-CM | POA: Diagnosis not present

## 2020-05-29 DIAGNOSIS — D649 Anemia, unspecified: Secondary | ICD-10-CM | POA: Diagnosis not present

## 2020-05-29 DIAGNOSIS — E890 Postprocedural hypothyroidism: Secondary | ICD-10-CM | POA: Diagnosis not present

## 2020-05-30 NOTE — Progress Notes (Signed)
[facility]  [address] [place],  [place]  [address] [phone]  Clinic Day:  05/31/2020  Referring physician: Helen Hashimoto., MD   This document serves as a record of services personally performed by Hosie Poisson, MD. It was created on their behalf by [facility], a trained medical scribe. The creation of this record is based on the scribe's personal observations and the provider's statements to them.  CHIEF COMPLAINT:  CC:  Small cell carcinoma of the left hilar area   Current Treatment:  Will plan to switch her to immunotherapy and start denosumab   HISTORY OF PRESENT ILLNESS:  Gabrielle Hudson is a 80 y.o. female with stage IIB non-small-cell lung cancer of the right upper lobe diagnosed in November 2010.  She had a large endobronchial component causing obstruction, so underwent laser therapy at [facility] with a good response.  Due to severe emphysema and the location of the tumor being within 2 cm of the carina, she was not a surgical candidate.  She was treated with concurrent radiation and chemotherapy with carboplatin and paclitaxel, followed by an additional 2 months of carboplatin and paclitaxel with a good response.  She had bilateral pneumonia in December 2012.  CT scan at that time revealed some abnormality concerning for recurrence, so a PET scan was obtained in January 2013.  There was mild hypermetabolic activity in the right hilum, but the patient opted for observation.  Repeat CT scans had been stable, with scarring of the posterior right upper lobe.  She also has a history of a stage I adenocarcinoma of the colon, arising from a tubulovillous adenoma.  Her colonoscopy in August 2012 had removal of multiple benign polyps.  Her last mammogram was in December 2017.  She had her Port-A-Cath removed in May 2018. She quit smoking about 11 years ago.  CT chest in December 2018 revealed progression of amorphous  soft tissue attenuation of the right hilum.  There was no definite lymphadenopathy.  There were stable scattered tiny pulmonary nodules all less than 5 mm.  There is evidence of emphysema.  There is a stable right liver lesion measuring 2.5 cm which was unchanged for over 5 years.  CT chest in March of 2019 and September of 2019 were stable, with soft tissue attenuation is in the area of previous radiation.  CT chest in October of 2020 revealed all previously noted pulmonary nodules measuring  4 mm or less in size are stable compared to the prior examination, and are considered definitively benign. She also underwent bilateral diagnostic mammogram and right breast ultrasound which revealed the likely benign mass in the right breast at 2:30 is stable. CEA had decreased from 4.6 to 4.4.    She was referred back from [facility] in October 2021 after diagnosis of a new small cell lung cancer.  She presented with hemoptysis and pain of the left lower thorax.  CT revealed tumor encasing and narrowing the left lower lobe pulmonary artery and the left lower lobe bronchus.  There is a mass of the left hilum measuring 5.2 cm with left lower lobe atelectasis.  She also has moderate emphysema and mild cardiomegaly, with evidence of coronary artery disease.  Biopsy on October 19th revealed small cell carcinoma with crush artifact.  She had associated hyponatremia as low as 125, and required demeclocycline.  She was also placed on prednisone, 40 mg daily for 5 days, then 20 mg daily.  She complains of occasional cough productive of creamy colored sputum and occasional wheezing but no further hemoptysis.  She is using a lidoderm patch for her left sided chest pain with partial relief.  She had been hospitalized several times in August of this year for hyponatremia before her new lung cancer was diagnosed.  In retrospect, the left hilar cancer was not obvious on chest x-ray.  She was found to be iron deficient  and her potassium was down to 3.0.  Baseline CEA was 14.1.  She received 3 cycles of carboplatin/etoposide and has problems with hypomagnesemia  Her CEA had increased in November to 48.3 from 14.1 in October, so we were concerned she may not be responding.  After 3 cycles of carboplatin/etoposide, she was hospitalized with severe weakness and diarrhea.  She was found to have severe neutropenia, which worsened during hospitalization to as low as 0.5 with an ANC of 80, despite Neulasta.  Her white blood count finally improved to 1.5 with an [place] of 650.  Her hemoglobin was down to 7. Her platelets steadily decreased daily from 140,000 at admission down to 17,000.  She denied bleeding, but did have severe bruising.  She received platelets and PRBC's during hospitalization.  Her platelets were 50,000 on discharge.  She also had a UTI with Klebsiella Pneumoniae, which was resistant only to ampicillin and was treated with IV ceftrixone.  CT  chest in the emergency department revealed extensive bilateral lower lobe segmental pulmonary emboli (almost occlusive in the left lower lobe), so she was placed on heparin.  She was discharged on January 4th on apixaban 5 mg twice a day.  We reviewed the previous imaging reports and there was good shrinkage of the tumor.  When she was seen on January 6th, her white count was elevated, hemoglobin improved and platelets had normalized.  We wanted to give at least a 4th cycle of chemotherapy with reduction in doses. However, we are concerned that she may not be able to tolerate further chemotherapy.  She received a 4th cycle of carboplatin/etoposide beginning January 31st with a 25% dose reduction and tolerated it well.   Her CEA was fairly stable at 42.5 in January, and down to 22.5 in early February.   She completed 4 cycles of chemotherapy with carboplatin/etoposide on February 4th.  INTERVAL HISTORY:  She is here for follow up and continues supportive care.  CT chest, abdomen and  pelvis from March 7th revealed new sclerotic lesions in the L2 and L4 vertebral bodies, left sacrum and left ischium, as well as mild changes of avascular necrosis in both femoral heads.  Otherwise, the exam remains stable and her lung malignancy is under control.  Her hemoglobin has increased from 9.5 to 10.7, and her white count and platelets are normal.  Chemistries are unremarkable except for mildly elevated liver transaminases.  CEA was 19.5, previously 18.3 in February.  TSH was 5.32.  She denies pain of the pelvis.  She continues 4 L of oxygen per nasal cannula.  She has occasional cough.  Her  appetite is good, and she has gained 4 and 1/2 pounds since her last visit.  She denies fever, chills or other signs of infection.  She denies nausea, vomiting, bowel issues, or abdominal pain.  She denies sore throat or chest pain.  REVIEW OF SYSTEMS:  Review of Systems  Constitutional: Negative.  Negative for appetite change, chills, fatigue, fever and unexpected weight change.  HENT:  Negative.   Eyes: Negative.  Respiratory: Positive for cough (occasional) and shortness of breath (on supplemental oxygen). Negative for chest tightness, hemoptysis and wheezing.   Cardiovascular: Negative.  Negative for chest pain, leg swelling and palpitations.  Gastrointestinal: Negative.  Negative for abdominal distention, abdominal pain, blood in stool, constipation, diarrhea, nausea and vomiting.  Endocrine: Negative.   Genitourinary: Negative.  Negative for difficulty urinating, dysuria, frequency and hematuria.   Musculoskeletal: Negative for arthralgias, back pain, flank pain, gait problem and myalgias.  Skin: Negative.   Neurological: Positive for extremity weakness (in an automated wheelchair). Negative for dizziness, gait problem, headaches, light-headedness, numbness, seizures and speech difficulty.  Hematological: Negative.   Psychiatric/Behavioral: Negative.  Negative for depression and sleep  disturbance. The patient is not nervous/anxious.      VITALS:  Blood pressure (!) 173/79, pulse 90, temperature 98.8 F (37.1 C), temperature source Oral, resp. rate 20, height 5\' 3"  (1.6 m), weight 221 lb (100.2 kg), SpO2 96 %.  Wt Readings from Last 3 Encounters:  05/31/20 221 lb (100.2 kg)  05/15/20 215 lb 9.6 oz (97.8 kg)  05/05/20 220 lb 8 oz (100 kg)    Body mass index is 39.15 kg/m.  Performance status (ECOG): 2 - Symptomatic, <50% confined to bed  PHYSICAL EXAM:  Physical Exam Constitutional:      General: She is not in acute distress.    Appearance: Normal appearance. She is normal weight.  HENT:     Head: Normocephalic and atraumatic.  Eyes:     General: No scleral icterus.    Extraocular Movements: Extraocular movements intact.     Conjunctiva/sclera: Conjunctivae normal.     Pupils: Pupils are equal, round, and reactive to light.  Cardiovascular:     Rate and Rhythm: Normal rate and regular rhythm.     Pulses: Normal pulses.     Heart sounds: Normal heart sounds. No murmur heard. No friction rub. No gallop.   Pulmonary:     Effort: Pulmonary effort is normal. No respiratory distress.     Breath sounds: Normal breath sounds.  Abdominal:     General: Bowel sounds are normal. There is no distension.     Palpations: Abdomen is soft. There is no hepatomegaly, splenomegaly or mass.     Tenderness: There is no abdominal tenderness.  Musculoskeletal:        General: Normal range of motion.     Cervical back: Normal range of motion and neck supple.     Right lower leg: No edema.     Left lower leg: No edema.  Lymphadenopathy:     Cervical: No cervical adenopathy.  Skin:    General: Skin is warm and dry.  Neurological:     General: No focal deficit present.     Mental Status: She is alert and oriented to person, place, and time. Mental status is at baseline.  Psychiatric:        Mood and Affect: Mood normal.        Behavior: Behavior normal.        Thought  Content: Thought content normal.        Judgment: Judgment normal.     LABS:   CBC Latest Ref Rng & Units 05/15/2020 05/05/2020 04/19/2020  WBC - 6.9 3.0 5.9  Hemoglobin 12.0 - 16.0 9.5(A) 9.9(A) 9.8(A)  Hematocrit 36 - 46 28(A) 29(A) 30(A)  Platelets 150 - 399 370 93(A) 231   CMP Latest Ref Rng & Units 05/15/2020 05/05/2020 04/19/2020  Glucose 70 - 99 mg/dL - - -  BUN 4 - 21 14 14 12   Creatinine 0.5 - 1.1 0.9 0.9 1.0  Sodium 137 - 147 134(A) 134(A) 135(A)  Potassium 3.4 - 5.3 3.9 3.7 4.1  Chloride 99 - 108 97(A) 99 100  CO2 13 - 22 28(A) 26(A) 29(A)  Calcium 8.7 - 10.7 8.5(A) 8.5(A) 9.3  Total Protein 6.5 - 8.1 g/dL - - -  Total Bilirubin 0.3 - 1.2 mg/dL - - -  Alkaline Phos 25 - 125 106 109 122  AST 13 - 35 32 15 22  ALT 7 - 35 22 16 22     Lab Results  Component Value Date   CEA1 18.3 (H) 05/15/2020   /  CEA  Date Value Ref Range Status  05/15/2020 18.3 (H) 0.0 - 4.7 ng/mL Final    Comment:    (NOTE)                             Nonsmokers          <3.9                             Smokers             <5.6 [employer] Electrochemiluminescence Immunoassay (ECLIA) Values obtained with different assay methods or kits cannot be used interchangeably.  Results cannot be interpreted as absolute evidence of the presence or absence of malignant disease. Performed At: [facility] [place], [place] [id number] Rush Farmer MD MB:[id number]      Lab Results  Component Value Date   TOTALPROTELP 4.7 (L) 03/26/2011   ALBUMINELP 52.5 (L) 03/26/2011   A1GS 8.7 (H) 03/26/2011   A2GS 18.8 (H) 03/26/2011   BETS 4.5 (L) 03/26/2011   BETA2SER 4.7 03/26/2011   GAMS 10.8 (L) 03/26/2011   MSPIKE NOT DETECTED 03/26/2011   SPEI (NOTE) 03/26/2011   Lab Results  Component Value Date   TIBC 226 04/19/2020   TIBC 219 (L) 03/29/2011   FERRITIN 361 04/19/2020   FERRITIN 356 (H) 03/29/2011   IRONPCTSAT 44.2 04/19/2020   IRONPCTSAT 65 (H) 03/29/2011   Lab  Results  Component Value Date   LDH 349 (H) 03/26/2011     STUDIES:   EXAM: 05/29/2020 CT CHEST, ABDOMEN, AND PELVIS WITH CONTRAST  TECHNIQUE: Multidetector CT imaging of the chest, abdomen and pelvis was performed following the standard protocol during bolus administration of intravenous contrast.  CONTRAST:  100 cc Isovue 370.  COMPARISON:  CT chest 03/20/2020 and 01/08/2020. CT abdomen pelvis 01/27/2009.  FINDINGS: CT CHEST FINDINGS  Cardiovascular: Right IJ Port-A-Cath terminates in the SVC. Atherosclerotic calcification of the aorta and coronary arteries. Lipomatous hypertrophy of the interatrial septum. Heart is at the upper limits of normal in size to mildly enlarged. No pericardial effusion.  Mediastinum/Nodes: Thyroidectomy. No pathologically enlarged mediastinal, hilar or axillary lymph nodes. Esophagus is unremarkable.  Lungs/Pleura: Centrilobular emphysema. Post radiation parenchymal consolidation, bronchiectasis and retraction in the medial upper right hemithorax. Nodular densities in the right upper lobe measure up to 5 mm (4/23), unchanged. Scattered pulmonary parenchymal scarring in the left hemithorax. 4 mm medial left lower lobe nodule (4/68), unchanged. No pleural fluid. Airway is unremarkable.  Musculoskeletal: Degenerative changes in the spine. There are new sclerotic lesions in the T6, T9 and T11 vertebral bodies.  CT ABDOMEN PELVIS FINDINGS  Hepatobiliary: Faint hypodense lesion in the right hepatic lobe measures 2.4 x  2.7 cm and appears to have centripetal filling in of contrast on delayed imaging, favoring a hemangioma, stable from 03/20/2020. A similar lesion seen anteriorly within the right hepatic lobe on the prior study is faintly visualized (2/59). Cholecystectomy. No biliary ductal dilatation.  Pancreas: Negative.  Spleen: Negative.  Adrenals/Urinary Tract: Adrenal glands and right kidney are unremarkable. 1.3 cm low-attenuation  lesion in upper pole left kidney is too small to characterize. Kidneys are otherwise unremarkable. Ureters are decompressed. Bladder is unremarkable.  Stomach/Bowel: Small hiatal hernia. Stomach, small bowel, appendix and colon are unremarkable.  Vascular/Lymphatic: Atherosclerotic calcification of the aorta. No pathologically enlarged lymph nodes.  Reproductive: Uterus is visualized. 2.0 cm low-attenuation lesion associated with the left ovary, unchanged.  Other: No free fluid. Mesenteries and peritoneum are otherwise unremarkable.  Musculoskeletal: New sclerotic lesions in the L2 and L4 vertebral bodies, left sacrum and left ischium. Mild changes of avascular necrosis in both femoral heads.  IMPRESSION: 1. New osseous metastatic disease. 2. Post radiation changes in the right hemithorax, stable. 3. Aortic atherosclerosis (ICD10-I70.0). Coronary artery calcification. 4.  Emphysema (ICD10-J43.9).   Allergies:  Allergies  Allergen Reactions  . Nitroglycerin In D5w Other (See Comments)    "flatlines"  . Nitroglycerin Other (See Comments)    Drops her BP 'flatlines'     Current Medications: Current Outpatient Medications  Medication Sig Dispense Refill  . albuterol (PROVENTIL) (5 MG/ML) 0.5% nebulizer solution Take 2.5 mg by nebulization daily.    Marland Kitchen apixaban (ELIQUIS) 5 MG TABS tablet Take 1 tablet (5 mg total) by mouth 2 (two) times daily. 60 tablet 1  . budesonide-formoterol (SYMBICORT) 160-4.5 MCG/ACT inhaler Inhale 2 puffs into the lungs 2 (two) times daily.      . calcium citrate-vitamin D (CITRACAL+D) 315-200 MG-UNIT tablet Take 1 tablet by mouth 2 (two) times daily.    . Cholecalciferol (VITAMIN D3) 1.25 MG (50000 UT) CAPS     . Cholecalciferol 25 MCG (1000 UT) tablet Take by mouth.    . cyclobenzaprine (FLEXERIL) 10 MG tablet Take by mouth.    . diltiazem (CARDIZEM CD) 120 MG 24 hr capsule Take 120 mg by mouth daily.    . EUTHYROX 88 MCG tablet Take 88 mcg by  mouth daily.    . famotidine (PEPCID) 20 MG tablet Take 20 mg by mouth 2 (two) times daily.    . fluticasone (FLONASE) 50 MCG/ACT nasal spray Place into both nostrils.    . furosemide (LASIX) 20 MG tablet Take 20 mg by mouth as needed. ONLY TAKES IT HAVING SWELLING    . gabapentin (NEURONTIN) 300 MG capsule Take 300 mg by mouth as needed.    Marland Kitchen guaiFENesin (MUCINEX) 600 MG 12 hr tablet Take 1 tablet by mouth every 12 (twelve) hours.    Marland Kitchen HYDROcodone-acetaminophen (NORCO) 7.5-325 MG tablet Take 1 tablet by mouth every 4 (four) hours as needed for moderate pain. 120 tablet 0  . magnesium oxide (MAG-OX) 400 (241.3 Mg) MG tablet Take 1 tablet (400 mg total) by mouth daily. 30 tablet 5  . meclizine (ANTIVERT) 25 MG tablet Take 0.5-1 tablets (12.5-25 mg total) by mouth 3 (three) times daily as needed for dizziness. 30 tablet 0  . ondansetron (ZOFRAN) 4 MG tablet Take 1 tablet (4 mg total) by mouth every 4 (four) hours as needed for nausea. 90 tablet 3  . OXYGEN Inhale 4 L into the lungs continuous.    . pantoprazole (PROTONIX) 20 MG tablet Take by mouth.    Marland Kitchen  Potassium Chloride ER 20 MEQ TBCR Take 1 tablet by mouth 2 (two) times daily.    . prochlorperazine (COMPAZINE) 10 MG tablet Take 1 tablet (10 mg total) by mouth every 6 (six) hours as needed for nausea or vomiting. 90 tablet 3  . sodium chloride 1 g tablet Take by mouth.    . Vitamin D, Ergocalciferol, (DRISDOL) 1.25 MG (50000 UNIT) CAPS capsule Take 50,000 Units by mouth once a week.     No current facility-administered medications for this visit.     ASSESSMENT & PLAN:   Assessment:   1. History of stage IIB non-small cell lung cancer in November 2010.  She remains without evidence of recurrent or metastatic disease.  2. History of stage I colon cancer.   3. Severe oxygen dependant COPD.  She continues 4 L of oxygen per nasal cannula.  4. Small cell carcinoma of the left hilar area, which is at least a stage IIIB, diagnosed in October  2021. CT imaging initially revealed a good response.  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  We will plan to switch her to immunotherapy with nivolumab, and will start her on denosumab.  Most recent CEA was 19.5  5.  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  We will plan to add in denosumab.  6.  Hyponatremia secondary to SIADH, resolved.  7. Persistent normochromic, normocytic anemia, improved.   Repeat iron studies did not reveal persistent iron deficiency.  We will continue to monitor this.  8. Bilateral pulmonary emboli, diagnosed in December 2021.  She continues apixaban 5 mg BID.  We discussed that she could stop anticoagulation now, but would have high risk for recurrent thrombosis due to her malignancy, and sedentary lifestyle.  9. Hypothyroidism.  A recent TSH was elevated at 5.32. This was forwarded this to her primary care provider.  We will repeat this next month.  Plan:     CT chest, abdomen and pelvis from March 7th revealed new sclerotic lesions in the L2 and L4 vertebral bodies, left sacrum and left ischium, as well as mild changes of avascular necrosis in both femoral heads.  Otherwise, the exam remains stable.  As the sclerotic lesions occurred while on her current treatment, we will now plan to switch her to immunotherapy with nivolumab every 2 weeks or pembrolizumab every 3 weeks.  She would also benefit from the addition of denosumab.  The schedule and potential toxicities were reviewed, and we will schedule her for an education session with Melissa on Friday along with lab work.  She is in agreement, and so we will try and get this started within the next couple of weeks.  The patient understands the plans discussed today and is in  agreement with them.  She knows to contact our office if she develops concerns prior to her next appointment.   Derwood Kaplan, MD [facility] [address] [place] [address] Dept: [phone] Dept Fax: [phone]   I, Rita Ohara, am acting as scribe for Derwood Kaplan, MD  I have reviewed this report as typed by the medical scribe, and it is complete and accurate.

## 2020-05-31 ENCOUNTER — Telehealth: Payer: Self-pay | Admitting: Oncology

## 2020-05-31 ENCOUNTER — Other Ambulatory Visit: Payer: Self-pay | Admitting: Oncology

## 2020-05-31 ENCOUNTER — Other Ambulatory Visit: Payer: Self-pay

## 2020-05-31 ENCOUNTER — Encounter: Payer: Self-pay | Admitting: Oncology

## 2020-05-31 ENCOUNTER — Inpatient Hospital Stay: Payer: Medicare Other | Attending: Oncology | Admitting: Oncology

## 2020-05-31 VITALS — BP 173/79 | HR 90 | Temp 98.8°F | Resp 20 | Ht 63.0 in | Wt 221.0 lb

## 2020-05-31 DIAGNOSIS — Z5112 Encounter for antineoplastic immunotherapy: Secondary | ICD-10-CM | POA: Insufficient documentation

## 2020-05-31 DIAGNOSIS — D649 Anemia, unspecified: Secondary | ICD-10-CM | POA: Insufficient documentation

## 2020-05-31 DIAGNOSIS — Z7901 Long term (current) use of anticoagulants: Secondary | ICD-10-CM | POA: Insufficient documentation

## 2020-05-31 DIAGNOSIS — C3432 Malignant neoplasm of lower lobe, left bronchus or lung: Secondary | ICD-10-CM | POA: Diagnosis not present

## 2020-05-31 DIAGNOSIS — Z9981 Dependence on supplemental oxygen: Secondary | ICD-10-CM | POA: Insufficient documentation

## 2020-05-31 DIAGNOSIS — I2699 Other pulmonary embolism without acute cor pulmonale: Secondary | ICD-10-CM | POA: Insufficient documentation

## 2020-05-31 DIAGNOSIS — E039 Hypothyroidism, unspecified: Secondary | ICD-10-CM | POA: Insufficient documentation

## 2020-05-31 DIAGNOSIS — D241 Benign neoplasm of right breast: Secondary | ICD-10-CM | POA: Insufficient documentation

## 2020-05-31 DIAGNOSIS — C3411 Malignant neoplasm of upper lobe, right bronchus or lung: Secondary | ICD-10-CM | POA: Insufficient documentation

## 2020-05-31 DIAGNOSIS — Z86711 Personal history of pulmonary embolism: Secondary | ICD-10-CM | POA: Insufficient documentation

## 2020-05-31 DIAGNOSIS — J449 Chronic obstructive pulmonary disease, unspecified: Secondary | ICD-10-CM | POA: Insufficient documentation

## 2020-05-31 DIAGNOSIS — E222 Syndrome of inappropriate secretion of antidiuretic hormone: Secondary | ICD-10-CM | POA: Insufficient documentation

## 2020-05-31 DIAGNOSIS — Z85038 Personal history of other malignant neoplasm of large intestine: Secondary | ICD-10-CM | POA: Insufficient documentation

## 2020-05-31 NOTE — Progress Notes (Signed)
Sent in request for DOS 06/08/2020, to [employer]. Patient will not need transportation for her appointment on 06/02/2020.

## 2020-05-31 NOTE — Telephone Encounter (Signed)
Per 3/9 LOS, patient scheduled for 3/11 Labs, Patient Education at 3:00 pm.  Gave patient Appt Summary

## 2020-06-01 ENCOUNTER — Other Ambulatory Visit: Payer: Self-pay | Admitting: Oncology

## 2020-06-01 DIAGNOSIS — C7951 Secondary malignant neoplasm of bone: Secondary | ICD-10-CM | POA: Insufficient documentation

## 2020-06-01 NOTE — Progress Notes (Signed)
Patient on plan of care prior to pathways. 

## 2020-06-01 NOTE — Addendum Note (Signed)
Addended by: Juanetta Beets on: 06/01/2020 03:03 PM   Modules accepted: Orders

## 2020-06-01 NOTE — Progress Notes (Deleted)
The patient is a 80 year old female with lung cancer and new bone lesions.  Patient presents to clinic today with her daughter for chemotherapy education and palliative care consult.  We will start nivolumab and zolendronic acid.  We will send in prescriptions for prochlorperazine and ondansetron.  The patient verbalizes understanding of and agreement to the plan as discussed today.  Provided general information including the following: 1.  Date of education: 06-02-2020 2.  Physician name: Dr. Hinton Rao 3.  Diagnosis: Lung cancer 4.  Stage: Stage IIIB 5.  Palliative 6.  Chemotherapy plan including drugs and how often: Nivolumab and zolendronic acid 7.  Start date: Pending authorization 8.  Other referrals: No other referrals at this time 9.  The patient is to call our office with any questions or concerns.  Our office number [phone], if after hours or on the weekend, call the same number and wait for the answering service.  There is always an oncologist on call 10.  Medications prescribed: ondansetron, prochlorperazine 11.  The patient has verbalized understanding of the treatment plan and has no barriers to adherence or understanding.  Obtained signed consent from patient.  Discussed symptoms including 1.  Low blood counts including red blood cells, white blood cells and platelets. 2. Infection including to avoid large crowds, wash hands frequently, and stay away from people who were sick.  If fever develops of 100.4 or higher, call our office. 3.  Mucositis-given instructions on mouth rinse (baking soda and salt mixture).  Keep mouth clean.  Use soft bristle toothbrush.  If mouth sores develop, call our clinic. 4.  Nausea/vomiting-gave prescriptions for ondansetron 4 mg every 4 hours as needed for nausea, may take around the clock if persistent.  Compazine 10 mg every 6 hours, may take around the clock if persistent. 5.  Diarrhea-use over-the-counter Imodium.  Call clinic if not  controlled. 6.  Constipation-use senna, 1 to 2 tablets twice a day.  If no BM in 2 to 3 days call the clinic. 7.  Loss of appetite-try to eat small meals every 2-3 hours.  Call clinic if not eating. 8.  Taste changes-zinc 500 mg daily.  If becomes severe call clinic. 9.  Alcoholic beverages. 10.  Drink 2 to 3 quarts of water per day. 11.  Peripheral neuropathy-patient to call if numbness or tingling in hands or feet is persistent  Neulasta-will be given 24 to 48 hours after chemotherapy.  Gave information sheet on bone and joint pain.  Use Claritin or Pepcid.  May use ibuprofen or Aleve.  Call if symptoms persist or are unbearable.  Gave information on the supportive care team and how to contact them regarding services.  Discussed advanced directives.  The patient does not have their advanced directives but will look at the copy provided in their notebook and will call with any questions. [employer] Social worker Advanced directives  Answered questions to patient satisfaction.  Patient is to call with any further questions or concerns.  Time spent on this palliative care/chemotherapy education was 60 minutes with more than 50% spent discussing diagnosis, prognosis and symptom management.

## 2020-06-02 ENCOUNTER — Other Ambulatory Visit: Payer: Medicare Other

## 2020-06-02 ENCOUNTER — Other Ambulatory Visit: Payer: Medicare Other | Admitting: Hematology and Oncology

## 2020-06-02 ENCOUNTER — Telehealth: Payer: Self-pay | Admitting: Hematology and Oncology

## 2020-06-02 ENCOUNTER — Telehealth: Payer: Self-pay | Admitting: Oncology

## 2020-06-02 NOTE — Telephone Encounter (Signed)
06/02/20 Left msg to reschedule patients appt.

## 2020-06-02 NOTE — Telephone Encounter (Signed)
06/02/20 Spoke with patients daughter and reschedule appt.Daughter could only come on Wed.06/07/20

## 2020-06-06 NOTE — Progress Notes (Addendum)
The patient is a with small cell lung cancer.  Patient presents to clinic today with her daughter for chemotherapy education and palliative care consult.  We will start nivolumab and zoledronic acid on 06-08-2020.  We will send in prescriptions for prochlorperazine and ondansetron.  The patient verbalizes understanding of and agreement to the plan as discussed today.  Provided general information including the following: 1.  Date of education: 06-07-2020 2.  Physician name: Dr. Hinton Rao 3.  Diagnosis: Small cell lung cancer 4.  Stage: stage IIIB 5.  Palliative 6.  Chemotherapy plan including drugs and how often: nivolumab, zoledronic acid 7.  Start date: 06-08-2020 8.  Other referrals: No other referrals at this time 9.  The patient is to call our office with any questions or concerns.  Our office number [phone], if after hours or on the weekend, call the same number and wait for the answering service.  There is always an oncologist on call 10.  Medications prescribed: 11.  The patient has verbalized understanding of the treatment plan and has no barriers to adherence or understanding.  Obtained signed consent from patient.  Discussed symptoms including 1.  Low blood counts including red blood cells, white blood cells and platelets. 2. Infection including to avoid large crowds, wash hands frequently, and stay away from people who were sick.  If fever develops of 100.4 or higher, call our office. 3.  Mucositis-given instructions on mouth rinse (baking soda and salt mixture).  Keep mouth clean.  Use soft bristle toothbrush.  If mouth sores develop, call our clinic. 4.  Nausea/vomiting-gave prescriptions for ondansetron 4 mg every 4 hours as needed for nausea, may take around the clock if persistent.  Compazine 10 mg every 6 hours, may take around the clock if persistent. 5.  Diarrhea-use over-the-counter Imodium.  Call clinic if not controlled. 6.  Constipation-use senna, 1 to 2 tablets twice  a day.  If no BM in 2 to 3 days call the clinic. 7.  Loss of appetite-try to eat small meals every 2-3 hours.  Call clinic if not eating. 8.  Taste changes-zinc 500 mg daily.  If becomes severe call clinic. 9.  Alcoholic beverages. 10.  Drink 2 to 3 quarts of water per day. 11.  Peripheral neuropathy-patient to call if numbness or tingling in hands or feet is persistent  Gave information on the supportive care team and how to contact them regarding services.  Discussed advanced directives.  The patient does not have their advanced directives but will look at the copy provided in their notebook and will call with any questions. [employer] Social worker Advanced directives  Answered questions to patient satisfaction.  Patient is to call with any further questions or concerns.    Gabrielle Scrape, FNP- [facility]

## 2020-06-07 ENCOUNTER — Inpatient Hospital Stay (INDEPENDENT_AMBULATORY_CARE_PROVIDER_SITE_OTHER): Payer: Medicare Other | Admitting: Hematology and Oncology

## 2020-06-07 ENCOUNTER — Telehealth: Payer: Self-pay | Admitting: Hematology and Oncology

## 2020-06-07 ENCOUNTER — Other Ambulatory Visit: Payer: Self-pay

## 2020-06-07 ENCOUNTER — Inpatient Hospital Stay: Payer: Medicare Other

## 2020-06-07 DIAGNOSIS — D649 Anemia, unspecified: Secondary | ICD-10-CM | POA: Diagnosis not present

## 2020-06-07 DIAGNOSIS — E871 Hypo-osmolality and hyponatremia: Secondary | ICD-10-CM

## 2020-06-07 DIAGNOSIS — E222 Syndrome of inappropriate secretion of antidiuretic hormone: Secondary | ICD-10-CM | POA: Diagnosis not present

## 2020-06-07 DIAGNOSIS — I2699 Other pulmonary embolism without acute cor pulmonale: Secondary | ICD-10-CM | POA: Diagnosis not present

## 2020-06-07 DIAGNOSIS — J449 Chronic obstructive pulmonary disease, unspecified: Secondary | ICD-10-CM | POA: Diagnosis not present

## 2020-06-07 DIAGNOSIS — D241 Benign neoplasm of right breast: Secondary | ICD-10-CM | POA: Diagnosis not present

## 2020-06-07 DIAGNOSIS — C3432 Malignant neoplasm of lower lobe, left bronchus or lung: Secondary | ICD-10-CM

## 2020-06-07 DIAGNOSIS — E039 Hypothyroidism, unspecified: Secondary | ICD-10-CM | POA: Diagnosis not present

## 2020-06-07 DIAGNOSIS — Z5112 Encounter for antineoplastic immunotherapy: Secondary | ICD-10-CM | POA: Diagnosis not present

## 2020-06-07 DIAGNOSIS — Z9981 Dependence on supplemental oxygen: Secondary | ICD-10-CM | POA: Diagnosis not present

## 2020-06-07 DIAGNOSIS — C3430 Malignant neoplasm of lower lobe, unspecified bronchus or lung: Secondary | ICD-10-CM

## 2020-06-07 DIAGNOSIS — C3411 Malignant neoplasm of upper lobe, right bronchus or lung: Secondary | ICD-10-CM | POA: Diagnosis not present

## 2020-06-07 DIAGNOSIS — Z7901 Long term (current) use of anticoagulants: Secondary | ICD-10-CM | POA: Diagnosis not present

## 2020-06-07 DIAGNOSIS — Z86711 Personal history of pulmonary embolism: Secondary | ICD-10-CM | POA: Diagnosis not present

## 2020-06-07 DIAGNOSIS — Z85038 Personal history of other malignant neoplasm of large intestine: Secondary | ICD-10-CM | POA: Diagnosis not present

## 2020-06-07 LAB — HEPATIC FUNCTION PANEL
ALT: 16 (ref 7–35)
AST: 21 (ref 13–35)
Alkaline Phosphatase: 108 (ref 25–125)
Bilirubin, Total: 0.4

## 2020-06-07 LAB — CBC AND DIFFERENTIAL
HCT: 35 — AB (ref 36–46)
Hemoglobin: 11.7 — AB (ref 12.0–16.0)
Neutrophils Absolute: 4.88
Platelets: 226 (ref 150–399)
WBC: 7.4

## 2020-06-07 LAB — BASIC METABOLIC PANEL
BUN: 14 (ref 4–21)
CO2: 27 — AB (ref 13–22)
Chloride: 100 (ref 99–108)
Creatinine: 0.9 (ref 0.5–1.1)
Glucose: 129
Potassium: 3.9 (ref 3.4–5.3)
Sodium: 137 (ref 137–147)

## 2020-06-07 LAB — COMPREHENSIVE METABOLIC PANEL
Albumin: 4.4 (ref 3.5–5.0)
Calcium: 9.3 (ref 8.7–10.7)

## 2020-06-07 LAB — CBC: RBC: 3.66 — AB (ref 3.87–5.11)

## 2020-06-07 LAB — TSH: TSH: 7.768 u[IU]/mL — ABNORMAL HIGH (ref 0.350–4.500)

## 2020-06-07 NOTE — Telephone Encounter (Signed)
Per 3/16 los next appt scheduled and given to patient

## 2020-06-07 NOTE — Addendum Note (Signed)
Addended by: [place] Scrape on: 06/07/2020 02:32 PM   Modules accepted: Level of Service

## 2020-06-08 ENCOUNTER — Inpatient Hospital Stay: Payer: Medicare Other

## 2020-06-08 VITALS — BP 141/63 | HR 80 | Temp 97.3°F | Resp 20 | Ht 63.0 in | Wt 216.5 lb

## 2020-06-08 DIAGNOSIS — Z9981 Dependence on supplemental oxygen: Secondary | ICD-10-CM | POA: Diagnosis not present

## 2020-06-08 DIAGNOSIS — E222 Syndrome of inappropriate secretion of antidiuretic hormone: Secondary | ICD-10-CM | POA: Diagnosis not present

## 2020-06-08 DIAGNOSIS — I2699 Other pulmonary embolism without acute cor pulmonale: Secondary | ICD-10-CM | POA: Diagnosis not present

## 2020-06-08 DIAGNOSIS — C7951 Secondary malignant neoplasm of bone: Secondary | ICD-10-CM

## 2020-06-08 DIAGNOSIS — Z7901 Long term (current) use of anticoagulants: Secondary | ICD-10-CM | POA: Diagnosis not present

## 2020-06-08 DIAGNOSIS — C3432 Malignant neoplasm of lower lobe, left bronchus or lung: Secondary | ICD-10-CM

## 2020-06-08 DIAGNOSIS — E039 Hypothyroidism, unspecified: Secondary | ICD-10-CM | POA: Diagnosis not present

## 2020-06-08 DIAGNOSIS — C7952 Secondary malignant neoplasm of bone marrow: Secondary | ICD-10-CM

## 2020-06-08 DIAGNOSIS — Z85038 Personal history of other malignant neoplasm of large intestine: Secondary | ICD-10-CM | POA: Diagnosis not present

## 2020-06-08 DIAGNOSIS — D649 Anemia, unspecified: Secondary | ICD-10-CM | POA: Diagnosis not present

## 2020-06-08 DIAGNOSIS — D241 Benign neoplasm of right breast: Secondary | ICD-10-CM | POA: Diagnosis not present

## 2020-06-08 DIAGNOSIS — J449 Chronic obstructive pulmonary disease, unspecified: Secondary | ICD-10-CM | POA: Diagnosis not present

## 2020-06-08 DIAGNOSIS — Z5112 Encounter for antineoplastic immunotherapy: Secondary | ICD-10-CM | POA: Diagnosis not present

## 2020-06-08 DIAGNOSIS — E871 Hypo-osmolality and hyponatremia: Secondary | ICD-10-CM

## 2020-06-08 DIAGNOSIS — Z86711 Personal history of pulmonary embolism: Secondary | ICD-10-CM | POA: Diagnosis not present

## 2020-06-08 DIAGNOSIS — C3411 Malignant neoplasm of upper lobe, right bronchus or lung: Secondary | ICD-10-CM | POA: Diagnosis not present

## 2020-06-08 MED ORDER — HEPARIN SOD (PORK) LOCK FLUSH 100 UNIT/ML IV SOLN
500.0000 [IU] | Freq: Once | INTRAVENOUS | Status: AC | PRN
  Administered 2020-06-08: 500 [IU]
  Filled 2020-06-08: qty 5

## 2020-06-08 MED ORDER — ZOLEDRONIC ACID 4 MG/100ML IV SOLN
4.0000 mg | Freq: Once | INTRAVENOUS | Status: AC
  Administered 2020-06-08: 4 mg via INTRAVENOUS

## 2020-06-08 MED ORDER — ZOLEDRONIC ACID 4 MG/100ML IV SOLN
INTRAVENOUS | Status: AC
  Filled 2020-06-08: qty 100

## 2020-06-08 MED ORDER — SODIUM CHLORIDE 0.9 % IV SOLN
Freq: Once | INTRAVENOUS | Status: AC
  Filled 2020-06-08: qty 250

## 2020-06-08 MED ORDER — SODIUM CHLORIDE 0.9 % IV SOLN
240.0000 mg | Freq: Once | INTRAVENOUS | Status: AC
  Administered 2020-06-08: 240 mg via INTRAVENOUS
  Filled 2020-06-08: qty 24

## 2020-06-08 NOTE — Patient Instructions (Signed)
Zoledronic Acid Injection (Hypercalcemia, Oncology) What is this medicine? ZOLEDRONIC ACID (ZOE le dron ik AS id) slows calcium loss from bones. It high calcium levels in the blood from some kinds of cancer. It may be used in other people at risk for bone loss. This medicine may be used for other purposes; ask your health care provider or pharmacist if you have questions. COMMON BRAND NAME(S): Zometa What should I tell my health care provider before I take this medicine? They need to know if you have any of these conditions:  cancer  dehydration  dental disease  kidney disease  liver disease  low levels of calcium in the blood  lung or breathing disease (asthma)  receiving steroids like dexamethasone or prednisone  an unusual or allergic reaction to zoledronic acid, other medicines, foods, dyes, or preservatives  pregnant or trying to get pregnant  breast-feeding How should I use this medicine? This drug is injected into a vein. It is given by a health care provider in a hospital or clinic setting. Talk to your health care provider about the use of this drug in children. Special care may be needed. Overdosage: If you think you have taken too much of this medicine contact a poison control center or emergency room at once. NOTE: This medicine is only for you. Do not share this medicine with others. What if I miss a dose? Keep appointments for follow-up doses. It is important not to miss your dose. Call your health care provider if you are unable to keep an appointment. What may interact with this medicine?  certain antibiotics given by injection  NSAIDs, medicines for pain and inflammation, like ibuprofen or naproxen  some diuretics like bumetanide, furosemide  teriparatide  thalidomide This list may not describe all possible interactions. Give your health care provider a list of all the medicines, herbs, non-prescription drugs, or dietary supplements you use. Also tell  them if you smoke, drink alcohol, or use illegal drugs. Some items may interact with your medicine. What should I watch for while using this medicine? Visit your health care provider for regular checks on your progress. It may be some time before you see the benefit from this drug. Some people who take this drug have severe bone, joint, or muscle pain. This drug may also increase your risk for jaw problems or a broken thigh bone. Tell your health care provider right away if you have severe pain in your jaw, bones, joints, or muscles. Tell you health care provider if you have any pain that does not go away or that gets worse. Tell your dentist and dental surgeon that you are taking this drug. You should not have major dental surgery while on this drug. See your dentist to have a dental exam and fix any dental problems before starting this drug. Take good care of your teeth while on this drug. Make sure you see your dentist for regular follow-up appointments. You should make sure you get enough calcium and vitamin D while you are taking this drug. Discuss the foods you eat and the vitamins you take with your health care provider. Check with your health care provider if you have severe diarrhea, nausea, and vomiting, or if you sweat a lot. The loss of too much body fluid may make it dangerous for you to take this drug. You may need blood work done while you are taking this drug. Do not become pregnant while taking this drug. Women should inform their health care provider   if they wish to become pregnant or think they might be pregnant. There is potential for serious harm to an unborn child. Talk to your health care provider for more information. What side effects may I notice from receiving this medicine? Side effects that you should report to your doctor or health care provider as soon as possible:  allergic reactions (skin rash, itching or hives; swelling of the face, lips, or tongue)  bone  pain  infection (fever, chills, cough, sore throat, pain or trouble passing urine)  jaw pain, especially after dental work  joint pain  kidney injury (trouble passing urine or change in the amount of urine)  low blood pressure (dizziness; feeling faint or lightheaded, falls; unusually weak or tired)  low calcium levels (fast heartbeat; muscle cramps or pain; pain, tingling, or numbness in the hands or feet; seizures)  low magnesium levels (fast, irregular heartbeat; muscle cramp or pain; muscle weakness; tremors; seizures)  low red blood cell counts (trouble breathing; feeling faint; lightheaded, falls; unusually weak or tired)  muscle pain  redness, blistering, peeling, or loosening of the skin, including inside the mouth  severe diarrhea  swelling of the ankles, feet, hands  trouble breathing Side effects that usually do not require medical attention (report to your doctor or health care provider if they continue or are bothersome):  anxious  constipation  coughing  depressed mood  eye irritation, itching, or pain  fever  general ill feeling or flu-like symptoms  nausea  pain, redness, or irritation at site where injected  trouble sleeping This list may not describe all possible side effects. Call your doctor for medical advice about side effects. You may report side effects to FDA at 1-800-FDA-1088. Where should I keep my medicine? This drug is given in a hospital or clinic. It will not be stored at home. NOTE: This sheet is a summary. It may not cover all possible information. If you have questions about this medicine, talk to your doctor, pharmacist, or health care provider.  2021 Elsevier/Gold Standard (2018-12-24 09:13:00) Nivolumab injection What is this medicine? NIVOLUMAB (nye VOL ue mab) is a monoclonal antibody. It treats certain types of cancer. Some of the cancers treated are colon cancer, head and neck cancer, Hodgkin lymphoma, lung cancer, and  melanoma. This medicine may be used for other purposes; ask your health care provider or pharmacist if you have questions. COMMON BRAND NAME(S): Opdivo What should I tell my health care provider before I take this medicine? They need to know if you have any of these conditions:  autoimmune diseases like Crohn's disease, ulcerative colitis, or lupus  have had or planning to have an allogeneic stem cell transplant (uses someone else's stem cells)  history of chest radiation  history of organ transplant  nervous system problems like myasthenia gravis or Guillain-Barre syndrome  an unusual or allergic reaction to nivolumab, other medicines, foods, dyes, or preservatives  pregnant or trying to get pregnant  breast-feeding How should I use this medicine? This medicine is for infusion into a vein. It is given by a health care professional in a hospital or clinic setting. A special MedGuide will be given to you before each treatment. Be sure to read this information carefully each time. Talk to your pediatrician regarding the use of this medicine in children. While this drug may be prescribed for children as young as 12 years for selected conditions, precautions do apply. Overdosage: If you think you have taken too much of this medicine contact   a poison control center or emergency room at once. NOTE: This medicine is only for you. Do not share this medicine with others. What if I miss a dose? It is important not to miss your dose. Call your doctor or health care professional if you are unable to keep an appointment. What may interact with this medicine? Interactions have not been studied. This list may not describe all possible interactions. Give your health care provider a list of all the medicines, herbs, non-prescription drugs, or dietary supplements you use. Also tell them if you smoke, drink alcohol, or use illegal drugs. Some items may interact with your medicine. What should I watch  for while using this medicine? This drug may make you feel generally unwell. Continue your course of treatment even though you feel ill unless your doctor tells you to stop. You may need blood work done while you are taking this medicine. Do not become pregnant while taking this medicine or for 5 months after stopping it. Women should inform their doctor if they wish to become pregnant or think they might be pregnant. There is a potential for serious side effects to an unborn child. Talk to your health care professional or pharmacist for more information. Do not breast-feed an infant while taking this medicine or for 5 months after stopping it. What side effects may I notice from receiving this medicine? Side effects that you should report to your doctor or health care professional as soon as possible:  allergic reactions like skin rash, itching or hives, swelling of the face, lips, or tongue  breathing problems  blood in the urine  bloody or watery diarrhea or black, tarry stools  changes in emotions or moods  changes in vision  chest pain  cough  dizziness  feeling faint or lightheaded, falls  fever, chills  headache with fever, neck stiffness, confusion, loss of memory, sensitivity to light, hallucination, loss of contact with reality, or seizures  joint pain  mouth sores  redness, blistering, peeling or loosening of the skin, including inside the mouth  severe muscle pain or weakness  signs and symptoms of high blood sugar such as dizziness; dry mouth; dry skin; fruity breath; nausea; stomach pain; increased hunger or thirst; increased urination  signs and symptoms of kidney injury like trouble passing urine or change in the amount of urine  signs and symptoms of liver injury like dark yellow or brown urine; general ill feeling or flu-like symptoms; light-colored stools; loss of appetite; nausea; right upper belly pain; unusually weak or tired; yellowing of the eyes or  skin  swelling of the ankles, feet, hands  trouble passing urine or change in the amount of urine  unusually weak or tired  weight gain or loss Side effects that usually do not require medical attention (report to your doctor or health care professional if they continue or are bothersome):  bone pain  constipation  decreased appetite  diarrhea  muscle pain  nausea, vomiting  tiredness This list may not describe all possible side effects. Call your doctor for medical advice about side effects. You may report side effects to FDA at 1-800-FDA-1088. Where should I keep my medicine? This drug is given in a hospital or clinic and will not be stored at home. NOTE: This sheet is a summary. It may not cover all possible information. If you have questions about this medicine, talk to your doctor, pharmacist, or health care provider.  2021 Elsevier/Gold Standard (2019-07-14 10:08:25)  

## 2020-06-09 ENCOUNTER — Telehealth: Payer: Self-pay

## 2020-06-09 NOTE — Telephone Encounter (Signed)
Pt returned my call. I asked how she did overnight and this morning since taking new drug, Opdivo, yesterday. She states, "I'm good, just sleepy". Pt denies N/V, diarrhea, SOB, fever, and skin rash. I reminded her to call us with temp of 100.4 or higher, DAY OR NIGHT. Also, I told her that she shouldn't take any tylenol or motrin until speaking with the physician. Pt verbalized understanding. Confirmed next appt.

## 2020-06-14 ENCOUNTER — Inpatient Hospital Stay (INDEPENDENT_AMBULATORY_CARE_PROVIDER_SITE_OTHER): Payer: Medicare Other | Admitting: Hematology and Oncology

## 2020-06-14 ENCOUNTER — Telehealth: Payer: Self-pay | Admitting: Oncology

## 2020-06-14 ENCOUNTER — Other Ambulatory Visit: Payer: Self-pay | Admitting: Pharmacist

## 2020-06-14 ENCOUNTER — Other Ambulatory Visit: Payer: Self-pay | Admitting: Hematology and Oncology

## 2020-06-14 ENCOUNTER — Other Ambulatory Visit: Payer: Self-pay

## 2020-06-14 ENCOUNTER — Inpatient Hospital Stay: Payer: Medicare Other

## 2020-06-14 VITALS — BP 142/66 | HR 85 | Temp 98.9°F | Resp 18 | Ht 63.0 in | Wt 223.4 lb

## 2020-06-14 DIAGNOSIS — D649 Anemia, unspecified: Secondary | ICD-10-CM | POA: Diagnosis not present

## 2020-06-14 DIAGNOSIS — Z85038 Personal history of other malignant neoplasm of large intestine: Secondary | ICD-10-CM | POA: Diagnosis not present

## 2020-06-14 DIAGNOSIS — C3432 Malignant neoplasm of lower lobe, left bronchus or lung: Secondary | ICD-10-CM

## 2020-06-14 DIAGNOSIS — E871 Hypo-osmolality and hyponatremia: Secondary | ICD-10-CM

## 2020-06-14 DIAGNOSIS — C3411 Malignant neoplasm of upper lobe, right bronchus or lung: Secondary | ICD-10-CM | POA: Diagnosis not present

## 2020-06-14 DIAGNOSIS — Z86711 Personal history of pulmonary embolism: Secondary | ICD-10-CM | POA: Diagnosis not present

## 2020-06-14 DIAGNOSIS — E222 Syndrome of inappropriate secretion of antidiuretic hormone: Secondary | ICD-10-CM

## 2020-06-14 DIAGNOSIS — Z7901 Long term (current) use of anticoagulants: Secondary | ICD-10-CM | POA: Diagnosis not present

## 2020-06-14 DIAGNOSIS — Z5112 Encounter for antineoplastic immunotherapy: Secondary | ICD-10-CM | POA: Diagnosis not present

## 2020-06-14 DIAGNOSIS — J449 Chronic obstructive pulmonary disease, unspecified: Secondary | ICD-10-CM | POA: Diagnosis not present

## 2020-06-14 DIAGNOSIS — D241 Benign neoplasm of right breast: Secondary | ICD-10-CM | POA: Diagnosis not present

## 2020-06-14 DIAGNOSIS — I2699 Other pulmonary embolism without acute cor pulmonale: Secondary | ICD-10-CM | POA: Diagnosis not present

## 2020-06-14 DIAGNOSIS — E039 Hypothyroidism, unspecified: Secondary | ICD-10-CM | POA: Diagnosis not present

## 2020-06-14 DIAGNOSIS — Z9981 Dependence on supplemental oxygen: Secondary | ICD-10-CM | POA: Diagnosis not present

## 2020-06-14 DIAGNOSIS — C3430 Malignant neoplasm of lower lobe, unspecified bronchus or lung: Secondary | ICD-10-CM

## 2020-06-14 LAB — CBC
MCV: 95 (ref 81–99)
RBC: 3.32 — AB (ref 3.87–5.11)

## 2020-06-14 LAB — CBC AND DIFFERENTIAL
HCT: 32 — AB (ref 36–46)
Hemoglobin: 10.7 — AB (ref 12.0–16.0)
Neutrophils Absolute: 3.21
Platelets: 224 (ref 150–399)
WBC: 5.1

## 2020-06-14 LAB — MAGNESIUM: Magnesium: 1.9 (ref 1.6–2.3)

## 2020-06-14 LAB — COMPREHENSIVE METABOLIC PANEL
Albumin: 3.9 (ref 3.5–5.0)
Calcium: 6.8 — AB (ref 8.7–10.7)

## 2020-06-14 LAB — HEPATIC FUNCTION PANEL
ALT: 14 (ref 7–35)
AST: 19 (ref 13–35)
Alkaline Phosphatase: 111 (ref 25–125)
Bilirubin, Total: 0.5

## 2020-06-14 LAB — BASIC METABOLIC PANEL
BUN: 13 (ref 4–21)
CO2: 27 — AB (ref 13–22)
Chloride: 104 (ref 99–108)
Creatinine: 0.9 (ref 0.5–1.1)
Glucose: 117
Potassium: 4.1 (ref 3.4–5.3)
Sodium: 139 (ref 137–147)

## 2020-06-14 LAB — TSH: TSH: 4.534 u[IU]/mL — ABNORMAL HIGH (ref 0.350–4.500)

## 2020-06-14 NOTE — Telephone Encounter (Signed)
Patient scheduled for April Appt's - Gave patient Appt Summary/Calendars

## 2020-06-14 NOTE — Progress Notes (Signed)
Sent in request for DOS 06/15/2020 to [employer].

## 2020-06-14 NOTE — Progress Notes (Signed)
Cassie from lab called critical Ca+ 6.8 and corrected Ca+ of 6.9.  Almira Bar, NP aware Documentation Open   06/14/2020 [facility] [place], [place]       Additional Documentation  Encounter Info:  Billing Info,   History,   Allergies,   Detailed Report      All Notes    Communication Routing History  None  No questionnaires available.              Orders Placed   None   Medication Changes     None    Medication List   Visit Diagnoses     None    Problem List   Level of Service    Encounter Status  Open

## 2020-06-14 NOTE — Progress Notes (Signed)
Gabrielle Hudson  [address] [place],  [place]  [address] [phone]  Clinic Day:  06/14/2020  Referring physician: Helen Hashimoto., MD  .  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 female with stage IIB non-small-cell lung cancer of the right upper lobe diagnosed in November 2010.  She had a large endobronchial component causing obstruction, so underwent laser therapy at [facility] with a good response.  Due to severe emphysema and the location of the tumor being within 2 cm of the carina, she was not a surgical candidate.  She was treated with concurrent radiation and chemotherapy with carboplatin and paclitaxel, followed by an additional 2 months of carboplatin and paclitaxel with a good response.  She had bilateral pneumonia in December 2012.  CT scan at that time revealed some abnormality concerning for recurrence, so a PET scan was obtained in January 2013.  There was mild hypermetabolic activity in the right hilum, but the patient opted for observation.  Repeat CT scans had been stable, with scarring of the posterior right upper lobe.  She also has a history of a stage I adenocarcinoma of the colon, arising from a tubulovillous adenoma.  Her colonoscopy in August 2012 had removal of multiple benign polyps.  Her last mammogram was in December 2017.  She had her Port-A-Cath removed in May 2018. She quit smoking about 11 years ago.  CT chest in December 2018 revealed progression of amorphous soft tissue attenuation of the right hilum.  There was no definite lymphadenopathy.  There were stable scattered tiny pulmonary nodules all less than 5 mm.  There is evidence of emphysema.  There is a stable right liver  lesion measuring 2.5 cm which was unchanged for over 5 years.  CT chest in March of 2019 and September of 2019 were stable, with soft tissue attenuation is in the area of previous radiation.  CT chest in October of 2020 revealed all previously noted pulmonary nodules measuring  4 mm or less in size are stable compared to the prior examination, and are considered definitively benign. She also underwent bilateral diagnostic mammogram and right breast ultrasound which revealed the likely benign mass in the right breast at 2:30 is stable. CEA had decreased from 4.6 to 4.4.    She was referred back from [facility] in October 2021 after diagnosis of a new small cell lung cancer.  She presented with hemoptysis and pain of the left lower thorax.  CT revealed tumor encasing and narrowing the left lower lobe pulmonary artery and the left lower lobe bronchus.  There is a mass of the left hilum measuring 5.2 cm with left lower lobe atelectasis.  She also has moderate emphysema and mild cardiomegaly, with evidence of coronary artery disease.  Biopsy on October 19th revealed small cell carcinoma with crush artifact.  She had associated hyponatremia as low as 125, and required demeclocycline.  She was also placed on prednisone, 40 mg daily for 5 days, then 20 mg daily.  She complains of occasional cough productive of creamy colored sputum and occasional wheezing but no further hemoptysis.  She is using a lidoderm patch for her left sided chest pain with partial relief.  She  had been hospitalized several times in August of this year for hyponatremia before her new lung cancer was diagnosed.  In retrospect, the left hilar cancer was not obvious on chest x-ray.  She was found to be iron deficient and her potassium was down to 3.0.  Baseline CEA was 14.1.  She received 3 cycles of carboplatin/etoposide and has problems with hypomagnesemia  Her CEA had increased in November to 48.3 from 14.1 in October, so we were  concerned she may not be responding.  After 3 cycles of carboplatin/etoposide, she was hospitalized with severe weakness and diarrhea.  She was found to have severe neutropenia, which worsened during hospitalization to as low as 0.5 with an ANC of 80, despite Neulasta.  Her white blood count finally improved to 1.5 with an Krum of 650.  Her hemoglobin was down to 7. Her platelets steadily decreased daily from 140,000 at admission down to 17,000.  She denied bleeding, but did have severe bruising.  She received platelets and PRBC's during hospitalization.  Her platelets were 50,000 on discharge.  She also had a UTI with Klebsiella Pneumoniae, which was resistant only to ampicillin and was treated with IV ceftrixone.  CT  chest in the emergency department revealed extensive bilateral lower lobe segmental pulmonary emboli (almost occlusive in the left lower lobe), so she was placed on heparin.  She was discharged on January 4th on apixaban 5 mg twice a day.  We reviewed the previous imaging reports and there was good shrinkage of the tumor.  When she was seen on January 6th, her white count was elevated, hemoglobin improved and platelets had normalized.  We wanted to give at least a 4th cycle of chemotherapy with reduction in doses. However, we are concerned that she may not be able to tolerate further chemotherapy.  She received a 4th cycle of carboplatin/etoposide beginning January 31st with a 25% dose reduction and tolerated it well.   Her CEA was fairly stable at 42.5 in January, and down to 22.5 in early February.   She completed 4 cycles of chemotherapy with carboplatin/etoposide on February 4th.  INTERVAL HISTORY:  Gabrielle Hudson is here for a nadir evaluation since receiving her first dose of nivolumab/ zometa last week. She reports tolerating treatment well. She remains on continuous oxygen per nasal canula and denies any increase to her shortness of breath or cough. She denies chest pain. She denies fever, chills,  nausea or vomiting. She denies issue with bowel or bladder. Her appetite is good and her weight is up 3 pounds since last visit. CBC today is unremarkable. CMP reveals corrected calcium 6.9 with a normal magnesium 1.9. She states she stopped taking her calcium "a while back".   REVIEW OF SYSTEMS:  Review of Systems  Constitutional: Negative for appetite change, chills, diaphoresis, fatigue, fever and unexpected weight change.  HENT:   Negative for hearing loss, lump/mass, mouth sores, nosebleeds, sore throat, tinnitus, trouble swallowing and voice change.   Eyes: Negative for eye problems and icterus.  Respiratory: Negative for chest tightness, cough, hemoptysis, shortness of breath and wheezing.   Cardiovascular: Negative for chest pain, leg swelling and palpitations.  Gastrointestinal: Negative for abdominal distention, abdominal pain, blood in stool, constipation, diarrhea, nausea, rectal pain and vomiting.  Endocrine: Negative for hot flashes.  Genitourinary: Negative for bladder incontinence, difficulty urinating, dyspareunia, dysuria, frequency, hematuria and nocturia.   Musculoskeletal: Negative for arthralgias, back pain, flank pain, gait problem, myalgias, neck pain and neck stiffness.  Skin: Negative for itching,  rash and wound.  Neurological: Negative for dizziness, extremity weakness, gait problem, headaches, light-headedness, numbness, seizures and speech difficulty.  Hematological: Negative for adenopathy. Does not bruise/bleed easily.  Psychiatric/Behavioral: Negative for confusion, decreased concentration, depression, sleep disturbance and suicidal ideas. The patient is not nervous/anxious.      VITALS:  Blood pressure (!) 142/66, pulse 85, temperature 98.9 F (37.2 C), temperature source Oral, resp. rate 18, height 5\' 3"  (1.6 m), weight 223 lb 6.4 oz (101.3 kg), SpO2 96 %.  Wt Readings from Last 3 Encounters:  06/14/20 223 lb 6.4 oz (101.3 kg)  06/08/20 216 lb 8 oz (98.2 kg)   06/07/20 215 lb 3.2 oz (97.6 kg)    Body mass index is 39.57 kg/m.  Performance status (ECOG): 2 - Symptomatic, <50% confined to bed  PHYSICAL EXAM:  Physical Exam Constitutional:      General: She is not in acute distress.    Appearance: Normal appearance. She is normal weight. She is not ill-appearing, toxic-appearing or diaphoretic.  HENT:     Head: Normocephalic and atraumatic.     Nose: Nose normal. No congestion or rhinorrhea.     Mouth/Throat:     Mouth: Mucous membranes are moist.     Pharynx: Oropharynx is clear. No oropharyngeal exudate or posterior oropharyngeal erythema.  Eyes:     General: No scleral icterus.       Right eye: No discharge.        Left eye: No discharge.     Extraocular Movements: Extraocular movements intact.     Conjunctiva/sclera: Conjunctivae normal.     Pupils: Pupils are equal, round, and reactive to light.  Neck:     Vascular: No carotid bruit.  Cardiovascular:     Rate and Rhythm: Normal rate and regular rhythm.     Heart sounds: No murmur heard. No friction rub. No gallop.   Pulmonary:     Effort: Pulmonary effort is normal. No respiratory distress.     Breath sounds: Normal breath sounds. No stridor. No wheezing, rhonchi or rales.  Chest:     Chest wall: No tenderness.  Abdominal:     General: Abdomen is flat. Bowel sounds are normal. There is no distension.     Palpations: There is no mass.     Tenderness: There is no abdominal tenderness. There is no right CVA tenderness, left CVA tenderness, guarding or rebound.     Hernia: No hernia is present.  Musculoskeletal:        General: No swelling, tenderness, deformity or signs of injury. Normal range of motion.     Cervical back: Normal range of motion and neck supple. No rigidity or tenderness.     Right lower leg: No edema.     Left lower leg: No edema.  Lymphadenopathy:     Cervical: No cervical adenopathy.  Skin:    General: Skin is warm and dry.     Capillary Refill:  Capillary refill takes less than 2 seconds.     Coloration: Skin is not jaundiced or pale.     Findings: No bruising, erythema, lesion or rash.  Neurological:     General: No focal deficit present.     Mental Status: She is alert and oriented to person, place, and time. Mental status is at baseline.     Cranial Nerves: No cranial nerve deficit.     Sensory: No sensory deficit.     Motor: No weakness.     Coordination: Coordination normal.  Gait: Gait normal.     Deep Tendon Reflexes: Reflexes normal.  Psychiatric:        Mood and Affect: Mood normal.        Behavior: Behavior normal.        Thought Content: Thought content normal.        Judgment: Judgment normal.     LABS:   CBC Latest Ref Rng & Units 06/14/2020 06/07/2020 05/15/2020  WBC - 5.1 7.4 6.9  Hemoglobin 12.0 - 16.0 10.7(A) 11.7(A) 9.5(A)  Hematocrit 36 - 46 32(A) 35(A) 28(A)  Platelets 150 - 399 224 226 370   CMP Latest Ref Rng & Units 06/14/2020 06/07/2020 05/15/2020  Glucose 70 - 99 mg/dL - - -  BUN 4 - 21 13 14 14   Creatinine 0.5 - 1.1 0.9 0.9 0.9  Sodium 137 - 147 139 137 134(A)  Potassium 3.4 - 5.3 4.1 3.9 3.9  Chloride 99 - 108 104 100 97(A)  CO2 13 - 22 27(A) 27(A) 28(A)  Calcium 8.7 - 10.7 6.8(A) 9.3 8.5(A)  Total Protein 6.5 - 8.1 g/dL - - -  Total Bilirubin 0.3 - 1.2 mg/dL - - -  Alkaline Phos 25 - 125 111 108 106  AST 13 - 35 19 21 32  ALT 7 - 35 14 16 22     Lab Results  Component Value Date   CEA1 18.3 (H) 05/15/2020   /  CEA  Date Value Ref Range Status  05/15/2020 18.3 (H) 0.0 - 4.7 ng/mL Final    Comment:    (NOTE)                             Nonsmokers          <3.9                             Smokers             <5.6 [employer] Electrochemiluminescence Immunoassay (ECLIA) Values obtained with different assay methods or kits cannot be used interchangeably.  Results cannot be interpreted as absolute evidence of the presence or absence of malignant disease. Performed At: [facility] [place], [place] [id number] Rush Farmer MD [id number]      Lab Results  Component Value Date   TOTALPROTELP 4.7 (L) 03/26/2011   ALBUMINELP 52.5 (L) 03/26/2011   A1GS 8.7 (H) 03/26/2011   A2GS 18.8 (H) 03/26/2011   BETS 4.5 (L) 03/26/2011   BETA2SER 4.7 03/26/2011   GAMS 10.8 (L) 03/26/2011   MSPIKE NOT DETECTED 03/26/2011   SPEI (NOTE) 03/26/2011   Lab Results  Component Value Date   TIBC 226 04/19/2020   TIBC 219 (L) 03/29/2011   FERRITIN 361 04/19/2020   FERRITIN 356 (H) 03/29/2011   IRONPCTSAT 44.2 04/19/2020   IRONPCTSAT 65 (H) 03/29/2011   Lab Results  Component Value Date   LDH 349 (H) 03/26/2011     STUDIES:       Allergies:  Allergies  Allergen Reactions  . Nitroglycerin In D5w Other (See Comments)    "flatlines"  . Nitroglycerin Other (See Comments)    Drops her BP 'flatlines'     Current Medications: Current Outpatient Medications  Medication Sig Dispense Refill  . albuterol (PROVENTIL) (5 MG/ML) 0.5% nebulizer solution Take 2.5 mg by nebulization daily.    Marland Kitchen apixaban (ELIQUIS) 5 MG TABS tablet Take 1 tablet (  5 mg total) by mouth 2 (two) times daily. 60 tablet 1  . budesonide-formoterol (SYMBICORT) 160-4.5 MCG/ACT inhaler Inhale 2 puffs into the lungs 2 (two) times daily.      . calcium citrate-vitamin D (CITRACAL+D) 315-200 MG-UNIT tablet Take 1 tablet by mouth 2 (two) times daily.    . Cholecalciferol (VITAMIN D3) 1.25 MG (50000 UT) CAPS     . Cholecalciferol 25 MCG (1000 UT) tablet Take by mouth.    . cyclobenzaprine (FLEXERIL) 10 MG tablet Take by mouth.    . diltiazem (CARDIZEM CD) 120 MG 24 hr capsule Take 120 mg by mouth daily.    . EUTHYROX 88 MCG tablet Take 88 mcg by mouth daily.    . famotidine (PEPCID) 20 MG tablet Take 20 mg by mouth 2 (two) times daily.    . fluticasone (FLONASE) 50 MCG/ACT nasal spray Place into both nostrils.    . furosemide (LASIX) 20 MG tablet Take 20 mg by mouth as  needed. ONLY TAKES IT HAVING SWELLING    . gabapentin (NEURONTIN) 300 MG capsule Take 300 mg by mouth as needed.    Marland Kitchen guaiFENesin (MUCINEX) 600 MG 12 hr tablet Take 1 tablet by mouth every 12 (twelve) hours.    Marland Kitchen HYDROcodone-acetaminophen (NORCO) 7.5-325 MG tablet Take 1 tablet by mouth every 4 (four) hours as needed for moderate pain. 120 tablet 0  . magnesium oxide (MAG-OX) 400 (241.3 Mg) MG tablet Take 1 tablet (400 mg total) by mouth daily. 30 tablet 5  . meclizine (ANTIVERT) 25 MG tablet Take 0.5-1 tablets (12.5-25 mg total) by mouth 3 (three) times daily as needed for dizziness. 30 tablet 0  . ondansetron (ZOFRAN) 4 MG tablet Take 1 tablet (4 mg total) by mouth every 4 (four) hours as needed for nausea. 90 tablet 3  . OXYGEN Inhale 4 L into the lungs continuous.    . pantoprazole (PROTONIX) 20 MG tablet Take by mouth.    . Potassium Chloride ER 20 MEQ TBCR Take 1 tablet by mouth 2 (two) times daily.    . prochlorperazine (COMPAZINE) 10 MG tablet Take 1 tablet (10 mg total) by mouth every 6 (six) hours as needed for nausea or vomiting. 90 tablet 3  . sodium chloride 1 g tablet Take by mouth.    . Vitamin D, Ergocalciferol, (DRISDOL) 1.25 MG (50000 UNIT) CAPS capsule Take 50,000 Units by mouth once a week.     No current facility-administered medications for this visit.     ASSESSMENT & PLAN:   Assessment:   1. History of stage IIB non-small cell lung cancer in November 2010.  She remains without evidence of recurrent or metastatic disease.  2. History of stage I colon cancer.   3. Severe oxygen dependant COPD.  She continues 4 L of oxygen per nasal cannula.  4. Small cell carcinoma of the left hilar area, which is at least a stage IIIB, diagnosed in October 2021. CT imaging initially revealed a good response.  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   We have switched her to immunotherapy with nivolumab, and zometa. She tolerated her first treatment well with minimal side effects.   5.  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 She received her first  dose of zometa last week.  6. Hypocalcemia. This is most likely related to her first dose of zometa and stopping her oral calcium supplement.    Plan:     We will plan for IV calcium replacement tomorrow and I have asked her to restart her calcium supplement at home. We will see her back in clinic in one week prior to a second dose of nivolumab.   Both the patient and her daughter, who is present for this visit, verbalize understanding of and agreement to the plans discussed today. They know to call the office should any new questions or concerns arise.   [name], NP [facility] AT [facility] [address] [place] [address] Dept: [phone] Dept Fax: [phone]

## 2020-06-15 ENCOUNTER — Other Ambulatory Visit: Payer: Self-pay | Admitting: Pharmacist

## 2020-06-15 ENCOUNTER — Inpatient Hospital Stay: Payer: Medicare Other

## 2020-06-15 LAB — T4: T4, Total: 9 ug/dL (ref 4.5–12.0)

## 2020-06-15 MED FILL — Calcium Gluconate Inj 10%: INTRAVENOUS | Qty: 20 | Status: AC

## 2020-06-15 NOTE — Progress Notes (Addendum)
Sent in request for DOS 06/16/2020 and 06/22/2020 to [employer].

## 2020-06-16 ENCOUNTER — Other Ambulatory Visit: Payer: Self-pay

## 2020-06-16 ENCOUNTER — Encounter: Payer: Self-pay | Admitting: Oncology

## 2020-06-16 ENCOUNTER — Inpatient Hospital Stay: Payer: Medicare Other

## 2020-06-16 VITALS — BP 182/77 | HR 90 | Temp 98.9°F | Resp 20 | Ht 63.0 in | Wt 222.8 lb

## 2020-06-16 DIAGNOSIS — J449 Chronic obstructive pulmonary disease, unspecified: Secondary | ICD-10-CM | POA: Diagnosis not present

## 2020-06-16 DIAGNOSIS — C3411 Malignant neoplasm of upper lobe, right bronchus or lung: Secondary | ICD-10-CM | POA: Diagnosis not present

## 2020-06-16 DIAGNOSIS — E039 Hypothyroidism, unspecified: Secondary | ICD-10-CM | POA: Diagnosis not present

## 2020-06-16 DIAGNOSIS — Z9981 Dependence on supplemental oxygen: Secondary | ICD-10-CM | POA: Diagnosis not present

## 2020-06-16 DIAGNOSIS — I2699 Other pulmonary embolism without acute cor pulmonale: Secondary | ICD-10-CM | POA: Diagnosis not present

## 2020-06-16 DIAGNOSIS — E222 Syndrome of inappropriate secretion of antidiuretic hormone: Secondary | ICD-10-CM | POA: Diagnosis not present

## 2020-06-16 DIAGNOSIS — C7952 Secondary malignant neoplasm of bone marrow: Secondary | ICD-10-CM

## 2020-06-16 DIAGNOSIS — Z7901 Long term (current) use of anticoagulants: Secondary | ICD-10-CM | POA: Diagnosis not present

## 2020-06-16 DIAGNOSIS — C7951 Secondary malignant neoplasm of bone: Secondary | ICD-10-CM

## 2020-06-16 DIAGNOSIS — Z85038 Personal history of other malignant neoplasm of large intestine: Secondary | ICD-10-CM | POA: Diagnosis not present

## 2020-06-16 DIAGNOSIS — Z86711 Personal history of pulmonary embolism: Secondary | ICD-10-CM | POA: Diagnosis not present

## 2020-06-16 DIAGNOSIS — D241 Benign neoplasm of right breast: Secondary | ICD-10-CM | POA: Diagnosis not present

## 2020-06-16 DIAGNOSIS — D649 Anemia, unspecified: Secondary | ICD-10-CM | POA: Diagnosis not present

## 2020-06-16 DIAGNOSIS — Z5112 Encounter for antineoplastic immunotherapy: Secondary | ICD-10-CM | POA: Diagnosis not present

## 2020-06-16 MED ORDER — ZOLEDRONIC ACID 4 MG/100ML IV SOLN: 4.0000 mg | Freq: Once | INTRAVENOUS | Status: DC

## 2020-06-16 MED ORDER — SODIUM CHLORIDE 0.9 % IV SOLN
Freq: Once | INTRAVENOUS | Status: AC
  Filled 2020-06-16: qty 250

## 2020-06-16 MED ORDER — SODIUM CHLORIDE 0.9 % IV SOLN
2.0000 g | Freq: Once | INTRAVENOUS | Status: AC
  Administered 2020-06-16: 2 g via INTRAVENOUS
  Filled 2020-06-16: qty 20

## 2020-06-16 MED ORDER — SODIUM CHLORIDE 0.9 % IV SOLN
Freq: Once | INTRAVENOUS | Status: DC
  Filled 2020-06-16: qty 250

## 2020-06-16 MED ORDER — HEPARIN SOD (PORK) LOCK FLUSH 100 UNIT/ML IV SOLN
500.0000 [IU] | Freq: Once | INTRAVENOUS | Status: AC | PRN
  Administered 2020-06-16: 500 [IU]
  Filled 2020-06-16: qty 5

## 2020-06-16 NOTE — Progress Notes (Signed)
1512: PT STABLE AT TIME OF DISCHARGE

## 2020-06-17 DIAGNOSIS — J45998 Other asthma: Secondary | ICD-10-CM | POA: Diagnosis not present

## 2020-06-22 ENCOUNTER — Other Ambulatory Visit: Payer: Self-pay | Admitting: Hematology and Oncology

## 2020-06-22 ENCOUNTER — Inpatient Hospital Stay: Payer: Medicare Other

## 2020-06-22 ENCOUNTER — Other Ambulatory Visit: Payer: Self-pay

## 2020-06-22 ENCOUNTER — Inpatient Hospital Stay (INDEPENDENT_AMBULATORY_CARE_PROVIDER_SITE_OTHER): Payer: Medicare Other | Admitting: Hematology and Oncology

## 2020-06-22 ENCOUNTER — Encounter: Payer: Self-pay | Admitting: Hematology and Oncology

## 2020-06-22 ENCOUNTER — Ambulatory Visit: Payer: Medicare Other | Admitting: Hematology and Oncology

## 2020-06-22 VITALS — BP 168/74 | HR 90 | Temp 99.1°F | Resp 18 | Ht 63.0 in | Wt 221.5 lb

## 2020-06-22 DIAGNOSIS — D649 Anemia, unspecified: Secondary | ICD-10-CM | POA: Diagnosis not present

## 2020-06-22 DIAGNOSIS — E871 Hypo-osmolality and hyponatremia: Secondary | ICD-10-CM

## 2020-06-22 DIAGNOSIS — I2699 Other pulmonary embolism without acute cor pulmonale: Secondary | ICD-10-CM | POA: Diagnosis not present

## 2020-06-22 DIAGNOSIS — E559 Vitamin D deficiency, unspecified: Secondary | ICD-10-CM | POA: Diagnosis not present

## 2020-06-22 DIAGNOSIS — C3432 Malignant neoplasm of lower lobe, left bronchus or lung: Secondary | ICD-10-CM | POA: Diagnosis not present

## 2020-06-22 DIAGNOSIS — Z5112 Encounter for antineoplastic immunotherapy: Secondary | ICD-10-CM | POA: Diagnosis not present

## 2020-06-22 DIAGNOSIS — Z7901 Long term (current) use of anticoagulants: Secondary | ICD-10-CM | POA: Diagnosis not present

## 2020-06-22 DIAGNOSIS — Z85038 Personal history of other malignant neoplasm of large intestine: Secondary | ICD-10-CM | POA: Diagnosis not present

## 2020-06-22 DIAGNOSIS — E039 Hypothyroidism, unspecified: Secondary | ICD-10-CM | POA: Diagnosis not present

## 2020-06-22 DIAGNOSIS — J449 Chronic obstructive pulmonary disease, unspecified: Secondary | ICD-10-CM | POA: Diagnosis not present

## 2020-06-22 DIAGNOSIS — C7951 Secondary malignant neoplasm of bone: Secondary | ICD-10-CM | POA: Diagnosis not present

## 2020-06-22 DIAGNOSIS — Z9981 Dependence on supplemental oxygen: Secondary | ICD-10-CM | POA: Diagnosis not present

## 2020-06-22 DIAGNOSIS — C3411 Malignant neoplasm of upper lobe, right bronchus or lung: Secondary | ICD-10-CM | POA: Diagnosis not present

## 2020-06-22 DIAGNOSIS — Z86711 Personal history of pulmonary embolism: Secondary | ICD-10-CM | POA: Diagnosis not present

## 2020-06-22 DIAGNOSIS — E222 Syndrome of inappropriate secretion of antidiuretic hormone: Secondary | ICD-10-CM

## 2020-06-22 DIAGNOSIS — C7952 Secondary malignant neoplasm of bone marrow: Secondary | ICD-10-CM | POA: Diagnosis not present

## 2020-06-22 DIAGNOSIS — D241 Benign neoplasm of right breast: Secondary | ICD-10-CM | POA: Diagnosis not present

## 2020-06-22 LAB — BASIC METABOLIC PANEL
BUN: 14 (ref 4–21)
CO2: 26 — AB (ref 13–22)
Chloride: 105 (ref 99–108)
Creatinine: 0.8 (ref 0.5–1.1)
Glucose: 98
Potassium: 4.1 (ref 3.4–5.3)
Sodium: 138 (ref 137–147)

## 2020-06-22 LAB — TSH: TSH: 6.794 u[IU]/mL — ABNORMAL HIGH (ref 0.350–4.500)

## 2020-06-22 LAB — CBC AND DIFFERENTIAL
HCT: 36 (ref 36–46)
Hemoglobin: 11.8 — AB (ref 12.0–16.0)
Neutrophils Absolute: 3.6
Platelets: 262 (ref 150–399)
WBC: 6.1

## 2020-06-22 LAB — HEPATIC FUNCTION PANEL
ALT: 12 (ref 7–35)
AST: 19 (ref 13–35)
Alkaline Phosphatase: 105 (ref 25–125)
Bilirubin, Total: 0.4

## 2020-06-22 LAB — COMPREHENSIVE METABOLIC PANEL
Albumin: 4.3 (ref 3.5–5.0)
Calcium: 7.6 — AB (ref 8.7–10.7)

## 2020-06-22 LAB — CBC: RBC: 3.74 — AB (ref 3.87–5.11)

## 2020-06-22 NOTE — Addendum Note (Signed)
Addended by: Juanetta Beets on: 06/22/2020 04:18 PM   Modules accepted: Orders

## 2020-06-22 NOTE — Progress Notes (Signed)
[place]  [address] [place],  [place]  [address] [phone]  Clinic Day:  06/22/2020  Referring physician: Helen Hashimoto., MD   CHIEF COMPLAINT:  CC:  Metastatic small cell lung cancer  Current Treatment:   Palliative nivolumab every 2 weeks, as well as zoledronic acid every 4 weeks.   HISTORY OF PRESENT ILLNESS:  Gabrielle Hudson Gabrielle Hudson is a 80 y.o. female with a history of stage IIB non-small-cell lung cancer of the right upper lobe diagnosed in November 2010.  She had a large endobronchial component causing obstruction, so underwent laser therapy at [facility] with a good response.  Due to severe emphysema and the location of the tumor being within 2 cm of the carina, she was not a surgical candidate.  She was treated with concurrent radiation and chemotherapy with carboplatin and paclitaxel, followed by an additional 2 months of carboplatin and paclitaxel with a good response.  She had bilateral pneumonia in December 2012.  CT scan at that time revealed some abnormality concerning for recurrence, so a PET scan was obtained in January 2013.  There was mild hypermetabolic activity in the right hilum, but the patient opted for observation.  Repeat CT scans had been stable, with scarring of the posterior right upper lobe.  She also has a history of a stage I adenocarcinoma of the colon, arising from a tubulovillous adenoma.  Her colonoscopy in August 2012 had removal of multiple benign polyps.  Her last mammogram was in December 2017.  She had her Port-A-Cath removed in May 2018. She quit smoking about 11 years ago.  CT chest in December 2018 revealed progression of amorphous soft tissue attenuation of the right hilum.  There was no definite lymphadenopathy.  There were stable scattered tiny pulmonary nodules all less than 5 mm.  There is evidence of emphysema.  There is a stable right liver lesion measuring 2.5 cm which was  unchanged for over 5 years.  CT chest in March of 2019 and September of 2019 were stable, with soft tissue attenuation is in the area of previous radiation.  CT chest in October of 2020 revealed all previously noted pulmonary nodules measuring  4 mm or less in size are stable compared to the prior examination, and are considered definitively benign. She also underwent bilateral diagnostic mammogram and right breast ultrasound which revealed the likely benign mass in the right breast at 2:30 is stable. CEA had decreased from 4.6 to 4.4.    She was referred back from [facility] in October 2021 after diagnosis of a new small cell lung cancer.  She presented with hemoptysis and pain of the left lower thorax.  CT revealed tumor encasing and narrowing the left lower lobe pulmonary artery and the left lower lobe bronchus.  There is a mass of the left hilum measuring 5.2 cm with left lower lobe atelectasis.  She also has moderate emphysema and mild cardiomegaly, with evidence of coronary artery disease.  Biopsy on October 19th revealed small cell carcinoma with crush artifact.  She had associated hyponatremia as low as 125, and required demeclocycline.  She was also placed on prednisone, 40 mg daily for 5 days, then 20 mg daily.  She complains of occasional cough productive of creamy colored sputum and occasional wheezing but no further hemoptysis.  She is using a lidoderm patch for her left sided chest pain with partial relief.  She had been hospitalized several times in August  of this year for hyponatremia before her new lung cancer was diagnosed.  In retrospect, the left hilar cancer was not obvious on chest x-ray.  She was found to be iron deficient and her potassium was down to 3.0.  Baseline CEA was 14.1.  She received 3 cycles of carboplatin/etoposide and has problems with hypomagnesemia  Her CEA had increased in November to 48.3 from 14.1 in October, so we were concerned she may not be  responding.  After 3 cycles of carboplatin/etoposide, she was hospitalized with severe weakness and diarrhea.  She was found to have severe neutropenia, which worsened during hospitalization to as low as 0.5 with an ANC of 80, despite Neulasta.  Her white blood count finally improved to 1.5 with an Franklin of 650.  Her hemoglobin was down to 7. Her platelets steadily decreased daily from 140,000 at admission down to 17,000.  She denied bleeding, but did have severe bruising.  She received platelets and PRBC's during hospitalization.  Her platelets were 50,000 on discharge.  She also had a UTI with Klebsiella Pneumoniae, which was resistant only to ampicillin and was treated with IV ceftrixone.  CT  chest in the emergency department revealed extensive bilateral lower lobe segmental pulmonary emboli (almost occlusive in the left lower lobe), so she was placed on heparin.  She was discharged on January 4th on apixaban 5 mg twice a day.  We reviewed the previous imaging reports and there was good shrinkage of the tumor.  When she was seen on January 6th, her white count was elevated, hemoglobin improved and platelets had normalized.  We wanted to give at least a 4th cycle of chemotherapy with reduction in doses. However, we are concerned that she may not be able to tolerate further chemotherapy.  She received a 4th cycle of carboplatin/etoposide beginning January 31st with a 25% dose reduction and tolerated it well.   Her CEA was fairly stable at 42.5 in January, and down to 22.5 in early February. She completed 4 cycles of chemotherapy with carboplatin/etoposide on February 4th.  Repeat imaging on March 7th, unfortunately, revealed new osseous metastasis.  There were radiation changes in the right lung, but no obvious progressive disease. Due to the bone metastasis, she was placed on palliative immunotherapy with pembrolizumab every 2 weeks, as well as zoledronic acid every 4 weeks.  She received her 1st cycle of  pembrolizumab along with zoledronic acid on March 17th.  INTERVAL HISTORY:  Gabrielle Hudson is here today for repeat clinical assessment prior to a 2nd cycle of nivolumab. She states she is only using her oxygen at night.  She denies progressive cough or dyspnea. She denies fevers or chills. She reports back and hip pain for which she is using hydrocodone/APAP as needed. Her appetite is good. Her weight has been stable.  REVIEW OF SYSTEMS:  Review of Systems  Constitutional: Negative for appetite change, chills, fatigue, fever and unexpected weight change.  HENT:   Negative for lump/mass, mouth sores and sore throat.   Respiratory: Positive for cough (stable, chronic) and shortness of breath (stable, chronic).   Cardiovascular: Negative for chest pain and leg swelling.  Gastrointestinal: Negative for abdominal pain, constipation, diarrhea, nausea and vomiting.  Endocrine: Negative for hot flashes.  Genitourinary: Negative for difficulty urinating, dysuria, frequency and hematuria.   Musculoskeletal: Positive for arthralgias (Bilateral hips) and back pain. Negative for myalgias.  Skin: Negative for rash.  Neurological: Negative for dizziness and headaches.  Hematological: Negative for adenopathy. Does not bruise/bleed easily.  Psychiatric/Behavioral: Negative for depression and sleep disturbance. The patient is not nervous/anxious.      VITALS:  Blood pressure (!) 168/74, pulse 90, temperature 99.1 F (37.3 C), temperature source Oral, resp. rate 18, height 5\' 3"  (1.6 m), weight 221 lb 8 oz (100.5 kg), SpO2 94 %.  Wt Readings from Last 3 Encounters:  06/22/20 221 lb 8 oz (100.5 kg)  06/16/20 222 lb 12 oz (101 kg)  06/14/20 223 lb 6.4 oz (101.3 kg)    Body mass index is 39.24 kg/m.  Performance status (ECOG): 2 - Symptomatic, <50% confined to bed  PHYSICAL EXAM:  Physical Exam Vitals and nursing note reviewed.  Constitutional:      General: She is not in acute distress.    Appearance:  Normal appearance.  HENT:     Head: Normocephalic and atraumatic.     Mouth/Throat:     Mouth: Mucous membranes are moist.     Pharynx: Oropharynx is clear. No oropharyngeal exudate or posterior oropharyngeal erythema.  Eyes:     General: No scleral icterus.    Extraocular Movements: Extraocular movements intact.     Conjunctiva/sclera: Conjunctivae normal.     Pupils: Pupils are equal, round, and reactive to light.  Cardiovascular:     Rate and Rhythm: Normal rate and regular rhythm.     Heart sounds: Normal heart sounds. No murmur heard. No friction rub. No gallop.   Pulmonary:     Effort: Pulmonary effort is normal.     Breath sounds: Normal breath sounds. No rhonchi or rales.  Abdominal:     General: There is no distension.     Palpations: Abdomen is soft. There is no hepatomegaly, splenomegaly or mass.     Tenderness: There is no abdominal tenderness.  Musculoskeletal:        General: Normal range of motion.     Cervical back: Normal range of motion and neck supple. No tenderness.     Right lower leg: No edema.     Left lower leg: No edema.  Lymphadenopathy:     Cervical: No cervical adenopathy.  Skin:    General: Skin is warm and dry.     Coloration: Skin is not jaundiced.     Findings: No rash.  Neurological:     Mental Status: She is alert and oriented to person, place, and time.     Cranial Nerves: No cranial nerve deficit.  Psychiatric:        Mood and Affect: Mood normal.        Behavior: Behavior normal.        Thought Content: Thought content normal.    LABS:   CBC Latest Ref Rng & Units 06/22/2020 06/14/2020 06/07/2020  WBC - 6.1 5.1 7.4  Hemoglobin 12.0 - 16.0 11.8(A) 10.7(A) 11.7(A)  Hematocrit 36 - 46 36 32(A) 35(A)  Platelets 150 - 399 262 224 226   CMP Latest Ref Rng & Units 06/22/2020 06/14/2020 06/07/2020  Glucose 70 - 99 mg/dL - - -  BUN 4 - 21 14 13 14   Creatinine 0.5 - 1.1 0.8 0.9 0.9  Sodium 137 - 147 138 139 137  Potassium 3.4 - 5.3 4.1 4.1  3.9  Chloride 99 - 108 105 104 100  CO2 13 - 22 26(A) 27(A) 27(A)  Calcium 8.7 - 10.7 7.6(A) 6.8(A) 9.3  Total Protein 6.5 - 8.1 g/dL - - -  Total Bilirubin 0.3 - 1.2 mg/dL - - -  Alkaline Phos 25 - 125 105  111 108  AST 13 - 35 19 19 21   ALT 7 - 35 12 14 16      Lab Results  Component Value Date   CEA1 18.3 (H) 05/15/2020   /  CEA  Date Value Ref Range Status  05/15/2020 18.3 (H) 0.0 - 4.7 ng/mL Final    Comment:    (NOTE)                             Nonsmokers          <3.9                             Smokers             <5.6 Roche [employer] Electrochemiluminescence Immunoassay (ECLIA) Values obtained with different assay methods or kits cannot be used interchangeably.  Results cannot be interpreted as absolute evidence of the presence or absence of malignant disease. Performed At: [facility] [place], [place] [id number] Rush Farmer MD NI:[id number]    No results found for: PSA1 No results found for: CAN199 No results found for: GHW299  Lab Results  Component Value Date   TOTALPROTELP 4.7 (L) 03/26/2011   ALBUMINELP 52.5 (L) 03/26/2011   A1GS 8.7 (H) 03/26/2011   A2GS 18.8 (H) 03/26/2011   BETS 4.5 (L) 03/26/2011   BETA2SER 4.7 03/26/2011   GAMS 10.8 (L) 03/26/2011   MSPIKE NOT DETECTED 03/26/2011   SPEI (NOTE) 03/26/2011   Lab Results  Component Value Date   TIBC 226 04/19/2020   TIBC 219 (L) 03/29/2011   FERRITIN 361 04/19/2020   FERRITIN 356 (H) 03/29/2011   IRONPCTSAT 44.2 04/19/2020   IRONPCTSAT 65 (H) 03/29/2011   Lab Results  Component Value Date   LDH 349 (H) 03/26/2011    STUDIES:  No results found.    HISTORY:   Past Medical History:  Diagnosis Date  . Anemia, unspecified   . Anemia, unspecified   . Cancer ([place]) 01/2009   SCC of the lung  . COPD (chronic obstructive pulmonary disease) (HCC) 2-3 LPM  . Diabetes mellitus newly diagnosed  . GERD (gastroesophageal reflux disease)   . Hyposmolality  syndrome   . Hyposmolality syndrome   . Hypothyroidism   . Malignant neoplasm of overlapping sites of right lung ([place])   . Malignant neoplasm of overlapping sites of right lung ([place])   . Malignant neoplasm of sigmoid colon ([place])   . Malignant neoplasm of sigmoid colon ([place])   . Shortness of breath   . Sleep apnea     Past Surgical History:  Procedure Laterality Date  . COLON SURGERY    . PORTACATH PLACEMENT  2010    Family History  Problem Relation Age of Onset  . Colon cancer Father   . Prostate cancer Father     Social History:  reports that she quit smoking about 12 years ago. Her smoking use included cigarettes. She has never used smokeless tobacco. She reports that she does not drink alcohol and does not use drugs.The patient is alone today.  Allergies:  Allergies  Allergen Reactions  . Nitroglycerin In D5w Other (See Comments)    "flatlines"  . Nitroglycerin Other (See Comments)    Drops her BP 'flatlines'     Current Medications: Current Outpatient Medications  Medication Sig Dispense Refill  . albuterol (PROVENTIL) (5 MG/ML) 0.5% nebulizer solution Take 2.5  mg by nebulization daily.    Marland Kitchen apixaban (ELIQUIS) 5 MG TABS tablet Take 1 tablet (5 mg total) by mouth 2 (two) times daily. 60 tablet 1  . budesonide-formoterol (SYMBICORT) 160-4.5 MCG/ACT inhaler Inhale 2 puffs into the lungs 2 (two) times daily.      . calcium citrate-vitamin D (CITRACAL+D) 315-200 MG-UNIT tablet Take 1 tablet by mouth 2 (two) times daily.    . Cholecalciferol (VITAMIN D3) 1.25 MG (50000 UT) CAPS     . Cholecalciferol 25 MCG (1000 UT) tablet Take by mouth.    . cyclobenzaprine (FLEXERIL) 10 MG tablet Take by mouth.    . diltiazem (CARDIZEM CD) 120 MG 24 hr capsule Take 120 mg by mouth daily.    . EUTHYROX 88 MCG tablet Take 88 mcg by mouth daily.    . famotidine (PEPCID) 20 MG tablet Take 20 mg by mouth 2 (two) times daily.    . fluticasone (FLONASE) 50 MCG/ACT nasal spray Place into both  nostrils.    . furosemide (LASIX) 20 MG tablet Take 20 mg by mouth as needed. ONLY TAKES IT HAVING SWELLING    . gabapentin (NEURONTIN) 300 MG capsule Take 300 mg by mouth as needed.    Marland Kitchen guaiFENesin (MUCINEX) 600 MG 12 hr tablet Take 1 tablet by mouth every 12 (twelve) hours.    Marland Kitchen HYDROcodone-acetaminophen (NORCO) 7.5-325 MG tablet Take 1 tablet by mouth every 4 (four) hours as needed for moderate pain. 120 tablet 0  . magnesium oxide (MAG-OX) 400 (241.3 Mg) MG tablet Take 1 tablet (400 mg total) by mouth daily. 30 tablet 5  . meclizine (ANTIVERT) 25 MG tablet Take 0.5-1 tablets (12.5-25 mg total) by mouth 3 (three) times daily as needed for dizziness. 30 tablet 0  . ondansetron (ZOFRAN) 4 MG tablet Take 1 tablet (4 mg total) by mouth every 4 (four) hours as needed for nausea. 90 tablet 3  . OXYGEN Inhale 4 L into the lungs continuous.    . pantoprazole (PROTONIX) 20 MG tablet Take by mouth.    . Potassium Chloride ER 20 MEQ TBCR Take 1 tablet by mouth 2 (two) times daily.    . prochlorperazine (COMPAZINE) 10 MG tablet Take 1 tablet (10 mg total) by mouth every 6 (six) hours as needed for nausea or vomiting. 90 tablet 3  . sodium chloride 1 g tablet Take by mouth.    . Vitamin D, Ergocalciferol, (DRISDOL) 1.25 MG (50000 UNIT) CAPS capsule Take 50,000 Units by mouth once a week.     No current facility-administered medications for this visit.     ASSESSMENT & PLAN:   Assessment:  1. History of stage IIB non-small cell lung cancer in November 2010.  She remains without evidence of recurrent or metastatic disease.  2. History of stage I colon cancer.   3. Severe oxygen dependant COPD.  She is now using oxygen as needed.  4. Small cell carcinoma of the left hilar area at least a stage IIIB, diagnosed in October 2021. CT imaging initially revealed a good response to chemotherapy with carboplatin/etoposide.  However, she developed new bone metastasis with sclerotic lesions in the L2 and L4  vertebral bodies, left sacrum and left ischium, as well as mild changes of avascular necrosis in both femoral heads.  She is now receiving palliative immunotherapy with nivolumab,  As well as zoledronic acid. She is due for nivolumab tomorrow.  5.  New bone metastases at L2 and L4 vertebral bodies, left sacrum and left  ischium, as well as mild changes of avascular necrosis in both femoral heads. She received her first dose of zometa last week.  6. Hypocalcemia, felt to be likely due to zoledronic acid, improved with IV and oral calcium supplement.  As she still has persistent hypocalcemia, I will give her IV calcium again tomorrow .  I will have her increase her oral calcium supplement to 3 times daily.   Plan:  We will proceed with nivolumab tomorrow.  She will at receive additional IV calcium 2 g tomorrow as well. We will plan to see her back in 2 weeks with a CBC and comprehensive metabolic panel prior to a 3rd cycle of nivolumab. The patient understands the plans discussed today and is in agreement with them.  She knows to contact our office if she develops concerns prior to her next appointment.     [name], PA-C

## 2020-06-23 ENCOUNTER — Inpatient Hospital Stay: Payer: Medicare Other | Attending: Hematology and Oncology

## 2020-06-23 VITALS — BP 169/76 | HR 88 | Temp 97.9°F | Resp 18 | Ht 63.0 in | Wt 222.0 lb

## 2020-06-23 DIAGNOSIS — Z79899 Other long term (current) drug therapy: Secondary | ICD-10-CM | POA: Insufficient documentation

## 2020-06-23 DIAGNOSIS — E222 Syndrome of inappropriate secretion of antidiuretic hormone: Secondary | ICD-10-CM

## 2020-06-23 DIAGNOSIS — C7951 Secondary malignant neoplasm of bone: Secondary | ICD-10-CM | POA: Diagnosis not present

## 2020-06-23 DIAGNOSIS — Z7901 Long term (current) use of anticoagulants: Secondary | ICD-10-CM | POA: Insufficient documentation

## 2020-06-23 DIAGNOSIS — Z9981 Dependence on supplemental oxygen: Secondary | ICD-10-CM | POA: Insufficient documentation

## 2020-06-23 DIAGNOSIS — D241 Benign neoplasm of right breast: Secondary | ICD-10-CM | POA: Diagnosis not present

## 2020-06-23 DIAGNOSIS — C3411 Malignant neoplasm of upper lobe, right bronchus or lung: Secondary | ICD-10-CM | POA: Insufficient documentation

## 2020-06-23 DIAGNOSIS — C3432 Malignant neoplasm of lower lobe, left bronchus or lung: Secondary | ICD-10-CM

## 2020-06-23 DIAGNOSIS — Z86711 Personal history of pulmonary embolism: Secondary | ICD-10-CM | POA: Diagnosis not present

## 2020-06-23 DIAGNOSIS — Z5112 Encounter for antineoplastic immunotherapy: Secondary | ICD-10-CM | POA: Diagnosis not present

## 2020-06-23 DIAGNOSIS — E039 Hypothyroidism, unspecified: Secondary | ICD-10-CM | POA: Insufficient documentation

## 2020-06-23 DIAGNOSIS — E871 Hypo-osmolality and hyponatremia: Secondary | ICD-10-CM

## 2020-06-23 DIAGNOSIS — J449 Chronic obstructive pulmonary disease, unspecified: Secondary | ICD-10-CM | POA: Diagnosis not present

## 2020-06-23 DIAGNOSIS — Z85038 Personal history of other malignant neoplasm of large intestine: Secondary | ICD-10-CM | POA: Insufficient documentation

## 2020-06-23 LAB — T4: T4, Total: 8.4 ug/dL (ref 4.5–12.0)

## 2020-06-23 MED ORDER — SODIUM CHLORIDE 0.9 % IV SOLN
Freq: Once | INTRAVENOUS | Status: DC
  Filled 2020-06-23: qty 250

## 2020-06-23 MED ORDER — SODIUM CHLORIDE 0.9 % IV SOLN
Freq: Once | INTRAVENOUS | Status: AC
  Filled 2020-06-23: qty 250

## 2020-06-23 MED ORDER — SODIUM CHLORIDE 0.9 % IV SOLN
2.0000 g | Freq: Once | INTRAVENOUS | Status: AC
  Administered 2020-06-23: 2 g via INTRAVENOUS
  Filled 2020-06-23: qty 20

## 2020-06-23 MED ORDER — SODIUM CHLORIDE 0.9 % IV SOLN
240.0000 mg | Freq: Once | INTRAVENOUS | Status: AC
  Administered 2020-06-23: 240 mg via INTRAVENOUS
  Filled 2020-06-23: qty 24

## 2020-06-23 MED ORDER — SODIUM CHLORIDE 0.9% FLUSH
10.0000 mL | INTRAVENOUS | Status: DC | PRN
  Administered 2020-06-23: 10 mL
  Filled 2020-06-23: qty 10

## 2020-06-23 MED ORDER — HEPARIN SOD (PORK) LOCK FLUSH 100 UNIT/ML IV SOLN
500.0000 [IU] | Freq: Once | INTRAVENOUS | Status: AC | PRN
  Administered 2020-06-23: 500 [IU]
  Filled 2020-06-23: qty 5

## 2020-06-23 NOTE — Patient Instructions (Signed)
Nivolumab injection What is this medicine? NIVOLUMAB (nye VOL ue mab) is a monoclonal antibody. It treats certain types of cancer. Some of the cancers treated are colon cancer, head and neck cancer, Hodgkin lymphoma, lung cancer, and melanoma. This medicine may be used for other purposes; ask your health care provider or pharmacist if you have questions. COMMON BRAND NAME(S): Opdivo What should I tell my health care provider before I take this medicine? They need to know if you have any of these conditions:  autoimmune diseases like Crohn's disease, ulcerative colitis, or lupus  have had or planning to have an allogeneic stem cell transplant (uses someone else's stem cells)  history of chest radiation  history of organ transplant  nervous system problems like myasthenia gravis or Guillain-Barre syndrome  an unusual or allergic reaction to nivolumab, other medicines, foods, dyes, or preservatives  pregnant or trying to get pregnant  breast-feeding How should I use this medicine? This medicine is for infusion into a vein. It is given by a health care professional in a hospital or clinic setting. A special MedGuide will be given to you before each treatment. Be sure to read this information carefully each time. Talk to your pediatrician regarding the use of this medicine in children. While this drug may be prescribed for children as young as 12 years for selected conditions, precautions do apply. Overdosage: If you think you have taken too much of this medicine contact a poison control center or emergency room at once. NOTE: This medicine is only for you. Do not share this medicine with others. What if I miss a dose? It is important not to miss your dose. Call your doctor or health care professional if you are unable to keep an appointment. What may interact with this medicine? Interactions have not been studied. This list may not describe all possible interactions. Give your health  care provider a list of all the medicines, herbs, non-prescription drugs, or dietary supplements you use. Also tell them if you smoke, drink alcohol, or use illegal drugs. Some items may interact with your medicine. What should I watch for while using this medicine? This drug may make you feel generally unwell. Continue your course of treatment even though you feel ill unless your doctor tells you to stop. You may need blood work done while you are taking this medicine. Do not become pregnant while taking this medicine or for 5 months after stopping it. Women should inform their doctor if they wish to become pregnant or think they might be pregnant. There is a potential for serious side effects to an unborn child. Talk to your health care professional or pharmacist for more information. Do not breast-feed an infant while taking this medicine or for 5 months after stopping it. What side effects may I notice from receiving this medicine? Side effects that you should report to your doctor or health care professional as soon as possible:  allergic reactions like skin rash, itching or hives, swelling of the face, lips, or tongue  breathing problems  blood in the urine  bloody or watery diarrhea or black, tarry stools  changes in emotions or moods  changes in vision  chest pain  cough  dizziness  feeling faint or lightheaded, falls  fever, chills  headache with fever, neck stiffness, confusion, loss of memory, sensitivity to light, hallucination, loss of contact with reality, or seizures  joint pain  mouth sores  redness, blistering, peeling or loosening of the skin, including inside the  mouth  severe muscle pain or weakness  signs and symptoms of high blood sugar such as dizziness; dry mouth; dry skin; fruity breath; nausea; stomach pain; increased hunger or thirst; increased urination  signs and symptoms of kidney injury like trouble passing urine or change in the amount of  urine  signs and symptoms of liver injury like dark yellow or brown urine; general ill feeling or flu-like symptoms; light-colored stools; loss of appetite; nausea; right upper belly pain; unusually weak or tired; yellowing of the eyes or skin  swelling of the ankles, feet, hands  trouble passing urine or change in the amount of urine  unusually weak or tired  weight gain or loss Side effects that usually do not require medical attention (report to your doctor or health care professional if they continue or are bothersome):  bone pain  constipation  decreased appetite  diarrhea  muscle pain  nausea, vomiting  tiredness This list may not describe all possible side effects. Call your doctor for medical advice about side effects. You may report side effects to FDA at 1-800-FDA-1088. Where should I keep my medicine? This drug is given in a hospital or clinic and will not be stored at home. NOTE: This sheet is a summary. It may not cover all possible information. If you have questions about this medicine, talk to your doctor, pharmacist, or health care provider.  2021 Elsevier/Gold Standard (2019-07-14 10:08:25)

## 2020-06-27 NOTE — Progress Notes (Signed)
Sent in a request for DOS 04/12, 04/14, 04/26, 04/28, to [employer].

## 2020-06-29 DIAGNOSIS — J449 Chronic obstructive pulmonary disease, unspecified: Secondary | ICD-10-CM | POA: Diagnosis not present

## 2020-07-03 DIAGNOSIS — I2699 Other pulmonary embolism without acute cor pulmonale: Secondary | ICD-10-CM | POA: Diagnosis not present

## 2020-07-03 DIAGNOSIS — I10 Essential (primary) hypertension: Secondary | ICD-10-CM | POA: Diagnosis not present

## 2020-07-03 DIAGNOSIS — C3492 Malignant neoplasm of unspecified part of left bronchus or lung: Secondary | ICD-10-CM | POA: Diagnosis not present

## 2020-07-03 DIAGNOSIS — E039 Hypothyroidism, unspecified: Secondary | ICD-10-CM | POA: Diagnosis not present

## 2020-07-03 DIAGNOSIS — K219 Gastro-esophageal reflux disease without esophagitis: Secondary | ICD-10-CM | POA: Diagnosis not present

## 2020-07-03 DIAGNOSIS — J449 Chronic obstructive pulmonary disease, unspecified: Secondary | ICD-10-CM | POA: Diagnosis not present

## 2020-07-04 ENCOUNTER — Inpatient Hospital Stay (INDEPENDENT_AMBULATORY_CARE_PROVIDER_SITE_OTHER): Payer: Medicare Other | Admitting: Hematology and Oncology

## 2020-07-04 ENCOUNTER — Other Ambulatory Visit: Payer: Self-pay

## 2020-07-04 ENCOUNTER — Other Ambulatory Visit: Payer: Self-pay | Admitting: Hematology and Oncology

## 2020-07-04 ENCOUNTER — Encounter: Payer: Self-pay | Admitting: Hematology and Oncology

## 2020-07-04 ENCOUNTER — Inpatient Hospital Stay: Payer: Medicare Other

## 2020-07-04 VITALS — BP 154/66 | HR 88 | Temp 98.2°F | Resp 24 | Ht 63.0 in | Wt 223.0 lb

## 2020-07-04 DIAGNOSIS — E222 Syndrome of inappropriate secretion of antidiuretic hormone: Secondary | ICD-10-CM

## 2020-07-04 DIAGNOSIS — C7952 Secondary malignant neoplasm of bone marrow: Secondary | ICD-10-CM

## 2020-07-04 DIAGNOSIS — D241 Benign neoplasm of right breast: Secondary | ICD-10-CM | POA: Diagnosis not present

## 2020-07-04 DIAGNOSIS — E871 Hypo-osmolality and hyponatremia: Secondary | ICD-10-CM

## 2020-07-04 DIAGNOSIS — C3432 Malignant neoplasm of lower lobe, left bronchus or lung: Secondary | ICD-10-CM

## 2020-07-04 DIAGNOSIS — J449 Chronic obstructive pulmonary disease, unspecified: Secondary | ICD-10-CM | POA: Diagnosis not present

## 2020-07-04 DIAGNOSIS — Z5112 Encounter for antineoplastic immunotherapy: Secondary | ICD-10-CM | POA: Diagnosis not present

## 2020-07-04 DIAGNOSIS — C3411 Malignant neoplasm of upper lobe, right bronchus or lung: Secondary | ICD-10-CM | POA: Diagnosis not present

## 2020-07-04 DIAGNOSIS — C3430 Malignant neoplasm of lower lobe, unspecified bronchus or lung: Secondary | ICD-10-CM

## 2020-07-04 DIAGNOSIS — Z7901 Long term (current) use of anticoagulants: Secondary | ICD-10-CM | POA: Diagnosis not present

## 2020-07-04 DIAGNOSIS — E039 Hypothyroidism, unspecified: Secondary | ICD-10-CM | POA: Diagnosis not present

## 2020-07-04 DIAGNOSIS — Z9981 Dependence on supplemental oxygen: Secondary | ICD-10-CM | POA: Diagnosis not present

## 2020-07-04 DIAGNOSIS — Z79899 Other long term (current) drug therapy: Secondary | ICD-10-CM | POA: Diagnosis not present

## 2020-07-04 DIAGNOSIS — D649 Anemia, unspecified: Secondary | ICD-10-CM | POA: Diagnosis not present

## 2020-07-04 DIAGNOSIS — C7951 Secondary malignant neoplasm of bone: Secondary | ICD-10-CM

## 2020-07-04 DIAGNOSIS — Z85038 Personal history of other malignant neoplasm of large intestine: Secondary | ICD-10-CM | POA: Diagnosis not present

## 2020-07-04 DIAGNOSIS — Z86711 Personal history of pulmonary embolism: Secondary | ICD-10-CM | POA: Diagnosis not present

## 2020-07-04 LAB — BASIC METABOLIC PANEL
BUN: 15 (ref 4–21)
CO2: 27 — AB (ref 13–22)
Chloride: 99 (ref 99–108)
Creatinine: 0.9 (ref 0.5–1.1)
Glucose: 165
Potassium: 3.9 (ref 3.4–5.3)
Sodium: 135 — AB (ref 137–147)

## 2020-07-04 LAB — COMPREHENSIVE METABOLIC PANEL
Albumin: 4 (ref 3.5–5.0)
Calcium: 8.4 — AB (ref 8.7–10.7)

## 2020-07-04 LAB — CBC AND DIFFERENTIAL
HCT: 36 (ref 36–46)
Hemoglobin: 11.9 — AB (ref 12.0–16.0)
Neutrophils Absolute: 3.51
Platelets: 215 (ref 150–399)
WBC: 5.4

## 2020-07-04 LAB — HEPATIC FUNCTION PANEL
ALT: 12 (ref 7–35)
AST: 18 (ref 13–35)
Alkaline Phosphatase: 89 (ref 25–125)
Bilirubin, Total: 0.3

## 2020-07-04 LAB — CBC: RBC: 3.85 — AB (ref 3.87–5.11)

## 2020-07-04 LAB — TSH: TSH: 8.594 u[IU]/mL — ABNORMAL HIGH (ref 0.350–4.500)

## 2020-07-04 NOTE — Progress Notes (Signed)
[facility]  [address] [place],  [place]  [address] [phone]  Clinic Day:  07/07/2020  Referring physician: Helen Hashimoto., MD   CHIEF COMPLAINT:  CC:  Metastatic small cell lung cancer  Current Treatment:   Palliative nivolumab every 2 weeks, as well as zoledronic acid every 4 weeks.   HISTORY OF PRESENT ILLNESS:  Gabrielle Hudson is a 80 y.o. female with a history of stage IIB non-small-cell lung cancer of the right upper lobe diagnosed in November 2010.  She had a large endobronchial component causing obstruction, so underwent laser therapy at [facility] with a good response.  Due to severe emphysema and the location of the tumor being within 2 cm of the carina, she was not a surgical candidate.  She was treated with concurrent radiation and chemotherapy with carboplatin and paclitaxel, followed by an additional 2 months of carboplatin and paclitaxel with a good response.  She had bilateral pneumonia in December 2012.  CT scan at that time revealed some abnormality concerning for recurrence, so a PET scan was obtained in January 2013.  There was mild hypermetabolic activity in the right hilum, but the patient opted for observation.  Repeat CT scans had been stable, with scarring of the posterior right upper lobe.  She also has a history of a stage I adenocarcinoma of the colon, arising from a tubulovillous adenoma.  Her colonoscopy in August 2012 had removal of multiple benign polyps.  Her last mammogram was in December 2017.  She had her Port-A-Cath removed in May 2018. She quit smoking about 11 years ago.  CT chest in December 2018 revealed progression of amorphous soft tissue attenuation of the right hilum.  There was no definite lymphadenopathy.  There were stable scattered tiny pulmonary nodules all less than 5 mm.  There is evidence of emphysema.  There is a stable right liver lesion measuring 2.5 cm which was  unchanged for over 5 years.  CT chest in March of 2019 and September of 2019 were stable, with soft tissue attenuation is in the area of previous radiation.  CT chest in October of 2020 revealed all previously noted pulmonary nodules measuring  4 mm or less in size are stable compared to the prior examination, and are considered definitively benign. She also underwent bilateral diagnostic mammogram and right breast ultrasound which revealed the likely benign mass in the right breast at 2:30 is stable. CEA had decreased from 4.6 to 4.4.    She was referred back from [facility] in October 2021 after diagnosis of a new small cell lung cancer.  She presented with hemoptysis and pain of the left lower thorax.  CT revealed tumor encasing and narrowing the left lower lobe pulmonary artery and the left lower lobe bronchus.  There is a mass of the left hilum measuring 5.2 cm with left lower lobe atelectasis.  She also has moderate emphysema and mild cardiomegaly, with evidence of coronary artery disease.  Biopsy on October 19th revealed small cell carcinoma with crush artifact.  She had associated hyponatremia as low as 125, and required demeclocycline.  She was also placed on prednisone, 40 mg daily for 5 days, then 20 mg daily.  She complains of occasional cough productive of creamy colored sputum and occasional wheezing but no further hemoptysis.  She is using a lidoderm patch for her left sided chest pain with partial relief.  She had been hospitalized several times in August  of this year for hyponatremia before her new lung cancer was diagnosed.  In retrospect, the left hilar cancer was not obvious on chest x-ray.  She was found to be iron deficient and her potassium was down to 3.0.  Baseline CEA was 14.1.  She received 3 cycles of carboplatin/etoposide and has problems with hypomagnesemia  Her CEA had increased in November to 48.3 from 14.1 in October, so we were concerned she may not be  responding.  After 3 cycles of carboplatin/etoposide, she was hospitalized with severe weakness and diarrhea.  She was found to have severe neutropenia, which worsened during hospitalization to as low as 0.5 with an ANC of 80, despite Neulasta.  Her white blood count finally improved to 1.5 with an [place] of 650.  Her hemoglobin was down to 7. Her platelets steadily decreased daily from 140,000 at admission down to 17,000.  She denied bleeding, but did have severe bruising.  She received platelets and PRBC's during hospitalization.  Her platelets were 50,000 on discharge.  She also had a UTI with Klebsiella Pneumoniae, which was resistant only to ampicillin and was treated with IV ceftrixone.  CT  chest in the emergency department revealed extensive bilateral lower lobe segmental pulmonary emboli (almost occlusive in the left lower lobe), so she was placed on heparin.  She was discharged on January 4th on apixaban 5 mg twice a day.  We reviewed the previous imaging reports and there was good shrinkage of the tumor.  When she was seen on January 6th, her white count was elevated, hemoglobin improved and platelets had normalized.  We wanted to give at least a 4th cycle of chemotherapy with reduction in doses. However, we are concerned that she may not be able to tolerate further chemotherapy.  She received a 4th cycle of carboplatin/etoposide beginning January 31st with a 25% dose reduction and tolerated it well.   Her CEA was fairly stable at 42.5 in January, and down to 22.5 in early February. She completed 4 cycles of chemotherapy with carboplatin/etoposide on February 4th.  Repeat imaging on March 7th, unfortunately, revealed new osseous metastasis.  There were radiation changes in the right lung, but no obvious progressive disease in the lung. Due to the bone metastasis, she was placed on palliative immunotherapy with pembrolizumab every 2 weeks, as well as zoledronic acid every 4 weeks.  She received her 1st  cycle of pembrolizumab along with zoledronic acid on March 17th. She developed severe hypocalcemia after zoledronic acid requiring IV and resuming oral supplementation.  We therefore planned to delay zoledronic acid until May.  INTERVAL HISTORY:  Gabrielle Hudson is here today for repeat clinical assessment prior to a 3rd cycle of nivolumab.  She states she continues to tolerate nivolumab without difficulty. She denies progressive cough or dyspnea. She denies fevers or chills. She reports chronic back for which she is using hydrocodone/APAP as needed. Her appetite is good. Her weight has been stable.  She continues calcium/vitamin D twice daily  REVIEW OF SYSTEMS:  Review of Systems  Constitutional: Negative for appetite change, chills, fatigue, fever and unexpected weight change.  HENT:   Negative for lump/mass, mouth sores and sore throat.   Respiratory: Positive for cough (stable, chronic) and shortness of breath (stable, chronic).   Cardiovascular: Negative for chest pain and leg swelling.  Gastrointestinal: Negative for abdominal pain, constipation, diarrhea, nausea and vomiting.  Endocrine: Negative for hot flashes.  Genitourinary: Negative for difficulty urinating, dysuria, frequency and hematuria.   Musculoskeletal: Positive for  back pain (Intermittent). Negative for arthralgias and myalgias.  Skin: Negative for rash.  Neurological: Negative for dizziness and headaches.  Hematological: Negative for adenopathy. Does not bruise/bleed easily.  Psychiatric/Behavioral: Negative for depression and sleep disturbance. The patient is not nervous/anxious.      VITALS:  Blood pressure (!) 154/66, pulse 88, temperature 98.2 F (36.8 C), temperature source Oral, resp. rate (!) 24, height 5\' 3"  (1.6 m), weight 223 lb (101.2 kg), SpO2 98 %.  Respirations on examination are 18/min. Wt Readings from Last 3 Encounters:  07/06/20 223 lb 1.3 oz (101.2 kg)  07/04/20 223 lb (101.2 kg)  06/23/20 222 lb (100.7 kg)     Body mass index is 39.5 kg/m.  Performance status (ECOG): 2 - Symptomatic, <50% confined to bed  PHYSICAL EXAM:  Physical Exam Vitals and nursing note reviewed.  Constitutional:      General: She is not in acute distress.    Appearance: Normal appearance.  HENT:     Head: Normocephalic and atraumatic.     Mouth/Throat:     Mouth: Mucous membranes are moist.     Pharynx: Oropharynx is clear. No oropharyngeal exudate or posterior oropharyngeal erythema.  Eyes:     General: No scleral icterus.    Extraocular Movements: Extraocular movements intact.     Conjunctiva/sclera: Conjunctivae normal.     Pupils: Pupils are equal, round, and reactive to light.  Cardiovascular:     Rate and Rhythm: Normal rate and regular rhythm.     Heart sounds: Normal heart sounds. No murmur heard. No friction rub. No gallop.   Pulmonary:     Effort: Pulmonary effort is normal.     Breath sounds: Normal breath sounds. No rhonchi or rales.  Chest:  Breasts:     Right: No axillary adenopathy or supraclavicular adenopathy.     Left: No axillary adenopathy or supraclavicular adenopathy.    Abdominal:     General: There is no distension.     Palpations: Abdomen is soft. There is no hepatomegaly, splenomegaly or mass.     Tenderness: There is no abdominal tenderness.  Musculoskeletal:        General: Normal range of motion.     Cervical back: Normal range of motion and neck supple. No tenderness.     Right lower leg: No edema.     Left lower leg: No edema.  Lymphadenopathy:     Cervical: No cervical adenopathy.     Upper Body:     Right upper body: No supraclavicular or axillary adenopathy.     Left upper body: No supraclavicular or axillary adenopathy.  Skin:    General: Skin is warm and dry.     Coloration: Skin is not jaundiced.     Findings: No rash.  Neurological:     Mental Status: She is alert and oriented to person, place, and time.     Cranial Nerves: No cranial nerve deficit.   Psychiatric:        Mood and Affect: Mood normal.        Behavior: Behavior normal.        Thought Content: Thought content normal.    LABS:   CBC Latest Ref Rng & Units 07/04/2020 06/22/2020 06/14/2020  WBC - 5.4 6.1 5.1  Hemoglobin 12.0 - 16.0 11.9(A) 11.8(A) 10.7(A)  Hematocrit 36 - 46 36 36 32(A)  Platelets 150 - 399 215 262 224   CMP Latest Ref Rng & Units 07/04/2020 06/22/2020 06/14/2020  Glucose 70 -  99 mg/dL - - -  BUN 4 - 21 15 14 13   Creatinine 0.5 - 1.1 0.9 0.8 0.9  Sodium 137 - 147 135(A) 138 139  Potassium 3.4 - 5.3 3.9 4.1 4.1  Chloride 99 - 108 99 105 104  CO2 13 - 22 27(A) 26(A) 27(A)  Calcium 8.7 - 10.7 8.4(A) 7.6(A) 6.8(A)  Total Protein 6.5 - 8.1 g/dL - - -  Total Bilirubin 0.3 - 1.2 mg/dL - - -  Alkaline Phos 25 - 125 89 105 111  AST 13 - 35 18 19 19   ALT 7 - 35 12 12 14      Lab Results  Component Value Date   CEA1 18.3 (H) 05/15/2020   /  CEA  Date Value Ref Range Status  05/15/2020 18.3 (H) 0.0 - 4.7 ng/mL Final    Comment:    (NOTE)                             Nonsmokers          <3.9                             Smokers             <5.6 Roche [employer] Electrochemiluminescence Immunoassay (ECLIA) Values obtained with different assay methods or kits cannot be used interchangeably.  Results cannot be interpreted as absolute evidence of the presence or absence of malignant disease. Performed At: [facility] [place], [place] [id number] Rush Farmer MD FH:[id number]    No results found for: PSA1 No results found for: CAN199 No results found for: HTD428  Lab Results  Component Value Date   TOTALPROTELP 4.7 (L) 03/26/2011   ALBUMINELP 52.5 (L) 03/26/2011   A1GS 8.7 (H) 03/26/2011   A2GS 18.8 (H) 03/26/2011   BETS 4.5 (L) 03/26/2011   BETA2SER 4.7 03/26/2011   GAMS 10.8 (L) 03/26/2011   MSPIKE NOT DETECTED 03/26/2011   SPEI (NOTE) 03/26/2011   Lab Results  Component Value Date   TIBC 226 04/19/2020   TIBC  219 (L) 03/29/2011   FERRITIN 361 04/19/2020   FERRITIN 356 (H) 03/29/2011   IRONPCTSAT 44.2 04/19/2020   IRONPCTSAT 65 (H) 03/29/2011   Lab Results  Component Value Date   LDH 349 (H) 03/26/2011    STUDIES:  No results found.    HISTORY:   Past Medical History:  Diagnosis Date  . Anemia, unspecified   . Anemia, unspecified   . Cancer ([place]) 01/2009   SCC of the lung  . COPD (chronic obstructive pulmonary disease) (HCC) 2-3 LPM  . Diabetes mellitus newly diagnosed  . GERD (gastroesophageal reflux disease)   . Hyposmolality syndrome   . Hyposmolality syndrome   . Hypothyroidism   . Malignant neoplasm of overlapping sites of right lung ([place])   . Malignant neoplasm of overlapping sites of right lung ([place])   . Malignant neoplasm of sigmoid colon ([place])   . Malignant neoplasm of sigmoid colon ([place])   . Shortness of breath   . Sleep apnea     Past Surgical History:  Procedure Laterality Date  . COLON SURGERY    . PORTACATH PLACEMENT  2010    Family History  Problem Relation Age of Onset  . Colon cancer Father   . Prostate cancer Father     Social History:  reports that she quit smoking about  12 years ago. Her smoking use included cigarettes. She has never used smokeless tobacco. She reports that she does not drink alcohol and does not use drugs.The patient is alone today.  Allergies:  Allergies  Allergen Reactions  . Nitroglycerin In D5w Other (See Comments)    "flatlines"  . Nitroglycerin Other (See Comments)    Drops her BP 'flatlines'     Current Medications: Current Outpatient Medications  Medication Sig Dispense Refill  . albuterol (PROVENTIL) (5 MG/ML) 0.5% nebulizer solution Take 2.5 mg by nebulization daily.    Marland Kitchen apixaban (ELIQUIS) 5 MG TABS tablet Take 1 tablet (5 mg total) by mouth 2 (two) times daily. 60 tablet 1  . budesonide-formoterol (SYMBICORT) 160-4.5 MCG/ACT inhaler Inhale 2 puffs into the lungs 2 (two) times daily.      . calcium  citrate-vitamin D (CITRACAL+D) 315-200 MG-UNIT tablet Take 1 tablet by mouth 2 (two) times daily.    . Cholecalciferol (VITAMIN D3) 1.25 MG (50000 UT) CAPS     . Cholecalciferol 25 MCG (1000 UT) tablet Take by mouth.    . cyclobenzaprine (FLEXERIL) 10 MG tablet Take by mouth.    . diltiazem (CARDIZEM CD) 120 MG 24 hr capsule Take 120 mg by mouth daily.    . EUTHYROX 88 MCG tablet Take 88 mcg by mouth daily.    . famotidine (PEPCID) 20 MG tablet Take 20 mg by mouth 2 (two) times daily.    . fluticasone (FLONASE) 50 MCG/ACT nasal spray Place into both nostrils.    . furosemide (LASIX) 20 MG tablet Take 20 mg by mouth as needed. ONLY TAKES IT HAVING SWELLING    . gabapentin (NEURONTIN) 300 MG capsule Take 300 mg by mouth as needed.    Marland Kitchen guaiFENesin (MUCINEX) 600 MG 12 hr tablet Take 1 tablet by mouth every 12 (twelve) hours.    Marland Kitchen HYDROcodone-acetaminophen (NORCO) 7.5-325 MG tablet Take 1 tablet by mouth every 4 (four) hours as needed for moderate pain. 120 tablet 0  . magnesium oxide (MAG-OX) 400 (241.3 Mg) MG tablet Take 1 tablet (400 mg total) by mouth daily. 30 tablet 5  . meclizine (ANTIVERT) 25 MG tablet Take 0.5-1 tablets (12.5-25 mg total) by mouth 3 (three) times daily as needed for dizziness. 30 tablet 0  . ondansetron (ZOFRAN) 4 MG tablet Take 1 tablet (4 mg total) by mouth every 4 (four) hours as needed for nausea. 90 tablet 3  . OXYGEN Inhale 4 L into the lungs continuous.    . pantoprazole (PROTONIX) 20 MG tablet Take by mouth.    . Potassium Chloride ER 20 MEQ TBCR Take 1 tablet by mouth 2 (two) times daily.    . prochlorperazine (COMPAZINE) 10 MG tablet Take 1 tablet (10 mg total) by mouth every 6 (six) hours as needed for nausea or vomiting. 90 tablet 3  . sodium chloride 1 g tablet Take by mouth.    . Vitamin D, Ergocalciferol, (DRISDOL) 1.25 MG (50000 UNIT) CAPS capsule Take 50,000 Units by mouth once a week.     No current facility-administered medications for this visit.      ASSESSMENT & PLAN:   Assessment:  1. History of stage IIB non-small cell lung cancer in November 2010.  She remains without evidence of recurrent or metastatic disease.  2. History of stage I colon cancer.   3. Severe oxygen dependant COPD.  She is now using oxygen as needed.  4. Small cell carcinoma of the left hilar area at least a  stage IIIB, diagnosed in October 2021. CT imaging initially revealed a good response to chemotherapy with carboplatin/etoposide.  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  She is now receiving palliative immunotherapy with nivolumab,  As well as zoledronic acid. She is due for nivolumab tomorrow.  5.  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 She was placed on zoledronic acid in March.  6. Hypocalcemia, felt to be likely due to zoledronic acid, improved with IV and oral calcium supplement. Her calcium is just normal today. We will plan to hold zoledronic acid until next month as planned.  Plan:  We will proceed with a 3rd cycle nivolumab this week. We will plan to see her back in 2 weeks with a CBC and comprehensive metabolic panel prior to a 4th cycle.  We will plan to resume zoledronic acid next month as long as her calcium continues to improve. The patient understands the plans discussed today and is in agreement with them.  She knows to contact our office if she develops concerns prior to her next appointment.     [name], PA-C

## 2020-07-04 NOTE — Progress Notes (Signed)
t

## 2020-07-05 LAB — T4: T4, Total: 8.6 ug/dL (ref 4.5–12.0)

## 2020-07-06 ENCOUNTER — Other Ambulatory Visit: Payer: Self-pay

## 2020-07-06 ENCOUNTER — Inpatient Hospital Stay: Payer: Medicare Other

## 2020-07-06 VITALS — BP 141/68 | HR 92 | Temp 98.4°F | Resp 20 | Ht 63.0 in | Wt 223.1 lb

## 2020-07-06 DIAGNOSIS — J449 Chronic obstructive pulmonary disease, unspecified: Secondary | ICD-10-CM | POA: Diagnosis not present

## 2020-07-06 DIAGNOSIS — C7951 Secondary malignant neoplasm of bone: Secondary | ICD-10-CM | POA: Diagnosis not present

## 2020-07-06 DIAGNOSIS — Z79899 Other long term (current) drug therapy: Secondary | ICD-10-CM | POA: Diagnosis not present

## 2020-07-06 DIAGNOSIS — Z9981 Dependence on supplemental oxygen: Secondary | ICD-10-CM | POA: Diagnosis not present

## 2020-07-06 DIAGNOSIS — E039 Hypothyroidism, unspecified: Secondary | ICD-10-CM | POA: Diagnosis not present

## 2020-07-06 DIAGNOSIS — Z85038 Personal history of other malignant neoplasm of large intestine: Secondary | ICD-10-CM | POA: Diagnosis not present

## 2020-07-06 DIAGNOSIS — Z5112 Encounter for antineoplastic immunotherapy: Secondary | ICD-10-CM | POA: Diagnosis not present

## 2020-07-06 DIAGNOSIS — Z7901 Long term (current) use of anticoagulants: Secondary | ICD-10-CM | POA: Diagnosis not present

## 2020-07-06 DIAGNOSIS — Z86711 Personal history of pulmonary embolism: Secondary | ICD-10-CM | POA: Diagnosis not present

## 2020-07-06 DIAGNOSIS — C3411 Malignant neoplasm of upper lobe, right bronchus or lung: Secondary | ICD-10-CM | POA: Diagnosis not present

## 2020-07-06 DIAGNOSIS — D241 Benign neoplasm of right breast: Secondary | ICD-10-CM | POA: Diagnosis not present

## 2020-07-06 DIAGNOSIS — E871 Hypo-osmolality and hyponatremia: Secondary | ICD-10-CM

## 2020-07-06 DIAGNOSIS — C3432 Malignant neoplasm of lower lobe, left bronchus or lung: Secondary | ICD-10-CM

## 2020-07-06 DIAGNOSIS — E222 Syndrome of inappropriate secretion of antidiuretic hormone: Secondary | ICD-10-CM

## 2020-07-06 MED ORDER — SODIUM CHLORIDE 0.9 % IV SOLN
Freq: Once | INTRAVENOUS | Status: AC
  Filled 2020-07-06: qty 250

## 2020-07-06 MED ORDER — HEPARIN SOD (PORK) LOCK FLUSH 100 UNIT/ML IV SOLN
500.0000 [IU] | Freq: Once | INTRAVENOUS | Status: AC | PRN
  Administered 2020-07-06: 500 [IU]
  Filled 2020-07-06: qty 5

## 2020-07-06 MED ORDER — NIVOLUMAB CHEMO INJECTION 100 MG/10ML
240.0000 mg | Freq: Once | INTRAVENOUS | Status: AC
  Administered 2020-07-06: 240 mg via INTRAVENOUS
  Filled 2020-07-06: qty 24

## 2020-07-06 NOTE — Progress Notes (Signed)
Pt d/c stable at 1415
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
## 2020-07-06 NOTE — Patient Instructions (Signed)
[facility] Discharge Instructions for Patients Receiving Chemotherapy  Today you received the following chemotherapy agents Nivolumab  To help prevent nausea and vomiting after your treatment, we encourage you to take your nausea medication as directed   If you develop nausea and vomiting that is not controlled by your nausea medication, call the clinic.   BELOW ARE SYMPTOMS THAT SHOULD BE REPORTED IMMEDIATELY:  *FEVER GREATER THAN 100.5 F  *CHILLS WITH OR WITHOUT FEVER  NAUSEA AND VOMITING THAT IS NOT CONTROLLED WITH YOUR NAUSEA MEDICATION  *UNUSUAL SHORTNESS OF BREATH  *UNUSUAL BRUISING OR BLEEDING  TENDERNESS IN MOUTH AND THROAT WITH OR WITHOUT PRESENCE OF ULCERS  *URINARY PROBLEMS  *BOWEL PROBLEMS  UNUSUAL RASH Items with * indicate a potential emergency and should be followed up as soon as possible.  Feel free to call the clinic should you have any questions or concerns at The clinic phone number is [phone].  Please show the CHEMO ALERT CARD at check-in to the Emergency Department and triage nurse.  Nivolumab injection What is this medicine? NIVOLUMAB (nye VOL ue mab) is a monoclonal antibody. It treats certain types of cancer. Some of the cancers treated are colon cancer, head and neck cancer, Hodgkin lymphoma, lung cancer, and melanoma. This medicine may be used for other purposes; ask your health care provider or pharmacist if you have questions. COMMON BRAND NAME(S): Opdivo What should I tell my health care provider before I take this medicine? They need to know if you have any of these conditions:  autoimmune diseases like Crohn's disease, ulcerative colitis, or lupus  have had or planning to have an allogeneic stem cell transplant (uses someone else's stem cells)  history of chest radiation  history of organ transplant  nervous system problems like myasthenia gravis or Guillain-Barre syndrome  an unusual or allergic  reaction to nivolumab, other medicines, foods, dyes, or preservatives  pregnant or trying to get pregnant  breast-feeding How should I use this medicine? This medicine is for infusion into a vein. It is given by a health care professional in a hospital or clinic setting. A special MedGuide will be given to you before each treatment. Be sure to read this information carefully each time. Talk to your pediatrician regarding the use of this medicine in children. While this drug may be prescribed for children as young as 12 years for selected conditions, precautions do apply. Overdosage: If you think you have taken too much of this medicine contact a poison control center or emergency room at once. NOTE: This medicine is only for you. Do not share this medicine with others. What if I miss a dose? It is important not to miss your dose. Call your doctor or health care professional if you are unable to keep an appointment. What may interact with this medicine? Interactions have not been studied. This list may not describe all possible interactions. Give your health care provider a list of all the medicines, herbs, non-prescription drugs, or dietary supplements you use. Also tell them if you smoke, drink alcohol, or use illegal drugs. Some items may interact with your medicine. What should I watch for while using this medicine? This drug may make you feel generally unwell. Continue your course of treatment even though you feel ill unless your doctor tells you to stop. You may need blood work done while you are taking this medicine. Do not become pregnant while taking this medicine or for 5 months after stopping it. Women   should inform their doctor if they wish to become pregnant or think they might be pregnant. There is a potential for serious side effects to an unborn child. Talk to your health care professional or pharmacist for more information. Do not breast-feed an infant while taking this medicine or  for 5 months after stopping it. What side effects may I notice from receiving this medicine? Side effects that you should report to your doctor or health care professional as soon as possible:  allergic reactions like skin rash, itching or hives, swelling of the face, lips, or tongue  breathing problems  blood in the urine  bloody or watery diarrhea or black, tarry stools  changes in emotions or moods  changes in vision  chest pain  cough  dizziness  feeling faint or lightheaded, falls  fever, chills  headache with fever, neck stiffness, confusion, loss of memory, sensitivity to light, hallucination, loss of contact with reality, or seizures  joint pain  mouth sores  redness, blistering, peeling or loosening of the skin, including inside the mouth  severe muscle pain or weakness  signs and symptoms of high blood sugar such as dizziness; dry mouth; dry skin; fruity breath; nausea; stomach pain; increased hunger or thirst; increased urination  signs and symptoms of kidney injury like trouble passing urine or change in the amount of urine  signs and symptoms of liver injury like dark yellow or brown urine; general ill feeling or flu-like symptoms; light-colored stools; loss of appetite; nausea; right upper belly pain; unusually weak or tired; yellowing of the eyes or skin  swelling of the ankles, feet, hands  trouble passing urine or change in the amount of urine  unusually weak or tired  weight gain or loss Side effects that usually do not require medical attention (report to your doctor or health care professional if they continue or are bothersome):  bone pain  constipation  decreased appetite  diarrhea  muscle pain  nausea, vomiting  tiredness This list may not describe all possible side effects. Call your doctor for medical advice about side effects. You may report side effects to FDA at 1-800-FDA-1088. Where should I keep my medicine? This drug is  given in a hospital or clinic and will not be stored at home. NOTE: This sheet is a summary. It may not cover all possible information. If you have questions about this medicine, talk to your doctor, pharmacist, or health care provider.  2021 Elsevier/Gold Standard (2019-07-14 10:08:25)  

## 2020-07-07 ENCOUNTER — Encounter: Payer: Self-pay | Admitting: Hematology and Oncology
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
## 2020-07-11 ENCOUNTER — Telehealth: Payer: Self-pay

## 2020-07-11 NOTE — Telephone Encounter (Signed)
1433: Patient called to let me know she was doing okay from her incident on 07/06/2020 transportation complaint. She states she filed a police report with authorities. Wanted [place] to know how she was doing. I gave the phone number to [place] Patient concerns Rachael-[phone] if her daughter or her had any further questions or concerns.

## 2020-07-18 ENCOUNTER — Inpatient Hospital Stay: Payer: Medicare Other

## 2020-07-18 ENCOUNTER — Encounter: Payer: Self-pay | Admitting: Hematology and Oncology

## 2020-07-18 ENCOUNTER — Other Ambulatory Visit: Payer: Self-pay

## 2020-07-18 ENCOUNTER — Inpatient Hospital Stay (INDEPENDENT_AMBULATORY_CARE_PROVIDER_SITE_OTHER): Payer: Medicare Other | Admitting: Hematology and Oncology

## 2020-07-18 VITALS — BP 175/74 | HR 79 | Temp 98.3°F | Resp 18 | Ht 63.0 in | Wt 224.8 lb

## 2020-07-18 DIAGNOSIS — Z85038 Personal history of other malignant neoplasm of large intestine: Secondary | ICD-10-CM | POA: Diagnosis not present

## 2020-07-18 DIAGNOSIS — Z5112 Encounter for antineoplastic immunotherapy: Secondary | ICD-10-CM | POA: Diagnosis not present

## 2020-07-18 DIAGNOSIS — C7951 Secondary malignant neoplasm of bone: Secondary | ICD-10-CM | POA: Diagnosis not present

## 2020-07-18 DIAGNOSIS — Z86711 Personal history of pulmonary embolism: Secondary | ICD-10-CM | POA: Diagnosis not present

## 2020-07-18 DIAGNOSIS — C3432 Malignant neoplasm of lower lobe, left bronchus or lung: Secondary | ICD-10-CM

## 2020-07-18 DIAGNOSIS — J449 Chronic obstructive pulmonary disease, unspecified: Secondary | ICD-10-CM | POA: Diagnosis not present

## 2020-07-18 DIAGNOSIS — E039 Hypothyroidism, unspecified: Secondary | ICD-10-CM | POA: Diagnosis not present

## 2020-07-18 DIAGNOSIS — D649 Anemia, unspecified: Secondary | ICD-10-CM | POA: Diagnosis not present

## 2020-07-18 DIAGNOSIS — Z79899 Other long term (current) drug therapy: Secondary | ICD-10-CM | POA: Diagnosis not present

## 2020-07-18 DIAGNOSIS — E222 Syndrome of inappropriate secretion of antidiuretic hormone: Secondary | ICD-10-CM

## 2020-07-18 DIAGNOSIS — J45998 Other asthma: Secondary | ICD-10-CM | POA: Diagnosis not present

## 2020-07-18 DIAGNOSIS — D241 Benign neoplasm of right breast: Secondary | ICD-10-CM | POA: Diagnosis not present

## 2020-07-18 DIAGNOSIS — C3411 Malignant neoplasm of upper lobe, right bronchus or lung: Secondary | ICD-10-CM | POA: Diagnosis not present

## 2020-07-18 DIAGNOSIS — Z7901 Long term (current) use of anticoagulants: Secondary | ICD-10-CM | POA: Diagnosis not present

## 2020-07-18 DIAGNOSIS — Z9981 Dependence on supplemental oxygen: Secondary | ICD-10-CM | POA: Diagnosis not present

## 2020-07-18 DIAGNOSIS — E871 Hypo-osmolality and hyponatremia: Secondary | ICD-10-CM

## 2020-07-18 LAB — BASIC METABOLIC PANEL
BUN: 14 (ref 4–21)
CO2: 27 — AB (ref 13–22)
Chloride: 91 — AB (ref 99–108)
Creatinine: 0.8 (ref 0.5–1.1)
Glucose: 127
Potassium: 4.3 (ref 3.4–5.3)
Sodium: 126 — AB (ref 137–147)

## 2020-07-18 LAB — CBC AND DIFFERENTIAL
HCT: 37 (ref 36–46)
Hemoglobin: 12.3 (ref 12.0–16.0)
Neutrophils Absolute: 3.93
Platelets: 227 (ref 150–399)
WBC: 5.7

## 2020-07-18 LAB — HEPATIC FUNCTION PANEL
ALT: 12 (ref 7–35)
AST: 22 (ref 13–35)
Alkaline Phosphatase: 101 (ref 25–125)
Bilirubin, Total: 0.6

## 2020-07-18 LAB — MAGNESIUM (CC13): Magnesium: 1.7

## 2020-07-18 LAB — COMPREHENSIVE METABOLIC PANEL
Albumin: 4.3 (ref 3.5–5.0)
Calcium: 8.3 — AB (ref 8.7–10.7)

## 2020-07-18 LAB — CBC: RBC: 4.02 (ref 3.87–5.11)

## 2020-07-18 LAB — TSH: TSH: 6.649 u[IU]/mL — ABNORMAL HIGH (ref 0.350–4.500)

## 2020-07-18 NOTE — Progress Notes (Signed)
[facility]  [address] [place],  [place]  [address] [phone]  Clinic Day:  07/18/2020  Referring physician: Helen Hashimoto., MD   CHIEF COMPLAINT:  CC:  A 80 year old female with history of metastatic small cell lung cancer here for 2 week pre-treatment evaluation.  Current Treatment:   Palliative nivolumab every 2 weeks, as well as zoledronic acid every 4 weeks.   HISTORY OF PRESENT ILLNESS:  Gabrielle Hudson is a 80 y.o. female with a history of stage IIB non-small-cell lung cancer of the right upper lobe diagnosed in November 2010.  She had a large endobronchial component causing obstruction, so underwent laser therapy at [facility] with a good response.  Due to severe emphysema and the location of the tumor being within 2 cm of the carina, she was not a surgical candidate.  She was treated with concurrent radiation and chemotherapy with carboplatin and paclitaxel, followed by an additional 2 months of carboplatin and paclitaxel with a good response.  She had bilateral pneumonia in December 2012.  CT scan at that time revealed some abnormality concerning for recurrence, so a PET scan was obtained in January 2013.  There was mild hypermetabolic activity in the right hilum, but the patient opted for observation.  Repeat CT scans had been stable, with scarring of the posterior right upper lobe.  She also has a history of a stage I adenocarcinoma of the colon, arising from a tubulovillous adenoma.  Her colonoscopy in August 2012 had removal of multiple benign polyps.  Her last mammogram was in December 2017.  She had her Port-A-Cath removed in May 2018. She quit smoking about 11 years ago.  CT chest in December 2018 revealed progression of amorphous soft tissue attenuation of the right hilum.  There was no definite lymphadenopathy.  There were stable scattered tiny pulmonary nodules all less than 5 mm.  There is evidence of  emphysema.  There is a stable right liver lesion measuring 2.5 cm which was unchanged for over 5 years.  CT chest in March of 2019 and September of 2019 were stable, with soft tissue attenuation is in the area of previous radiation.  CT chest in October of 2020 revealed all previously noted pulmonary nodules measuring  4 mm or less in size are stable compared to the prior examination, and are considered definitively benign. She also underwent bilateral diagnostic mammogram and right breast ultrasound which revealed the likely benign mass in the right breast at 2:30 is stable. CEA had decreased from 4.6 to 4.4.    She was referred back from [facility] in October 2021 after diagnosis of a new small cell lung cancer.  She presented with hemoptysis and pain of the left lower thorax.  CT revealed tumor encasing and narrowing the left lower lobe pulmonary artery and the left lower lobe bronchus.  There is a mass of the left hilum measuring 5.2 cm with left lower lobe atelectasis.  She also has moderate emphysema and mild cardiomegaly, with evidence of coronary artery disease.  Biopsy on October 19th revealed small cell carcinoma with crush artifact.  She had associated hyponatremia as low as 125, and required demeclocycline.  She was also placed on prednisone, 40 mg daily for 5 days, then 20 mg daily.  She complains of occasional cough productive of creamy colored sputum and occasional wheezing but no further hemoptysis.  She is using a lidoderm patch for her left sided chest  pain with partial relief.  She had been hospitalized several times in August of this year for hyponatremia before her new lung cancer was diagnosed.  In retrospect, the left hilar cancer was not obvious on chest x-ray.  She was found to be iron deficient and her potassium was down to 3.0.  Baseline CEA was 14.1.  She received 3 cycles of carboplatin/etoposide and has problems with hypomagnesemia  Her CEA had increased in November  to 48.3 from 14.1 in October, so we were concerned she may not be responding.  After 3 cycles of carboplatin/etoposide, she was hospitalized with severe weakness and diarrhea.  She was found to have severe neutropenia, which worsened during hospitalization to as low as 0.5 with an ANC of 80, despite Neulasta.  Her white blood count finally improved to 1.5 with an [place] of 650.  Her hemoglobin was down to 7. Her platelets steadily decreased daily from 140,000 at admission down to 17,000.  She denied bleeding, but did have severe bruising.  She received platelets and PRBC's during hospitalization.  Her platelets were 50,000 on discharge.  She also had a UTI with Klebsiella Pneumoniae, which was resistant only to ampicillin and was treated with IV ceftrixone.  CT  chest in the emergency department revealed extensive bilateral lower lobe segmental pulmonary emboli (almost occlusive in the left lower lobe), so she was placed on heparin.  She was discharged on January 4th on apixaban 5 mg twice a day.  We reviewed the previous imaging reports and there was good shrinkage of the tumor.  When she was seen on January 6th, her white count was elevated, hemoglobin improved and platelets had normalized.  We wanted to give at least a 4th cycle of chemotherapy with reduction in doses. However, we are concerned that she may not be able to tolerate further chemotherapy.  She received a 4th cycle of carboplatin/etoposide beginning January 31st with a 25% dose reduction and tolerated it well.   Her CEA was fairly stable at 42.5 in January, and down to 22.5 in early February. She completed 4 cycles of chemotherapy with carboplatin/etoposide on February 4th.  Repeat imaging on March 7th, unfortunately, revealed new osseous metastasis.  There were radiation changes in the right lung, but no obvious progressive disease in the lung. Due to the bone metastasis, she was placed on palliative immunotherapy with pembrolizumab every 2 weeks,  as well as zoledronic acid every 4 weeks.  She received her 1st cycle of pembrolizumab along with zoledronic acid on March 17th. She developed severe hypocalcemia after zoledronic acid requiring IV and resuming oral supplementation.  We therefore planned to delay zoledronic acid until May.  INTERVAL HISTORY:  Gabrielle Hudson is here today for evaluation prior to a 4th cycle of nivolumab.  She states she continues to tolerate nivolumab without difficulty.She denies fever, chills, nausea or vomiting. She denies new shortness of breath or cough. She denies chest pain. She denies issue with bowel or bladder. Her appetite is good and her weight is stable. CBC today is unremarkable. CMP reveals sodium 126 and calcium 8.3.  REVIEW OF SYSTEMS:  Review of Systems  Constitutional: Negative for appetite change, chills, diaphoresis, fatigue, fever and unexpected weight change.  HENT:   Negative for hearing loss, lump/mass, mouth sores, nosebleeds, sore throat, tinnitus, trouble swallowing and voice change.   Eyes: Negative for eye problems and icterus.  Respiratory: Positive for cough (stable, chronic) and shortness of breath (stable, chronic). Negative for chest tightness, hemoptysis and wheezing.  Cardiovascular: Negative for chest pain, leg swelling and palpitations.  Gastrointestinal: Negative for abdominal distention, abdominal pain, blood in stool, constipation, diarrhea, nausea, rectal pain and vomiting.  Endocrine: Negative for hot flashes.  Genitourinary: Negative for bladder incontinence, difficulty urinating, dyspareunia, dysuria, frequency, hematuria and nocturia.   Musculoskeletal: Positive for back pain (Intermittent). Negative for arthralgias, flank pain, gait problem, myalgias, neck pain and neck stiffness.  Skin: Negative for itching, rash and wound.  Neurological: Negative for dizziness, extremity weakness, gait problem, headaches, light-headedness, numbness, seizures and speech difficulty.   Hematological: Negative for adenopathy. Does not bruise/bleed easily.  Psychiatric/Behavioral: Negative for confusion, decreased concentration, depression, sleep disturbance and suicidal ideas. The patient is not nervous/anxious.      VITALS:  Blood pressure (!) 175/74, pulse 79, temperature 98.3 F (36.8 C), temperature source Oral, resp. rate 18, height 5\' 3"  (1.6 m), weight 224 lb 12.8 oz (102 kg), SpO2 100 %.  Respirations on examination are 18/min. Wt Readings from Last 3 Encounters:  07/18/20 224 lb 12.8 oz (102 kg)  07/06/20 223 lb 1.3 oz (101.2 kg)  07/04/20 223 lb (101.2 kg)    Body mass index is 39.82 kg/m.  Performance status (ECOG): 2 - Symptomatic, <50% confined to bed  PHYSICAL EXAM:  Physical Exam Vitals and nursing note reviewed.  Constitutional:      General: She is not in acute distress.    Appearance: Normal appearance. She is normal weight. She is not ill-appearing, toxic-appearing or diaphoretic.  HENT:     Head: Normocephalic and atraumatic.     Nose: Nose normal. No congestion or rhinorrhea.     Mouth/Throat:     Mouth: Mucous membranes are moist.     Pharynx: Oropharynx is clear. No oropharyngeal exudate or posterior oropharyngeal erythema.  Eyes:     General: No scleral icterus.       Right eye: No discharge.        Left eye: No discharge.     Extraocular Movements: Extraocular movements intact.     Conjunctiva/sclera: Conjunctivae normal.     Pupils: Pupils are equal, round, and reactive to light.  Neck:     Vascular: No carotid bruit.  Cardiovascular:     Rate and Rhythm: Normal rate and regular rhythm.     Heart sounds: Normal heart sounds. No murmur heard. No friction rub. No gallop.   Pulmonary:     Effort: Pulmonary effort is normal. No respiratory distress.     Breath sounds: Normal breath sounds. No stridor. No wheezing, rhonchi or rales.  Chest:     Chest wall: No tenderness.  Breasts:     Right: No axillary adenopathy or  supraclavicular adenopathy.     Left: No axillary adenopathy or supraclavicular adenopathy.    Abdominal:     General: Abdomen is flat. Bowel sounds are normal. There is no distension.     Palpations: Abdomen is soft. There is no hepatomegaly, splenomegaly or mass.     Tenderness: There is no abdominal tenderness. There is no right CVA tenderness, left CVA tenderness, guarding or rebound.     Hernia: No hernia is present.  Musculoskeletal:        General: No swelling, tenderness, deformity or signs of injury. Normal range of motion.     Cervical back: Normal range of motion and neck supple. No rigidity or tenderness.     Right lower leg: No edema.     Left lower leg: No edema.  Lymphadenopathy:  Cervical: No cervical adenopathy.     Upper Body:     Right upper body: No supraclavicular or axillary adenopathy.     Left upper body: No supraclavicular or axillary adenopathy.  Skin:    General: Skin is warm and dry.     Capillary Refill: Capillary refill takes less than 2 seconds.     Coloration: Skin is not jaundiced or pale.     Findings: No bruising, erythema, lesion or rash.  Neurological:     General: No focal deficit present.     Mental Status: She is alert and oriented to person, place, and time. Mental status is at baseline.     Cranial Nerves: No cranial nerve deficit.     Sensory: No sensory deficit.     Motor: No weakness.     Coordination: Coordination normal.     Gait: Gait normal.     Deep Tendon Reflexes: Reflexes normal.  Psychiatric:        Mood and Affect: Mood normal.        Behavior: Behavior normal.        Thought Content: Thought content normal.        Judgment: Judgment normal.    LABS:   CBC Latest Ref Rng & Units 07/18/2020 07/04/2020 06/22/2020  WBC - 5.7 5.4 6.1  Hemoglobin 12.0 - 16.0 12.3 11.9(A) 11.8(A)  Hematocrit 36 - 46 37 36 36  Platelets 150 - 399 227 215 262   CMP Latest Ref Rng & Units 07/18/2020 07/04/2020 06/22/2020  Glucose 70 - 99  mg/dL - - -  BUN 4 - 21 14 15 14   Creatinine 0.5 - 1.1 0.8 0.9 0.8  Sodium 137 - 147 126(A) 135(A) 138  Potassium 3.4 - 5.3 4.3 3.9 4.1  Chloride 99 - 108 91(A) 99 105  CO2 13 - 22 27(A) 27(A) 26(A)  Calcium 8.7 - 10.7 8.3(A) 8.4(A) 7.6(A)  Total Protein 6.5 - 8.1 g/dL - - -  Total Bilirubin 0.3 - 1.2 mg/dL - - -  Alkaline Phos 25 - 125 101 89 105  AST 13 - 35 22 18 19   ALT 7 - 35 12 12 12      Lab Results  Component Value Date   CEA1 18.3 (H) 05/15/2020   /  CEA  Date Value Ref Range Status  05/15/2020 18.3 (H) 0.0 - 4.7 ng/mL Final    Comment:    (NOTE)                             Nonsmokers          <3.9                             Smokers             <5.6 [employer] Electrochemiluminescence Immunoassay (ECLIA) Values obtained with different assay methods or kits cannot be used interchangeably.  Results cannot be interpreted as absolute evidence of the presence or absence of malignant disease. Performed At: [facility] [place], [place] [id number] Rush Farmer MD LX:[id number]    No results found for: PSA1 No results found for: CAN199 No results found for: CBU384  Lab Results  Component Value Date   TOTALPROTELP 4.7 (L) 03/26/2011   ALBUMINELP 52.5 (L) 03/26/2011   A1GS 8.7 (H) 03/26/2011   A2GS 18.8 (H) 03/26/2011   BETS 4.5 (L)  03/26/2011   BETA2SER 4.7 03/26/2011   GAMS 10.8 (L) 03/26/2011   MSPIKE NOT DETECTED 03/26/2011   SPEI (NOTE) 03/26/2011   Lab Results  Component Value Date   TIBC 226 04/19/2020   TIBC 219 (L) 03/29/2011   FERRITIN 361 04/19/2020   FERRITIN 356 (H) 03/29/2011   IRONPCTSAT 44.2 04/19/2020   IRONPCTSAT 65 (H) 03/29/2011   Lab Results  Component Value Date   LDH 349 (H) 03/26/2011    STUDIES:  No results found.    HISTORY:   Past Medical History:  Diagnosis Date  . Anemia, unspecified   . Anemia, unspecified   . Cancer ([place]) 01/2009   SCC of the lung  . COPD (chronic obstructive  pulmonary disease) (HCC) 2-3 LPM  . Diabetes mellitus newly diagnosed  . GERD (gastroesophageal reflux disease)   . Hyposmolality syndrome   . Hyposmolality syndrome   . Hypothyroidism   . Malignant neoplasm of overlapping sites of right lung ([place])   . Malignant neoplasm of overlapping sites of right lung ([place])   . Malignant neoplasm of sigmoid colon ([place])   . Malignant neoplasm of sigmoid colon ([place])   . Shortness of breath   . Sleep apnea     Past Surgical History:  Procedure Laterality Date  . COLON SURGERY    . PORTACATH PLACEMENT  2010    Family History  Problem Relation Age of Onset  . Colon cancer Father   . Prostate cancer Father     Social History:  reports that she quit smoking about 12 years ago. Her smoking use included cigarettes. She has never used smokeless tobacco. She reports that she does not drink alcohol and does not use drugs.The patient is alone today.  Allergies:  Allergies  Allergen Reactions  . Nitroglycerin In D5w Other (See Comments)    "flatlines"  . Nitroglycerin Other (See Comments)    Drops her BP 'flatlines'     Current Medications: Current Outpatient Medications  Medication Sig Dispense Refill  . albuterol (PROVENTIL) (5 MG/ML) 0.5% nebulizer solution Take 2.5 mg by nebulization daily.    Marland Kitchen apixaban (ELIQUIS) 5 MG TABS tablet Take 1 tablet (5 mg total) by mouth 2 (two) times daily. 60 tablet 1  . budesonide-formoterol (SYMBICORT) 160-4.5 MCG/ACT inhaler Inhale 2 puffs into the lungs 2 (two) times daily.      . calcium citrate-vitamin D (CITRACAL+D) 315-200 MG-UNIT tablet Take 1 tablet by mouth 2 (two) times daily.    . Cholecalciferol (VITAMIN D3) 1.25 MG (50000 UT) CAPS     . Cholecalciferol 25 MCG (1000 UT) tablet Take by mouth.    . cyclobenzaprine (FLEXERIL) 10 MG tablet Take by mouth.    . diltiazem (CARDIZEM CD) 120 MG 24 hr capsule Take 120 mg by mouth daily.    . EUTHYROX 88 MCG tablet Take 88 mcg by mouth daily.    .  famotidine (PEPCID) 20 MG tablet Take 20 mg by mouth 2 (two) times daily.    . fluticasone (FLONASE) 50 MCG/ACT nasal spray Place into both nostrils.    . furosemide (LASIX) 20 MG tablet Take 20 mg by mouth as needed. ONLY TAKES IT HAVING SWELLING    . gabapentin (NEURONTIN) 300 MG capsule Take 300 mg by mouth as needed.    Marland Kitchen guaiFENesin (MUCINEX) 600 MG 12 hr tablet Take 1 tablet by mouth every 12 (twelve) hours.    Marland Kitchen HYDROcodone-acetaminophen (NORCO) 7.5-325 MG tablet Take 1 tablet by mouth every 4 (four)  hours as needed for moderate pain. 120 tablet 0  . magnesium oxide (MAG-OX) 400 (241.3 Mg) MG tablet Take 1 tablet (400 mg total) by mouth daily. 30 tablet 5  . meclizine (ANTIVERT) 25 MG tablet Take 0.5-1 tablets (12.5-25 mg total) by mouth 3 (three) times daily as needed for dizziness. 30 tablet 0  . ondansetron (ZOFRAN) 4 MG tablet Take 1 tablet (4 mg total) by mouth every 4 (four) hours as needed for nausea. 90 tablet 3  . OXYGEN Inhale 4 L into the lungs continuous.    . pantoprazole (PROTONIX) 20 MG tablet Take by mouth.    . Potassium Chloride ER 20 MEQ TBCR Take 1 tablet by mouth 2 (two) times daily.    . prochlorperazine (COMPAZINE) 10 MG tablet Take 1 tablet (10 mg total) by mouth every 6 (six) hours as needed for nausea or vomiting. 90 tablet 3  . sodium chloride 1 g tablet Take by mouth.    . Vitamin D, Ergocalciferol, (DRISDOL) 1.25 MG (50000 UNIT) CAPS capsule Take 50,000 Units by mouth once a week.     No current facility-administered medications for this visit.     ASSESSMENT & PLAN:   Assessment:  1. History of stage IIB non-small cell lung cancer in November 2010.  She remains without evidence of recurrent or metastatic disease.  2. History of stage I colon cancer.   3. Severe oxygen dependant COPD.  She is now using oxygen as needed.  4. Small cell carcinoma of the left hilar area at least a stage IIIB, diagnosed in October 2021. CT imaging initially revealed a  good response to chemotherapy with carboplatin/etoposide.  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  She is now receiving palliative immunotherapy with nivolumab,  She is due for nivolumab this week.  5.  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 She was placed on zoledronic acid in March. As she had significant drop in her calcium, this was held in April.   6. Hypocalcemia, felt to be likely due to zoledronic acid, improved with IV and oral calcium supplement. Her calcium is just mildly decreased today. We will continue to hold this month's dose and attempt to restart in May. She is to continue on oral replacement.   7. Hyponatremia. She states she does not eat salt in her diet. I advised her that she can include some salt in her diet and we will give normal saline Thursday with treatment.   Plan:  We will proceed with a 4th cycle nivolumab this week. We will plan to see her back in 2 weeks with a CBC and comprehensive metabolic panel prior to a 5th cycle. We will add calcium and 1 liter of sodium chloride to Thursday's appointment.   We will plan to resume zoledronic acid next month as long as her calcium continues to improve. She will continue oral calcium supplement.   She verbalizes understanding of and agreement to the plans discussed today. She knows to call the office should any new questions or concerns arise.      [name], NP

## 2020-07-19 LAB — T4: T4, Total: 9.8 ug/dL (ref 4.5–12.0)

## 2020-07-20 ENCOUNTER — Other Ambulatory Visit: Payer: Self-pay

## 2020-07-20 ENCOUNTER — Inpatient Hospital Stay: Payer: Medicare Other

## 2020-07-20 VITALS — BP 167/75 | HR 86 | Temp 98.2°F | Resp 20 | Ht 63.0 in | Wt 227.2 lb

## 2020-07-20 DIAGNOSIS — Z9981 Dependence on supplemental oxygen: Secondary | ICD-10-CM | POA: Diagnosis not present

## 2020-07-20 DIAGNOSIS — C7951 Secondary malignant neoplasm of bone: Secondary | ICD-10-CM | POA: Diagnosis not present

## 2020-07-20 DIAGNOSIS — Z85038 Personal history of other malignant neoplasm of large intestine: Secondary | ICD-10-CM | POA: Diagnosis not present

## 2020-07-20 DIAGNOSIS — Z5112 Encounter for antineoplastic immunotherapy: Secondary | ICD-10-CM | POA: Diagnosis not present

## 2020-07-20 DIAGNOSIS — J449 Chronic obstructive pulmonary disease, unspecified: Secondary | ICD-10-CM | POA: Diagnosis not present

## 2020-07-20 DIAGNOSIS — C3411 Malignant neoplasm of upper lobe, right bronchus or lung: Secondary | ICD-10-CM | POA: Diagnosis not present

## 2020-07-20 DIAGNOSIS — Z86711 Personal history of pulmonary embolism: Secondary | ICD-10-CM | POA: Diagnosis not present

## 2020-07-20 DIAGNOSIS — Z79899 Other long term (current) drug therapy: Secondary | ICD-10-CM | POA: Diagnosis not present

## 2020-07-20 DIAGNOSIS — E871 Hypo-osmolality and hyponatremia: Secondary | ICD-10-CM

## 2020-07-20 DIAGNOSIS — Z7901 Long term (current) use of anticoagulants: Secondary | ICD-10-CM | POA: Diagnosis not present

## 2020-07-20 DIAGNOSIS — D241 Benign neoplasm of right breast: Secondary | ICD-10-CM | POA: Diagnosis not present

## 2020-07-20 DIAGNOSIS — C3432 Malignant neoplasm of lower lobe, left bronchus or lung: Secondary | ICD-10-CM

## 2020-07-20 DIAGNOSIS — E039 Hypothyroidism, unspecified: Secondary | ICD-10-CM | POA: Diagnosis not present

## 2020-07-20 DIAGNOSIS — E222 Syndrome of inappropriate secretion of antidiuretic hormone: Secondary | ICD-10-CM

## 2020-07-20 MED ORDER — SODIUM CHLORIDE 0.9 % IV SOLN
Freq: Once | INTRAVENOUS | Status: AC
  Filled 2020-07-20: qty 250

## 2020-07-20 MED ORDER — SODIUM CHLORIDE 0.9 % IV SOLN
240.0000 mg | Freq: Once | INTRAVENOUS | Status: AC
  Administered 2020-07-20: 240 mg via INTRAVENOUS
  Filled 2020-07-20: qty 24

## 2020-07-20 MED ORDER — HEPARIN SOD (PORK) LOCK FLUSH 100 UNIT/ML IV SOLN
500.0000 [IU] | Freq: Once | INTRAVENOUS | Status: AC | PRN
  Administered 2020-07-20: 500 [IU]
  Filled 2020-07-20: qty 5

## 2020-07-20 NOTE — Progress Notes (Signed)
1401: PT STABLE AT TIME OF DISCHARGE

## 2020-07-20 NOTE — Patient Instructions (Signed)
Gabrielle Hudson  Discharge Instructions: Thank you for choosing [facility] to provide your oncology and hematology care.  If you have a lab appointment with the [facility], please go directly to the [facility] and check in at the registration area.   Wear comfortable clothing and clothing appropriate for easy access to any Portacath or PICC line.   We strive to give you quality time with your provider. You may need to reschedule your appointment if you arrive late (15 or more minutes).  Arriving late affects you and other patients whose appointments are after yours.  Also, if you miss three or more appointments without notifying the office, you may be dismissed from the clinic at the provider's discretion.      For prescription refill requests, have your pharmacy contact our office and allow 72 hours for refills to be completed.    Today you received the following chemotherapy and/or immunotherapy agents Nivolumab      To help prevent nausea and vomiting after your treatment, we encourage you to take your nausea medication as directed.  BELOW ARE SYMPTOMS THAT SHOULD BE REPORTED IMMEDIATELY: . *FEVER GREATER THAN 100.4 F (38 C) OR HIGHER . *CHILLS OR SWEATING . *NAUSEA AND VOMITING THAT IS NOT CONTROLLED WITH YOUR NAUSEA MEDICATION . *UNUSUAL SHORTNESS OF BREATH . *UNUSUAL BRUISING OR BLEEDING . *URINARY PROBLEMS (pain or burning when urinating, or frequent urination) . *BOWEL PROBLEMS (unusual diarrhea, constipation, pain near the anus) . TENDERNESS IN MOUTH AND THROAT WITH OR WITHOUT PRESENCE OF ULCERS (sore throat, sores in mouth, or a toothache) . UNUSUAL RASH, SWELLING OR PAIN  . UNUSUAL VAGINAL DISCHARGE OR ITCHING   Items with * indicate a potential emergency and should be followed up as soon as possible or go to the Emergency Department if any problems should occur.  Please show the CHEMOTHERAPY ALERT CARD or IMMUNOTHERAPY ALERT CARD at  check-in to the Emergency Department and triage nurse.  Should you have questions after your visit or need to cancel or reschedule your appointment, please contact [facility]  Dept: [phone]  and follow the prompts.  Office hours are 8:00 a.m. to 4:30 p.m. Monday - Friday. Please note that voicemails left after 4:00 p.m. may not be returned until the following business day.  We are closed weekends and major holidays. You have access to a nurse at all times for urgent questions. Please call the main number to the clinic Dept: [phone] and follow the prompts.  For any non-urgent questions, you may also contact your provider using MyChart. We now offer e-Visits for anyone 84 and older to request care online for non-urgent symptoms. For details visit [email or web address].   Also download the MyChart app! Go to the app store, search "MyChart", open the app, select [facility], and log in with your MyChart username and password.  Due to Covid, a mask is required upon entering the hospital/clinic. If you do not have a mask, one will be given to you upon arrival. For doctor visits, patients may have 1 support person aged 60 or older with them. For treatment visits, patients cannot have anyone with them due to current Covid guidelines and our immunocompromised population.

## 2020-07-27 NOTE — Progress Notes (Signed)
Sent in request for DOS 05/10, and 05/12 to [facility].

## 2020-07-29 DIAGNOSIS — J449 Chronic obstructive pulmonary disease, unspecified: Secondary | ICD-10-CM | POA: Diagnosis not present

## 2020-08-01 ENCOUNTER — Encounter: Payer: Self-pay | Admitting: Hematology and Oncology

## 2020-08-01 ENCOUNTER — Ambulatory Visit: Payer: Medicare Other | Admitting: Hematology and Oncology

## 2020-08-01 ENCOUNTER — Telehealth: Payer: Self-pay | Admitting: Oncology

## 2020-08-01 ENCOUNTER — Other Ambulatory Visit: Payer: Self-pay

## 2020-08-01 ENCOUNTER — Inpatient Hospital Stay (INDEPENDENT_AMBULATORY_CARE_PROVIDER_SITE_OTHER): Payer: Medicare Other | Admitting: Hematology and Oncology

## 2020-08-01 ENCOUNTER — Inpatient Hospital Stay: Payer: Medicare Other | Attending: Hematology and Oncology

## 2020-08-01 ENCOUNTER — Other Ambulatory Visit: Payer: Medicare Other

## 2020-08-01 VITALS — BP 180/80 | HR 83 | Temp 98.1°F | Resp 20 | Ht 63.0 in | Wt 225.5 lb

## 2020-08-01 DIAGNOSIS — Z5112 Encounter for antineoplastic immunotherapy: Secondary | ICD-10-CM | POA: Insufficient documentation

## 2020-08-01 DIAGNOSIS — Z7901 Long term (current) use of anticoagulants: Secondary | ICD-10-CM | POA: Diagnosis not present

## 2020-08-01 DIAGNOSIS — J449 Chronic obstructive pulmonary disease, unspecified: Secondary | ICD-10-CM | POA: Diagnosis not present

## 2020-08-01 DIAGNOSIS — C7952 Secondary malignant neoplasm of bone marrow: Secondary | ICD-10-CM | POA: Diagnosis not present

## 2020-08-01 DIAGNOSIS — Z86711 Personal history of pulmonary embolism: Secondary | ICD-10-CM | POA: Diagnosis not present

## 2020-08-01 DIAGNOSIS — C3432 Malignant neoplasm of lower lobe, left bronchus or lung: Secondary | ICD-10-CM | POA: Diagnosis not present

## 2020-08-01 DIAGNOSIS — C3411 Malignant neoplasm of upper lobe, right bronchus or lung: Secondary | ICD-10-CM | POA: Diagnosis not present

## 2020-08-01 DIAGNOSIS — C7951 Secondary malignant neoplasm of bone: Secondary | ICD-10-CM

## 2020-08-01 DIAGNOSIS — Z79899 Other long term (current) drug therapy: Secondary | ICD-10-CM | POA: Insufficient documentation

## 2020-08-01 DIAGNOSIS — E039 Hypothyroidism, unspecified: Secondary | ICD-10-CM | POA: Insufficient documentation

## 2020-08-01 DIAGNOSIS — E871 Hypo-osmolality and hyponatremia: Secondary | ICD-10-CM | POA: Insufficient documentation

## 2020-08-01 DIAGNOSIS — Z9981 Dependence on supplemental oxygen: Secondary | ICD-10-CM | POA: Diagnosis not present

## 2020-08-01 DIAGNOSIS — D241 Benign neoplasm of right breast: Secondary | ICD-10-CM | POA: Insufficient documentation

## 2020-08-01 DIAGNOSIS — Z85038 Personal history of other malignant neoplasm of large intestine: Secondary | ICD-10-CM | POA: Insufficient documentation

## 2020-08-01 DIAGNOSIS — E222 Syndrome of inappropriate secretion of antidiuretic hormone: Secondary | ICD-10-CM

## 2020-08-01 DIAGNOSIS — D649 Anemia, unspecified: Secondary | ICD-10-CM | POA: Diagnosis not present

## 2020-08-01 LAB — COMPREHENSIVE METABOLIC PANEL
Albumin: 4.1 (ref 3.5–5.0)
Calcium: 8.8 (ref 8.7–10.7)

## 2020-08-01 LAB — HEPATIC FUNCTION PANEL
ALT: 11 (ref 7–35)
AST: 17 (ref 13–35)
Alkaline Phosphatase: 90 (ref 25–125)
Bilirubin, Total: 0.4

## 2020-08-01 LAB — CBC AND DIFFERENTIAL
HCT: 36 (ref 36–46)
Hemoglobin: 11.8 — AB (ref 12.0–16.0)
Neutrophils Absolute: 3.58
Platelets: 204 (ref 150–399)
WBC: 5.6

## 2020-08-01 LAB — TSH: TSH: 5.553 u[IU]/mL — ABNORMAL HIGH (ref 0.350–4.500)

## 2020-08-01 LAB — BASIC METABOLIC PANEL
BUN: 14 (ref 4–21)
CO2: 32 — AB (ref 13–22)
Chloride: 99 (ref 99–108)
Creatinine: 0.8 (ref 0.5–1.1)
Glucose: 122
Potassium: 4.2 (ref 3.4–5.3)
Sodium: 136 — AB (ref 137–147)

## 2020-08-01 LAB — CBC: RBC: 3.89 (ref 3.87–5.11)

## 2020-08-01 NOTE — Telephone Encounter (Signed)
Per 5/10 LOS, patient scheduled for 5/24 Labs, Follow Up - 5/26 Infusion.  Gave patient Appt Calendar

## 2020-08-01 NOTE — Progress Notes (Signed)
Gabrielle Hudson  [address] [place],  [place]  [address] [phone]  Clinic Day:  08/01/2020  Referring physician: Jeanie Sewer, NP   CHIEF COMPLAINT:  CC:  Metastatic small cell lung cancer to bone  Current Treatment:    Palliative pembrolizumab every 2 weeks with monthly zoledronic acid, which has been on hold   HISTORY OF PRESENT ILLNESS:  Gabrielle Hudson is a 80 y.o. female with a history of stage IIB non-small-cell lung cancer of the right upper lobe diagnosed in November 2010. Gabrielle Hudson had a large endobronchial component causing obstruction, so underwent laser therapy at [facility] with a good response. Due to severe emphysema and the location of the tumor being within 2 cm of the carina, Gabrielle Hudson was not a surgical candidate. Gabrielle Hudson was treated with concurrent radiation and chemotherapy with carboplatin and paclitaxel, followed by an additional 2 months of carboplatin and paclitaxel with a good response. Gabrielle Hudson had bilateral pneumonia in December 2012. CT scan at that time revealed some abnormality concerning for recurrence, so a PET scan was obtained in January 2013. There was mild hypermetabolic activity in the right hilum, but the patient opted for observation. Repeat CT scans had been stable, with scarring of the posterior right upper lobe. Gabrielle Hudson also has a history of a stage I adenocarcinoma of the colon, arising from a tubulovillous adenoma. Her colonoscopy in August 2012 had removal of multiple benign polyps. Her last mammogram was in December 2017. Gabrielle Hudson had her Port-A-Cath removed in May 2018. Gabrielle Hudson quit smoking about 11 years ago. CT chest in December 2018 revealed progression of amorphous soft tissue attenuation of the right hilum. There was no definite lymphadenopathy. There were stable scattered tiny pulmonary nodules all less than 5 mm. There is evidence of emphysema. There is a stable right liver lesion measuring 2.5  cm which was unchanged for over 5 years. CT chest in March of 2019 and September of 2019 were stable, with soft tissue attenuation is in the area of previous radiation. CT chest in October of 2020 revealed all previously noted pulmonary nodules measuring 4 mm or less in size are stable compared to the prior examination, and are considered definitively benign. Gabrielle Hudson also underwent bilateral diagnostic mammogram and right breast ultrasound which revealed the likely benign mass in the right breast at 2:30 is stable. CEA had decreased from 4.6 to 4.4.   Gabrielle Hudson was referred back from [facility] in October 2021 with a diagnosis of a new small cell lung cancer. Gabrielle Hudson presented with hemoptysis and pain of the left lower thorax. CT revealed tumor encasing and narrowing the left lower lobe pulmonary artery and the left lower lobe bronchus. There is a mass of the left hilum measuring 5.2 cm with left lower lobe atelectasis. Gabrielle Hudson also has moderate emphysema and mild cardiomegaly, with evidence of coronary artery disease. Biopsy revealed small cell carcinoma with crush artifact. Gabrielle Hudson had associated hyponatremia as low as 125, and required demeclocycline. Gabrielle Hudson was also placed on prednisone, 40 mg daily for 5 days, then 20 mg daily. Gabrielle Hudson complains of occasional cough productive of creamy colored sputum and occasional wheezing but no further hemoptysis. Gabrielle Hudson is using a lidoderm patch for her left sided chest pain with partial relief. Gabrielle Hudson had been hospitalized several times in August of this year for hyponatremia before her new lung cancer was diagnosed. In retrospect, the left hilar cancer was not obvious on chest x-ray. Gabrielle Hudson was found to be  iron deficient and her potassium was down to 3.0. Baseline CEA was 14.1. Gabrielle Hudson received 3 cycles of carboplatin/etoposide and has problems with hypomagnesemia Her CEA had increased in November to 48.3 from 14.1 in October, so we were concerned Gabrielle Hudson may not be responding.  After 3 cycles of carboplatin/etoposide, Gabrielle Hudson was hospitalized with severe weakness and diarrhea. Gabrielle Hudson was found to have severe neutropenia, which worsened during hospitalization to as low as 0.5 with an ANC of 80, despite Neulasta. Her white blood count finally improved to 1.5 with an [place] of 650. Her hemoglobin was down to 7 and her platelets steadily decreased from 140,000 at admission down to 17,000. Gabrielle Hudson denied bleeding, but did have severe bruising. Gabrielle Hudson received platelets and PRBC's during hospitalization. Her platelets were 50,000 on discharge. Gabrielle Hudson also had a UTI with Klebsiella pneumoniae, which was treated with IV ceftrixone. CT chest in the emergency department revealed extensive bilateral lower lobe segmental pulmonary emboli almost occlusive in the left lower lobe, so Gabrielle Hudson was placed on heparin. Gabrielle Hudson was discharged In January on apixaban 5 mg twice a day. We reviewed the previous imaging reports and there was good shrinkage of the tumor.  Her blood counts recovered, so we wanted to give a 4th cycle of chemotherapy with reduction in doses prior to placing her on observation.  We were concerned that Gabrielle Hudson may not be able to tolerate further chemotherapy. Gabrielle Hudson received a 4th cycle of carboplatin/etoposide beginning January 31st with a 25% dose reduction and tolerated it well. Her CEA was fairly stable at 42.5 inJanuary, and down to 22.5 in early February.  Repeat imaging in March, unfortunately, revealed new osseous metastasis.  There were radiation changes in the right lung, but no obvious progressive disease in the lung. Due to the bone metastasis, Gabrielle Hudson was placed on palliative immunotherapy with pembrolizumab every 2 weeks, as well as zoledronic acid every 4 weeks.  Gabrielle Hudson received her 1st cycle of pembrolizumab along with zoledronic acid on March 17th. Gabrielle Hudson developed severe hypocalcemia after zoledronic acid requiring IV and resuming oral supplementation.  We therefore planned to delay zoledronic  acid until May.  INTERVAL HISTORY:  Gabrielle Hudson is here today for repeat clinical assessment prior to nivolumab, as well as potentially zoledronic acid. Gabrielle Hudson reports chronic mild dyspnea with exertion which is stable.  Gabrielle Hudson denies cough or chest pain.  Gabrielle Hudson is using her oxygen regularly and continues to use a scooter to get around.  Gabrielle Hudson denies diarrhea.  Gabrielle Hudson denies skin rashes. Gabrielle Hudson denies fevers or chills. Gabrielle Hudson denies pain. Her appetite is good. Her weight has been stable. Gabrielle Hudson states Gabrielle Hudson continues calcium regularly.  Gabrielle Hudson requests a refill of apixaban and diltiazem.  Gabrielle Hudson is on apixaban for pulmonary embolism, so I will refill that. I asked her to contact her primary care provider regarding the diltiazem.  REVIEW OF SYSTEMS:  Review of Systems  Constitutional: Negative for appetite change, chills, fatigue, fever and unexpected weight change.  HENT:   Negative for lump/mass, mouth sores and sore throat.   Respiratory: Positive for shortness of breath (Chronic, stable dyspnea with exertion). Negative for cough.   Cardiovascular: Negative for chest pain and leg swelling.  Gastrointestinal: Negative for abdominal pain, constipation, diarrhea, nausea and vomiting.  Endocrine: Negative for hot flashes.  Genitourinary: Negative for difficulty urinating, dysuria, frequency and hematuria.   Musculoskeletal: Negative for arthralgias, back pain and myalgias.  Skin: Negative for rash.  Neurological: Negative for dizziness and headaches.  Hematological: Negative for adenopathy. Does not bruise/bleed  easily.  Psychiatric/Behavioral: Negative for depression and sleep disturbance. The patient is not nervous/anxious.      VITALS:  Blood pressure (!) 180/80, pulse 83, temperature 98.1 F (36.7 C), temperature source Oral, resp. rate 20, height 5\' 3"  (1.6 m), weight 225 lb 8 oz (102.3 kg), SpO2 99 %.  Wt Readings from Last 3 Encounters:  08/01/20 225 lb 8 oz (102.3 kg)  07/20/20 227 lb 4 oz (103.1 kg)  07/18/20 224 lb  12.8 oz (102 kg)    Body mass index is 39.95 kg/m.  Performance status (ECOG): 2 - Symptomatic, <50% confined to bed  PHYSICAL EXAM:  Physical Exam Vitals and nursing note reviewed.  Constitutional:      General: Gabrielle Hudson is not in acute distress.    Appearance: Normal appearance.  HENT:     Head: Normocephalic and atraumatic.     Mouth/Throat:     Mouth: Mucous membranes are moist.     Pharynx: Oropharynx is clear. No oropharyngeal exudate or posterior oropharyngeal erythema.  Eyes:     General: No scleral icterus.    Extraocular Movements: Extraocular movements intact.     Conjunctiva/sclera: Conjunctivae normal.     Pupils: Pupils are equal, round, and reactive to light.  Cardiovascular:     Rate and Rhythm: Normal rate and regular rhythm.     Heart sounds: Normal heart sounds. No murmur heard. No friction rub. No gallop.   Pulmonary:     Effort: Pulmonary effort is normal.     Breath sounds: Normal breath sounds. No wheezing, rhonchi or rales.  Chest:  Breasts:     Right: No axillary adenopathy or supraclavicular adenopathy.     Left: No axillary adenopathy or supraclavicular adenopathy.    Abdominal:     General: There is no distension.     Palpations: Abdomen is soft. There is no hepatomegaly, splenomegaly or mass.     Tenderness: There is no abdominal tenderness.  Musculoskeletal:        General: Normal range of motion.     Cervical back: Normal range of motion and neck supple. No tenderness.     Right lower leg: No edema.     Left lower leg: No edema.  Lymphadenopathy:     Cervical: No cervical adenopathy.     Upper Body:     Right upper body: No supraclavicular or axillary adenopathy.     Left upper body: No supraclavicular or axillary adenopathy.     Lower Body: No right inguinal adenopathy. No left inguinal adenopathy.  Skin:    General: Skin is warm and dry.     Coloration: Skin is not jaundiced.     Findings: No rash.  Neurological:     Mental Status:  Gabrielle Hudson is alert and oriented to person, place, and time.     Cranial Nerves: No cranial nerve deficit.  Psychiatric:        Mood and Affect: Mood normal.        Behavior: Behavior normal.        Thought Content: Thought content normal.    LABS:   CBC Latest Ref Rng & Units 08/01/2020 07/18/2020 07/04/2020  WBC - 5.6 5.7 5.4  Hemoglobin 12.0 - 16.0 11.8(A) 12.3 11.9(A)  Hematocrit 36 - 46 36 37 36  Platelets 150 - 399 204 227 215   CMP Latest Ref Rng & Units 08/01/2020 07/18/2020 07/04/2020  Glucose 70 - 99 mg/dL - - -  BUN 4 - 21 14 14  15  Creatinine 0.5 - 1.1 0.8 0.8 0.9  Sodium 137 - 147 136(A) 126(A) 135(A)  Potassium 3.4 - 5.3 4.2 4.3 3.9  Chloride 99 - 108 99 91(A) 99  CO2 13 - 22 32(A) 27(A) 27(A)  Calcium 8.7 - 10.7 8.8 8.3(A) 8.4(A)  Total Protein 6.5 - 8.1 g/dL - - -  Total Bilirubin 0.3 - 1.2 mg/dL - - -  Alkaline Phos 25 - 125 90 101 89  AST 13 - 35 17 22 18   ALT 7 - 35 11 12 12      Lab Results  Component Value Date   CEA1 18.3 (H) 05/15/2020   /  CEA  Date Value Ref Range Status  05/15/2020 18.3 (H) 0.0 - 4.7 ng/mL Final    Comment:    (NOTE)                             Nonsmokers          <3.9                             Smokers             <5.6 [employer] Electrochemiluminescence Immunoassay (ECLIA) Values obtained with different assay methods or kits cannot be used interchangeably.  Results cannot be interpreted as absolute evidence of the presence or absence of malignant disease. Performed At: [facility] [place], [place] [id number] Rush Farmer MD GQ:[id number]    No results found for: PSA1 No results found for: CAN199 No results found for: EKC003  Lab Results  Component Value Date   TOTALPROTELP 4.7 (L) 03/26/2011   ALBUMINELP 52.5 (L) 03/26/2011   A1GS 8.7 (H) 03/26/2011   A2GS 18.8 (H) 03/26/2011   BETS 4.5 (L) 03/26/2011   BETA2SER 4.7 03/26/2011   GAMS 10.8 (L) 03/26/2011   MSPIKE NOT DETECTED 03/26/2011    SPEI (NOTE) 03/26/2011   Lab Results  Component Value Date   TIBC 226 04/19/2020   TIBC 219 (L) 03/29/2011   FERRITIN 361 04/19/2020   FERRITIN 356 (H) 03/29/2011   IRONPCTSAT 44.2 04/19/2020   IRONPCTSAT 65 (H) 03/29/2011   Lab Results  Component Value Date   LDH 349 (H) 03/26/2011    STUDIES:  No results found.    HISTORY:   Past Medical History:  Diagnosis Date  . Anemia, unspecified   . Anemia, unspecified   . Cancer ([place]) 01/2009   SCC of the lung  . COPD (chronic obstructive pulmonary disease) (HCC) 2-3 LPM  . Diabetes mellitus newly diagnosed  . GERD (gastroesophageal reflux disease)   . Hyposmolality syndrome   . Hyposmolality syndrome   . Hypothyroidism   . Malignant neoplasm of overlapping sites of right lung ([place])   . Malignant neoplasm of overlapping sites of right lung ([place])   . Malignant neoplasm of sigmoid colon ([place])   . Malignant neoplasm of sigmoid colon ([place])   . Shortness of breath   . Sleep apnea     Past Surgical History:  Procedure Laterality Date  . COLON SURGERY    . PORTACATH PLACEMENT  2010    Family History  Problem Relation Age of Onset  . Colon cancer Father   . Prostate cancer Father     Social History:  reports that Gabrielle Hudson quit smoking about 12 years ago. Her smoking use included cigarettes. Gabrielle Hudson has never used smokeless tobacco.  Gabrielle Hudson reports that Gabrielle Hudson does not drink alcohol and does not use drugs.The patient is alone today.  Allergies:  Allergies  Allergen Reactions  . Nitroglycerin In D5w Other (See Comments)    "flatlines"  . Nitroglycerin Other (See Comments)    Drops her BP 'flatlines'     Current Medications: Current Outpatient Medications  Medication Sig Dispense Refill  . albuterol (PROVENTIL) (5 MG/ML) 0.5% nebulizer solution Take 2.5 mg by nebulization daily.    Marland Kitchen apixaban (ELIQUIS) 5 MG TABS tablet Take 1 tablet (5 mg total) by mouth 2 (two) times daily. 60 tablet 1  . budesonide-formoterol (SYMBICORT)  160-4.5 MCG/ACT inhaler Inhale 2 puffs into the lungs 2 (two) times daily.      . calcium citrate-vitamin D (CITRACAL+D) 315-200 MG-UNIT tablet Take 1 tablet by mouth 2 (two) times daily.    . Cholecalciferol (VITAMIN D3) 1.25 MG (50000 UT) CAPS     . Cholecalciferol 25 MCG (1000 UT) tablet Take by mouth.    . cyclobenzaprine (FLEXERIL) 10 MG tablet Take by mouth.    . diltiazem (CARDIZEM CD) 120 MG 24 hr capsule Take 120 mg by mouth daily.    . EUTHYROX 88 MCG tablet Take 88 mcg by mouth daily.    . famotidine (PEPCID) 20 MG tablet Take 20 mg by mouth 2 (two) times daily.    . fluticasone (FLONASE) 50 MCG/ACT nasal spray Place into both nostrils.    . furosemide (LASIX) 20 MG tablet Take 20 mg by mouth as needed. ONLY TAKES IT HAVING SWELLING    . gabapentin (NEURONTIN) 300 MG capsule Take 300 mg by mouth as needed.    Marland Kitchen guaiFENesin (MUCINEX) 600 MG 12 hr tablet Take 1 tablet by mouth every 12 (twelve) hours.    Marland Kitchen HYDROcodone-acetaminophen (NORCO) 7.5-325 MG tablet Take 1 tablet by mouth every 4 (four) hours as needed for moderate pain. 120 tablet 0  . magnesium oxide (MAG-OX) 400 (241.3 Mg) MG tablet Take 1 tablet (400 mg total) by mouth daily. 30 tablet 5  . meclizine (ANTIVERT) 25 MG tablet Take 0.5-1 tablets (12.5-25 mg total) by mouth 3 (three) times daily as needed for dizziness. 30 tablet 0  . ondansetron (ZOFRAN) 4 MG tablet Take 1 tablet (4 mg total) by mouth every 4 (four) hours as needed for nausea. 90 tablet 3  . OXYGEN Inhale 4 L into the lungs continuous.    . pantoprazole (PROTONIX) 20 MG tablet Take by mouth.    . Potassium Chloride ER 20 MEQ TBCR Take 1 tablet by mouth 2 (two) times daily.    . prochlorperazine (COMPAZINE) 10 MG tablet Take 1 tablet (10 mg total) by mouth every 6 (six) hours as needed for nausea or vomiting. 90 tablet 3  . sodium chloride 1 g tablet Take by mouth.    . Vitamin D, Ergocalciferol, (DRISDOL) 1.25 MG (50000 UNIT) CAPS capsule Take 50,000 Units by  mouth once a week.     No current facility-administered medications for this visit.     ASSESSMENT & PLAN:   Assessment:   1. History of stage IIB non-small cell lung cancer in November 2010. Gabrielle Hudson remains without evidence of recurrent or metastatic disease.  2. History of stage I colon cancer.   3. Severe oxygen dependant COPD.  4. Small cell carcinoma of the lung, at least a clinical stage IIIB, diagnosed in October 2021. CT imaging initially revealed a good response to chemotherapy with carboplatin/etoposide. However, Gabrielle Hudson developed new bone metastasis  with sclerotic lesions in the L2 and L4 vertebral bodies, left sacrum and left ischium, as well as mild changes of avascular necrosis in both femoral heads.  Gabrielle Hudson is now receiving palliative immunotherapy with nivolumab, Gabrielle Hudson is due for nivolumab this week.  5. 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 Gabrielle Hudson was placed on zoledronic acid in March. As Gabrielle Hudson had significant drop in her calcium, this was held in April.   6. Hypocalcemia, felt to be likely due to zoledronic acid, improved with IV and oral calcium supplement. Her calcium is normal today for the 1st time since receiving zoledronic acid in March, so I will continue to hold zoledronic acid until her next visit.Marland Kitchen   7. Chronic hyponatremia, which has improved.   Plan:    Overall, Gabrielle Hudson is doing relatively well. We will proceed with nivolumab this week.  I will hold zoledronic acid an additional 2 weeks and if her calcium remains in normal range, plan to proceed with zoledronic acid at that time.  I will plan to see her back in 2 weeks with a CBC and comprehensive metabolic panel prior to a 6th cycle of nivolumab after which we will likely repeat CT imaging. The patient understands the plans discussed today and is in agreement with them.  Gabrielle Hudson knows to contact our office if Gabrielle Hudson  develops concerns prior to her next appointment.      [name], PA-C

## 2020-08-02 ENCOUNTER — Other Ambulatory Visit: Payer: Self-pay | Admitting: Pharmacist

## 2020-08-02 LAB — CEA: CEA: 30 ng/mL — ABNORMAL HIGH (ref 0.0–4.7)

## 2020-08-02 LAB — T4: T4, Total: 8.7 ug/dL (ref 4.5–12.0)

## 2020-08-03 ENCOUNTER — Inpatient Hospital Stay: Payer: Medicare Other

## 2020-08-03 ENCOUNTER — Other Ambulatory Visit: Payer: Self-pay

## 2020-08-03 ENCOUNTER — Ambulatory Visit: Payer: Medicare Other

## 2020-08-03 VITALS — BP 153/63 | HR 81 | Resp 20 | Ht 63.0 in | Wt 228.0 lb

## 2020-08-03 DIAGNOSIS — E871 Hypo-osmolality and hyponatremia: Secondary | ICD-10-CM

## 2020-08-03 DIAGNOSIS — D241 Benign neoplasm of right breast: Secondary | ICD-10-CM | POA: Diagnosis not present

## 2020-08-03 DIAGNOSIS — C3411 Malignant neoplasm of upper lobe, right bronchus or lung: Secondary | ICD-10-CM | POA: Diagnosis not present

## 2020-08-03 DIAGNOSIS — Z5112 Encounter for antineoplastic immunotherapy: Secondary | ICD-10-CM | POA: Diagnosis not present

## 2020-08-03 DIAGNOSIS — C7951 Secondary malignant neoplasm of bone: Secondary | ICD-10-CM | POA: Diagnosis not present

## 2020-08-03 DIAGNOSIS — Z7901 Long term (current) use of anticoagulants: Secondary | ICD-10-CM | POA: Diagnosis not present

## 2020-08-03 DIAGNOSIS — E039 Hypothyroidism, unspecified: Secondary | ICD-10-CM | POA: Diagnosis not present

## 2020-08-03 DIAGNOSIS — J449 Chronic obstructive pulmonary disease, unspecified: Secondary | ICD-10-CM | POA: Diagnosis not present

## 2020-08-03 DIAGNOSIS — E222 Syndrome of inappropriate secretion of antidiuretic hormone: Secondary | ICD-10-CM

## 2020-08-03 DIAGNOSIS — Z85038 Personal history of other malignant neoplasm of large intestine: Secondary | ICD-10-CM | POA: Diagnosis not present

## 2020-08-03 DIAGNOSIS — Z9981 Dependence on supplemental oxygen: Secondary | ICD-10-CM | POA: Diagnosis not present

## 2020-08-03 DIAGNOSIS — C3432 Malignant neoplasm of lower lobe, left bronchus or lung: Secondary | ICD-10-CM

## 2020-08-03 DIAGNOSIS — Z79899 Other long term (current) drug therapy: Secondary | ICD-10-CM | POA: Diagnosis not present

## 2020-08-03 DIAGNOSIS — Z86711 Personal history of pulmonary embolism: Secondary | ICD-10-CM | POA: Diagnosis not present

## 2020-08-03 MED ORDER — SODIUM CHLORIDE 0.9 % IV SOLN
Freq: Once | INTRAVENOUS | Status: AC
  Filled 2020-08-03: qty 250

## 2020-08-03 MED ORDER — HEPARIN SOD (PORK) LOCK FLUSH 100 UNIT/ML IV SOLN
500.0000 [IU] | Freq: Once | INTRAVENOUS | Status: AC | PRN
  Administered 2020-08-03: 500 [IU]
  Filled 2020-08-03: qty 5

## 2020-08-03 MED ORDER — SODIUM CHLORIDE 0.9 % IV SOLN
240.0000 mg | Freq: Once | INTRAVENOUS | Status: AC
  Administered 2020-08-03: 240 mg via INTRAVENOUS
  Filled 2020-08-03: qty 24

## 2020-08-03 NOTE — Progress Notes (Signed)
1119: PT STABLE AT TIME OF DISCHARGE

## 2020-08-08 NOTE — Progress Notes (Signed)
Sent in request for DOS 08/15/2020 and 08/17/2020, to [employer].

## 2020-08-10 ENCOUNTER — Other Ambulatory Visit: Payer: Self-pay | Admitting: Hematology and Oncology

## 2020-08-10 DIAGNOSIS — I2782 Chronic pulmonary embolism: Secondary | ICD-10-CM

## 2020-08-11 ENCOUNTER — Other Ambulatory Visit: Payer: Self-pay | Admitting: Hematology and Oncology

## 2020-08-11 DIAGNOSIS — C3432 Malignant neoplasm of lower lobe, left bronchus or lung: Secondary | ICD-10-CM

## 2020-08-15 ENCOUNTER — Encounter: Payer: Self-pay | Admitting: Hematology

## 2020-08-15 ENCOUNTER — Inpatient Hospital Stay: Payer: Medicare Other

## 2020-08-15 ENCOUNTER — Inpatient Hospital Stay (INDEPENDENT_AMBULATORY_CARE_PROVIDER_SITE_OTHER): Payer: Medicare Other | Admitting: Hematology

## 2020-08-15 ENCOUNTER — Other Ambulatory Visit: Payer: Self-pay

## 2020-08-15 ENCOUNTER — Encounter: Payer: Self-pay | Admitting: Hematology and Oncology

## 2020-08-15 VITALS — BP 182/74 | HR 88 | Temp 98.2°F | Resp 20 | Ht 63.0 in | Wt 224.2 lb

## 2020-08-15 DIAGNOSIS — Z5112 Encounter for antineoplastic immunotherapy: Secondary | ICD-10-CM | POA: Diagnosis not present

## 2020-08-15 DIAGNOSIS — C7951 Secondary malignant neoplasm of bone: Secondary | ICD-10-CM | POA: Diagnosis not present

## 2020-08-15 DIAGNOSIS — E871 Hypo-osmolality and hyponatremia: Secondary | ICD-10-CM | POA: Diagnosis not present

## 2020-08-15 DIAGNOSIS — E222 Syndrome of inappropriate secretion of antidiuretic hormone: Secondary | ICD-10-CM

## 2020-08-15 DIAGNOSIS — Z7901 Long term (current) use of anticoagulants: Secondary | ICD-10-CM | POA: Diagnosis not present

## 2020-08-15 DIAGNOSIS — Z79899 Other long term (current) drug therapy: Secondary | ICD-10-CM | POA: Diagnosis not present

## 2020-08-15 DIAGNOSIS — C3432 Malignant neoplasm of lower lobe, left bronchus or lung: Secondary | ICD-10-CM

## 2020-08-15 DIAGNOSIS — C7952 Secondary malignant neoplasm of bone marrow: Secondary | ICD-10-CM | POA: Diagnosis not present

## 2020-08-15 DIAGNOSIS — J449 Chronic obstructive pulmonary disease, unspecified: Secondary | ICD-10-CM | POA: Diagnosis not present

## 2020-08-15 DIAGNOSIS — Z85038 Personal history of other malignant neoplasm of large intestine: Secondary | ICD-10-CM | POA: Diagnosis not present

## 2020-08-15 DIAGNOSIS — C3411 Malignant neoplasm of upper lobe, right bronchus or lung: Secondary | ICD-10-CM | POA: Diagnosis not present

## 2020-08-15 DIAGNOSIS — D649 Anemia, unspecified: Secondary | ICD-10-CM | POA: Diagnosis not present

## 2020-08-15 DIAGNOSIS — Z9981 Dependence on supplemental oxygen: Secondary | ICD-10-CM | POA: Diagnosis not present

## 2020-08-15 DIAGNOSIS — E039 Hypothyroidism, unspecified: Secondary | ICD-10-CM | POA: Diagnosis not present

## 2020-08-15 DIAGNOSIS — D241 Benign neoplasm of right breast: Secondary | ICD-10-CM | POA: Diagnosis not present

## 2020-08-15 DIAGNOSIS — Z86711 Personal history of pulmonary embolism: Secondary | ICD-10-CM | POA: Diagnosis not present

## 2020-08-15 LAB — HEPATIC FUNCTION PANEL
ALT: 13 (ref 7–35)
AST: 19 (ref 13–35)
Alkaline Phosphatase: 80 (ref 25–125)
Bilirubin, Total: 0.3

## 2020-08-15 LAB — CBC AND DIFFERENTIAL
HCT: 37 (ref 36–46)
Hemoglobin: 12 (ref 12.0–16.0)
Neutrophils Absolute: 3.97
Platelets: 228 (ref 150–399)
WBC: 6.4

## 2020-08-15 LAB — BASIC METABOLIC PANEL
BUN: 18 (ref 4–21)
CO2: 32 — AB (ref 13–22)
Chloride: 99 (ref 99–108)
Creatinine: 0.9 (ref 0.5–1.1)
Glucose: 110
Potassium: 4.4 (ref 3.4–5.3)
Sodium: 137 (ref 137–147)

## 2020-08-15 LAB — TSH: TSH: 4.88 u[IU]/mL — ABNORMAL HIGH (ref 0.350–4.500)

## 2020-08-15 LAB — COMPREHENSIVE METABOLIC PANEL
Albumin: 4.2 (ref 3.5–5.0)
Calcium: 9.5 (ref 8.7–10.7)

## 2020-08-15 LAB — CBC: RBC: 4.1 (ref 3.87–5.11)

## 2020-08-15 MED ORDER — SODIUM CHLORIDE 0.9% FLUSH
10.0000 mL | Freq: Once | INTRAVENOUS | Status: AC | PRN
  Administered 2020-08-15: 10 mL
  Filled 2020-08-15: qty 10

## 2020-08-15 MED ORDER — HEPARIN SOD (PORK) LOCK FLUSH 100 UNIT/ML IV SOLN
250.0000 [IU] | Freq: Once | INTRAVENOUS | Status: AC | PRN
  Administered 2020-08-15: 500 [IU]
  Filled 2020-08-15: qty 5

## 2020-08-15 NOTE — Progress Notes (Signed)
[facility]   [phone] [phone]) [phone]   Clinic Follow up Note   Patient Care Team: Jeanie Sewer, NP as PCP - General (Family Medicine) Gardiner Rhyme, MD as Attending Physician (Pulmonary Disease) Governor Rooks., DO as Attending Physician (General Surgery) 08/15/2020  CHIEF COMPLAINT: f/u small cell lung cancer, on Nivolumab   SUMMARY OF ONCOLOGIC HISTORY: Oncology History  Small cell carcinoma of lower lobe of left lung ([place])  01/20/2020 Initial Diagnosis   Lung cancer, lower lobe ([place])   01/20/2020 Cancer Staging   Staging form: Lung, AJCC 8th Edition - Clinical: Stage IIIB (cT3, cN2, cM0) - Signed by Derwood Kaplan, MD on 01/20/2020   01/21/2020 Cancer Staging   Staging form: Lung, AJCC 8th Edition - Pathologic stage from 01/21/2020: Stage IIIB (pT3, pN2, cM0) - Signed by Derwood Kaplan, MD on 01/21/2020   01/31/2020 - 04/28/2020 Chemotherapy         06/08/2020 -  Chemotherapy    Patient is on Treatment Plan: LUNG NIVOLUMAB Q14D      Secondary malignant neoplasm of bone and bone marrow ([place])    CURRENT THERAPY: Nivolumab every 2 weeks  INTERVAL HISTORY:  Ms Starry returns for follow-up before her nivolumab treatment.  She came in a automatic wheelchair with nasal cannula oxygen. She is stable clinically, stil on 4L oxygen continuously which is unchanged.  She does not walk much due to her dyspnea and arthralgia.  She lives independently, able to function at home, her daughter helps out at home.  She denies any significant pain, or abdominal discomfort.  She has good appetite and eats well.  Weight is stable.  No fever or chills.  She has been tolerating nivolumab without noticeable side effects.  REVIEW OF SYSTEMS:   Constitutional: Denies fevers, chills or abnormal weight loss Eyes: Denies blurriness of vision Ears, nose, mouth, throat, and face: Denies mucositis or sore throat Respiratory: (+) Chronic dyspnea  on exertion, unchanged. Cardiovascular: Denies palpitation, chest discomfort or lower extremity swelling Gastrointestinal:  Denies nausea, heartburn or change in bowel habits Skin: Denies abnormal skin rashes Lymphatics: Denies new lymphadenopathy or easy bruising Neurological:Denies numbness, tingling or new weaknesses Behavioral/Psych: Mood is stable, no new changes  All other systems were reviewed with the patient and are negative.  MEDICAL HISTORY:  Past Medical History:  Diagnosis Date  . Anemia, unspecified   . Anemia, unspecified   . Cancer ([place]) 01/2009   SCC of the lung  . COPD (chronic obstructive pulmonary disease) (H[facility]) 2-3 LPM  . GERD (gastroesophageal reflux disease)   . Hyposmolality syndrome   . Hyposmolality syndrome   . Hypothyroidism   . Malignant neoplasm of overlapping sites of right lung ([place])   . Malignant neoplasm of overlapping sites of right lung ([place])   . Malignant neoplasm of sigmoid colon ([place])   . Malignant neoplasm of sigmoid colon ([place])   . Shortness of breath   . Sleep apnea     SURGICAL HISTORY: Past Surgical History:  Procedure Laterality Date  . COLON SURGERY    . PORTACATH PLACEMENT  2010    I have reviewed the social history and family history with the patient and they are unchanged from previous note.  ALLERGIES:  is allergic to nitroglycerin in d5w and nitroglycerin.  MEDICATIONS:  Current Outpatient Medications  Medication Sig Dispense Refill  . albuterol (PROVENTIL) (5 MG/ML) 0.5% nebulizer solution Take 2.5 mg by nebulization daily.    . budesonide-formoterol (SYMBICORT) 160-4.5 MCG/ACT  inhaler Inhale 2 puffs into the lungs 2 (two) times daily.      . calcium citrate-vitamin D (CITRACAL+D) 315-200 MG-UNIT tablet Take 1 tablet by mouth 2 (two) times daily.    . Cholecalciferol (VITAMIN D3) 1.25 MG (50000 UT) CAPS     . Cholecalciferol 25 MCG (1000 UT) tablet Take by mouth.    . cyclobenzaprine (FLEXERIL) 10 MG tablet Take by  mouth.    . diltiazem (CARDIZEM CD) 120 MG 24 hr capsule Take 120 mg by mouth daily.    Marland Kitchen ELIQUIS 5 MG TABS tablet Take 1 tablet by mouth twice daily 60 tablet 2  . EUTHYROX 88 MCG tablet Take 88 mcg by mouth daily.    . famotidine (PEPCID) 20 MG tablet Take 20 mg by mouth 2 (two) times daily.    . fluticasone (FLONASE) 50 MCG/ACT nasal spray Place into both nostrils.    . furosemide (LASIX) 20 MG tablet Take 20 mg by mouth as needed. ONLY TAKES IT HAVING SWELLING    . gabapentin (NEURONTIN) 300 MG capsule Take 300 mg by mouth as needed.    Marland Kitchen guaiFENesin (MUCINEX) 600 MG 12 hr tablet Take 1 tablet by mouth every 12 (twelve) hours.    . magnesium oxide (MAG-OX) 400 (241.3 Mg) MG tablet Take 1 tablet (400 mg total) by mouth daily. 30 tablet 5  . meclizine (ANTIVERT) 25 MG tablet Take 0.5-1 tablets (12.5-25 mg total) by mouth 3 (three) times daily as needed for dizziness. 30 tablet 0  . ondansetron (ZOFRAN) 4 MG tablet Take 1 tablet (4 mg total) by mouth every 4 (four) hours as needed for nausea. 90 tablet 3  . OXYGEN Inhale 4 L into the lungs continuous.    . pantoprazole (PROTONIX) 20 MG tablet Take by mouth.    . Potassium Chloride ER 20 MEQ TBCR Take 1 tablet by mouth 2 (two) times daily.    . prochlorperazine (COMPAZINE) 10 MG tablet Take 1 tablet (10 mg total) by mouth every 6 (six) hours as needed for nausea or vomiting. 90 tablet 3  . sodium chloride 1 g tablet Take by mouth.    . Vitamin D, Ergocalciferol, (DRISDOL) 1.25 MG (50000 UNIT) CAPS capsule Take 50,000 Units by mouth once a week.     No current facility-administered medications for this visit.    PHYSICAL EXAMINATION: ECOG PERFORMANCE STATUS: 3 - Symptomatic, >50% confined to bed  Vitals:   08/15/20 1300  BP: (!) 182/74  Pulse: 88  Resp: 20  Temp: 98.2 F (36.8 C)  SpO2: 99%   Filed Weights   08/15/20 1300  Weight: 224 lb 4 oz (101.7 kg)    GENERAL:alert, no distress and comfortable SKIN: skin color, texture,  turgor are normal, no rashes or significant lesions EYES: normal, Conjunctiva are pink and non-injected, sclera clear OROPHARYNX:no exudate, no erythema and lips, buccal mucosa, and tongue normal  NECK: supple, thyroid normal size, non-tender, without nodularity LYMPH:  no palpable lymphadenopathy in the cervical, axillary or inguinal LUNGS: clear to auscultation and percussion with normal breathing effort HEART: regular rate & rhythm and no murmurs and no lower extremity edema ABDOMEN:abdomen soft, non-tender and normal bowel sounds Musculoskeletal:no cyanosis of digits and no clubbing  NEURO: alert & oriented x 3 with fluent speech, no focal motor/sensory deficits  LABORATORY DATA:  I have reviewed the data as listed CBC Latest Ref Rng & Units 08/15/2020 08/01/2020 07/18/2020  WBC - 6.4 5.6 5.7  Hemoglobin 12.0 - 16.0 12.0  11.8(A) 12.3  Hematocrit 36 - 46 37 36 37  Platelets 150 - 399 228 204 227     CMP Latest Ref Rng & Units 08/15/2020 08/01/2020 07/18/2020  Glucose 70 - 99 mg/dL - - -  BUN 4 - 21 18 14 14   Creatinine 0.5 - 1.1 0.9 0.8 0.8  Sodium 137 - 147 137 136(A) 126(A)  Potassium 3.4 - 5.3 4.4 4.2 4.3  Chloride 99 - 108 99 99 91(A)  CO2 13 - 22 32(A) 32(A) 27(A)  Calcium 8.7 - 10.7 9.5 8.8 8.3(A)  Total Protein 6.5 - 8.1 g/dL - - -  Total Bilirubin 0.3 - 1.2 mg/dL - - -  Alkaline Phos 25 - 125 80 90 101  AST 13 - 35 19 17 22   ALT 7 - 35 13 11 12       RADIOGRAPHIC STUDIES: I have personally reviewed the radiological images as listed and agreed with the findings in the report. No results found.   ASSESSMENT & PLAN:  80 year old Caucasian female with past medical history of COPD on home oxygen  1.  Small cell lung cancer of left lung, with bone metastasis  -Initially stage IIIb, diagnosed in October 2021, status post chemo carboplatin and etoposide with excellent response.  She subsequently developed bone metastasis  -She started Nivo every 2 weeks in March 2022,  status post 5 cycles, with excellent tolerance, no noticeable side effects. -She is clinically stable, no pain or other concerns for disease progression clinically, exam was unremarkable -Today's lab was drawn here, results are still pending -Plan to proceed cycle 6 nivolumab in 2 days -She is due for restaging, I recommend PET scan however she is reluctant due to the long duration of scan.  We will repeat a CT scan.  Due to the national severe shortage of her CT IV contrast, will do CT chest, abdomen and pelvis without contrast next week -If scan shows no evidence of disease progression, will continue nivolumab, and may change to every 4 weeks.  2. Bone metastasis -Started Zometa on 06/08/2020, it has been held due to hypocalcemia  -continue very 3 months, next due in late June   3.  Severe COPD, oxygen dependent  4.  History of stage IIb non-small cell lung cancer in November 2020   5.  History of stage I colon cancer  PLan -lab results pending, if adequate, will proceed to cycle 6 Nivolumab in 2 days -CT chest, abdomen and pelvis without contrast (OK with oral contrast) next week -lab and f/u in 2 weeks   Orders Placed This Encounter  Procedures  . CT CHEST ABDOMEN PELVIS WO CONTRAST    Standing Status:   Future    Standing Expiration Date:   08/15/2021    Order Specific Question:   If indicated for the ordered procedure, I authorize the administration of contrast media per Radiology protocol    Answer:   Yes    Order Specific Question:   Preferred imaging location?    Answer:   External    Order Specific Question:   Release to patient    Answer:   Immediate    Order Specific Question:   Is Oral Contrast requested for this exam?    Answer:   Yes, Per Radiology protocol    Order Specific Question:   Reason for Exam (SYMPTOM  OR DIAGNOSIS REQUIRED)    Answer:   Evaluate response to chemo   All questions were answered. The patient knows to call  the clinic with any problems,  questions or concerns. No barriers to learning was detected. I spent 25 minutes counseling the patient face to face. The total time spent in the appointment was 30 minutes and more than 50% was on counseling and review of test results     Truitt Merle, MD 08/15/20

## 2020-08-17 ENCOUNTER — Inpatient Hospital Stay: Payer: Medicare Other

## 2020-08-17 ENCOUNTER — Other Ambulatory Visit: Payer: Self-pay

## 2020-08-17 VITALS — HR 88 | Temp 98.7°F | Resp 20 | Ht 63.0 in | Wt 228.8 lb

## 2020-08-17 DIAGNOSIS — C7951 Secondary malignant neoplasm of bone: Secondary | ICD-10-CM | POA: Diagnosis not present

## 2020-08-17 DIAGNOSIS — Z79899 Other long term (current) drug therapy: Secondary | ICD-10-CM | POA: Diagnosis not present

## 2020-08-17 DIAGNOSIS — J45998 Other asthma: Secondary | ICD-10-CM | POA: Diagnosis not present

## 2020-08-17 DIAGNOSIS — Z5112 Encounter for antineoplastic immunotherapy: Secondary | ICD-10-CM | POA: Diagnosis not present

## 2020-08-17 DIAGNOSIS — D241 Benign neoplasm of right breast: Secondary | ICD-10-CM | POA: Diagnosis not present

## 2020-08-17 DIAGNOSIS — E871 Hypo-osmolality and hyponatremia: Secondary | ICD-10-CM

## 2020-08-17 DIAGNOSIS — Z9981 Dependence on supplemental oxygen: Secondary | ICD-10-CM | POA: Diagnosis not present

## 2020-08-17 DIAGNOSIS — E222 Syndrome of inappropriate secretion of antidiuretic hormone: Secondary | ICD-10-CM

## 2020-08-17 DIAGNOSIS — E039 Hypothyroidism, unspecified: Secondary | ICD-10-CM | POA: Diagnosis not present

## 2020-08-17 DIAGNOSIS — Z85038 Personal history of other malignant neoplasm of large intestine: Secondary | ICD-10-CM | POA: Diagnosis not present

## 2020-08-17 DIAGNOSIS — Z7901 Long term (current) use of anticoagulants: Secondary | ICD-10-CM | POA: Diagnosis not present

## 2020-08-17 DIAGNOSIS — J449 Chronic obstructive pulmonary disease, unspecified: Secondary | ICD-10-CM | POA: Diagnosis not present

## 2020-08-17 DIAGNOSIS — C3432 Malignant neoplasm of lower lobe, left bronchus or lung: Secondary | ICD-10-CM

## 2020-08-17 DIAGNOSIS — Z86711 Personal history of pulmonary embolism: Secondary | ICD-10-CM | POA: Diagnosis not present

## 2020-08-17 DIAGNOSIS — C3411 Malignant neoplasm of upper lobe, right bronchus or lung: Secondary | ICD-10-CM | POA: Diagnosis not present

## 2020-08-17 LAB — T4: T4, Total: 8.6 ug/dL (ref 4.5–12.0)

## 2020-08-17 LAB — CEA: CEA: 28.7 ng/mL — ABNORMAL HIGH (ref 0.0–4.7)

## 2020-08-17 MED ORDER — SODIUM CHLORIDE 0.9 % IV SOLN
Freq: Once | INTRAVENOUS | Status: AC
  Filled 2020-08-17: qty 250

## 2020-08-17 MED ORDER — SODIUM CHLORIDE 0.9 % IV SOLN
240.0000 mg | Freq: Once | INTRAVENOUS | Status: AC
  Administered 2020-08-17: 240 mg via INTRAVENOUS
  Filled 2020-08-17: qty 24

## 2020-08-17 MED ORDER — HEPARIN SOD (PORK) LOCK FLUSH 100 UNIT/ML IV SOLN
500.0000 [IU] | Freq: Once | INTRAVENOUS | Status: AC | PRN
  Administered 2020-08-17: 500 [IU]
  Filled 2020-08-17: qty 5

## 2020-08-17 NOTE — Progress Notes (Signed)
1334: PT STABLE AT TIME OF DISCHARGE

## 2020-08-17 NOTE — Patient Instructions (Signed)
[facility]  Discharge Instructions: Thank you for choosing [facility] to provide your oncology and hematology care.  If you have a lab appointment with the [facility], please go directly to the [facility] and check in at the registration area.   Wear comfortable clothing and clothing appropriate for easy access to any Portacath or PICC line.   We strive to give you quality time with your provider. You may need to reschedule your appointment if you arrive late (15 or more minutes).  Arriving late affects you and other patients whose appointments are after yours.  Also, if you miss three or more appointments without notifying the office, you may be dismissed from the clinic at the provider's discretion.      For prescription refill requests, have your pharmacy contact our office and allow 72 hours for refills to be completed.    Today you received the following chemotherapy and/or immunotherapy agents Nivolumab    To help prevent nausea and vomiting after your treatment, we encourage you to take your nausea medication as directed.  BELOW ARE SYMPTOMS THAT SHOULD BE REPORTED IMMEDIATELY: . *FEVER GREATER THAN 100.4 F (38 C) OR HIGHER . *CHILLS OR SWEATING . *NAUSEA AND VOMITING THAT IS NOT CONTROLLED WITH YOUR NAUSEA MEDICATION . *UNUSUAL SHORTNESS OF BREATH . *UNUSUAL BRUISING OR BLEEDING . *URINARY PROBLEMS (pain or burning when urinating, or frequent urination) . *BOWEL PROBLEMS (unusual diarrhea, constipation, pain near the anus) . TENDERNESS IN MOUTH AND THROAT WITH OR WITHOUT PRESENCE OF ULCERS (sore throat, sores in mouth, or a toothache) . UNUSUAL RASH, SWELLING OR PAIN  . UNUSUAL VAGINAL DISCHARGE OR ITCHING   Items with * indicate a potential emergency and should be followed up as soon as possible or go to the Emergency Department if any problems should occur.  Please show the CHEMOTHERAPY ALERT CARD or IMMUNOTHERAPY ALERT CARD at  check-in to the Emergency Department and triage nurse.  Should you have questions after your visit or need to cancel or reschedule your appointment, please contact [facility]  Dept: [phone]  and follow the prompts.  Office hours are 8:00 a.m. to 4:30 p.m. Monday - Friday. Please note that voicemails left after 4:00 p.m. may not be returned until the following business day.  We are closed weekends and major holidays. You have access to a nurse at all times for urgent questions. Please call the main number to the clinic Dept: [phone] and follow the prompts.  For any non-urgent questions, you may also contact your provider using MyChart. We now offer e-Visits for anyone 45 and older to request care online for non-urgent symptoms. For details visit [email or web address].   Also download the MyChart app! Go to the app store, search "MyChart", open the app, select [facility], and log in with your MyChart username and password.  Due to Covid, a mask is required upon entering the hospital/clinic. If you do not have a mask, one will be given to you upon arrival. For doctor visits, patients may have 1 support person aged 85 or older with them. For treatment visits, patients cannot have anyone with them due to current Covid guidelines and our immunocompromised population.

## 2020-08-18 NOTE — Progress Notes (Signed)
Sent in request for DOS 06/06, 06/07, and 06/09 to [employer].

## 2020-08-23 ENCOUNTER — Other Ambulatory Visit: Payer: Self-pay | Admitting: Hematology and Oncology

## 2020-08-25 ENCOUNTER — Telehealth: Payer: Self-pay | Admitting: Hematology and Oncology

## 2020-08-25 NOTE — Telephone Encounter (Signed)
Per patient, she needs a later Appt Time on 6/7 - Rescheduled Labs 1:30 pm - Follow Up from Dr Burr Medico to [facility] 2:00 pm

## 2020-08-28 DIAGNOSIS — I2510 Atherosclerotic heart disease of native coronary artery without angina pectoris: Secondary | ICD-10-CM | POA: Diagnosis not present

## 2020-08-28 DIAGNOSIS — K769 Liver disease, unspecified: Secondary | ICD-10-CM | POA: Diagnosis not present

## 2020-08-28 DIAGNOSIS — C7951 Secondary malignant neoplasm of bone: Secondary | ICD-10-CM | POA: Diagnosis not present

## 2020-08-28 DIAGNOSIS — J941 Fibrothorax: Secondary | ICD-10-CM | POA: Diagnosis not present

## 2020-08-28 DIAGNOSIS — K5750 Diverticulosis of both small and large intestine without perforation or abscess without bleeding: Secondary | ICD-10-CM | POA: Diagnosis not present

## 2020-08-28 DIAGNOSIS — C3432 Malignant neoplasm of lower lobe, left bronchus or lung: Secondary | ICD-10-CM | POA: Diagnosis not present

## 2020-08-28 DIAGNOSIS — J439 Emphysema, unspecified: Secondary | ICD-10-CM | POA: Diagnosis not present

## 2020-08-28 DIAGNOSIS — C3492 Malignant neoplasm of unspecified part of left bronchus or lung: Secondary | ICD-10-CM | POA: Diagnosis not present

## 2020-08-29 ENCOUNTER — Inpatient Hospital Stay: Payer: Medicare Other

## 2020-08-29 ENCOUNTER — Telehealth: Payer: Self-pay | Admitting: Hematology and Oncology

## 2020-08-29 ENCOUNTER — Inpatient Hospital Stay: Payer: Medicare Other | Admitting: Hematology

## 2020-08-29 ENCOUNTER — Inpatient Hospital Stay: Payer: Medicare Other | Admitting: Hematology and Oncology

## 2020-08-29 DIAGNOSIS — J449 Chronic obstructive pulmonary disease, unspecified: Secondary | ICD-10-CM | POA: Diagnosis not present

## 2020-08-29 NOTE — Telephone Encounter (Signed)
Per Evelena Peat, rescheduled patient's 6/7 Labs, Follow Up to 6/8 w/Kelli.

## 2020-08-29 NOTE — Progress Notes (Deleted)
Gabrielle Hudson  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 female with a history of stage IIB non-small-cell lung cancer of the right upper lobe diagnosed in November 2010. She had a large endobronchial component causing obstruction, so underwent laser therapy at [facility] with a good response. Due to severe emphysema and the location of the tumor being within 2 cm of the carina, she was not a surgical candidate. She was treated with concurrent radiation and chemotherapy with carboplatin and paclitaxel, followed by an additional 2 months of carboplatin and paclitaxel with a good response. She had bilateral pneumonia in December 2012. CT scan at that time revealed some abnormality concerning for recurrence, so a PET scan was obtained in January 2013. There was mild hypermetabolic activity in the right hilum, but the patient opted for observation. Repeat CT scans had been stable, with scarring of the posterior right upper lobe. She also has a history of a stage I adenocarcinoma of the colon, arising from a tubulovillous adenoma. Her colonoscopy in August 2012 had removal of multiple benign polyps. Her last mammogram was in December 2017. She had her Port-A-Cath removed in May 2018. She quit smoking about 11 years ago. CT chest in December 2018 revealed progression of amorphous soft tissue attenuation of the right hilum. There was no definite lymphadenopathy. There were stable scattered tiny pulmonary nodules all less than 5 mm. There is evidence of emphysema. There is a stable right liver lesion measuring 2.5  cm which was unchanged for over 5 years. CT chest in March of 2019 and September of 2019 were stable, with soft tissue attenuation is in the area of previous radiation. CT chest in October of 2020 revealed all previously noted pulmonary nodules measuring 4 mm or less in size are stable compared to the prior examination, and are considered definitively benign. She also underwent bilateral diagnostic mammogram and right breast ultrasound which revealed the likely benign mass in the right breast at 2:30 is stable. CEA had decreased from 4.6 to 4.4.   She was referred back from [facility] in October 2021 with a diagnosis of a new small cell lung cancer. She presented with hemoptysis and pain of the left lower thorax. CT revealed tumor encasing and narrowing the left lower lobe pulmonary artery and the left lower lobe bronchus. There is a mass of the left hilum measuring 5.2 cm with left lower lobe atelectasis. She also has moderate emphysema and mild cardiomegaly, with evidence of coronary artery disease. Biopsy revealed small cell carcinoma with crush artifact. She had associated hyponatremia as low as 125, and required demeclocycline. She was also placed on prednisone, 40 mg daily for 5 days, then 20 mg daily. She complains of occasional cough productive of creamy colored sputum and occasional wheezing but no further hemoptysis. She is using a lidoderm patch for her left sided chest pain with partial relief. She had been hospitalized several times in August of this year for hyponatremia before her new lung cancer was diagnosed. In retrospect, the left hilar cancer was not obvious on chest x-ray. She was found to be  iron deficient and her potassium was down to 3.0. Baseline CEA was 14.1. She received 3 cycles of carboplatin/etoposide and has problems with hypomagnesemia Her CEA had increased in November to 48.3 from 14.1 in October, so we were concerned she may not be responding.  After 3 cycles of carboplatin/etoposide, she was hospitalized with severe weakness and diarrhea. She was found to have severe neutropenia, which worsened during hospitalization to as low as 0.5 with an ANC of 80, despite Neulasta. Her white blood count finally improved to 1.5 with an [place] of 650. Her hemoglobin was down to 7 and her platelets steadily decreased from 140,000 at admission down to 17,000. She denied bleeding, but did have severe bruising. She received platelets and PRBC's during hospitalization. Her platelets were 50,000 on discharge. She also had a UTI with Klebsiella pneumoniae, which was treated with IV ceftrixone. CT chest in the emergency department revealed extensive bilateral lower lobe segmental pulmonary emboli almost occlusive in the left lower lobe, so she was placed on heparin. She was discharged In January on apixaban 5 mg twice a day. We reviewed the previous imaging reports and there was good shrinkage of the tumor.  Her blood counts recovered, so we wanted to give a 4th cycle of chemotherapy with reduction in doses prior to placing her on observation.  We were concerned that she may not be able to tolerate further chemotherapy. She received a 4th cycle of carboplatin/etoposide beginning January 31st with a 25% dose reduction and tolerated it well. Her CEA was fairly stable at 42.5 inJanuary, and down to 22.5 in early February.  Repeat imaging in March, unfortunately, revealed new osseous metastasis.  There were radiation changes in the right lung, but no obvious progressive disease in the lung. Due to the bone metastasis, she was placed on palliative immunotherapy with pembrolizumab every 2 weeks, as well as zoledronic acid every 4 weeks.  She received her 1st cycle of pembrolizumab along with zoledronic acid on March 17th. She developed severe hypocalcemia after zoledronic acid requiring IV and resuming oral supplementation.  We therefore planned to delay zoledronic  acid until May.  INTERVAL HISTORY:  Gabrielle Hudson is here today for repeat clinical assessment prior to nivolumab, as well as potentially zoledronic acid. She reports chronic mild dyspnea with exertion which is stable.  She denies cough or chest pain.  She is using her oxygen regularly and continues to use a scooter to get around.  She denies diarrhea.  She denies skin rashes. She denies fevers or chills. She denies pain. Her appetite is good. Her weight has been stable. She states she continues calcium regularly.  She requests a refill of apixaban and diltiazem.  She is on apixaban for pulmonary embolism, so I will refill that. I asked her to contact her primary care provider regarding the diltiazem.  REVIEW OF SYSTEMS:  Review of Systems  Constitutional: Negative for appetite change, chills, fatigue, fever and unexpected weight change.  HENT:   Negative for lump/mass, mouth sores and sore throat.   Respiratory: Positive for shortness of breath (Chronic, stable dyspnea with exertion). Negative for cough.   Cardiovascular: Negative for chest pain and leg swelling.  Gastrointestinal: Negative for abdominal pain, constipation, diarrhea, nausea and vomiting.  Endocrine: Negative for hot flashes.  Genitourinary: Negative for difficulty urinating, dysuria, frequency and hematuria.   Musculoskeletal: Negative for arthralgias, back pain and myalgias.  Skin: Negative for rash.  Neurological: Negative for dizziness and headaches.  Hematological: Negative for adenopathy. Does not bruise/bleed  easily.  Psychiatric/Behavioral: Negative for depression and sleep disturbance. The patient is not nervous/anxious.      VITALS:  There were no vitals taken for this visit.  Wt Readings from Last 3 Encounters:  08/17/20 228 lb 12 oz (103.8 kg)  08/15/20 224 lb 4 oz (101.7 kg)  08/03/20 228 lb (103.4 kg)    There is no height or weight on file to calculate BMI.  Performance status (ECOG): 2 - Symptomatic, <50%  confined to bed  PHYSICAL EXAM:  Physical Exam Vitals and nursing note reviewed.  Constitutional:      General: She is not in acute distress.    Appearance: Normal appearance.  HENT:     Head: Normocephalic and atraumatic.     Mouth/Throat:     Mouth: Mucous membranes are moist.     Pharynx: Oropharynx is clear. No oropharyngeal exudate or posterior oropharyngeal erythema.  Eyes:     General: No scleral icterus.    Extraocular Movements: Extraocular movements intact.     Conjunctiva/sclera: Conjunctivae normal.     Pupils: Pupils are equal, round, and reactive to light.  Cardiovascular:     Rate and Rhythm: Normal rate and regular rhythm.     Heart sounds: Normal heart sounds. No murmur heard. No friction rub. No gallop.   Pulmonary:     Effort: Pulmonary effort is normal.     Breath sounds: Normal breath sounds. No wheezing, rhonchi or rales.  Chest:  Breasts:     Right: No axillary adenopathy or supraclavicular adenopathy.     Left: No axillary adenopathy or supraclavicular adenopathy.    Abdominal:     General: There is no distension.     Palpations: Abdomen is soft. There is no hepatomegaly, splenomegaly or mass.     Tenderness: There is no abdominal tenderness.  Musculoskeletal:        General: Normal range of motion.     Cervical back: Normal range of motion and neck supple. No tenderness.     Right lower leg: No edema.     Left lower leg: No edema.  Lymphadenopathy:     Cervical: No cervical adenopathy.     Upper Body:     Right upper body: No supraclavicular or axillary adenopathy.     Left upper body: No supraclavicular or axillary adenopathy.     Lower Body: No right inguinal adenopathy. No left inguinal adenopathy.  Skin:    General: Skin is warm and dry.     Coloration: Skin is not jaundiced.     Findings: No rash.  Neurological:     Mental Status: She is alert and oriented to person, place, and time.     Cranial Nerves: No cranial nerve deficit.   Psychiatric:        Mood and Affect: Mood normal.        Behavior: Behavior normal.        Thought Content: Thought content normal.    LABS:   CBC Latest Ref Rng & Units 08/15/2020 08/01/2020 07/18/2020  WBC - 6.4 5.6 5.7  Hemoglobin 12.0 - 16.0 12.0 11.8(A) 12.3  Hematocrit 36 - 46 37 36 37  Platelets 150 - 399 228 204 227   CMP Latest Ref Rng & Units 08/15/2020 08/01/2020 07/18/2020  Glucose 70 - 99 mg/dL - - -  BUN 4 - 21 18 14 14   Creatinine 0.5 - 1.1 0.9 0.8 0.8  Sodium 137 - 147 137 136(A) 126(A)  Potassium 3.4 - 5.3  4.4 4.2 4.3  Chloride 99 - 108 99 99 91(A)  CO2 13 - 22 32(A) 32(A) 27(A)  Calcium 8.7 - 10.7 9.5 8.8 8.3(A)  Total Protein 6.5 - 8.1 g/dL - - -  Total Bilirubin 0.3 - 1.2 mg/dL - - -  Alkaline Phos 25 - 125 80 90 101  AST 13 - 35 19 17 22   ALT 7 - 35 13 11 12      Lab Results  Component Value Date   CEA1 28.7 (H) 08/15/2020   /  CEA  Date Value Ref Range Status  08/15/2020 28.7 (H) 0.0 - 4.7 ng/mL Final    Comment:    (NOTE)                             Nonsmokers          <3.9                             Smokers             <5.6 [employer] Electrochemiluminescence Immunoassay (ECLIA) Values obtained with different assay methods or kits cannot be used interchangeably.  Results cannot be interpreted as absolute evidence of the presence or absence of malignant disease. Performed At: [facility] [place], [place] [id number] Rush Farmer MD SW:[id number]    No results found for: PSA1 No results found for: CAN199 No results found for: UKG254  Lab Results  Component Value Date   TOTALPROTELP 4.7 (L) 03/26/2011   ALBUMINELP 52.5 (L) 03/26/2011   A1GS 8.7 (H) 03/26/2011   A2GS 18.8 (H) 03/26/2011   BETS 4.5 (L) 03/26/2011   BETA2SER 4.7 03/26/2011   GAMS 10.8 (L) 03/26/2011   MSPIKE NOT DETECTED 03/26/2011   SPEI (NOTE) 03/26/2011   Lab Results  Component Value Date   TIBC 226 04/19/2020   TIBC 219 (L)  03/29/2011   FERRITIN 361 04/19/2020   FERRITIN 356 (H) 03/29/2011   IRONPCTSAT 44.2 04/19/2020   IRONPCTSAT 65 (H) 03/29/2011   Lab Results  Component Value Date   LDH 349 (H) 03/26/2011    STUDIES:  No results found.    HISTORY:   Past Medical History:  Diagnosis Date  . Anemia, unspecified   . Anemia, unspecified   . Cancer ([place]) 01/2009   SCC of the lung  . COPD (chronic obstructive pulmonary disease) ([facility]) 2-3 LPM  . GERD (gastroesophageal reflux disease)   . Hyposmolality syndrome   . Hyposmolality syndrome   . Hypothyroidism   . Malignant neoplasm of overlapping sites of right lung ([place])   . Malignant neoplasm of overlapping sites of right lung ([place])   . Malignant neoplasm of sigmoid colon ([place])   . Malignant neoplasm of sigmoid colon ([place])   . Shortness of breath   . Sleep apnea     Past Surgical History:  Procedure Laterality Date  . COLON SURGERY    . PORTACATH PLACEMENT  2010    Family History  Problem Relation Age of Onset  . Colon cancer Father   . Prostate cancer Father     Social History:  reports that she quit smoking about 12 years ago. Her smoking use included cigarettes. She has never used smokeless tobacco. She reports that she does not drink alcohol and does not use drugs.The patient is alone today.  Allergies:  Allergies  Allergen Reactions  .  Nitroglycerin In D5w Other (See Comments)    "flatlines"  . Nitroglycerin Other (See Comments)    Drops her BP 'flatlines'     Current Medications: Current Outpatient Medications  Medication Sig Dispense Refill  . albuterol (PROVENTIL) (5 MG/ML) 0.5% nebulizer solution Take 2.5 mg by nebulization daily.    . budesonide-formoterol (SYMBICORT) 160-4.5 MCG/ACT inhaler Inhale 2 puffs into the lungs 2 (two) times daily.      . calcium citrate-vitamin D (CITRACAL+D) 315-200 MG-UNIT tablet Take 1 tablet by mouth 2 (two) times daily.    . Cholecalciferol (VITAMIN D3) 1.25 MG (50000 UT) CAPS     .  Cholecalciferol 25 MCG (1000 UT) tablet Take by mouth.    . cyclobenzaprine (FLEXERIL) 10 MG tablet Take by mouth.    . diltiazem (CARDIZEM CD) 120 MG 24 hr capsule Take 120 mg by mouth daily.    Marland Kitchen ELIQUIS 5 MG TABS tablet Take 1 tablet by mouth twice daily 60 tablet 2  . EUTHYROX 88 MCG tablet Take 88 mcg by mouth daily.    . famotidine (PEPCID) 20 MG tablet Take 20 mg by mouth 2 (two) times daily.    . fluticasone (FLONASE) 50 MCG/ACT nasal spray Place into both nostrils.    . furosemide (LASIX) 20 MG tablet Take 20 mg by mouth as needed. ONLY TAKES IT HAVING SWELLING    . gabapentin (NEURONTIN) 300 MG capsule Take 300 mg by mouth as needed.    Marland Kitchen guaiFENesin (MUCINEX) 600 MG 12 hr tablet Take 1 tablet by mouth every 12 (twelve) hours.    . magnesium oxide (MAG-OX) 400 (241.3 Mg) MG tablet Take 1 tablet (400 mg total) by mouth daily. 30 tablet 5  . meclizine (ANTIVERT) 25 MG tablet Take 0.5-1 tablets (12.5-25 mg total) by mouth 3 (three) times daily as needed for dizziness. 30 tablet 0  . ondansetron (ZOFRAN) 4 MG tablet Take 1 tablet (4 mg total) by mouth every 4 (four) hours as needed for nausea. 90 tablet 3  . OXYGEN Inhale 4 L into the lungs continuous.    . pantoprazole (PROTONIX) 20 MG tablet Take by mouth.    . Potassium Chloride ER 20 MEQ TBCR Take 1 tablet by mouth 2 (two) times daily.    . prochlorperazine (COMPAZINE) 10 MG tablet Take 1 tablet (10 mg total) by mouth every 6 (six) hours as needed for nausea or vomiting. 90 tablet 3  . sodium chloride 1 g tablet Take by mouth.    . Vitamin D, Ergocalciferol, (DRISDOL) 1.25 MG (50000 UNIT) CAPS capsule Take 50,000 Units by mouth once a week.     No current facility-administered medications for this visit.     ASSESSMENT & PLAN:   Assessment:   1. History of stage IIB non-small cell lung cancer in November 2010. She remains without evidence of recurrent or metastatic disease.  2. History of stage I colon cancer.   3. Severe  oxygen dependant COPD.  4. Small cell carcinoma of the lung, at least a clinical stage IIIB, diagnosed in October 2021. CT imaging initially revealed a good response to chemotherapy with carboplatin/etoposide. 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  She is now receiving palliative immunotherapy with nivolumab, She is due for nivolumab this week.  5. 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 She was placed on zoledronic acid in March. As she had significant drop in her calcium, this was held in April.   6. Hypocalcemia, felt to be likely due to zoledronic acid, improved with IV and oral calcium supplement. Her calcium is normal today for the 1st time since receiving zoledronic acid in March, so I will continue to hold zoledronic acid until her next visit.Marland Kitchen   7. Chronic hyponatremia, which has improved.   Plan:    Overall, she is doing relatively well. We will proceed with nivolumab this week.  I will hold zoledronic acid an additional 2 weeks and if her calcium remains in normal range, plan to proceed with zoledronic acid at that time.  I will plan to see her back in 2 weeks with a CBC and comprehensive metabolic panel prior to a 6th cycle of nivolumab after which we will likely repeat CT imaging. The patient understands the plans discussed today and is in agreement with them.  She knows to contact our office if she develops concerns prior to her next appointment.      [name], NP

## 2020-08-30 ENCOUNTER — Other Ambulatory Visit: Payer: Self-pay

## 2020-08-30 ENCOUNTER — Inpatient Hospital Stay (INDEPENDENT_AMBULATORY_CARE_PROVIDER_SITE_OTHER): Payer: Medicare Other | Admitting: Hematology and Oncology

## 2020-08-30 ENCOUNTER — Encounter: Payer: Self-pay | Admitting: Hematology and Oncology

## 2020-08-30 ENCOUNTER — Inpatient Hospital Stay: Payer: Medicare Other | Attending: Hematology and Oncology

## 2020-08-30 VITALS — BP 154/71 | HR 82 | Temp 98.5°F | Resp 18 | Ht 63.0 in | Wt 233.8 lb

## 2020-08-30 DIAGNOSIS — C7952 Secondary malignant neoplasm of bone marrow: Secondary | ICD-10-CM | POA: Diagnosis not present

## 2020-08-30 DIAGNOSIS — Z86711 Personal history of pulmonary embolism: Secondary | ICD-10-CM | POA: Insufficient documentation

## 2020-08-30 DIAGNOSIS — D649 Anemia, unspecified: Secondary | ICD-10-CM | POA: Diagnosis not present

## 2020-08-30 DIAGNOSIS — Z7901 Long term (current) use of anticoagulants: Secondary | ICD-10-CM | POA: Insufficient documentation

## 2020-08-30 DIAGNOSIS — D241 Benign neoplasm of right breast: Secondary | ICD-10-CM | POA: Diagnosis not present

## 2020-08-30 DIAGNOSIS — Z79899 Other long term (current) drug therapy: Secondary | ICD-10-CM | POA: Insufficient documentation

## 2020-08-30 DIAGNOSIS — J449 Chronic obstructive pulmonary disease, unspecified: Secondary | ICD-10-CM | POA: Insufficient documentation

## 2020-08-30 DIAGNOSIS — Z85038 Personal history of other malignant neoplasm of large intestine: Secondary | ICD-10-CM | POA: Diagnosis not present

## 2020-08-30 DIAGNOSIS — C3432 Malignant neoplasm of lower lobe, left bronchus or lung: Secondary | ICD-10-CM

## 2020-08-30 DIAGNOSIS — E039 Hypothyroidism, unspecified: Secondary | ICD-10-CM | POA: Diagnosis not present

## 2020-08-30 DIAGNOSIS — E871 Hypo-osmolality and hyponatremia: Secondary | ICD-10-CM | POA: Insufficient documentation

## 2020-08-30 DIAGNOSIS — C3411 Malignant neoplasm of upper lobe, right bronchus or lung: Secondary | ICD-10-CM | POA: Insufficient documentation

## 2020-08-30 DIAGNOSIS — C7951 Secondary malignant neoplasm of bone: Secondary | ICD-10-CM

## 2020-08-30 DIAGNOSIS — Z9981 Dependence on supplemental oxygen: Secondary | ICD-10-CM | POA: Diagnosis not present

## 2020-08-30 DIAGNOSIS — Z5112 Encounter for antineoplastic immunotherapy: Secondary | ICD-10-CM | POA: Diagnosis not present

## 2020-08-30 DIAGNOSIS — E222 Syndrome of inappropriate secretion of antidiuretic hormone: Secondary | ICD-10-CM

## 2020-08-30 LAB — BASIC METABOLIC PANEL
BUN: 15 (ref 4–21)
CO2: 28 — AB (ref 13–22)
Chloride: 96 — AB (ref 99–108)
Creatinine: 0.9 (ref 0.5–1.1)
Glucose: 121
Potassium: 4.4 (ref 3.4–5.3)
Sodium: 132 — AB (ref 137–147)

## 2020-08-30 LAB — CMP (CANCER CENTER ONLY)
ALT: 12 U/L (ref 0–44)
AST: 13 U/L — ABNORMAL LOW (ref 15–41)
Albumin: 3.7 g/dL (ref 3.5–5.0)
Alkaline Phosphatase: 72 U/L (ref 38–126)
Anion gap: 10 (ref 5–15)
BUN: 15 mg/dL (ref 8–23)
CO2: 29 mmol/L (ref 22–32)
Calcium: 9 mg/dL (ref 8.9–10.3)
Chloride: 97 mmol/L — ABNORMAL LOW (ref 98–111)
Creatinine: 1.02 mg/dL — ABNORMAL HIGH (ref 0.44–1.00)
GFR, Estimated: 56 mL/min — ABNORMAL LOW (ref >60)
Glucose, Bld: 111 mg/dL — ABNORMAL HIGH (ref 70–99)
Potassium: 4.3 mmol/L (ref 3.5–5.1)
Sodium: 136 mmol/L (ref 135–145)
Total Bilirubin: 0.1 mg/dL — ABNORMAL LOW (ref 0.3–1.2)
Total Protein: 6.6 g/dL (ref 6.5–8.1)

## 2020-08-30 LAB — CBC AND DIFFERENTIAL
HCT: 36 (ref 36–46)
Hemoglobin: 11.9 — AB (ref 12.0–16.0)
Neutrophils Absolute: 5.04
Platelets: 226 (ref 150–399)
WBC: 7.2

## 2020-08-30 LAB — CBC
MCV: 90 (ref 81–99)
RBC: 4.05 (ref 3.87–5.11)

## 2020-08-30 LAB — TSH: TSH: 6.786 u[IU]/mL — ABNORMAL HIGH (ref 0.350–4.500)

## 2020-08-30 LAB — COMPREHENSIVE METABOLIC PANEL
Albumin: 4.2 (ref 3.5–5.0)
Calcium: 8.6 — AB (ref 8.7–10.7)

## 2020-08-30 LAB — HEPATIC FUNCTION PANEL
ALT: 14 (ref 7–35)
AST: 21 (ref 13–35)
Alkaline Phosphatase: 77 (ref 25–125)
Bilirubin, Total: 0.4

## 2020-08-30 NOTE — Progress Notes (Signed)
[place]  [address] [place],    [address] [phone]  Clinic Day:  08/30/2020  Referring physician: Jeanie Sewer, NP   CHIEF COMPLAINT:  CC:  Metastatic small cell lung cancer to bone  Current Treatment:    Palliative pembrolizumab every 2 weeks with monthly zoledronic acid, which has been on hold   HISTORY OF PRESENT ILLNESS:  Gabrielle Hudson is a 80 y.o. female with a history of stage IIB non-small-cell lung cancer of the right upper lobe diagnosed in November 2010. She had a large endobronchial component causing obstruction, so underwent laser therapy at [facility] with a good response. Due to severe emphysema and the location of the tumor being within 2 cm of the carina, she was not a surgical candidate. She was treated with concurrent radiation and chemotherapy with carboplatin and paclitaxel, followed by an additional 2 months of carboplatin and paclitaxel with a good response. She had bilateral pneumonia in December 2012. CT scan at that time revealed some abnormality concerning for recurrence, so a PET scan was obtained in January 2013. There was mild hypermetabolic activity in the right hilum, but the patient opted for observation. Repeat CT scans had been stable, with scarring of the posterior right upper lobe. She also has a history of a stage I adenocarcinoma of the colon, arising from a tubulovillous adenoma. Her colonoscopy in August 2012 had removal of multiple benign polyps. Her last mammogram was in December 2017. She had her Port-A-Cath removed in May 2018. She quit smoking about 11 years ago. CT chest in December 2018 revealed progression of amorphous soft tissue attenuation of the right hilum. There was no definite lymphadenopathy. There were stable scattered tiny pulmonary nodules all less than 5 mm. There is evidence of emphysema. There is a stable right liver lesion measuring 2.5  cm which was unchanged for over 5 years. CT chest in March of 2019 and September of 2019 were stable, with soft tissue attenuation is in the area of previous radiation. CT chest in October of 2020 revealed all previously noted pulmonary nodules measuring 4 mm or less in size are stable compared to the prior examination, and are considered definitively benign. She also underwent bilateral diagnostic mammogram and right breast ultrasound which revealed the likely benign mass in the right breast at 2:30 is stable. CEA had decreased from 4.6 to 4.4.   She was referred back from [facility] in October 2021 with a diagnosis of a new small cell lung cancer. She presented with hemoptysis and pain of the left lower thorax. CT revealed tumor encasing and narrowing the left lower lobe pulmonary artery and the left lower lobe bronchus. There is a mass of the left hilum measuring 5.2 cm with left lower lobe atelectasis. She also has moderate emphysema and mild cardiomegaly, with evidence of coronary artery disease. Biopsy revealed small cell carcinoma with crush artifact. She had associated hyponatremia as low as 125, and required demeclocycline. She was also placed on prednisone, 40 mg daily for 5 days, then 20 mg daily. She complains of occasional cough productive of creamy colored sputum and occasional wheezing but no further hemoptysis. She is using a lidoderm patch for her left sided chest pain with partial relief. She had been hospitalized several times in August of this year for hyponatremia before her new lung cancer was diagnosed. In retrospect, the left hilar cancer was not obvious on chest x-ray. She was found to be  iron deficient and her potassium was down to 3.0. Baseline CEA was 14.1. She received 3 cycles of carboplatin/etoposide and has problems with hypomagnesemia Her CEA had increased in November to 48.3 from 14.1 in October, so we were concerned she may not be responding.  After 3 cycles of carboplatin/etoposide, she was hospitalized with severe weakness and diarrhea. She was found to have severe neutropenia, which worsened during hospitalization to as low as 0.5 with an ANC of 80, despite Neulasta. Her white blood count finally improved to 1.5 with an Cole of 650. Her hemoglobin was down to 7 and her platelets steadily decreased from 140,000 at admission down to 17,000. She denied bleeding, but did have severe bruising. She received platelets and PRBC's during hospitalization. Her platelets were 50,000 on discharge. She also had a UTI with Klebsiella pneumoniae, which was treated with IV ceftrixone. CT chest in the emergency department revealed extensive bilateral lower lobe segmental pulmonary emboli almost occlusive in the left lower lobe, so she was placed on heparin. She was discharged In January on apixaban 5 mg twice a day. We reviewed the previous imaging reports and there was good shrinkage of the tumor.  Her blood counts recovered, so we wanted to give a 4th cycle of chemotherapy with reduction in doses prior to placing her on observation.  We were concerned that she may not be able to tolerate further chemotherapy. She received a 4th cycle of carboplatin/etoposide beginning January 31st with a 25% dose reduction and tolerated it well. Her CEA was fairly stable at 42.5 in January, and was down to 22.5 in early February.  Repeat imaging in March, unfortunately, revealed new osseous metastasis.  There were radiation changes in the right lung, but no obvious progressive disease in the lung. Due to the bone metastasis, she was placed on palliative immunotherapy with pembrolizumab every 2 weeks, as well as zoledronic acid every 4 weeks.  She received her 1st cycle of nivolumab along with zoledronic acid on March 17th. She developed severe hypocalcemia after zoledronic acid requiring IV and resuming oral supplementation.  We therefore planned to delay zoledronic  acid until May. She has continued nivolumab every 2 weeks without difficulty. The CEA increased up to 30 in May.  INTERVAL HISTORY:  Gabrielle Hudson is here today to review recent CT imaging prior to continuing nivolumab.  She states she has been doing fairly well.  She reports chronic dyspnea with exertion which is stable.  She reports an occasional nonproductive cough, which is improved from previous. She is using her oxygen regularly and continues to use a scooter to get around.  She denies diarrhea.  She denies skin rashes.  She denies fevers or chills. She denies pain. Her appetite is good. Her weight has increased 5 pounds over last 2 weeks.   REVIEW OF SYSTEMS:  Review of Systems  Constitutional: Negative for appetite change, chills, fatigue, fever and unexpected weight change.  HENT:   Negative for lump/mass, mouth sores and sore throat.   Respiratory: Positive for cough (Occasional, nonproductive, improved from previous) and shortness of breath (Chronic, stable dyspnea with exertion).   Cardiovascular: Negative for chest pain and leg swelling.  Gastrointestinal: Negative for abdominal pain, constipation, diarrhea, nausea and vomiting.  Endocrine: Negative for hot flashes.  Genitourinary: Negative for difficulty urinating, dysuria, frequency and hematuria.   Musculoskeletal: Negative for arthralgias, back pain and myalgias.  Skin: Negative for rash.  Neurological: Negative for dizziness and headaches.  Hematological: Negative for adenopathy. Does not bruise/bleed easily.  Psychiatric/Behavioral: Negative for depression and sleep disturbance. The patient is not nervous/anxious.    VITALS:  Blood pressure (!) 154/71, pulse 82, temperature 98.5 F (36.9 C), temperature source Oral, resp. rate 18, height 5\' 3"  (1.6 m), weight 233 lb 12.8 oz (106.1 kg), SpO2 98 %.  Wt Readings from Last 3 Encounters:  08/30/20 233 lb 12.8 oz (106.1 kg)  08/17/20 228 lb 12 oz (103.8 kg)  08/15/20 224 lb 4 oz  (101.7 kg)    Body mass index is 41.42 kg/m.  Performance status (ECOG): 2 - Symptomatic, <50% confined to bed  PHYSICAL EXAM:  Physical Exam Vitals and nursing note reviewed.  Constitutional:      General: She is not in acute distress.    Appearance: Normal appearance.  HENT:     Head: Normocephalic and atraumatic.     Mouth/Throat:     Mouth: Mucous membranes are moist.     Pharynx: Oropharynx is clear. No oropharyngeal exudate or posterior oropharyngeal erythema.  Eyes:     General: No scleral icterus.    Extraocular Movements: Extraocular movements intact.     Conjunctiva/sclera: Conjunctivae normal.     Pupils: Pupils are equal, round, and reactive to light.  Cardiovascular:     Rate and Rhythm: Normal rate and regular rhythm.     Heart sounds: Normal heart sounds. No murmur heard. No friction rub. No gallop.   Pulmonary:     Effort: Pulmonary effort is normal.     Breath sounds: Normal breath sounds. No wheezing, rhonchi or rales.  Chest:  Breasts:     Right: No axillary adenopathy or supraclavicular adenopathy.     Left: No axillary adenopathy or supraclavicular adenopathy.    Abdominal:     General: There is no distension.     Palpations: Abdomen is soft. There is no hepatomegaly, splenomegaly or mass.     Tenderness: There is no abdominal tenderness.  Musculoskeletal:        General: Normal range of motion.     Cervical back: Normal range of motion and neck supple. No tenderness.     Right lower leg: No edema.     Left lower leg: No edema.  Lymphadenopathy:     Cervical: No cervical adenopathy.     Upper Body:     Right upper body: No supraclavicular or axillary adenopathy.     Left upper body: No supraclavicular or axillary adenopathy.     Lower Body: No right inguinal adenopathy. No left inguinal adenopathy.  Skin:    General: Skin is warm and dry.     Coloration: Skin is not jaundiced.     Findings: No rash.  Neurological:     Mental Status: She is  alert and oriented to person, place, and time.     Cranial Nerves: No cranial nerve deficit.  Psychiatric:        Mood and Affect: Mood normal.        Behavior: Behavior normal.        Thought Content: Thought content normal.    LABS:   CBC Latest Ref Rng & Units 08/30/2020 08/15/2020 08/01/2020  WBC - 7.2 6.4 5.6  Hemoglobin 12.0 - 16.0 11.9(A) 12.0 11.8(A)  Hematocrit 36 - 46 36 37 36  Platelets 150 - 399 226 228 204   CMP Latest Ref Rng & Units 08/30/2020 08/15/2020 08/01/2020  Glucose 70 - 99 mg/dL - - -  BUN 4 - 21 15 18 14   Creatinine 0.5 -  1.1 0.9 0.9 0.8  Sodium 137 - 147 132(A) 137 136(A)  Potassium 3.4 - 5.3 4.4 4.4 4.2  Chloride 99 - 108 96(A) 99 99  CO2 13 - 22 28(A) 32(A) 32(A)  Calcium 8.7 - 10.7 8.6(A) 9.5 8.8  Total Protein 6.5 - 8.1 g/dL - - -  Total Bilirubin 0.3 - 1.2 mg/dL - - -  Alkaline Phos 25 - 125 77 80 90  AST 13 - 35 21 19 17   ALT 7 - 35 14 13 11      Lab Results  Component Value Date   CEA1 28.7 (H) 08/15/2020   /  CEA  Date Value Ref Range Status  08/15/2020 28.7 (H) 0.0 - 4.7 ng/mL Final    Comment:    (NOTE)                             Nonsmokers          <3.9                             Smokers             <5.6 [employer] Electrochemiluminescence Immunoassay (ECLIA) Values obtained with different assay methods or kits cannot be used interchangeably.  Results cannot be interpreted as absolute evidence of the presence or absence of malignant disease. Performed At: [facility] [place], [place] [id number] Rush Farmer MD GB:[id number]    No results found for: PSA1 No results found for: CAN199 No results found for: XTG626  Lab Results  Component Value Date   TOTALPROTELP 4.7 (L) 03/26/2011   ALBUMINELP 52.5 (L) 03/26/2011   A1GS 8.7 (H) 03/26/2011   A2GS 18.8 (H) 03/26/2011   BETS 4.5 (L) 03/26/2011   BETA2SER 4.7 03/26/2011   GAMS 10.8 (L) 03/26/2011   MSPIKE NOT DETECTED 03/26/2011   SPEI (NOTE)  03/26/2011   Lab Results  Component Value Date   TIBC 226 04/19/2020   TIBC 219 (L) 03/29/2011   FERRITIN 361 04/19/2020   FERRITIN 356 (H) 03/29/2011   IRONPCTSAT 44.2 04/19/2020   IRONPCTSAT 65 (H) 03/29/2011   Lab Results  Component Value Date   LDH 349 (H) 03/26/2011    STUDIES:  No results found.  Exam(s): [id number] CT/CT CHEST-ABD-PELV W/O IV CM CLINICAL DATA:  History of bilateral lung cancer. More recent diagnosis of left lung cancer with bone Mets. Remote history of right lung cancer in 2011.  EXAM: CT CHEST, ABDOMEN AND PELVIS WITHOUT CONTRAST  TECHNIQUE: Multidetector CT imaging of the chest, abdomen and pelvis was performed following the standard protocol without IV contrast.  COMPARISON:  05/29/2020  FINDINGS: CT CHEST FINDINGS  Cardiovascular: The heart is normal in size. No pericardial effusion. Stable prominent pericardial and epicardial fat. Stable advanced aortic atherosclerotic calcifications and three-vessel coronary artery calcifications.  Mediastinum/Nodes: No mediastinal or hilar mass for adenopathy. Stable marked soft tissue thickening surrounding the right mainstem bronchus along with paramediastinal radiation fibrosis. I do not see any findings suspicious for recurrent tumor. The esophagus is grossly normal.  Lungs/Pleura: Stable severe underlying emphysematous changes and areas of pulmonary scarring.  Stable dense radiation fibrosis in the right hilar region.  Dense linear band of scarring type changes involving the left lower lobe adjacent to the major fissure. Progressive soft tissue thickening between the aorta and the left mainstem bronchus suspicious for recurrent tumor. Patient had a  large mass in this area on the prior CT scan from October 2021 but I do not see any residual tumor in this area on the more recent CT scans. PET-CT may be helpful for further evaluation.  Stable small subpleural right upper lobe pulmonary nodule.  No new pulmonary nodules  Musculoskeletal: No chest wall mass, supraclavicular or axillary adenopathy.  CT ABDOMEN PELVIS FINDINGS  Hepatobiliary: Stable lesion in the right hepatic lobe posteriorly. No new lesions. The gallbladder is surgically absent. No common bile duct dilatation.  Pancreas: No mass, inflammation or ductal dilatation.  Spleen: Normal size.  No focal lesions.  Adrenals/Urinary Tract: The adrenal glands are unremarkable.  No renal lesions or renal calculi.  The bladder is grossly normal.  Stomach/Bowel: The stomach, duodenum, small bowel and colon are unremarkable. No acute inflammatory changes, mass lesions or obstructive findings. Stable sigmoid colon diverticulosis.  Vascular/Lymphatic: Stable advanced atherosclerotic calcifications involving the aorta and branch vessels but no aneurysm. No mesenteric or retroperitoneal mass or adenopathy.  Reproductive: The uterus and ovaries are unremarkable and stable.  Other: No pelvic mass or adenopathy. No free pelvic fluid collections. No inguinal mass or adenopathy. No abdominal wall hernia or subcutaneous lesions.  Musculoskeletal: Stable scattered metastatic sclerotic bone lesions.  Chronic bilateral hip AVN.  IMPRESSION: 1. Progressive soft tissue thickening between the aorta and the left mainstem bronchus suspicious for recurrent tumor. PET-CT may be helpful for further evaluation if clinically indicated. 2. Stable radiation changes involving the right hilum and right lower lobe. 3. Stable severe emphysematous changes and pulmonary scarring. 4. Stable right hepatic lobe lesion. 5. Stable scattered sclerotic bone lesions. 6. Stable advanced atherosclerotic calcifications involving the thoracic and abdominal aorta and branch vessels including the coronary arteries. 7. Chronic bilateral hip AVN. 8. Emphysema and aortic atherosclerosis.  Aortic Atherosclerosis (ICD10-I70.0) and Emphysema  (ICD10-J43.9).  HISTORY:   Past Medical History:  Diagnosis Date  . Anemia, unspecified   . Anemia, unspecified   . Cancer ([place]) 01/2009   SCC of the lung  . COPD (chronic obstructive pulmonary disease) (HCC) 2-3 LPM  . GERD (gastroesophageal reflux disease)   . Hyposmolality syndrome   . Hyposmolality syndrome   . Hypothyroidism   . Malignant neoplasm of overlapping sites of right lung ([place])   . Malignant neoplasm of overlapping sites of right lung ([place])   . Malignant neoplasm of sigmoid colon ([place])   . Malignant neoplasm of sigmoid colon ([place])   . Shortness of breath   . Sleep apnea     Past Surgical History:  Procedure Laterality Date  . COLON SURGERY    . PORTACATH PLACEMENT  2010    Family History  Problem Relation Age of Onset  . Colon cancer Father   . Prostate cancer Father     Social History:  reports that she quit smoking about 12 years ago. Her smoking use included cigarettes. She has never used smokeless tobacco. She reports that she does not drink alcohol and does not use drugs.The patient is alone today.  Allergies:  Allergies  Allergen Reactions  . Nitroglycerin In D5w Other (See Comments)    "flatlines"  . Nitroglycerin Other (See Comments)    Drops her BP 'flatlines'     Current Medications: Current Outpatient Medications  Medication Sig Dispense Refill  . albuterol (PROVENTIL) (5 MG/ML) 0.5% nebulizer solution Take 2.5 mg by nebulization daily.    . budesonide-formoterol (SYMBICORT) 160-4.5 MCG/ACT inhaler Inhale 2 puffs into the lungs 2 (two) times daily.      Marland Kitchen  calcium citrate-vitamin D (CITRACAL+D) 315-200 MG-UNIT tablet Take 1 tablet by mouth 2 (two) times daily.    . Cholecalciferol (VITAMIN D3) 1.25 MG (50000 UT) CAPS     . Cholecalciferol 25 MCG (1000 UT) tablet Take by mouth.    . cyclobenzaprine (FLEXERIL) 10 MG tablet Take by mouth.    . diltiazem (CARDIZEM CD) 120 MG 24 hr capsule Take 120 mg by mouth daily.    Marland Kitchen ELIQUIS 5 MG TABS  tablet Take 1 tablet by mouth twice daily 60 tablet 2  . EUTHYROX 88 MCG tablet Take 88 mcg by mouth daily.    . famotidine (PEPCID) 20 MG tablet Take 20 mg by mouth 2 (two) times daily.    . fluticasone (FLONASE) 50 MCG/ACT nasal spray Place into both nostrils.    . furosemide (LASIX) 20 MG tablet Take 20 mg by mouth as needed. ONLY TAKES IT HAVING SWELLING    . gabapentin (NEURONTIN) 300 MG capsule Take 300 mg by mouth as needed.    Marland Kitchen guaiFENesin (MUCINEX) 600 MG 12 hr tablet Take 1 tablet by mouth every 12 (twelve) hours.    . magnesium oxide (MAG-OX) 400 (241.3 Mg) MG tablet Take 1 tablet (400 mg total) by mouth daily. 30 tablet 5  . meclizine (ANTIVERT) 25 MG tablet Take 0.5-1 tablets (12.5-25 mg total) by mouth 3 (three) times daily as needed for dizziness. 30 tablet 0  . ondansetron (ZOFRAN) 4 MG tablet Take 1 tablet (4 mg total) by mouth every 4 (four) hours as needed for nausea. 90 tablet 3  . OXYGEN Inhale 4 L into the lungs continuous.    . pantoprazole (PROTONIX) 20 MG tablet Take by mouth.    . Potassium Chloride ER 20 MEQ TBCR Take 1 tablet by mouth 2 (two) times daily.    . prochlorperazine (COMPAZINE) 10 MG tablet Take 1 tablet (10 mg total) by mouth every 6 (six) hours as needed for nausea or vomiting. 90 tablet 3  . sodium chloride 1 g tablet Take by mouth.    . Vitamin D, Ergocalciferol, (DRISDOL) 1.25 MG (50000 UNIT) CAPS capsule Take 50,000 Units by mouth once a week.     No current facility-administered medications for this visit.     ASSESSMENT & PLAN:   Assessment:   1. History of stage IIB non-small cell lung cancer in November 2010.  2. History of stage I colon cancer.   3. Severe oxygen dependant COPD.  4. Small cell carcinoma of the lung, at least a clinical stage IIIB, diagnosed in October 2021.   She was initially treated with chemotherapy with carboplatin/etoposide with a good response. However, she developed new bone metastasis with sclerotic lesions  in the L2 and L4 vertebral bodies, left sacrum and left ischium, as well as mild changes of avascular necrosis in both femoral heads.  She is now receiving palliative immunotherapy with nivolumab.  Recent CT imaging reveals progressive soft tissue thickening between the aorta and left mainstem bronchus suspicious for recurrent tumor with stable bone metastasis.  There has also been an increase in the CEA.  I will schedule her for a PET-CT for further evaluation. She is hesitant to proceed with PET as she has difficulty lying on the hard table, but eventually agrees to this.  5. 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 She was placed on zoledronic acid in March.  She developed severe hypocalcemia with her 1st dose and we have not resumed this yet.  We will wait until we decide on her treatment going forward to resume zoledronic acid.  6. Hypocalcemia, felt to be likely due to zoledronic acid, her calcium remains normal today.  7. Chronic hyponatremia, which is stable.   Plan:   We will schedule her PET and have her see Dr. Burr Medico after that is done for further treatment recommendations. The patient understands the plans discussed today and is in agreement with them.  She knows to contact our office if she develops concerns prior to her next appointment.      [name], PA-C

## 2020-08-31 ENCOUNTER — Encounter: Payer: Self-pay | Admitting: Hematology and Oncology

## 2020-08-31 ENCOUNTER — Inpatient Hospital Stay: Payer: Medicare Other

## 2020-08-31 ENCOUNTER — Encounter: Payer: Self-pay | Admitting: Hematology

## 2020-09-01 ENCOUNTER — Other Ambulatory Visit: Payer: Self-pay | Admitting: Hematology

## 2020-09-01 LAB — T4: T4, Total: 7.8 ug/dL (ref 4.5–12.0)

## 2020-09-05 ENCOUNTER — Telehealth: Payer: Self-pay | Admitting: Hematology and Oncology

## 2020-09-05 NOTE — Progress Notes (Signed)
Sent in request for DOS 06/20, 06/21, and 06/23 to [employer].

## 2020-09-05 NOTE — Telephone Encounter (Signed)
09/05/20 left msgs upcoming PET SCAN appt

## 2020-09-08 ENCOUNTER — Encounter: Payer: Self-pay | Admitting: Oncology

## 2020-09-11 NOTE — Progress Notes (Signed)
[facility]   [phone] [phone]   Clinic Follow up Note   Patient Care Team: Jeanie Sewer, NP as PCP - General (Family Medicine) Gardiner Rhyme, MD as Attending Physician (Pulmonary Disease) Governor Rooks., DO as Attending Physician (General Surgery) 09/12/2020  CHIEF COMPLAINT: f/u small cell lung cancer, on Nivolumab   SUMMARY OF ONCOLOGIC HISTORY: Oncology History  Small cell carcinoma of lower lobe of left lung ([place])  01/20/2020 Initial Diagnosis   Lung cancer, lower lobe ([place])    01/20/2020 Cancer Staging   Staging form: Lung, AJCC 8th Edition - Clinical: Stage IIIB (cT3, cN2, cM0) - Signed by Derwood Kaplan, MD on 01/20/2020    01/21/2020 Cancer Staging   Staging form: Lung, AJCC 8th Edition - Pathologic stage from 01/21/2020: Stage IIIB (pT3, pN2, cM0) - Signed by Derwood Kaplan, MD on 01/21/2020    01/31/2020 - 04/28/2020 Chemotherapy          06/08/2020 -  Chemotherapy    Patient is on Treatment Plan: LUNG NIVOLUMAB Q14D       Secondary malignant neoplasm of bone and bone marrow ([place])    CURRENT THERAPY: Nivolumab every 2 weeks  INTERVAL HISTORY:  Ms Koval returns for follow-up before her nivolumab treatment.  She came in a automatic wheelchair with nasal cannula oxygen.  She denies any new symptoms since a month ago.  She is still on 4 L nasal.  She has moderate fatigue, able to do most ADLs.  She usually does not get out of bed until noon, and she missed her PET scan yesterday.  She really does not want PET scan.  She has mild intermittent cough, no fever, chills, or sputum production.  Her weight is stable.  No other new pain.    REVIEW OF SYSTEMS:   Constitutional: Denies fevers, chills or abnormal weight loss Eyes: Denies blurriness of vision Ears, nose, mouth, throat, and face: Denies mucositis or sore throat Respiratory: (+) Chronic dyspnea on exertion, unchanged. Cardiovascular: Denies  palpitation, chest discomfort or lower extremity swelling Gastrointestinal:  Denies nausea, heartburn or change in bowel habits Skin: Denies abnormal skin rashes Lymphatics: Denies new lymphadenopathy or easy bruising Neurological:Denies numbness, tingling or new weaknesses Behavioral/Psych: Mood is stable, no new changes  All other systems were reviewed with the patient and are negative.  MEDICAL HISTORY:  Past Medical History:  Diagnosis Date   Anemia, unspecified    Anemia, unspecified    Cancer ([place]) 01/2009   SCC of the lung   COPD (chronic obstructive pulmonary disease) (HCC) 2-3 LPM   GERD (gastroesophageal reflux disease)    Hyposmolality syndrome    Hyposmolality syndrome    Hypothyroidism    Malignant neoplasm of overlapping sites of right lung (HCC)    Malignant neoplasm of overlapping sites of right lung (HCC)    Malignant neoplasm of sigmoid colon (HCC)    Malignant neoplasm of sigmoid colon (HCC)    Shortness of breath    Sleep apnea     SURGICAL HISTORY: Past Surgical History:  Procedure Laterality Date   COLON SURGERY     PORTACATH PLACEMENT  2010    I have reviewed the social history and family history with the patient and they are unchanged from previous note.  ALLERGIES:  is allergic to nitroglycerin in d5w and nitroglycerin.  MEDICATIONS:  Current Outpatient Medications  Medication Sig Dispense Refill   albuterol (PROVENTIL) (5 MG/ML) 0.5% nebulizer solution Take 2.5 mg by nebulization daily.  budesonide-formoterol (SYMBICORT) 160-4.5 MCG/ACT inhaler Inhale 2 puffs into the lungs 2 (two) times daily.       calcium citrate-vitamin D (CITRACAL+D) 315-200 MG-UNIT tablet Take 1 tablet by mouth 2 (two) times daily.     Cholecalciferol (VITAMIN D3) 1.25 MG (50000 UT) CAPS      Cholecalciferol 25 MCG (1000 UT) tablet Take by mouth.     cyclobenzaprine (FLEXERIL) 10 MG tablet Take by mouth.     diltiazem (CARDIZEM CD) 120 MG 24 hr capsule Take 120 mg by  mouth daily.     ELIQUIS 5 MG TABS tablet Take 1 tablet by mouth twice daily 60 tablet 2   EUTHYROX 88 MCG tablet Take 88 mcg by mouth daily.     famotidine (PEPCID) 20 MG tablet Take 20 mg by mouth 2 (two) times daily.     fluticasone (FLONASE) 50 MCG/ACT nasal spray Place into both nostrils.     furosemide (LASIX) 20 MG tablet Take 20 mg by mouth as needed. ONLY TAKES IT HAVING SWELLING     gabapentin (NEURONTIN) 300 MG capsule Take 300 mg by mouth as needed.     guaiFENesin (MUCINEX) 600 MG 12 hr tablet Take 1 tablet by mouth every 12 (twelve) hours.     magnesium oxide (MAG-OX) 400 (241.3 Mg) MG tablet Take 1 tablet (400 mg total) by mouth daily. 30 tablet 5   meclizine (ANTIVERT) 25 MG tablet Take 0.5-1 tablets (12.5-25 mg total) by mouth 3 (three) times daily as needed for dizziness. 30 tablet 0   ondansetron (ZOFRAN) 4 MG tablet Take 1 tablet (4 mg total) by mouth every 4 (four) hours as needed for nausea. 90 tablet 3   OXYGEN Inhale 4 L into the lungs continuous.     pantoprazole (PROTONIX) 20 MG tablet Take by mouth.     Potassium Chloride ER 20 MEQ TBCR Take 1 tablet by mouth 2 (two) times daily.     prochlorperazine (COMPAZINE) 10 MG tablet Take 1 tablet (10 mg total) by mouth every 6 (six) hours as needed for nausea or vomiting. 90 tablet 3   sodium chloride 1 g tablet Take by mouth.     Vitamin D, Ergocalciferol, (DRISDOL) 1.25 MG (50000 UNIT) CAPS capsule Take 50,000 Units by mouth once a week.     No current facility-administered medications for this visit.    PHYSICAL EXAMINATION: ECOG PERFORMANCE STATUS: 3 - Symptomatic, >50% confined to bed  Vitals:   09/12/20 1207  BP: (!) 163/72  Pulse: 80  Resp: 18  Temp: 98.7 F (37.1 C)  SpO2: 100%    Filed Weights   09/12/20 1207  Weight: 235 lb 0.6 oz (106.6 kg)     GENERAL:alert, no distress and comfortable SKIN: skin color, texture, turgor are normal, no rashes or significant lesions EYES: normal, Conjunctiva are  pink and non-injected, sclera clear OROPHARYNX:no exudate, no erythema and lips, buccal mucosa, and tongue normal  NECK: supple, thyroid normal size, non-tender, without nodularity LYMPH:  no palpable lymphadenopathy in the cervical, axillary or inguinal LUNGS: clear to auscultation and percussion with normal breathing effort HEART: regular rate & rhythm and no murmurs and no lower extremity edema ABDOMEN:abdomen soft, non-tender and normal bowel sounds Musculoskeletal:no cyanosis of digits and no clubbing  NEURO: alert & oriented x 3 with fluent speech, no focal motor/sensory deficits  LABORATORY DATA:  I have reviewed the data as listed CBC Latest Ref Rng & Units 08/30/2020 08/15/2020 08/01/2020  WBC - 7.2 6.4 5.6  Hemoglobin 12.0 - 16.0 11.9(A) 12.0 11.8(A)  Hematocrit 36 - 46 36 37 36  Platelets 150 - 399 226 228 204     CMP Latest Ref Rng & Units 08/30/2020 08/30/2020 08/15/2020  Glucose 70 - 99 mg/dL 111(H) - -  BUN 8 - 23 mg/dL 15 15 18   Creatinine 0.44 - 1.00 mg/dL 1.02(H) 0.9 0.9  Sodium 135 - 145 mmol/L 136 132(A) 137  Potassium 3.5 - 5.1 mmol/L 4.3 4.4 4.4  Chloride 98 - 111 mmol/L 97(L) 96(A) 99  CO2 22 - 32 mmol/L 29 28(A) 32(A)  Calcium 8.9 - 10.3 mg/dL 9.0 8.6(A) 9.5  Total Protein 6.5 - 8.1 g/dL 6.6 - -  Total Bilirubin 0.3 - 1.2 mg/dL 0.1(L) - -  Alkaline Phos 38 - 126 U/L 72 77 80  AST 15 - 41 U/L 13(L) 21 19  ALT 0 - 44 U/L 12 14 13       RADIOGRAPHIC STUDIES: I have personally reviewed the radiological images as listed and agreed with the findings in the report. No results found.   ASSESSMENT & PLAN:  80 year old Caucasian female with past medical history of COPD on home oxygen  1.  Small cell lung cancer of left lung, with bone metastasis  -Initially stage IIIb, diagnosed in October 2021, status post chemo carboplatin and etoposide with excellent response.  She subsequently developed bone metastasis  -She started Nivo every 2 weeks in March 2022, status post  5 cycles, with excellent tolerance, no noticeable side effects. -I personally reviewed restaging CT scan, which showed stable sclerotic bone lesions, soft tissue aminopterin maintenance pain bronchus, no evidence of disease progression.  Patient declined PET scan.  Plan to repeat CT scan in 2 months for close f/u.  I will continue Nivo now, since we do not have definitive evidence of cancer progression -She is clinically stable, no pain or other concerns for disease progression clinically, exam was unremarkable -Today's lab was drawn here, results are still pending -Plan to proceed cycle 7 nivolumab in 2 days -f/u in 2 weeks    2. Bone metastasis -Started Zometa on 06/08/2020, it has been held due to hypocalcemia  -continue very 3 months, next due in late June   3.  Severe COPD, oxygen dependent  4.  History of stage IIb non-small cell lung cancer in November 2020   5.  History of stage I colon cancer  PLan -lab results pending, if adequate, will proceed to cycle 7 Nivolumab in 2 days -lab and f/u in 2 weeks.before next cycle Nivo   No orders of the defined types were placed in this encounter.  All questions were answered. The patient knows to call the clinic with any problems, questions or concerns. No barriers to learning was detected. I spent 25 minutes counseling the patient face to face. The total time spent in the appointment was 30 minutes and more than 50% was on counseling and review of test results     Truitt Merle, MD 09/12/20

## 2020-09-12 ENCOUNTER — Inpatient Hospital Stay (INDEPENDENT_AMBULATORY_CARE_PROVIDER_SITE_OTHER): Payer: Medicare Other | Admitting: Hematology

## 2020-09-12 ENCOUNTER — Encounter: Payer: Self-pay | Admitting: Hematology and Oncology

## 2020-09-12 ENCOUNTER — Inpatient Hospital Stay: Payer: Medicare Other

## 2020-09-12 ENCOUNTER — Encounter: Payer: Self-pay | Admitting: Hematology

## 2020-09-12 ENCOUNTER — Other Ambulatory Visit: Payer: Self-pay

## 2020-09-12 VITALS — BP 163/72 | HR 80 | Temp 98.7°F | Resp 18 | Wt 235.0 lb

## 2020-09-12 DIAGNOSIS — C7951 Secondary malignant neoplasm of bone: Secondary | ICD-10-CM

## 2020-09-12 DIAGNOSIS — Z7901 Long term (current) use of anticoagulants: Secondary | ICD-10-CM | POA: Diagnosis not present

## 2020-09-12 DIAGNOSIS — C3431 Malignant neoplasm of lower lobe, right bronchus or lung: Secondary | ICD-10-CM | POA: Diagnosis not present

## 2020-09-12 DIAGNOSIS — Z86711 Personal history of pulmonary embolism: Secondary | ICD-10-CM | POA: Diagnosis not present

## 2020-09-12 DIAGNOSIS — C7952 Secondary malignant neoplasm of bone marrow: Secondary | ICD-10-CM | POA: Diagnosis not present

## 2020-09-12 DIAGNOSIS — C3432 Malignant neoplasm of lower lobe, left bronchus or lung: Secondary | ICD-10-CM

## 2020-09-12 DIAGNOSIS — Z5112 Encounter for antineoplastic immunotherapy: Secondary | ICD-10-CM | POA: Diagnosis not present

## 2020-09-12 DIAGNOSIS — E039 Hypothyroidism, unspecified: Secondary | ICD-10-CM | POA: Diagnosis not present

## 2020-09-12 DIAGNOSIS — C3411 Malignant neoplasm of upper lobe, right bronchus or lung: Secondary | ICD-10-CM | POA: Diagnosis not present

## 2020-09-12 DIAGNOSIS — E222 Syndrome of inappropriate secretion of antidiuretic hormone: Secondary | ICD-10-CM

## 2020-09-12 DIAGNOSIS — Z9981 Dependence on supplemental oxygen: Secondary | ICD-10-CM | POA: Diagnosis not present

## 2020-09-12 DIAGNOSIS — Z79899 Other long term (current) drug therapy: Secondary | ICD-10-CM | POA: Diagnosis not present

## 2020-09-12 DIAGNOSIS — J449 Chronic obstructive pulmonary disease, unspecified: Secondary | ICD-10-CM | POA: Diagnosis not present

## 2020-09-12 DIAGNOSIS — E871 Hypo-osmolality and hyponatremia: Secondary | ICD-10-CM | POA: Diagnosis not present

## 2020-09-12 DIAGNOSIS — D241 Benign neoplasm of right breast: Secondary | ICD-10-CM | POA: Diagnosis not present

## 2020-09-12 DIAGNOSIS — Z85038 Personal history of other malignant neoplasm of large intestine: Secondary | ICD-10-CM | POA: Diagnosis not present

## 2020-09-12 LAB — CBC AND DIFFERENTIAL
HCT: 36 (ref 36–46)
Hemoglobin: 11.7 — AB (ref 12.0–16.0)
Neutrophils Absolute: 4.22
Platelets: 246 (ref 150–399)
WBC: 6.2

## 2020-09-12 LAB — BASIC METABOLIC PANEL
BUN: 11 (ref 4–21)
CO2: 29 — AB (ref 13–22)
Chloride: 94 — AB (ref 99–108)
Creatinine: 0.8 (ref 0.5–1.1)
Glucose: 110
Potassium: 4.5 (ref 3.4–5.3)
Sodium: 130 — AB (ref 137–147)

## 2020-09-12 LAB — COMPREHENSIVE METABOLIC PANEL
Albumin: 4.1 (ref 3.5–5.0)
Calcium: 9 (ref 8.7–10.7)

## 2020-09-12 LAB — HEPATIC FUNCTION PANEL
ALT: 10 (ref 7–35)
AST: 19 (ref 13–35)
Alkaline Phosphatase: 74 (ref 25–125)
Bilirubin, Total: 0.5

## 2020-09-12 LAB — CBC: RBC: 4.01 (ref 3.87–5.11)

## 2020-09-12 LAB — TSH: TSH: 9.067 u[IU]/mL — ABNORMAL HIGH (ref 0.350–4.500)

## 2020-09-13 LAB — T4: T4, Total: 8.5 ug/dL (ref 4.5–12.0)

## 2020-09-14 ENCOUNTER — Inpatient Hospital Stay: Payer: Medicare Other

## 2020-09-14 ENCOUNTER — Other Ambulatory Visit: Payer: Self-pay

## 2020-09-14 VITALS — BP 165/74 | HR 72 | Temp 98.8°F | Resp 22

## 2020-09-14 DIAGNOSIS — Z79899 Other long term (current) drug therapy: Secondary | ICD-10-CM | POA: Diagnosis not present

## 2020-09-14 DIAGNOSIS — J449 Chronic obstructive pulmonary disease, unspecified: Secondary | ICD-10-CM | POA: Diagnosis not present

## 2020-09-14 DIAGNOSIS — D241 Benign neoplasm of right breast: Secondary | ICD-10-CM | POA: Diagnosis not present

## 2020-09-14 DIAGNOSIS — E871 Hypo-osmolality and hyponatremia: Secondary | ICD-10-CM | POA: Diagnosis not present

## 2020-09-14 DIAGNOSIS — C7951 Secondary malignant neoplasm of bone: Secondary | ICD-10-CM | POA: Diagnosis not present

## 2020-09-14 DIAGNOSIS — Z9981 Dependence on supplemental oxygen: Secondary | ICD-10-CM | POA: Diagnosis not present

## 2020-09-14 DIAGNOSIS — C3411 Malignant neoplasm of upper lobe, right bronchus or lung: Secondary | ICD-10-CM | POA: Diagnosis not present

## 2020-09-14 DIAGNOSIS — Z5112 Encounter for antineoplastic immunotherapy: Secondary | ICD-10-CM | POA: Diagnosis not present

## 2020-09-14 DIAGNOSIS — E039 Hypothyroidism, unspecified: Secondary | ICD-10-CM | POA: Diagnosis not present

## 2020-09-14 DIAGNOSIS — Z86711 Personal history of pulmonary embolism: Secondary | ICD-10-CM | POA: Diagnosis not present

## 2020-09-14 DIAGNOSIS — Z7901 Long term (current) use of anticoagulants: Secondary | ICD-10-CM | POA: Diagnosis not present

## 2020-09-14 DIAGNOSIS — E222 Syndrome of inappropriate secretion of antidiuretic hormone: Secondary | ICD-10-CM

## 2020-09-14 DIAGNOSIS — Z85038 Personal history of other malignant neoplasm of large intestine: Secondary | ICD-10-CM | POA: Diagnosis not present

## 2020-09-14 DIAGNOSIS — C3432 Malignant neoplasm of lower lobe, left bronchus or lung: Secondary | ICD-10-CM

## 2020-09-14 MED ORDER — HEPARIN SOD (PORK) LOCK FLUSH 100 UNIT/ML IV SOLN
500.0000 [IU] | Freq: Once | INTRAVENOUS | Status: AC | PRN
  Administered 2020-09-14: 500 [IU]
  Filled 2020-09-14: qty 5

## 2020-09-14 MED ORDER — SODIUM CHLORIDE 0.9 % IV SOLN
240.0000 mg | Freq: Once | INTRAVENOUS | Status: AC
  Administered 2020-09-14: 240 mg via INTRAVENOUS
  Filled 2020-09-14: qty 24

## 2020-09-14 MED ORDER — SODIUM CHLORIDE 0.9% FLUSH
10.0000 mL | INTRAVENOUS | Status: DC | PRN
  Administered 2020-09-14: 10 mL
  Filled 2020-09-14: qty 10

## 2020-09-14 MED ORDER — SODIUM CHLORIDE 0.9 % IV SOLN
Freq: Once | INTRAVENOUS | Status: AC
  Filled 2020-09-14: qty 250

## 2020-09-14 NOTE — Patient Instructions (Signed)
[facility]  Discharge Instructions: Thank you for choosing [facility] to provide your oncology and hematology care.  If you have a lab appointment with the [facility], please go directly to the [facility] and check in at the registration area.   Wear comfortable clothing and clothing appropriate for easy access to any Portacath or PICC line.   We strive to give you quality time with your provider. You may need to reschedule your appointment if you arrive late (15 or more minutes).  Arriving late affects you and other patients whose appointments are after yours.  Also, if you miss three or more appointments without notifying the office, you may be dismissed from the clinic at the provider's discretion.      For prescription refill requests, have your pharmacy contact our office and allow 72 hours for refills to be completed.    Today you received the following chemotherapy and/or immunotherapy agents nivolumab   To help prevent nausea and vomiting after your treatment, we encourage you to take your nausea medication as directed.  BELOW ARE SYMPTOMS THAT SHOULD BE REPORTED IMMEDIATELY: *FEVER GREATER THAN 100.4 F (38 C) OR HIGHER *CHILLS OR SWEATING *NAUSEA AND VOMITING THAT IS NOT CONTROLLED WITH YOUR NAUSEA MEDICATION *UNUSUAL SHORTNESS OF BREATH *UNUSUAL BRUISING OR BLEEDING *URINARY PROBLEMS (pain or burning when urinating, or frequent urination) *BOWEL PROBLEMS (unusual diarrhea, constipation, pain near the anus) TENDERNESS IN MOUTH AND THROAT WITH OR WITHOUT PRESENCE OF ULCERS (sore throat, sores in mouth, or a toothache) UNUSUAL RASH, SWELLING OR PAIN  UNUSUAL VAGINAL DISCHARGE OR ITCHING   Items with * indicate a potential emergency and should be followed up as soon as possible or go to the Emergency Department if any problems should occur.  Please show the CHEMOTHERAPY ALERT CARD or IMMUNOTHERAPY ALERT CARD at check-in to the  Emergency Department and triage nurse.  Should you have questions after your visit or need to cancel or reschedule your appointment, please contact [facility]  Dept: [phone]  and follow the prompts.  Office hours are 8:00 a.m. to 4:30 p.m. Monday - Friday. Please note that voicemails left after 4:00 p.m. may not be returned until the following business day.  We are closed weekends and major holidays. You have access to a nurse at all times for urgent questions. Please call the main number to the clinic Dept: [phone] and follow the prompts.  For any non-urgent questions, you may also contact your provider using MyChart. We now offer e-Visits for anyone 68 and older to request care online for non-urgent symptoms. For details visit [email or web address].   Also download the MyChart app! Go to the app store, search "MyChart", open the app, select [facility], and log in with your MyChart username and password.  Due to Covid, a mask is required upon entering the hospital/clinic. If you do not have a mask, one will be given to you upon arrival. For doctor visits, patients may have 1 support person aged 48 or older with them. For treatment visits, patients cannot have anyone with them due to current Covid guidelines and our immunocompromised population.   Nivolumab injection What is this medication? NIVOLUMAB (nye VOL ue mab) is a monoclonal antibody. It treats certain types of cancer. Some of the cancers treated are colon cancer, head and neck cancer,Hodgkin lymphoma, lung cancer, and melanoma. This medicine may be used for other purposes; ask your health care provider orpharmacist if you  have questions. COMMON BRAND NAME(S): Opdivo What should I tell my care team before I take this medication? They need to know if you have any of these conditions: autoimmune diseases like Crohn's disease, ulcerative colitis, or lupus have had or planning to have an allogeneic stem  cell transplant (uses someone else's stem cells) history of chest radiation history of organ transplant nervous system problems like myasthenia gravis or Guillain-Barre syndrome an unusual or allergic reaction to nivolumab, other medicines, foods, dyes, or preservatives pregnant or trying to get pregnant breast-feeding How should I use this medication? This medicine is for infusion into a vein. It is given by a health careprofessional in a hospital or clinic setting. A special MedGuide will be given to you before each treatment. Be sure to readthis information carefully each time. Talk to your pediatrician regarding the use of this medicine in children. While this drug may be prescribed for children as young as 12 years for selectedconditions, precautions do apply. Overdosage: If you think you have taken too much of this medicine contact apoison control center or emergency room at once. NOTE: This medicine is only for you. Do not share this medicine with others. What if I miss a dose? It is important not to miss your dose. Call your doctor or health careprofessional if you are unable to keep an appointment. What may interact with this medication? Interactions have not been studied. This list may not describe all possible interactions. Give your health care provider a list of all the medicines, herbs, non-prescription drugs, or dietary supplements you use. Also tell them if you smoke, drink alcohol, or use illegaldrugs. Some items may interact with your medicine. What should I watch for while using this medication? This drug may make you feel generally unwell. Continue your course of treatmenteven though you feel ill unless your doctor tells you to stop. You may need blood work done while you are taking this medicine. Do not become pregnant while taking this medicine or for 5 months after stopping it. Women should inform their doctor if they wish to become pregnant or think they might be  pregnant. There is a potential for serious side effects to an unborn child. Talk to your health care professional or pharmacist for more information. Do not breast-feed an infant while taking this medicine orfor 5 months after stopping it. What side effects may I notice from receiving this medication? Side effects that you should report to your doctor or health care professionalas soon as possible: allergic reactions like skin rash, itching or hives, swelling of the face, lips, or tongue breathing problems blood in the urine bloody or watery diarrhea or black, tarry stools changes in emotions or moods changes in vision chest pain cough dizziness feeling faint or lightheaded, falls fever, chills headache with fever, neck stiffness, confusion, loss of memory, sensitivity to light, hallucination, loss of contact with reality, or seizures joint pain mouth sores redness, blistering, peeling or loosening of the skin, including inside the mouth severe muscle pain or weakness signs and symptoms of high blood sugar such as dizziness; dry mouth; dry skin; fruity breath; nausea; stomach pain; increased hunger or thirst; increased urination signs and symptoms of kidney injury like trouble passing urine or change in the amount of urine signs and symptoms of liver injury like dark yellow or brown urine; general ill feeling or flu-like symptoms; light-colored stools; loss of appetite; nausea; right upper belly pain; unusually weak or tired; yellowing of the eyes or skin swelling of  the ankles, feet, hands trouble passing urine or change in the amount of urine unusually weak or tired weight gain or loss Side effects that usually do not require medical attention (report to yourdoctor or health care professional if they continue or are bothersome): bone pain constipation decreased appetite diarrhea muscle pain nausea, vomiting tiredness This list may not describe all possible side effects. Call your  doctor for medical advice about side effects. You may report side effects to FDA at1-800-FDA-1088. Where should I keep my medication? This drug is given in a hospital or clinic and will not be stored at home. NOTE: This sheet is a summary. It may not cover all possible information. If you have questions about this medicine, talk to your doctor, pharmacist, orhealth care provider.  2022 Elsevier/Gold Standard (2019-07-14 10:08:25)

## 2020-09-15 ENCOUNTER — Telehealth: Payer: Self-pay | Admitting: Oncology

## 2020-09-15 NOTE — Progress Notes (Signed)
Sent in request for DOS 09/27/2020 and 09/28/2020, to [employer].

## 2020-09-15 NOTE — Telephone Encounter (Signed)
09/15/20 appts schedule and given to patient

## 2020-09-17 DIAGNOSIS — J45998 Other asthma: Secondary | ICD-10-CM | POA: Diagnosis not present

## 2020-09-22 ENCOUNTER — Telehealth: Payer: Self-pay | Admitting: Hematology and Oncology

## 2020-09-22 NOTE — Telephone Encounter (Signed)
Patient rescheduled 7/6 Labs, Follow Up Dr Hinton Rao to afternoon Appt's w/[facility]

## 2020-09-27 ENCOUNTER — Inpatient Hospital Stay: Payer: Medicare Other

## 2020-09-27 ENCOUNTER — Other Ambulatory Visit: Payer: Self-pay | Admitting: Pharmacist

## 2020-09-27 ENCOUNTER — Inpatient Hospital Stay: Payer: Medicare Other | Admitting: Oncology

## 2020-09-27 ENCOUNTER — Inpatient Hospital Stay: Payer: Medicare Other | Admitting: Hematology and Oncology

## 2020-09-27 NOTE — Progress Notes (Signed)
Sent in transportation request for 07/07, and 07/08 to [employer].

## 2020-09-27 NOTE — Progress Notes (Deleted)
[place]  [address] 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 female with a history of stage IIB non-small-cell lung cancer of the right upper lobe diagnosed in November 2010.  She had a large endobronchial component causing obstruction, so underwent laser therapy at [facility] with a good response.  Due to severe emphysema and the location of the tumor being within 2 cm of the carina, she was not a surgical candidate.  She was treated with concurrent radiation and chemotherapy with carboplatin and paclitaxel, followed by an additional 2 months of carboplatin and paclitaxel with a good response.  She had bilateral pneumonia in December 2012.  CT scan at that time revealed some abnormality concerning for recurrence, so a PET scan was obtained in January 2013.  There was mild hypermetabolic activity in the right hilum, but the patient opted for observation.  Repeat CT scans had been stable, with scarring of the posterior right upper lobe.  She also has a history of a stage I adenocarcinoma of the colon, arising from a tubulovillous adenoma.  Her colonoscopy in August 2012 had removal of multiple benign polyps.  Her last mammogram was in December 2017.  She had her Port-A-Cath removed in May 2018. She quit smoking about 11 years ago.  CT chest in December 2018 revealed progression of amorphous soft tissue attenuation of the right hilum.  There was no definite lymphadenopathy.  There were stable scattered tiny pulmonary nodules all less than 5 mm.  There is evidence of emphysema.  There is a stable right liver lesion measuring 2.5  cm which was unchanged for over 5 years.  CT chest in March of 2019 and September of 2019 were stable, with soft tissue attenuation is in the area of previous radiation.  CT chest in October of 2020 revealed all previously noted pulmonary nodules measuring  4 mm or less in size are stable compared to the prior examination, and are considered definitively benign. She also underwent bilateral diagnostic mammogram and right breast ultrasound which revealed the likely benign mass in the right breast at 2:30 is stable. CEA had decreased from 4.6 to 4.4.     She was referred back from [facility] in October 2021 with a diagnosis of a new small cell lung cancer.  She presented with hemoptysis and pain of the left lower thorax.  CT revealed tumor encasing and narrowing the left lower lobe pulmonary artery and the left lower lobe bronchus.  There is a mass of the left hilum measuring 5.2 cm with left lower lobe atelectasis.  She also has moderate emphysema and mild cardiomegaly, with evidence of coronary artery disease.  Biopsy revealed small cell carcinoma with crush artifact.  She had associated hyponatremia as low as 125, and required demeclocycline.  She was also placed on prednisone, 40 mg daily for 5 days, then 20 mg daily.  She complains of occasional cough productive of creamy colored sputum and occasional wheezing but no further hemoptysis.  She is using a lidoderm patch for her left sided chest pain with partial relief.  She had been hospitalized several times  in August of this year for hyponatremia before her new lung cancer was diagnosed.  In retrospect, the left hilar cancer was not obvious on chest x-ray.  She was found to be iron deficient and her potassium was down to 3.0.  Baseline CEA was 14.1.  She received 3 cycles of carboplatin/etoposide and has problems with hypomagnesemia  Her CEA had increased in November to 48.3 from 14.1 in October, so we were concerned she may not be responding.   After 3 cycles of carboplatin/etoposide, she was hospitalized with severe weakness and diarrhea.  She was found to have severe neutropenia, which worsened during hospitalization to as low as 0.5 with an ANC of 80, despite Neulasta.  Her white blood count finally improved to 1.5 with an [place] of 650.  Her hemoglobin was down to 7 and her platelets steadily decreased from 140,000 at admission down to 17,000.  She denied bleeding, but did have severe bruising.  She received platelets and PRBC's during hospitalization.  Her platelets were 50,000 on discharge.  She also had a UTI with Klebsiella pneumoniae, which was treated with IV ceftrixone.  CT  chest in the emergency department revealed extensive bilateral lower lobe segmental pulmonary emboli almost occlusive in the left lower lobe, so she was placed on heparin.  She was discharged In January on apixaban 5 mg twice a day.  We reviewed the previous imaging reports and there was good shrinkage of the tumor.   Her blood counts recovered, so we wanted to give a 4th cycle of chemotherapy with reduction in doses prior to placing her on observation.  We were concerned that she may not be able to tolerate further chemotherapy. She received a 4th cycle of carboplatin/etoposide beginning January 31st with a 25% dose reduction and tolerated it well.   Her CEA was fairly stable at 42.5 in January, and was down to 22.5 in early February.   Repeat imaging in March, unfortunately, revealed new osseous metastasis.  There were radiation changes in the right lung, but no obvious progressive disease in the lung. Due to the bone metastasis, she was placed on palliative immunotherapy with pembrolizumab every 2 weeks, as well as zoledronic acid every 4 weeks.  She received her 1st cycle of nivolumab along with zoledronic acid on March 17th. She developed severe hypocalcemia after zoledronic acid requiring IV and resuming oral supplementation.  We therefore planned to delay zoledronic  acid until May. She has continued nivolumab every 2 weeks without difficulty. The CEA increased up to 30 in May.  INTERVAL HISTORY:  [name] is here today to review recent CT imaging prior to continuing nivolumab.  She states she has been doing fairly well.  She reports chronic dyspnea with exertion which is stable.  She reports an occasional nonproductive cough, which is improved from previous. She is using her oxygen regularly and continues to use a scooter to get around.  She denies diarrhea.  She denies skin rashes.  She denies fevers or chills. She denies pain. Her appetite is good. Her weight has increased 5 pounds over last 2 weeks .   REVIEW OF SYSTEMS:  Review of Systems  Constitutional:  Negative for appetite change, chills, fatigue, fever and unexpected weight change.  HENT:   Negative for lump/mass, mouth sores and sore throat.   Respiratory:  Positive for cough (Occasional, nonproductive, improved from previous) and shortness of breath (Chronic, stable dyspnea with exertion).   Cardiovascular:  Negative for chest pain and leg swelling.  Gastrointestinal:  Negative for abdominal pain, constipation, diarrhea, nausea and vomiting.  Endocrine: Negative for hot flashes.  Genitourinary:  Negative for difficulty urinating, dysuria, frequency and hematuria.   Musculoskeletal:  Negative for arthralgias, back pain and myalgias.  Skin:  Negative for rash.  Neurological:  Negative for dizziness and headaches.  Hematological:  Negative for adenopathy. Does not bruise/bleed easily.  Psychiatric/Behavioral:  Negative for depression and sleep disturbance. The patient is not nervous/anxious.   VITALS:  There were no vitals taken for this visit.  Wt Readings from Last 3 Encounters:  09/12/20 235 lb 0.6 oz (106.6 kg)  08/30/20 233 lb 12.8 oz (106.1 kg)  08/17/20 228 lb 12 oz (103.8 kg)    There is no height or weight on file to calculate BMI.  Performance status (ECOG): 2 - Symptomatic, <50%  confined to bed  PHYSICAL EXAM:  Physical Exam Vitals and nursing note reviewed.  Constitutional:      General: She is not in acute distress.    Appearance: Normal appearance.  HENT:     Head: Normocephalic and atraumatic.     Mouth/Throat:     Mouth: Mucous membranes are moist.     Pharynx: Oropharynx is clear. No oropharyngeal exudate or posterior oropharyngeal erythema.  Eyes:     General: No scleral icterus.    Extraocular Movements: Extraocular movements intact.     Conjunctiva/sclera: Conjunctivae normal.     Pupils: Pupils are equal, round, and reactive to light.  Cardiovascular:     Rate and Rhythm: Normal rate and regular rhythm.     Heart sounds: Normal heart sounds. No murmur heard.   No friction rub. No gallop.  Pulmonary:     Effort: Pulmonary effort is normal.     Breath sounds: Normal breath sounds. No wheezing, rhonchi or rales.  Chest:  Breasts:    Right: No axillary adenopathy or supraclavicular adenopathy.     Left: No axillary adenopathy or supraclavicular adenopathy.  Abdominal:     General: There is no distension.     Palpations: Abdomen is soft. There is no hepatomegaly, splenomegaly or mass.     Tenderness: There is no abdominal tenderness.  Musculoskeletal:        General: Normal range of motion.     Cervical back: Normal range of motion and neck supple. No tenderness.     Right lower leg: No edema.     Left lower leg: No edema.  Lymphadenopathy:     Cervical: No cervical adenopathy.     Upper Body:     Right upper body: No supraclavicular or axillary adenopathy.     Left upper body: No supraclavicular or axillary adenopathy.     Lower Body: No right inguinal adenopathy. No left inguinal adenopathy.  Skin:    General: Skin is warm and dry.     Coloration: Skin is not jaundiced.     Findings: No rash.  Neurological:     Mental Status: She is alert and oriented to person, place, and time.     Cranial Nerves: No cranial nerve deficit.   Psychiatric:        Mood and Affect: Mood normal.        Behavior: Behavior normal.        Thought Content: Thought content normal.   LABS:   CBC Latest Ref Rng & Units 09/12/2020 08/30/2020 08/15/2020  WBC - 6.2 7.2 6.4  Hemoglobin 12.0 - 16.0 11.7(A) 11.9(A) 12.0  Hematocrit 36 - 46 36 36 37  Platelets 150 - 399 246 226 228   CMP Latest Ref Rng & Units 09/12/2020 08/30/2020 08/30/2020  Glucose 70 - 99 mg/dL - 111(H) -  BUN 4 - 21 11 15 15   Creatinine 0.5 - 1.1 0.8 1.02(H) 0.9  Sodium 137 - 147 130(A) 136 132(A)  Potassium 3.4 - 5.3 4.5 4.3 4.4  Chloride 99 - 108 94(A) 97(L) 96(A)  CO2 13 - 22 29(A) 29 28(A)  Calcium 8.7 - 10.7 9.0 9.0 8.6(A)  Total Protein 6.5 - 8.1 g/dL - 6.6 -  Total Bilirubin 0.3 - 1.2 mg/dL - 0.1(L) -  Alkaline Phos 25 - 125 74 72 77  AST 13 - 35 19 13(L) 21  ALT 7 - 35 10 12 14      Lab Results  Component Value Date   CEA1 28.7 (H) 08/15/2020   /  CEA  Date Value Ref Range Status  08/15/2020 28.7 (H) 0.0 - 4.7 ng/mL Final    Comment:    (NOTE)                             Nonsmokers          <3.9                             Smokers             <5.6 [employer] Electrochemiluminescence Immunoassay (ECLIA) Values obtained with different assay methods or kits cannot be used interchangeably.  Results cannot be interpreted as absolute evidence of the presence or absence of malignant disease. Performed At: [facility] [place], [place] [id number] Rush Farmer MD LF:[id number]    No results found for: PSA1 No results found for: CAN199 No results found for: ENI778  Lab Results  Component Value Date   TOTALPROTELP 4.7 (L) 03/26/2011   ALBUMINELP 52.5 (L) 03/26/2011   A1GS 8.7 (H) 03/26/2011   A2GS 18.8 (H) 03/26/2011   BETS 4.5 (L) 03/26/2011   BETA2SER 4.7 03/26/2011   GAMS 10.8 (L) 03/26/2011   MSPIKE NOT DETECTED 03/26/2011   SPEI (NOTE) 03/26/2011   Lab Results  Component Value Date   TIBC 226 04/19/2020    TIBC 219 (L) 03/29/2011   FERRITIN 361 04/19/2020   FERRITIN 356 (H) 03/29/2011   IRONPCTSAT 44.2 04/19/2020   IRONPCTSAT 65 (H) 03/29/2011   Lab Results  Component Value Date   LDH 349 (H) 03/26/2011    STUDIES:  No results found.  Exam(s): [id number] CT/CT CHEST-ABD-PELV W/O IV CM CLINICAL DATA:  History of bilateral lung cancer. More recent diagnosis of left lung cancer with bone Mets. Remote history of right lung cancer in 2011.  EXAM: CT CHEST, ABDOMEN AND PELVIS WITHOUT CONTRAST  TECHNIQUE: Multidetector CT imaging of the chest, abdomen and pelvis was performed following the standard protocol without IV contrast.  COMPARISON:  05/29/2020  FINDINGS: CT CHEST FINDINGS  Cardiovascular: The heart is normal in size. No pericardial effusion. Stable prominent pericardial and epicardial fat. Stable advanced aortic atherosclerotic calcifications and three-vessel coronary artery calcifications.  Mediastinum/Nodes: No mediastinal or hilar mass for adenopathy. Stable marked soft tissue thickening surrounding the right mainstem bronchus along with paramediastinal radiation fibrosis. I do not see any findings suspicious for recurrent tumor. The esophagus is grossly normal.  Lungs/Pleura: Stable severe underlying emphysematous changes and areas of pulmonary scarring.  Stable dense radiation fibrosis in the right  hilar region.  Dense linear band of scarring type changes involving the left lower lobe adjacent to the major fissure. Progressive soft tissue thickening between the aorta and the left mainstem bronchus suspicious for recurrent tumor. Patient had a large mass in this area on the prior CT scan from October 2021 but I do not see any residual tumor in this area on the more recent CT scans. PET-CT may be helpful for further evaluation.  Stable small subpleural right upper lobe pulmonary nodule. No new pulmonary nodules  Musculoskeletal: No chest wall mass,  supraclavicular or axillary adenopathy.  CT ABDOMEN PELVIS FINDINGS  Hepatobiliary: Stable lesion in the right hepatic lobe posteriorly. No new lesions. The gallbladder is surgically absent. No common bile duct dilatation.  Pancreas: No mass, inflammation or ductal dilatation.  Spleen: Normal size.  No focal lesions.  Adrenals/Urinary Tract: The adrenal glands are unremarkable.  No renal lesions or renal calculi.  The bladder is grossly normal.  Stomach/Bowel: The stomach, duodenum, small bowel and colon are unremarkable. No acute inflammatory changes, mass lesions or obstructive findings. Stable sigmoid colon diverticulosis.  Vascular/Lymphatic: Stable advanced atherosclerotic calcifications involving the aorta and branch vessels but no aneurysm. No mesenteric or retroperitoneal mass or adenopathy.  Reproductive: The uterus and ovaries are unremarkable and stable.  Other: No pelvic mass or adenopathy. No free pelvic fluid collections. No inguinal mass or adenopathy. No abdominal wall hernia or subcutaneous lesions.  Musculoskeletal: Stable scattered metastatic sclerotic bone lesions.  Chronic bilateral hip AVN.  IMPRESSION: 1. Progressive soft tissue thickening between the aorta and the left mainstem bronchus suspicious for recurrent tumor. PET-CT may be helpful for further evaluation if clinically indicated. 2. Stable radiation changes involving the right hilum and right lower lobe. 3. Stable severe emphysematous changes and pulmonary scarring. 4. Stable right hepatic lobe lesion. 5. Stable scattered sclerotic bone lesions. 6. Stable advanced atherosclerotic calcifications involving the thoracic and abdominal aorta and branch vessels including the coronary arteries. 7. Chronic bilateral hip AVN. 8. Emphysema and aortic atherosclerosis.  Aortic Atherosclerosis (ICD10-I70.0) and Emphysema (ICD10-J43.9).  HISTORY:   Past Medical History:  Diagnosis Date    Anemia, unspecified    Anemia, unspecified    Cancer ([place]) 01/2009   SCC of the lung   COPD (chronic obstructive pulmonary disease) (HCC) 2-3 LPM   GERD (gastroesophageal reflux disease)    Hyposmolality syndrome    Hyposmolality syndrome    Hypothyroidism    Malignant neoplasm of overlapping sites of right lung (HCC)    Malignant neoplasm of overlapping sites of right lung (HCC)    Malignant neoplasm of sigmoid colon (HCC)    Malignant neoplasm of sigmoid colon (HCC)    Shortness of breath    Sleep apnea     Past Surgical History:  Procedure Laterality Date   COLON SURGERY     PORTACATH PLACEMENT  2010    Family History  Problem Relation Age of Onset   Colon cancer Father    Prostate cancer Father     Social History:  reports that she quit smoking about 12 years ago. Her smoking use included cigarettes. She has never used smokeless tobacco. She reports that she does not drink alcohol and does not use drugs.The patient is alone today.  Allergies:  Allergies  Allergen Reactions   Nitroglycerin In D5w Other (See Comments)    "flatlines"   Nitroglycerin Other (See Comments)    Drops her BP 'flatlines'     Current Medications: Current Outpatient Medications  Medication Sig Dispense Refill   albuterol (PROVENTIL) (5 MG/ML) 0.5% nebulizer solution Take 2.5 mg by nebulization daily.     budesonide-formoterol (SYMBICORT) 160-4.5 MCG/ACT inhaler Inhale 2 puffs into the lungs 2 (two) times daily.       calcium citrate-vitamin D (CITRACAL+D) 315-200 MG-UNIT tablet Take 1 tablet by mouth 2 (two) times daily.     Cholecalciferol (VITAMIN D3) 1.25 MG (50000 UT) CAPS      Cholecalciferol 25 MCG (1000 UT) tablet Take by mouth.     cyclobenzaprine (FLEXERIL) 10 MG tablet Take by mouth.     diltiazem (CARDIZEM CD) 120 MG 24 hr capsule Take 120 mg by mouth daily.     ELIQUIS 5 MG TABS tablet Take 1 tablet by mouth twice daily 60 tablet 2   EUTHYROX 88 MCG tablet Take 88 mcg by mouth  daily.     famotidine (PEPCID) 20 MG tablet Take 20 mg by mouth 2 (two) times daily.     fluticasone (FLONASE) 50 MCG/ACT nasal spray Place into both nostrils.     furosemide (LASIX) 20 MG tablet Take 20 mg by mouth as needed. ONLY TAKES IT HAVING SWELLING     gabapentin (NEURONTIN) 300 MG capsule Take 300 mg by mouth as needed.     guaiFENesin (MUCINEX) 600 MG 12 hr tablet Take 1 tablet by mouth every 12 (twelve) hours.     magnesium oxide (MAG-OX) 400 (241.3 Mg) MG tablet Take 1 tablet (400 mg total) by mouth daily. 30 tablet 5   meclizine (ANTIVERT) 25 MG tablet Take 0.5-1 tablets (12.5-25 mg total) by mouth 3 (three) times daily as needed for dizziness. 30 tablet 0   ondansetron (ZOFRAN) 4 MG tablet Take 1 tablet (4 mg total) by mouth every 4 (four) hours as needed for nausea. 90 tablet 3   OXYGEN Inhale 4 L into the lungs continuous.     pantoprazole (PROTONIX) 20 MG tablet Take by mouth.     Potassium Chloride ER 20 MEQ TBCR Take 1 tablet by mouth 2 (two) times daily.     prochlorperazine (COMPAZINE) 10 MG tablet Take 1 tablet (10 mg total) by mouth every 6 (six) hours as needed for nausea or vomiting. 90 tablet 3   sodium chloride 1 g tablet Take by mouth.     Vitamin D, Ergocalciferol, (DRISDOL) 1.25 MG (50000 UNIT) CAPS capsule Take 50,000 Units by mouth once a week.     No current facility-administered medications for this visit.     ASSESSMENT & PLAN:   Assessment:   1. History of stage IIB non-small cell lung cancer in November 2010.   2. History of stage I colon cancer.    3. Severe oxygen dependant COPD.   4. Small cell carcinoma of the lung, at least a clinical stage IIIB, diagnosed in October 2021.   She was initially treated with chemotherapy with carboplatin/etoposide with a good response.  However, she developed new bone metastasis with sclerotic lesions in the L2 and L4 vertebral bodies, left sacrum and left ischium, as well as mild changes of avascular necrosis in  both femoral heads.  She is now receiving palliative immunotherapy with nivolumab.  Recent CT imaging reveals progressive soft tissue thickening between the aorta and left mainstem bronchus suspicious for recurrent tumor with stable bone metastasis.  There has also been an increase in the CEA.  I will schedule her for a PET-CT for further evaluation. She is hesitant to proceed with PET as she has  difficulty lying on the hard table, but eventually agrees to this.  5.  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 She was placed on zoledronic acid in March.  She developed severe hypocalcemia with her 1st dose and we have not resumed this yet.  We will wait until we decide on her treatment going forward to resume zoledronic acid.   6. Hypocalcemia, felt to be likely due to zoledronic acid, her calcium remains normal today.   7. Chronic hyponatremia, which is stable.   Plan:   We will schedule her PET and have her see Dr. Burr Medico after that is done for further treatment recommendations. The patient understands the plans discussed today and is in agreement with them.  She knows to contact our office if she develops concerns prior to her next appointment.      [name], NP

## 2020-09-28 ENCOUNTER — Other Ambulatory Visit: Payer: Self-pay

## 2020-09-28 ENCOUNTER — Other Ambulatory Visit: Payer: Self-pay | Admitting: Hematology and Oncology

## 2020-09-28 ENCOUNTER — Inpatient Hospital Stay: Payer: Medicare Other

## 2020-09-28 ENCOUNTER — Encounter: Payer: Self-pay | Admitting: Hematology and Oncology

## 2020-09-28 ENCOUNTER — Telehealth: Payer: Self-pay | Admitting: Oncology

## 2020-09-28 ENCOUNTER — Inpatient Hospital Stay: Payer: Medicare Other | Attending: Hematology and Oncology | Admitting: Hematology and Oncology

## 2020-09-28 VITALS — BP 135/73 | HR 77 | Temp 98.6°F | Resp 18 | Ht 63.0 in | Wt 231.5 lb

## 2020-09-28 DIAGNOSIS — Z7901 Long term (current) use of anticoagulants: Secondary | ICD-10-CM | POA: Insufficient documentation

## 2020-09-28 DIAGNOSIS — C7951 Secondary malignant neoplasm of bone: Secondary | ICD-10-CM | POA: Diagnosis not present

## 2020-09-28 DIAGNOSIS — E871 Hypo-osmolality and hyponatremia: Secondary | ICD-10-CM | POA: Insufficient documentation

## 2020-09-28 DIAGNOSIS — C7952 Secondary malignant neoplasm of bone marrow: Secondary | ICD-10-CM | POA: Diagnosis not present

## 2020-09-28 DIAGNOSIS — D241 Benign neoplasm of right breast: Secondary | ICD-10-CM | POA: Diagnosis not present

## 2020-09-28 DIAGNOSIS — Z85038 Personal history of other malignant neoplasm of large intestine: Secondary | ICD-10-CM | POA: Insufficient documentation

## 2020-09-28 DIAGNOSIS — Z79899 Other long term (current) drug therapy: Secondary | ICD-10-CM | POA: Insufficient documentation

## 2020-09-28 DIAGNOSIS — E222 Syndrome of inappropriate secretion of antidiuretic hormone: Secondary | ICD-10-CM

## 2020-09-28 DIAGNOSIS — C3411 Malignant neoplasm of upper lobe, right bronchus or lung: Secondary | ICD-10-CM | POA: Diagnosis not present

## 2020-09-28 DIAGNOSIS — Z86711 Personal history of pulmonary embolism: Secondary | ICD-10-CM | POA: Insufficient documentation

## 2020-09-28 DIAGNOSIS — C3432 Malignant neoplasm of lower lobe, left bronchus or lung: Secondary | ICD-10-CM | POA: Diagnosis not present

## 2020-09-28 DIAGNOSIS — Z5112 Encounter for antineoplastic immunotherapy: Secondary | ICD-10-CM | POA: Diagnosis not present

## 2020-09-28 DIAGNOSIS — E039 Hypothyroidism, unspecified: Secondary | ICD-10-CM | POA: Diagnosis not present

## 2020-09-28 DIAGNOSIS — J449 Chronic obstructive pulmonary disease, unspecified: Secondary | ICD-10-CM | POA: Insufficient documentation

## 2020-09-28 DIAGNOSIS — Z9981 Dependence on supplemental oxygen: Secondary | ICD-10-CM | POA: Insufficient documentation

## 2020-09-28 LAB — BASIC METABOLIC PANEL
BUN: 19 (ref 4–21)
CO2: 26 — AB (ref 13–22)
Chloride: 93 — AB (ref 99–108)
Creatinine: 0.8 (ref 0.5–1.1)
Glucose: 119
Potassium: 4.1 (ref 3.4–5.3)
Sodium: 129 — AB (ref 137–147)

## 2020-09-28 LAB — CBC AND DIFFERENTIAL
HCT: 35 — AB (ref 36–46)
Hemoglobin: 11.9 — AB (ref 12.0–16.0)
Neutrophils Absolute: 2.81
Platelets: 218 (ref 150–399)
WBC: 4.6

## 2020-09-28 LAB — HEPATIC FUNCTION PANEL
ALT: 12 (ref 7–35)
AST: 18 (ref 13–35)
Alkaline Phosphatase: 68 (ref 25–125)
Bilirubin, Total: 0.4

## 2020-09-28 LAB — COMPREHENSIVE METABOLIC PANEL
Albumin: 4.3 (ref 3.5–5.0)
Calcium: 8.5 — AB (ref 8.7–10.7)

## 2020-09-28 LAB — TSH: TSH: 5.829 u[IU]/mL — ABNORMAL HIGH (ref 0.350–4.500)

## 2020-09-28 LAB — CBC: RBC: 4 (ref 3.87–5.11)

## 2020-09-28 NOTE — Telephone Encounter (Signed)
Per 7/7 los next appt scheduled and given to patient

## 2020-09-28 NOTE — Progress Notes (Addendum)
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 female with a history of stage IIB non-small-cell lung cancer of the right upper lobe diagnosed in November 2010.  She had a large endobronchial component causing obstruction, so underwent laser therapy at [facility] with a good response.  Due to severe emphysema and the location of the tumor being within 2 cm of the carina, she was not a surgical candidate.  She was treated with concurrent radiation and chemotherapy with carboplatin and paclitaxel, followed by an additional 2 months of carboplatin and paclitaxel with a good response.  She had bilateral pneumonia in December 2012.  CT scan at that time revealed some abnormality concerning for recurrence, so a PET scan was obtained in January 2013.  There was mild hypermetabolic activity in the right hilum, but the patient opted for observation.  Repeat CT scans had been stable, with scarring of the posterior right upper lobe.  She also has a history of a stage I adenocarcinoma of the colon, arising from a tubulovillous adenoma.  Her colonoscopy in August 2012 had removal of multiple benign polyps.  Her last mammogram was in December 2017.  She had her Port-A-Cath removed in May 2018. She quit smoking about 11 years ago.  CT chest in December 2018 revealed progression of amorphous soft tissue attenuation of the right hilum.  There was no definite lymphadenopathy.  There were stable scattered tiny pulmonary nodules all less than 5 mm.  There is evidence of emphysema.  There is a stable right liver lesion measuring 2.5 cm which was unchanged for over 5 years.  CT chest in  March of 2019 and September of 2019 were stable, with soft tissue attenuation is in the area of previous radiation.  CT chest in October of 2020 revealed all previously noted pulmonary nodules measuring  4 mm or less in size are stable compared to the prior examination, and are considered definitively benign. She also underwent bilateral diagnostic mammogram and right breast ultrasound which revealed the likely benign mass in the right breast at 2:30 is stable. CEA had decreased from 4.6 to 4.4.     She was referred back from [facility] in October 2021 with a diagnosis of a new small cell lung cancer.  She presented with hemoptysis and pain of the left lower thorax.  CT revealed tumor encasing and narrowing the left lower lobe pulmonary artery and the left lower lobe bronchus.  There is a mass of the left hilum measuring 5.2 cm with left lower lobe atelectasis.  She also has moderate emphysema and mild cardiomegaly, with evidence of coronary artery disease.  Biopsy revealed small cell carcinoma with crush artifact.  She had associated hyponatremia as low as 125, and required demeclocycline.  She was also placed on prednisone, 40 mg daily for 5 days, then 20 mg daily.  She complains of occasional cough productive of creamy colored sputum and occasional wheezing but no further hemoptysis.  She is using a lidoderm patch for her left sided chest pain with partial relief.  She had been hospitalized several times in August of this year for hyponatremia before her  new lung cancer was diagnosed.  In retrospect, the left hilar cancer was not obvious on chest x-ray.  She was found to be iron deficient and her potassium was down to 3.0.  Baseline CEA was 14.1.  She received 3 cycles of carboplatin/etoposide and has problems with hypomagnesemia  Her CEA had increased in November to 48.3 from 14.1 in October, so we were concerned she may not be responding.  After 3 cycles of carboplatin/etoposide, she was  hospitalized with severe weakness and diarrhea.  She was found to have severe neutropenia, which worsened during hospitalization to as low as 0.5 with an ANC of 80, despite Neulasta.  Her white blood count finally improved to 1.5 with an [place] of 650.  Her hemoglobin was down to 7 and her platelets steadily decreased from 140,000 at admission down to 17,000.  She denied bleeding, but did have severe bruising.  She received platelets and PRBC's during hospitalization.  Her platelets were 50,000 on discharge.  She also had a UTI with Klebsiella pneumoniae, which was treated with IV ceftrixone.  CT  chest in the emergency department revealed extensive bilateral lower lobe segmental pulmonary emboli almost occlusive in the left lower lobe, so she was placed on heparin.  She was discharged In January on apixaban 5 mg twice a day.  We reviewed the previous imaging reports and there was good shrinkage of the tumor.   Her blood counts recovered, so we wanted to give a 4th cycle of chemotherapy with reduction in doses prior to placing her on observation.  We were concerned that she may not be able to tolerate further chemotherapy. She received a 4th cycle of carboplatin/etoposide beginning January 31st with a 25% dose reduction and tolerated it well.   Her CEA was fairly stable at 42.5 in January, and was down to 22.5 in early February.   Repeat imaging in March, unfortunately, revealed new osseous metastasis.  There were radiation changes in the right lung, but no obvious progressive disease in the lung. Due to the bone metastasis, she was placed on palliative immunotherapy with pembrolizumab every 2 weeks, as well as zoledronic acid every 4 weeks.  She received her 1st cycle of nivolumab along with zoledronic acid on March 17th. She developed severe hypocalcemia after zoledronic acid requiring IV and resuming oral supplementation.  We therefore planned to delay zoledronic acid until May. She has continued nivolumab every 2  weeks without difficulty. The CEA increased up to 30 in May. She has had borderline normal calcium  Despite increased supplement, so we have not resumed zoledronic acid.  She underwent CT chest, abdomen and pelvis on June 6th which revealed progressive soft tissue thickening between the aorta and left mainstem bronchus suspicious for recurrent tumor.  There was no  other areas of concern.  There were multiple other stable findings.  I recommended a PET scan for further evaluation, but the patient did not go to that appointment.  She then saw Dr. Burr Medico and as the patient is adamant she will not have a PET scan, Dr. Burr Medico recommended continuing nivolumab every 2 weeks and obtaining a repeat CT in 2 months.  INTERVAL HISTORY:  Gabrielle Hudson is here today for repeat clinical assessment prior to a 9th cycle of nivolumab.  She states she has been doing fairly well.  She reports chronic dyspnea with exertion, which is stable.  She reports an occasional nonproductive cough, which is stable. She is using her oxygen regularly and continues to use a scooter  to get around.  She denies diarrhea.  She denies skin rashes.  She denies fevers or chills. She denies pain. Her appetite is good. Her weight has decreased 4 pounds over last 2 weeks .   REVIEW OF SYSTEMS:  Review of Systems  Constitutional:  Negative for appetite change, chills, fatigue, fever and unexpected weight change.  HENT:   Negative for lump/mass, mouth sores and sore throat.   Respiratory:  Positive for cough (Occasional, nonproductive, stable) and shortness of breath (Chronic, stable dyspnea with exertion).   Cardiovascular:  Negative for chest pain and leg swelling.  Gastrointestinal:  Negative for abdominal pain, constipation, diarrhea, nausea and vomiting.  Endocrine: Negative for hot flashes.  Genitourinary:  Negative for difficulty urinating, dysuria, frequency and hematuria.   Musculoskeletal:  Negative for arthralgias, back pain and myalgias.   Skin:  Negative for rash.  Neurological:  Negative for dizziness and headaches.  Hematological:  Negative for adenopathy. Does not bruise/bleed easily.  Psychiatric/Behavioral:  Negative for depression and sleep disturbance. The patient is not nervous/anxious.    VITALS:  Blood pressure 135/73, pulse 77, temperature 98.6 F (37 C), temperature source Oral, resp. rate 18, height 5\' 3"  (1.6 m), weight 231 lb 8 oz (105 kg), SpO2 99 %.  Wt Readings from Last 3 Encounters:  09/29/20 232 lb 4 oz (105.3 kg)  09/28/20 231 lb 8 oz (105 kg)  09/12/20 235 lb 0.6 oz (106.6 kg)    Body mass index is 41.01 kg/m.  Performance status (ECOG): 2 - Symptomatic, <50% confined to bed  PHYSICAL EXAM:  Physical Exam Vitals and nursing note reviewed.  Constitutional:      General: She is not in acute distress.    Appearance: Normal appearance.  HENT:     Head: Normocephalic and atraumatic.     Mouth/Throat:     Mouth: Mucous membranes are moist.     Pharynx: Oropharynx is clear. No oropharyngeal exudate or posterior oropharyngeal erythema.  Eyes:     General: No scleral icterus.    Extraocular Movements: Extraocular movements intact.     Conjunctiva/sclera: Conjunctivae normal.     Pupils: Pupils are equal, round, and reactive to light.  Cardiovascular:     Rate and Rhythm: Normal rate and regular rhythm.     Heart sounds: Normal heart sounds. No murmur heard.   No friction rub. No gallop.  Pulmonary:     Effort: Pulmonary effort is normal.     Breath sounds: Normal breath sounds. No wheezing, rhonchi or rales.  Chest:  Breasts:    Right: No axillary adenopathy or supraclavicular adenopathy.     Left: No axillary adenopathy or supraclavicular adenopathy.  Abdominal:     General: There is no distension.     Palpations: Abdomen is soft. There is no hepatomegaly, splenomegaly or mass.     Tenderness: There is no abdominal tenderness.  Musculoskeletal:        General: Normal range of motion.      Cervical back: Normal range of motion and neck supple. No tenderness.     Right lower leg: No edema.     Left lower leg: No edema.  Lymphadenopathy:     Cervical: No cervical adenopathy.     Upper Body:     Right upper body: No supraclavicular or axillary adenopathy.     Left upper body: No supraclavicular or axillary adenopathy.     Lower Body: No right inguinal adenopathy. No left inguinal adenopathy.  Skin:  General: Skin is warm and dry.     Coloration: Skin is not jaundiced.     Findings: No rash.  Neurological:     Mental Status: She is alert and oriented to person, place, and time.     Cranial Nerves: No cranial nerve deficit.  Psychiatric:        Mood and Affect: Mood normal.        Behavior: Behavior normal.        Thought Content: Thought content normal.   LABS:   CBC Latest Ref Rng & Units 09/28/2020 09/12/2020 08/30/2020  WBC - 4.6 6.2 7.2  Hemoglobin 12.0 - 16.0 11.9(A) 11.7(A) 11.9(A)  Hematocrit 36 - 46 35(A) 36 36  Platelets 150 - 399 218 246 226   CMP Latest Ref Rng & Units 09/28/2020 09/12/2020 08/30/2020  Glucose 70 - 99 mg/dL - - 111(H)  BUN 4 - 21 19 11 15   Creatinine 0.5 - 1.1 0.8 0.8 1.02(H)  Sodium 137 - 147 129(A) 130(A) 136  Potassium 3.4 - 5.3 4.1 4.5 4.3  Chloride 99 - 108 93(A) 94(A) 97(L)  CO2 13 - 22 26(A) 29(A) 29  Calcium 8.7 - 10.7 8.5(A) 9.0 9.0  Total Protein 6.5 - 8.1 g/dL - - 6.6  Total Bilirubin 0.3 - 1.2 mg/dL - - 0.1(L)  Alkaline Phos 25 - 125 68 74 72  AST 13 - 35 18 19 13(L)  ALT 7 - 35 12 10 12     Lab Results  Component Value Date   CEA1 13.6 (H) 09/29/2020   /  CEA  Date Value Ref Range Status  09/29/2020 13.6 (H) 0.0 - 4.7 ng/mL Final    Comment:    (NOTE)                             Nonsmokers          <3.9                             Smokers             <5.6 [employer] Electrochemiluminescence Immunoassay (ECLIA) Values obtained with different assay methods or kits cannot be used interchangeably.  Results  cannot be interpreted as absolute evidence of the presence or absence of malignant disease. Performed At: [facility] [place], [place] [id number] Rush Farmer MD EH:[id number]    No results found for: PSA1 No results found for: CAN199 No results found for: ZMO294  Lab Results  Component Value Date   TOTALPROTELP 4.7 (L) 03/26/2011   ALBUMINELP 52.5 (L) 03/26/2011   A1GS 8.7 (H) 03/26/2011   A2GS 18.8 (H) 03/26/2011   BETS 4.5 (L) 03/26/2011   BETA2SER 4.7 03/26/2011   GAMS 10.8 (L) 03/26/2011   MSPIKE NOT DETECTED 03/26/2011   SPEI (NOTE) 03/26/2011   Lab Results  Component Value Date   TIBC 226 04/19/2020   TIBC 219 (L) 03/29/2011   FERRITIN 361 04/19/2020   FERRITIN 356 (H) 03/29/2011   IRONPCTSAT 44.2 04/19/2020   IRONPCTSAT 65 (H) 03/29/2011   Lab Results  Component Value Date   LDH 349 (H) 03/26/2011    STUDIES:  No results found.  HISTORY:   Past Medical History:  Diagnosis Date   Anemia, unspecified    Anemia, unspecified    Cancer ([place]) 01/2009   SCC of the lung   COPD (chronic obstructive  pulmonary disease) ([place]) 2-3 LPM   GERD (gastroesophageal reflux disease)    Hyposmolality syndrome    Hyposmolality syndrome    Hypothyroidism    Malignant neoplasm of overlapping sites of right lung (HCC)    Malignant neoplasm of overlapping sites of right lung (HCC)    Malignant neoplasm of sigmoid colon (HCC)    Malignant neoplasm of sigmoid colon (HCC)    Shortness of breath    Sleep apnea     Past Surgical History:  Procedure Laterality Date   COLON SURGERY     PORTACATH PLACEMENT  2010    Family History  Problem Relation Age of Onset   Colon cancer Father    Prostate cancer Father     Social History:  reports that she quit smoking about 12 years ago. Her smoking use included cigarettes. She has never used smokeless tobacco. She reports that she does not drink alcohol and does not use drugs.The patient is alone  today.  Allergies:  Allergies  Allergen Reactions   Nitroglycerin In D5w Other (See Comments)    "flatlines"   Nitroglycerin Other (See Comments)    Drops her BP 'flatlines'     Current Medications: Current Outpatient Medications  Medication Sig Dispense Refill   albuterol (PROVENTIL) (5 MG/ML) 0.5% nebulizer solution Take 2.5 mg by nebulization daily.     budesonide-formoterol (SYMBICORT) 160-4.5 MCG/ACT inhaler Inhale 2 puffs into the lungs 2 (two) times daily.       calcium citrate-vitamin D (CITRACAL+D) 315-200 MG-UNIT tablet Take 1 tablet by mouth 2 (two) times daily.     Cholecalciferol (VITAMIN D3) 1.25 MG (50000 UT) CAPS      Cholecalciferol 25 MCG (1000 UT) tablet Take by mouth.     cyclobenzaprine (FLEXERIL) 10 MG tablet Take by mouth.     diltiazem (CARDIZEM CD) 120 MG 24 hr capsule Take 120 mg by mouth daily.     ELIQUIS 5 MG TABS tablet Take 1 tablet by mouth twice daily 60 tablet 2   EUTHYROX 88 MCG tablet Take 88 mcg by mouth daily.     famotidine (PEPCID) 20 MG tablet Take 20 mg by mouth 2 (two) times daily.     fluticasone (FLONASE) 50 MCG/ACT nasal spray Place into both nostrils.     furosemide (LASIX) 20 MG tablet Take 20 mg by mouth as needed. ONLY TAKES IT HAVING SWELLING     gabapentin (NEURONTIN) 300 MG capsule Take 300 mg by mouth as needed.     guaiFENesin (MUCINEX) 600 MG 12 hr tablet Take 1 tablet by mouth every 12 (twelve) hours.     magnesium oxide (MAG-OX) 400 (241.3 Mg) MG tablet Take 1 tablet (400 mg total) by mouth daily. 30 tablet 5   meclizine (ANTIVERT) 25 MG tablet Take 0.5-1 tablets (12.5-25 mg total) by mouth 3 (three) times daily as needed for dizziness. 30 tablet 0   ondansetron (ZOFRAN) 4 MG tablet Take 1 tablet (4 mg total) by mouth every 4 (four) hours as needed for nausea. 90 tablet 3   OXYGEN Inhale 4 L into the lungs continuous.     pantoprazole (PROTONIX) 20 MG tablet Take by mouth.     Potassium Chloride ER 20 MEQ TBCR Take 1 tablet  by mouth 2 (two) times daily.     prochlorperazine (COMPAZINE) 10 MG tablet Take 1 tablet (10 mg total) by mouth every 6 (six) hours as needed for nausea or vomiting. 90 tablet 3   sodium chloride 1 g  tablet Take by mouth.     Vitamin D, Ergocalciferol, (DRISDOL) 1.25 MG (50000 UNIT) CAPS capsule Take 50,000 Units by mouth once a week.     No current facility-administered medications for this visit.     ASSESSMENT & PLAN:   Assessment:   1. History of stage IIB non-small cell lung cancer in November 2010.   2. History of stage I colon cancer.    3. Severe oxygen dependant COPD.   4. Small cell carcinoma of the lung, at least a clinical stage IIIB, diagnosed in October 2021.   She was initially treated with chemotherapy with carboplatin/etoposide with a good response.  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  She is now receiving palliative immunotherapy with nivolumab.  Recent CT imaging reveals progressive soft tissue thickening between the aorta and left mainstem bronchus suspicious for recurrent tumor with stable bone metastasis. The patient was unwilling to have a PET scan, so we are continuing nivolumab and plan to repeat CT imaging in 2 months. Her CEA has decreased at this time from 28.7-13.6.  5.  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 She was placed on zoledronic acid in March.  She developed severe hypocalcemia with her 1st dose and we have not resumed this yet.    6. Hypocalcemia, felt to be likely due to zoledronic acid, her calcium is decreased again today, so I recommend we continue to hold zoledronic acid.   7. Chronic hyponatremia, which is slightly worse.  8. Hypothyroidism, with mild elevation of the TSH,  which may be secondary to worsening due to nivolumab.  We will continue to monitor this.   Plan:    She will proceed with a 9th cycle of nivolumab tomorrow.  We will plan to see her back in 2 weeks with a CBC and comprehensive metabolic panel prior to a 10th cycle, after which we will repeat imaging. The patient understands the plans discussed today and is in agreement with them.  She knows to contact our office if she develops concerns prior to her next appointment.      [name], PA-C

## 2020-09-29 ENCOUNTER — Ambulatory Visit: Payer: Medicare Other

## 2020-09-29 ENCOUNTER — Other Ambulatory Visit: Payer: Self-pay

## 2020-09-29 ENCOUNTER — Inpatient Hospital Stay: Payer: Medicare Other

## 2020-09-29 VITALS — BP 145/78 | HR 86 | Temp 99.2°F | Resp 22 | Ht 63.0 in | Wt 232.2 lb

## 2020-09-29 DIAGNOSIS — C7951 Secondary malignant neoplasm of bone: Secondary | ICD-10-CM | POA: Diagnosis not present

## 2020-09-29 DIAGNOSIS — Z5112 Encounter for antineoplastic immunotherapy: Secondary | ICD-10-CM | POA: Diagnosis not present

## 2020-09-29 DIAGNOSIS — E039 Hypothyroidism, unspecified: Secondary | ICD-10-CM | POA: Diagnosis not present

## 2020-09-29 DIAGNOSIS — Z7901 Long term (current) use of anticoagulants: Secondary | ICD-10-CM | POA: Diagnosis not present

## 2020-09-29 DIAGNOSIS — Z9981 Dependence on supplemental oxygen: Secondary | ICD-10-CM | POA: Diagnosis not present

## 2020-09-29 DIAGNOSIS — Z86711 Personal history of pulmonary embolism: Secondary | ICD-10-CM | POA: Diagnosis not present

## 2020-09-29 DIAGNOSIS — E871 Hypo-osmolality and hyponatremia: Secondary | ICD-10-CM

## 2020-09-29 DIAGNOSIS — C3432 Malignant neoplasm of lower lobe, left bronchus or lung: Secondary | ICD-10-CM

## 2020-09-29 DIAGNOSIS — Z85038 Personal history of other malignant neoplasm of large intestine: Secondary | ICD-10-CM | POA: Diagnosis not present

## 2020-09-29 DIAGNOSIS — Z79899 Other long term (current) drug therapy: Secondary | ICD-10-CM | POA: Diagnosis not present

## 2020-09-29 DIAGNOSIS — C3411 Malignant neoplasm of upper lobe, right bronchus or lung: Secondary | ICD-10-CM | POA: Diagnosis not present

## 2020-09-29 DIAGNOSIS — E222 Syndrome of inappropriate secretion of antidiuretic hormone: Secondary | ICD-10-CM

## 2020-09-29 DIAGNOSIS — D241 Benign neoplasm of right breast: Secondary | ICD-10-CM | POA: Diagnosis not present

## 2020-09-29 DIAGNOSIS — J449 Chronic obstructive pulmonary disease, unspecified: Secondary | ICD-10-CM | POA: Diagnosis not present

## 2020-09-29 LAB — T4: T4, Total: 9 ug/dL (ref 4.5–12.0)

## 2020-09-29 MED ORDER — HEPARIN SOD (PORK) LOCK FLUSH 100 UNIT/ML IV SOLN
500.0000 [IU] | Freq: Once | INTRAVENOUS | Status: AC | PRN
  Administered 2020-09-29: 500 [IU]
  Filled 2020-09-29: qty 5

## 2020-09-29 MED ORDER — SODIUM CHLORIDE 0.9 % IV SOLN
Freq: Once | INTRAVENOUS | Status: AC
  Filled 2020-09-29: qty 250

## 2020-09-29 MED ORDER — SODIUM CHLORIDE 0.9 % IV SOLN
240.0000 mg | Freq: Once | INTRAVENOUS | Status: AC
  Administered 2020-09-29: 240 mg via INTRAVENOUS
  Filled 2020-09-29: qty 24

## 2020-09-29 NOTE — Progress Notes (Signed)
Sent in request for DOS 07/19, and 07/22 to [employer].

## 2020-09-29 NOTE — Patient Instructions (Signed)
Gabrielle Hudson  Discharge Instructions: Thank you for choosing [facility] to provide your oncology and hematology care.  If you have a lab appointment with the [facility], please go directly to the [facility] and check in at the registration area.   Wear comfortable clothing and clothing appropriate for easy access to any Portacath or PICC line.   We strive to give you quality time with your provider. You may need to reschedule your appointment if you arrive late (15 or more minutes).  Arriving late affects you and other patients whose appointments are after yours.  Also, if you miss three or more appointments without notifying the office, you may be dismissed from the clinic at the provider's discretion.      For prescription refill requests, have your pharmacy contact our office and allow 72 hours for refills to be completed.    Today you received the following chemotherapy and/or immunotherapy agents Nivolumab     To help prevent nausea and vomiting after your treatment, we encourage you to take your nausea medication as directed.  BELOW ARE SYMPTOMS THAT SHOULD BE REPORTED IMMEDIATELY: *FEVER GREATER THAN 100.4 F (38 C) OR HIGHER *CHILLS OR SWEATING *NAUSEA AND VOMITING THAT IS NOT CONTROLLED WITH YOUR NAUSEA MEDICATION *UNUSUAL SHORTNESS OF BREATH *UNUSUAL BRUISING OR BLEEDING *URINARY PROBLEMS (pain or burning when urinating, or frequent urination) *BOWEL PROBLEMS (unusual diarrhea, constipation, pain near the anus) TENDERNESS IN MOUTH AND THROAT WITH OR WITHOUT PRESENCE OF ULCERS (sore throat, sores in mouth, or a toothache) UNUSUAL RASH, SWELLING OR PAIN  UNUSUAL VAGINAL DISCHARGE OR ITCHING   Items with * indicate a potential emergency and should be followed up as soon as possible or go to the Emergency Department if any problems should occur.  Please show the CHEMOTHERAPY ALERT CARD or IMMUNOTHERAPY ALERT CARD at check-in to the  Emergency Department and triage nurse.  Should you have questions after your visit or need to cancel or reschedule your appointment, please contact [facility]  Dept: [phone]  and follow the prompts.  Office hours are 8:00 a.m. to 4:30 p.m. Monday - Friday. Please note that voicemails left after 4:00 p.m. may not be returned until the following business day.  We are closed weekends and major holidays. You have access to a nurse at all times for urgent questions. Please call the main number to the clinic Dept: [phone] and follow the prompts.  For any non-urgent questions, you may also contact your provider using MyChart. We now offer e-Visits for anyone 37 and older to request care online for non-urgent symptoms. For details visit [email or web address].   Also download the MyChart app! Go to the app store, search "MyChart", open the app, select [facility], and log in with your MyChart username and password.  Due to Covid, a mask is required upon entering the hospital/clinic. If you do not have a mask, one will be given to you upon arrival. For doctor visits, patients may have 1 support person aged 61 or older with them. For treatment visits, patients cannot have anyone with them due to current Covid guidelines and our immunocompromised population.

## 2020-09-29 NOTE — Progress Notes (Signed)
1551: PT STABLE AT TIME OF DISCHARGE

## 2020-09-30 LAB — CEA: CEA: 13.6 ng/mL — ABNORMAL HIGH (ref 0.0–4.7)

## 2020-10-02 ENCOUNTER — Encounter: Payer: Self-pay | Admitting: Oncology

## 2020-10-03 ENCOUNTER — Telehealth: Payer: Self-pay

## 2020-10-03 NOTE — Telephone Encounter (Signed)
-----   Message from [name], PA-C sent at 10/02/2020  9:19 AM EDT ----- Please let her know her cancer marker is down some to 13.6 was 29.7, which is good. Thanks

## 2020-10-03 NOTE — Telephone Encounter (Signed)
Patient notified

## 2020-10-05 NOTE — Progress Notes (Signed)
[facility]  [address] [place],  [place]  [address] [phone]  Clinic Day:  10/10/2020  Referring physician: Jeanie Sewer, NP  This document serves as a record of services personally performed by Hosie Poisson, MD. It was created on their behalf by Curry,Lauren E, a trained medical scribe. The creation of this record is based on the scribe's personal observations and the provider's statements to them.  CHIEF COMPLAINT:  CC:  Metastatic small cell lung cancer to bone  Current Treatment:    Palliative nivolumab every 2 weeks   HISTORY OF PRESENT ILLNESS:  Gabrielle Hudson is a 80 y.o. female with a history of stage IIB non-small-cell lung cancer of the right upper lobe diagnosed in November 2010.  She had a large endobronchial component causing obstruction, so underwent laser therapy at [facility] with a good response.  Due to severe emphysema and the location of the tumor being within 2 cm of the carina, she was not a surgical candidate.  She was treated with concurrent radiation and chemotherapy with carboplatin and paclitaxel, followed by an additional 2 months of carboplatin and paclitaxel with a good response.  She had bilateral pneumonia in December 2012.  CT scan at that time revealed some abnormality concerning for recurrence, so a PET scan was obtained in January 2013.  There was mild hypermetabolic activity in the right hilum, but the patient opted for observation.  Repeat CT scans had been stable, with scarring of the posterior right upper lobe.  She also has a history of a stage I adenocarcinoma of the colon, arising from a tubulovillous adenoma.  Her colonoscopy in August 2012 had removal of multiple benign polyps.  Her last mammogram was in December 2017.  She had her Port-A-Cath removed in May 2018. She quit smoking about 11 years ago.  CT chest in December 2018 revealed progression of amorphous soft tissue  attenuation of the right hilum.  There was no definite lymphadenopathy.  There were stable scattered tiny pulmonary nodules all less than 5 mm.  There is evidence of emphysema.  There is a stable right liver lesion measuring 2.5 cm which was unchanged for over 5 years.  CT chest in March of 2019 and September of 2019 were stable, with soft tissue attenuation is in the area of previous radiation.  CT chest in October of 2020 revealed all previously noted pulmonary nodules measuring  4 mm or less in size are stable compared to the prior examination, and are considered definitively benign. She also underwent bilateral diagnostic mammogram and right breast ultrasound which revealed the likely benign mass in the right breast at 2:30 is stable. CEA had decreased from 4.6 to 4.4.     She was referred back from [facility] in October 2021 with a diagnosis of a new left small cell lung cancer.  She presented with hemoptysis and pain of the left lower thorax.  CT revealed tumor encasing and narrowing the left lower lobe pulmonary artery and the left lower lobe bronchus.  There is a mass of the left hilum measuring 5.2 cm with left lower lobe atelectasis.  She also has moderate emphysema and mild cardiomegaly, with evidence of coronary artery disease.  Biopsy revealed small cell carcinoma with crush artifact.  She had associated hyponatremia as low as 125, and required demeclocycline.  She was also placed on prednisone, 40 mg daily for 5 days, then 20 mg daily.  She complains of  occasional cough productive of creamy colored sputum and occasional wheezing but no further hemoptysis.  She is using a lidoderm patch for her left sided chest pain with partial relief.  She had been hospitalized several times in August of this year for hyponatremia before her new lung cancer was diagnosed.  In retrospect, the left hilar cancer was not obvious on chest x-ray.  She was found to be iron deficient and her potassium was  down to 3.0.  Baseline CEA was 14.1.  She received 3 cycles of carboplatin/etoposide and has problems with hypomagnesemia  Her CEA had increased in November to 48.3 from 14.1 in October, so we were concerned she may not be responding.  After 3 cycles of carboplatin/etoposide, she was hospitalized with severe weakness and diarrhea.  She was found to have severe neutropenia, which worsened during hospitalization to as low as 0.5 with an ANC of 80, despite Neulasta.  Her white blood count finally improved to 1.5 with an [place] of 650.  Her hemoglobin was down to 7 and her platelets steadily decreased from 140,000 at admission down to 17,000.  She denied bleeding, but did have severe bruising.  She received platelets and PRBC's during hospitalization.  Her platelets were 50,000 on discharge.  She also had a UTI with Klebsiella pneumoniae, which was treated with IV ceftrixone.  CT  chest in the emergency department revealed extensive bilateral lower lobe segmental pulmonary emboli almost occlusive in the left lower lobe, so she was placed on heparin.  She was discharged In January on apixaban 5 mg twice a day.  We reviewed the previous imaging reports and there was good shrinkage of the tumor.   Her blood counts recovered, so we wanted to give a 4th cycle of chemotherapy with reduction in doses prior to placing her on observation.  We were concerned that she may not be able to tolerate further chemotherapy. She received a 4th cycle of carboplatin/etoposide beginning January 31st with a 25% dose reduction and tolerated it well.   Her CEA was fairly stable at 42.5 in January, and was down to 22.5 in early February.   Repeat imaging in March 2022, unfortunately, revealed new osseous metastasis.  There were radiation changes in the right lung, but no obvious progressive disease in the lung. Due to the bone metastasis, she was placed on palliative immunotherapy with nivolumab every 2 weeks, as well as zoledronic acid every 12  weeks.  She received her 1st cycle of nivolumab along with zoledronic acid on March 17th.  With her 2nd dose of zoledronic acid on June 24th she developed severe hypocalcemia requiring IV and resuming oral supplementation.  She has continued nivolumab every 2 weeks without difficulty. The CEA increased up to 30 in May, down to 28.7 in June.  She underwent CT chest, abdomen and pelvis on June 6th which revealed progressive soft tissue thickening between the aorta and left mainstem bronchus suspicious for recurrent tumor.  There was no  other areas of concern.  There were multiple other stable findings.  We recommended a PET scan for further evaluation, but the patient did not go to that appointment due to the long period of lying on her back.    INTERVAL HISTORY:  Shirley is here for repeat clinical assessment prior to a 10th cycle of nivolumab.  She notes fatigue and mild chest pain.  She has not required any pain medications.  She also has had some worsening shortness of breath with exertion despite oxygen 4L.  She continues to have intermittent productive cough with white sputum.  She also notes hiccups recently.  Blood counts and chemistries are unremarkable except for a sodium of 132, improved.  The latest CEA from July was 13.6, down from 28.7. Her  appetite is good, and she has lost 2 and 1/2 pounds since her last visit.  She denies fever, chills or other signs of infection.  She denies nausea, vomiting, bowel issues, or abdominal pain.  She denies sore throat.  REVIEW OF SYSTEMS:  Review of Systems  Constitutional:  Positive for fatigue. Negative for appetite change, chills, fever and unexpected weight change.  HENT:   Negative for lump/mass, mouth sores and sore throat.   Respiratory:  Positive for cough (Occasional, nonproductive, stable) and shortness of breath (Chronic, worsening dyspnea with exertion).   Cardiovascular:  Positive for chest pain (mild). Negative for leg swelling.   Gastrointestinal:  Negative for abdominal pain, constipation, diarrhea, nausea and vomiting.  Endocrine: Negative for hot flashes.  Genitourinary:  Negative for difficulty urinating, dysuria, frequency and hematuria.   Musculoskeletal:  Negative for arthralgias, back pain and myalgias.  Skin:  Negative for rash.  Neurological:  Negative for dizziness and headaches.  Hematological:  Negative for adenopathy. Does not bruise/bleed easily.  Psychiatric/Behavioral:  Negative for depression and sleep disturbance. The patient is not nervous/anxious.    VITALS:  Blood pressure (!) 150/68, pulse 85, temperature 98.6 F (37 C), temperature source Oral, resp. rate 20, height 5\' 3"  (1.6 m), weight 229 lb (103.9 kg), SpO2 98 %.  Wt Readings from Last 3 Encounters:  10/10/20 229 lb (103.9 kg)  09/29/20 232 lb 4 oz (105.3 kg)  09/28/20 231 lb 8 oz (105 kg)    Body mass index is 40.57 kg/m.  Performance status (ECOG): 2 - Symptomatic, <50% confined to bed  PHYSICAL EXAM:  Physical Exam Vitals and nursing note reviewed.  Constitutional:      General: She is not in acute distress.    Appearance: Normal appearance.  HENT:     Head: Normocephalic and atraumatic.     Mouth/Throat:     Mouth: Mucous membranes are moist.     Pharynx: Oropharynx is clear. No oropharyngeal exudate or posterior oropharyngeal erythema.  Eyes:     General: No scleral icterus.    Extraocular Movements: Extraocular movements intact.     Conjunctiva/sclera: Conjunctivae normal.     Pupils: Pupils are equal, round, and reactive to light.  Cardiovascular:     Rate and Rhythm: Normal rate and regular rhythm.     Heart sounds: Normal heart sounds. No murmur heard.   No friction rub. No gallop.  Pulmonary:     Effort: Pulmonary effort is normal.     Breath sounds: Decreased breath sounds (bilaterally, especially of the left base) present. No wheezing, rhonchi or rales.  Chest:  Breasts:    Right: No axillary adenopathy  or supraclavicular adenopathy.     Left: No axillary adenopathy or supraclavicular adenopathy.  Abdominal:     General: There is no distension.     Palpations: Abdomen is soft. There is no hepatomegaly, splenomegaly or mass.     Tenderness: There is no abdominal tenderness.  Musculoskeletal:        General: Normal range of motion.     Cervical back: Normal range of motion and neck supple. No tenderness.     Right lower leg: No edema.     Left lower leg: No edema.  Lymphadenopathy:  Cervical: No cervical adenopathy.     Upper Body:     Right upper body: No supraclavicular or axillary adenopathy.     Left upper body: No supraclavicular or axillary adenopathy.     Lower Body: No right inguinal adenopathy. No left inguinal adenopathy.  Skin:    General: Skin is warm and dry.     Coloration: Skin is not jaundiced.     Findings: No rash.  Neurological:     Mental Status: She is alert and oriented to person, place, and time.     Cranial Nerves: No cranial nerve deficit.  Psychiatric:        Mood and Affect: Mood normal.        Behavior: Behavior normal.        Thought Content: Thought content normal.   LABS:   CBC Latest Ref Rng & Units 10/10/2020 09/28/2020 09/12/2020  WBC - 6.4 4.6 6.2  Hemoglobin 12.0 - 16.0 12.5 11.9(A) 11.7(A)  Hematocrit 36 - 46 37 35(A) 36  Platelets 150 - 399 219 218 246   CMP Latest Ref Rng & Units 10/10/2020 09/28/2020 09/12/2020  Glucose 70 - 99 mg/dL - - -  BUN 4 - 21 19 19 11   Creatinine 0.5 - 1.1 1.0 0.8 0.8  Sodium 137 - 147 132(A) 129(A) 130(A)  Potassium 3.4 - 5.3 3.9 4.1 4.5  Chloride 99 - 108 95(A) 93(A) 94(A)  CO2 13 - 22 27(A) 26(A) 29(A)  Calcium 8.7 - 10.7 9.0 8.5(A) 9.0  Total Protein 6.5 - 8.1 g/dL - - -  Total Bilirubin 0.3 - 1.2 mg/dL - - -  Alkaline Phos 25 - 125 77 68 74  AST 13 - 35 20 18 19   ALT 7 - 35 15 12 10     Lab Results  Component Value Date   CEA1 13.6 (H) 09/29/2020   /  CEA  Date Value Ref Range Status  09/29/2020  13.6 (H) 0.0 - 4.7 ng/mL Final    Comment:    (NOTE)                             Nonsmokers          <3.9                             Smokers             <5.6 [employer] Electrochemiluminescence Immunoassay (ECLIA) Values obtained with different assay methods or kits cannot be used interchangeably.  Results cannot be interpreted as absolute evidence of the presence or absence of malignant disease. Performed At: [facility] [place], [place] [id number] Rush Farmer MD [id number]     Lab Results  Component Value Date   TOTALPROTELP 4.7 (L) 03/26/2011   ALBUMINELP 52.5 (L) 03/26/2011   A1GS 8.7 (H) 03/26/2011   A2GS 18.8 (H) 03/26/2011   BETS 4.5 (L) 03/26/2011   BETA2SER 4.7 03/26/2011   GAMS 10.8 (L) 03/26/2011   MSPIKE NOT DETECTED 03/26/2011   SPEI (NOTE) 03/26/2011   Lab Results  Component Value Date   TIBC 226 04/19/2020   TIBC 219 (L) 03/29/2011   FERRITIN 361 04/19/2020   FERRITIN 356 (H) 03/29/2011   IRONPCTSAT 44.2 04/19/2020   IRONPCTSAT 65 (H) 03/29/2011   Lab Results  Component Value Date   LDH 349 (H) 03/26/2011    STUDIES:  No  results found.  HISTORY:   Allergies:  Allergies  Allergen Reactions   Nitroglycerin In D5w Other (See Comments)    "flatlines"   Nitroglycerin Other (See Comments)    Drops her BP 'flatlines'     Current Medications: Current Outpatient Medications  Medication Sig Dispense Refill   albuterol (PROVENTIL) (5 MG/ML) 0.5% nebulizer solution Take 2.5 mg by nebulization daily.     budesonide-formoterol (SYMBICORT) 160-4.5 MCG/ACT inhaler Inhale 2 puffs into the lungs 2 (two) times daily.       calcium citrate-vitamin D (CITRACAL+D) 315-200 MG-UNIT tablet Take 1 tablet by mouth 2 (two) times daily.     Cholecalciferol (VITAMIN D3) 1.25 MG (50000 UT) CAPS      diltiazem (CARDIZEM CD) 120 MG 24 hr capsule Take 120 mg by mouth daily.     ELIQUIS 5 MG TABS tablet Take 1 tablet by mouth twice  daily 60 tablet 2   EUTHYROX 88 MCG tablet Take 88 mcg by mouth daily.     famotidine (PEPCID) 20 MG tablet Take 20 mg by mouth 2 (two) times daily.     fluticasone (FLONASE) 50 MCG/ACT nasal spray Place into both nostrils.     furosemide (LASIX) 20 MG tablet Take 20 mg by mouth as needed. ONLY TAKES IT HAVING SWELLING     gabapentin (NEURONTIN) 300 MG capsule Take 300 mg by mouth as needed.     meclizine (ANTIVERT) 25 MG tablet Take 0.5-1 tablets (12.5-25 mg total) by mouth 3 (three) times daily as needed for dizziness. 30 tablet 0   ondansetron (ZOFRAN) 4 MG tablet Take 1 tablet (4 mg total) by mouth every 4 (four) hours as needed for nausea. 90 tablet 3   OXYGEN Inhale 4 L into the lungs continuous.     pantoprazole (PROTONIX) 20 MG tablet Take by mouth.     Potassium Chloride ER 20 MEQ TBCR Take 1 tablet by mouth 2 (two) times daily.     prochlorperazine (COMPAZINE) 10 MG tablet Take 1 tablet (10 mg total) by mouth every 6 (six) hours as needed for nausea or vomiting. 90 tablet 3   Vitamin D, Ergocalciferol, (DRISDOL) 1.25 MG (50000 UNIT) CAPS capsule Take 50,000 Units by mouth once a week.     guaiFENesin (MUCINEX) 600 MG 12 hr tablet Take 1 tablet by mouth every 12 (twelve) hours. (Patient not taking: Reported on 10/10/2020)     No current facility-administered medications for this visit.   ASSESSMENT & PLAN:   Assessment:  1. History of stage IIB non-small cell lung cancer in November 2010.   2. History of stage I colon cancer.    3. Severe oxygen dependant COPD.   4. Small cell carcinoma of the lung, at least a clinical stage IIIB, diagnosed in October 2021.   She was initially treated with chemotherapy with carboplatin/etoposide with a good response.  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  She is now receiving palliative  immunotherapy with nivolumab.  Recent CT imaging reveals progressive soft tissue thickening between the aorta and left mainstem bronchus suspicious for recurrent tumor with stable bone metastasis. The patient was unwilling to have a PET scan, so we are continuing nivolumab and plan to repeat imaging in 6-8 weeks from now. Her CEA has decreased at this time from 28.7-13.6.  5.  Bone metastases at L2  and L4 vertebral bodies, left sacrum and left ischium, as well as mild changes of avascular necrosis in both femoral heads. She was placed on zoledronic acid in March.  She developed severe hypocalcemia with her 1st dose and we have not resumed this yet.    6. Hypocalcemia, felt to be likely due to zoledronic acid, her calcium is decreased again today, so I recommend we continue to hold zoledronic acid.   7. Chronic hyponatremia, which is slightly improved.  8. Hypothyroidism, with mild elevation of the TSH, which may be secondary to worsening due to nivolumab.  We will continue to monitor this.  Plan:     She will proceed with a 10th cycle of nivolumab on Friday.  We will plan to see her back in 2 weeks with a CBC and comprehensive metabolic panel prior to a 11th cycle.  Her next Zometa will be due on or after September 16th.  We will plan for PET imaging around in 6-8 weeks, in early September, as the patient said she is willing to try this again.  She has denied this previously due to the length of the imaging.  If she is unable to tolerate this, we will get CT chest.  We will continue periodic CEA.  The patient understands the plans discussed today and is in agreement with them.  She knows to contact our office if she develops concerns prior to her next appointment.   I, Rita Ohara, am acting as scribe for Derwood Kaplan, MD  I have reviewed this report as typed by the medical scribe, and it is complete and accurate.

## 2020-10-10 ENCOUNTER — Other Ambulatory Visit: Payer: Self-pay

## 2020-10-10 ENCOUNTER — Encounter: Payer: Self-pay | Admitting: Oncology

## 2020-10-10 ENCOUNTER — Inpatient Hospital Stay: Payer: Medicare Other

## 2020-10-10 ENCOUNTER — Inpatient Hospital Stay (INDEPENDENT_AMBULATORY_CARE_PROVIDER_SITE_OTHER): Payer: Medicare Other | Admitting: Oncology

## 2020-10-10 ENCOUNTER — Other Ambulatory Visit: Payer: Self-pay | Admitting: Oncology

## 2020-10-10 ENCOUNTER — Telehealth: Payer: Self-pay | Admitting: Oncology

## 2020-10-10 VITALS — BP 150/68 | HR 85 | Temp 98.6°F | Resp 20 | Ht 63.0 in | Wt 229.0 lb

## 2020-10-10 DIAGNOSIS — Z5112 Encounter for antineoplastic immunotherapy: Secondary | ICD-10-CM | POA: Diagnosis not present

## 2020-10-10 DIAGNOSIS — Z9981 Dependence on supplemental oxygen: Secondary | ICD-10-CM | POA: Diagnosis not present

## 2020-10-10 DIAGNOSIS — E871 Hypo-osmolality and hyponatremia: Secondary | ICD-10-CM

## 2020-10-10 DIAGNOSIS — Z7901 Long term (current) use of anticoagulants: Secondary | ICD-10-CM | POA: Diagnosis not present

## 2020-10-10 DIAGNOSIS — C3432 Malignant neoplasm of lower lobe, left bronchus or lung: Secondary | ICD-10-CM

## 2020-10-10 DIAGNOSIS — J449 Chronic obstructive pulmonary disease, unspecified: Secondary | ICD-10-CM | POA: Diagnosis not present

## 2020-10-10 DIAGNOSIS — D241 Benign neoplasm of right breast: Secondary | ICD-10-CM | POA: Diagnosis not present

## 2020-10-10 DIAGNOSIS — C7951 Secondary malignant neoplasm of bone: Secondary | ICD-10-CM | POA: Diagnosis not present

## 2020-10-10 DIAGNOSIS — C3411 Malignant neoplasm of upper lobe, right bronchus or lung: Secondary | ICD-10-CM | POA: Diagnosis not present

## 2020-10-10 DIAGNOSIS — Z86711 Personal history of pulmonary embolism: Secondary | ICD-10-CM | POA: Diagnosis not present

## 2020-10-10 DIAGNOSIS — E222 Syndrome of inappropriate secretion of antidiuretic hormone: Secondary | ICD-10-CM

## 2020-10-10 DIAGNOSIS — Z85038 Personal history of other malignant neoplasm of large intestine: Secondary | ICD-10-CM | POA: Diagnosis not present

## 2020-10-10 DIAGNOSIS — D649 Anemia, unspecified: Secondary | ICD-10-CM | POA: Diagnosis not present

## 2020-10-10 DIAGNOSIS — E039 Hypothyroidism, unspecified: Secondary | ICD-10-CM | POA: Diagnosis not present

## 2020-10-10 DIAGNOSIS — Z79899 Other long term (current) drug therapy: Secondary | ICD-10-CM | POA: Diagnosis not present

## 2020-10-10 LAB — TSH: TSH: 6.696 u[IU]/mL — ABNORMAL HIGH (ref 0.350–4.500)

## 2020-10-10 LAB — BASIC METABOLIC PANEL
BUN: 19 (ref 4–21)
CO2: 27 — AB (ref 13–22)
Chloride: 95 — AB (ref 99–108)
Creatinine: 1 (ref 0.5–1.1)
Glucose: 115
Potassium: 3.9 (ref 3.4–5.3)
Sodium: 132 — AB (ref 137–147)

## 2020-10-10 LAB — CBC AND DIFFERENTIAL
HCT: 37 (ref 36–46)
Hemoglobin: 12.5 (ref 12.0–16.0)
Neutrophils Absolute: 4.03
Platelets: 219 (ref 150–399)
WBC: 6.4

## 2020-10-10 LAB — COMPREHENSIVE METABOLIC PANEL
Albumin: 4.3 (ref 3.5–5.0)
Calcium: 9 (ref 8.7–10.7)

## 2020-10-10 LAB — HEPATIC FUNCTION PANEL
ALT: 15 (ref 7–35)
AST: 20 (ref 13–35)
Alkaline Phosphatase: 77 (ref 25–125)
Bilirubin, Total: 0.3

## 2020-10-10 LAB — CBC: RBC: 4.23 (ref 3.87–5.11)

## 2020-10-10 NOTE — Telephone Encounter (Signed)
Per 7/19 los next appt scheduled and given to patient

## 2020-10-11 LAB — T4: T4, Total: 9 ug/dL (ref 4.5–12.0)

## 2020-10-13 ENCOUNTER — Ambulatory Visit: Payer: Medicare Other

## 2020-10-13 ENCOUNTER — Other Ambulatory Visit: Payer: Self-pay

## 2020-10-13 ENCOUNTER — Inpatient Hospital Stay: Payer: Medicare Other

## 2020-10-13 DIAGNOSIS — D241 Benign neoplasm of right breast: Secondary | ICD-10-CM | POA: Diagnosis not present

## 2020-10-13 DIAGNOSIS — Z79899 Other long term (current) drug therapy: Secondary | ICD-10-CM | POA: Diagnosis not present

## 2020-10-13 DIAGNOSIS — Z9981 Dependence on supplemental oxygen: Secondary | ICD-10-CM | POA: Diagnosis not present

## 2020-10-13 DIAGNOSIS — E222 Syndrome of inappropriate secretion of antidiuretic hormone: Secondary | ICD-10-CM

## 2020-10-13 DIAGNOSIS — E871 Hypo-osmolality and hyponatremia: Secondary | ICD-10-CM

## 2020-10-13 DIAGNOSIS — C7951 Secondary malignant neoplasm of bone: Secondary | ICD-10-CM | POA: Diagnosis not present

## 2020-10-13 DIAGNOSIS — Z5112 Encounter for antineoplastic immunotherapy: Secondary | ICD-10-CM | POA: Diagnosis not present

## 2020-10-13 DIAGNOSIS — Z85038 Personal history of other malignant neoplasm of large intestine: Secondary | ICD-10-CM | POA: Diagnosis not present

## 2020-10-13 DIAGNOSIS — Z86711 Personal history of pulmonary embolism: Secondary | ICD-10-CM | POA: Diagnosis not present

## 2020-10-13 DIAGNOSIS — C3411 Malignant neoplasm of upper lobe, right bronchus or lung: Secondary | ICD-10-CM | POA: Diagnosis not present

## 2020-10-13 DIAGNOSIS — E039 Hypothyroidism, unspecified: Secondary | ICD-10-CM | POA: Diagnosis not present

## 2020-10-13 DIAGNOSIS — Z7901 Long term (current) use of anticoagulants: Secondary | ICD-10-CM | POA: Diagnosis not present

## 2020-10-13 DIAGNOSIS — J449 Chronic obstructive pulmonary disease, unspecified: Secondary | ICD-10-CM | POA: Diagnosis not present

## 2020-10-13 DIAGNOSIS — C3432 Malignant neoplasm of lower lobe, left bronchus or lung: Secondary | ICD-10-CM

## 2020-10-13 MED ORDER — SODIUM CHLORIDE 0.9 % IV SOLN
240.0000 mg | Freq: Once | INTRAVENOUS | Status: AC
  Administered 2020-10-13: 240 mg via INTRAVENOUS
  Filled 2020-10-13: qty 24

## 2020-10-13 MED ORDER — HEPARIN SOD (PORK) LOCK FLUSH 100 UNIT/ML IV SOLN
500.0000 [IU] | Freq: Once | INTRAVENOUS | Status: AC | PRN
  Administered 2020-10-13: 500 [IU]
  Filled 2020-10-13: qty 5

## 2020-10-13 MED ORDER — SODIUM CHLORIDE 0.9 % IV SOLN
Freq: Once | INTRAVENOUS | Status: AC
  Filled 2020-10-13: qty 250

## 2020-10-13 MED ORDER — SODIUM CHLORIDE 0.9% FLUSH
10.0000 mL | INTRAVENOUS | Status: DC | PRN
  Administered 2020-10-13: 10 mL
  Filled 2020-10-13: qty 10

## 2020-10-13 NOTE — Patient Instructions (Signed)
Nivolumab injection What is this medication? NIVOLUMAB (nye VOL ue mab) is a monoclonal antibody. It treats certain types of cancer. Some of the cancers treated are colon cancer, head and neck cancer,Hodgkin lymphoma, lung cancer, and melanoma. This medicine may be used for other purposes; ask your health care provider orpharmacist if you have questions. COMMON BRAND NAME(S): Opdivo What should I tell my care team before I take this medication? They need to know if you have any of these conditions: autoimmune diseases like Crohn's disease, ulcerative colitis, or lupus have had or planning to have an allogeneic stem cell transplant (uses someone else's stem cells) history of chest radiation history of organ transplant nervous system problems like myasthenia gravis or Guillain-Barre syndrome an unusual or allergic reaction to nivolumab, other medicines, foods, dyes, or preservatives pregnant or trying to get pregnant breast-feeding How should I use this medication? This medicine is for infusion into a vein. It is given by a health careprofessional in a hospital or clinic setting. A special MedGuide will be given to you before each treatment. Be sure to readthis information carefully each time. Talk to your pediatrician regarding the use of this medicine in children. While this drug may be prescribed for children as young as 12 years for selectedconditions, precautions do apply. Overdosage: If you think you have taken too much of this medicine contact apoison control center or emergency room at once. NOTE: This medicine is only for you. Do not share this medicine with others. What if I miss a dose? It is important not to miss your dose. Call your doctor or health careprofessional if you are unable to keep an appointment. What may interact with this medication? Interactions have not been studied. This list may not describe all possible interactions. Give your health care provider a list of all  the medicines, herbs, non-prescription drugs, or dietary supplements you use. Also tell them if you smoke, drink alcohol, or use illegaldrugs. Some items may interact with your medicine. What should I watch for while using this medication? This drug may make you feel generally unwell. Continue your course of treatmenteven though you feel ill unless your doctor tells you to stop. You may need blood work done while you are taking this medicine. Do not become pregnant while taking this medicine or for 5 months after stopping it. Women should inform their doctor if they wish to become pregnant or think they might be pregnant. There is a potential for serious side effects to an unborn child. Talk to your health care professional or pharmacist for more information. Do not breast-feed an infant while taking this medicine orfor 5 months after stopping it. What side effects may I notice from receiving this medication? Side effects that you should report to your doctor or health care professionalas soon as possible: allergic reactions like skin rash, itching or hives, swelling of the face, lips, or tongue breathing problems blood in the urine bloody or watery diarrhea or black, tarry stools changes in emotions or moods changes in vision chest pain cough dizziness feeling faint or lightheaded, falls fever, chills headache with fever, neck stiffness, confusion, loss of memory, sensitivity to light, hallucination, loss of contact with reality, or seizures joint pain mouth sores redness, blistering, peeling or loosening of the skin, including inside the mouth severe muscle pain or weakness signs and symptoms of high blood sugar such as dizziness; dry mouth; dry skin; fruity breath; nausea; stomach pain; increased hunger or thirst; increased urination signs and symptoms of  kidney injury like trouble passing urine or change in the amount of urine signs and symptoms of liver injury like dark yellow or brown  urine; general ill feeling or flu-like symptoms; light-colored stools; loss of appetite; nausea; right upper belly pain; unusually weak or tired; yellowing of the eyes or skin swelling of the ankles, feet, hands trouble passing urine or change in the amount of urine unusually weak or tired weight gain or loss Side effects that usually do not require medical attention (report to yourdoctor or health care professional if they continue or are bothersome): bone pain constipation decreased appetite diarrhea muscle pain nausea, vomiting tiredness This list may not describe all possible side effects. Call your doctor for medical advice about side effects. You may report side effects to FDA at1-800-FDA-1088. Where should I keep my medication? This drug is given in a hospital or clinic and will not be stored at home. NOTE: This sheet is a summary. It may not cover all possible information. If you have questions about this medicine, talk to your doctor, pharmacist, orhealth care provider.  2022 Elsevier/Gold Standard (2019-07-14 10:08:25)

## 2020-10-15 DIAGNOSIS — R0789 Other chest pain: Secondary | ICD-10-CM | POA: Diagnosis not present

## 2020-10-15 DIAGNOSIS — I249 Acute ischemic heart disease, unspecified: Secondary | ICD-10-CM | POA: Diagnosis not present

## 2020-10-15 DIAGNOSIS — Z743 Need for continuous supervision: Secondary | ICD-10-CM | POA: Diagnosis not present

## 2020-10-15 DIAGNOSIS — R079 Chest pain, unspecified: Secondary | ICD-10-CM | POA: Diagnosis not present

## 2020-10-15 DIAGNOSIS — I10 Essential (primary) hypertension: Secondary | ICD-10-CM | POA: Diagnosis not present

## 2020-10-15 DIAGNOSIS — R0602 Shortness of breath: Secondary | ICD-10-CM | POA: Diagnosis not present

## 2020-10-16 ENCOUNTER — Encounter: Payer: Self-pay | Admitting: Oncology

## 2020-10-16 DIAGNOSIS — G893 Neoplasm related pain (acute) (chronic): Secondary | ICD-10-CM | POA: Diagnosis not present

## 2020-10-16 DIAGNOSIS — C3491 Malignant neoplasm of unspecified part of right bronchus or lung: Secondary | ICD-10-CM | POA: Diagnosis not present

## 2020-10-16 DIAGNOSIS — R079 Chest pain, unspecified: Secondary | ICD-10-CM | POA: Diagnosis not present

## 2020-10-16 DIAGNOSIS — J9621 Acute and chronic respiratory failure with hypoxia: Secondary | ICD-10-CM | POA: Diagnosis not present

## 2020-10-16 DIAGNOSIS — R262 Difficulty in walking, not elsewhere classified: Secondary | ICD-10-CM | POA: Diagnosis not present

## 2020-10-16 DIAGNOSIS — C3490 Malignant neoplasm of unspecified part of unspecified bronchus or lung: Secondary | ICD-10-CM | POA: Diagnosis not present

## 2020-10-16 DIAGNOSIS — J441 Chronic obstructive pulmonary disease with (acute) exacerbation: Secondary | ICD-10-CM | POA: Diagnosis not present

## 2020-10-16 DIAGNOSIS — I340 Nonrheumatic mitral (valve) insufficiency: Secondary | ICD-10-CM

## 2020-10-17 DIAGNOSIS — C3432 Malignant neoplasm of lower lobe, left bronchus or lung: Secondary | ICD-10-CM | POA: Diagnosis not present

## 2020-10-17 DIAGNOSIS — Z86711 Personal history of pulmonary embolism: Secondary | ICD-10-CM | POA: Diagnosis not present

## 2020-10-17 DIAGNOSIS — Z79899 Other long term (current) drug therapy: Secondary | ICD-10-CM | POA: Diagnosis not present

## 2020-10-17 DIAGNOSIS — R262 Difficulty in walking, not elsewhere classified: Secondary | ICD-10-CM | POA: Diagnosis not present

## 2020-10-17 DIAGNOSIS — C781 Secondary malignant neoplasm of mediastinum: Secondary | ICD-10-CM | POA: Diagnosis not present

## 2020-10-17 DIAGNOSIS — K219 Gastro-esophageal reflux disease without esophagitis: Secondary | ICD-10-CM | POA: Diagnosis not present

## 2020-10-17 DIAGNOSIS — R079 Chest pain, unspecified: Secondary | ICD-10-CM

## 2020-10-17 DIAGNOSIS — C3490 Malignant neoplasm of unspecified part of unspecified bronchus or lung: Secondary | ICD-10-CM

## 2020-10-17 DIAGNOSIS — Z85038 Personal history of other malignant neoplasm of large intestine: Secondary | ICD-10-CM | POA: Diagnosis not present

## 2020-10-17 DIAGNOSIS — J449 Chronic obstructive pulmonary disease, unspecified: Secondary | ICD-10-CM | POA: Diagnosis not present

## 2020-10-17 DIAGNOSIS — C3491 Malignant neoplasm of unspecified part of right bronchus or lung: Secondary | ICD-10-CM | POA: Diagnosis not present

## 2020-10-17 DIAGNOSIS — C7951 Secondary malignant neoplasm of bone: Secondary | ICD-10-CM | POA: Diagnosis not present

## 2020-10-17 DIAGNOSIS — Z87891 Personal history of nicotine dependence: Secondary | ICD-10-CM | POA: Diagnosis not present

## 2020-10-17 DIAGNOSIS — J45998 Other asthma: Secondary | ICD-10-CM | POA: Diagnosis not present

## 2020-10-17 DIAGNOSIS — G893 Neoplasm related pain (acute) (chronic): Secondary | ICD-10-CM | POA: Diagnosis not present

## 2020-10-17 DIAGNOSIS — Z9981 Dependence on supplemental oxygen: Secondary | ICD-10-CM | POA: Diagnosis not present

## 2020-10-17 DIAGNOSIS — E222 Syndrome of inappropriate secretion of antidiuretic hormone: Secondary | ICD-10-CM | POA: Diagnosis not present

## 2020-10-17 DIAGNOSIS — C3481 Malignant neoplasm of overlapping sites of right bronchus and lung: Secondary | ICD-10-CM | POA: Diagnosis not present

## 2020-10-17 DIAGNOSIS — J9621 Acute and chronic respiratory failure with hypoxia: Secondary | ICD-10-CM

## 2020-10-17 DIAGNOSIS — M1990 Unspecified osteoarthritis, unspecified site: Secondary | ICD-10-CM | POA: Diagnosis not present

## 2020-10-17 DIAGNOSIS — I340 Nonrheumatic mitral (valve) insufficiency: Secondary | ICD-10-CM | POA: Diagnosis not present

## 2020-10-17 DIAGNOSIS — Z7901 Long term (current) use of anticoagulants: Secondary | ICD-10-CM | POA: Diagnosis not present

## 2020-10-17 DIAGNOSIS — G2581 Restless legs syndrome: Secondary | ICD-10-CM | POA: Diagnosis not present

## 2020-10-17 NOTE — Progress Notes (Signed)
Sent in request for DOS 08/02 and 08/04 to [employer].

## 2020-10-18 DIAGNOSIS — C3432 Malignant neoplasm of lower lobe, left bronchus or lung: Secondary | ICD-10-CM | POA: Diagnosis not present

## 2020-10-19 ENCOUNTER — Telehealth: Payer: Self-pay | Admitting: Oncology

## 2020-10-19 ENCOUNTER — Other Ambulatory Visit: Payer: Self-pay | Admitting: Oncology

## 2020-10-19 DIAGNOSIS — Z85038 Personal history of other malignant neoplasm of large intestine: Secondary | ICD-10-CM | POA: Diagnosis not present

## 2020-10-19 DIAGNOSIS — C3432 Malignant neoplasm of lower lobe, left bronchus or lung: Secondary | ICD-10-CM | POA: Diagnosis not present

## 2020-10-19 DIAGNOSIS — C7951 Secondary malignant neoplasm of bone: Secondary | ICD-10-CM | POA: Diagnosis not present

## 2020-10-19 NOTE — Telephone Encounter (Signed)
10/19/20 Patient admitted to hospital-all appts cancelled.

## 2020-10-19 NOTE — Progress Notes (Signed)
Sent in request for DOS 08/03, 08/04, and 08/05 to [employer].

## 2020-10-24 ENCOUNTER — Inpatient Hospital Stay: Payer: Medicare Other

## 2020-10-24 ENCOUNTER — Ambulatory Visit: Payer: Medicare Other | Admitting: Hematology and Oncology

## 2020-10-24 ENCOUNTER — Other Ambulatory Visit: Payer: Medicare Other

## 2020-10-24 DIAGNOSIS — Z510 Encounter for antineoplastic radiation therapy: Secondary | ICD-10-CM | POA: Diagnosis not present

## 2020-10-24 DIAGNOSIS — Z741 Need for assistance with personal care: Secondary | ICD-10-CM | POA: Diagnosis not present

## 2020-10-24 DIAGNOSIS — R2689 Other abnormalities of gait and mobility: Secondary | ICD-10-CM | POA: Diagnosis not present

## 2020-10-24 DIAGNOSIS — Z993 Dependence on wheelchair: Secondary | ICD-10-CM | POA: Diagnosis not present

## 2020-10-24 DIAGNOSIS — C781 Secondary malignant neoplasm of mediastinum: Secondary | ICD-10-CM | POA: Diagnosis not present

## 2020-10-24 DIAGNOSIS — D509 Iron deficiency anemia, unspecified: Secondary | ICD-10-CM | POA: Diagnosis not present

## 2020-10-24 DIAGNOSIS — E222 Syndrome of inappropriate secretion of antidiuretic hormone: Secondary | ICD-10-CM | POA: Diagnosis not present

## 2020-10-24 DIAGNOSIS — J9611 Chronic respiratory failure with hypoxia: Secondary | ICD-10-CM | POA: Diagnosis not present

## 2020-10-24 DIAGNOSIS — C189 Malignant neoplasm of colon, unspecified: Secondary | ICD-10-CM | POA: Diagnosis not present

## 2020-10-24 DIAGNOSIS — Z9114 Patient's other noncompliance with medication regimen: Secondary | ICD-10-CM | POA: Diagnosis not present

## 2020-10-24 DIAGNOSIS — D630 Anemia in neoplastic disease: Secondary | ICD-10-CM | POA: Diagnosis not present

## 2020-10-24 DIAGNOSIS — Z66 Do not resuscitate: Secondary | ICD-10-CM | POA: Diagnosis not present

## 2020-10-24 DIAGNOSIS — E039 Hypothyroidism, unspecified: Secondary | ICD-10-CM | POA: Diagnosis not present

## 2020-10-24 DIAGNOSIS — M79661 Pain in right lower leg: Secondary | ICD-10-CM | POA: Diagnosis not present

## 2020-10-24 DIAGNOSIS — R252 Cramp and spasm: Secondary | ICD-10-CM | POA: Diagnosis not present

## 2020-10-24 DIAGNOSIS — Z888 Allergy status to other drugs, medicaments and biological substances status: Secondary | ICD-10-CM | POA: Diagnosis not present

## 2020-10-24 DIAGNOSIS — C3432 Malignant neoplasm of lower lobe, left bronchus or lung: Secondary | ICD-10-CM | POA: Diagnosis not present

## 2020-10-24 DIAGNOSIS — J449 Chronic obstructive pulmonary disease, unspecified: Secondary | ICD-10-CM | POA: Diagnosis not present

## 2020-10-24 DIAGNOSIS — J45909 Unspecified asthma, uncomplicated: Secondary | ICD-10-CM | POA: Diagnosis not present

## 2020-10-24 DIAGNOSIS — I509 Heart failure, unspecified: Secondary | ICD-10-CM | POA: Diagnosis not present

## 2020-10-24 DIAGNOSIS — C3481 Malignant neoplasm of overlapping sites of right bronchus and lung: Secondary | ICD-10-CM | POA: Diagnosis not present

## 2020-10-24 DIAGNOSIS — R6889 Other general symptoms and signs: Secondary | ICD-10-CM | POA: Diagnosis not present

## 2020-10-24 DIAGNOSIS — R5381 Other malaise: Secondary | ICD-10-CM | POA: Diagnosis not present

## 2020-10-24 DIAGNOSIS — Z7901 Long term (current) use of anticoagulants: Secondary | ICD-10-CM | POA: Diagnosis not present

## 2020-10-24 DIAGNOSIS — Z85038 Personal history of other malignant neoplasm of large intestine: Secondary | ICD-10-CM | POA: Diagnosis not present

## 2020-10-24 DIAGNOSIS — M79651 Pain in right thigh: Secondary | ICD-10-CM | POA: Diagnosis not present

## 2020-10-24 DIAGNOSIS — R262 Difficulty in walking, not elsewhere classified: Secondary | ICD-10-CM | POA: Diagnosis not present

## 2020-10-24 DIAGNOSIS — Z85118 Personal history of other malignant neoplasm of bronchus and lung: Secondary | ICD-10-CM | POA: Diagnosis not present

## 2020-10-24 DIAGNOSIS — E161 Other hypoglycemia: Secondary | ICD-10-CM | POA: Diagnosis not present

## 2020-10-24 DIAGNOSIS — C7951 Secondary malignant neoplasm of bone: Secondary | ICD-10-CM | POA: Diagnosis not present

## 2020-10-24 DIAGNOSIS — R531 Weakness: Secondary | ICD-10-CM | POA: Diagnosis not present

## 2020-10-24 DIAGNOSIS — J439 Emphysema, unspecified: Secondary | ICD-10-CM | POA: Diagnosis not present

## 2020-10-24 DIAGNOSIS — Z79899 Other long term (current) drug therapy: Secondary | ICD-10-CM | POA: Diagnosis not present

## 2020-10-24 DIAGNOSIS — E871 Hypo-osmolality and hyponatremia: Secondary | ICD-10-CM | POA: Diagnosis not present

## 2020-10-24 DIAGNOSIS — Z743 Need for continuous supervision: Secondary | ICD-10-CM | POA: Diagnosis not present

## 2020-10-24 DIAGNOSIS — M1990 Unspecified osteoarthritis, unspecified site: Secondary | ICD-10-CM | POA: Diagnosis not present

## 2020-10-24 DIAGNOSIS — I110 Hypertensive heart disease with heart failure: Secondary | ICD-10-CM | POA: Diagnosis not present

## 2020-10-24 DIAGNOSIS — Z87891 Personal history of nicotine dependence: Secondary | ICD-10-CM | POA: Diagnosis not present

## 2020-10-24 DIAGNOSIS — I440 Atrioventricular block, first degree: Secondary | ICD-10-CM | POA: Diagnosis not present

## 2020-10-24 DIAGNOSIS — C3491 Malignant neoplasm of unspecified part of right bronchus or lung: Secondary | ICD-10-CM | POA: Diagnosis not present

## 2020-10-24 DIAGNOSIS — C3490 Malignant neoplasm of unspecified part of unspecified bronchus or lung: Secondary | ICD-10-CM | POA: Diagnosis not present

## 2020-10-24 DIAGNOSIS — E162 Hypoglycemia, unspecified: Secondary | ICD-10-CM | POA: Diagnosis not present

## 2020-10-25 DIAGNOSIS — J9611 Chronic respiratory failure with hypoxia: Secondary | ICD-10-CM | POA: Diagnosis not present

## 2020-10-25 DIAGNOSIS — C3432 Malignant neoplasm of lower lobe, left bronchus or lung: Secondary | ICD-10-CM | POA: Diagnosis not present

## 2020-10-25 DIAGNOSIS — E222 Syndrome of inappropriate secretion of antidiuretic hormone: Secondary | ICD-10-CM | POA: Diagnosis not present

## 2020-10-26 ENCOUNTER — Ambulatory Visit: Payer: Medicare Other

## 2020-10-26 DIAGNOSIS — J9611 Chronic respiratory failure with hypoxia: Secondary | ICD-10-CM | POA: Diagnosis not present

## 2020-10-26 DIAGNOSIS — Z510 Encounter for antineoplastic radiation therapy: Secondary | ICD-10-CM | POA: Diagnosis not present

## 2020-10-26 DIAGNOSIS — C7951 Secondary malignant neoplasm of bone: Secondary | ICD-10-CM | POA: Diagnosis not present

## 2020-10-26 DIAGNOSIS — E222 Syndrome of inappropriate secretion of antidiuretic hormone: Secondary | ICD-10-CM | POA: Diagnosis not present

## 2020-10-26 DIAGNOSIS — C3432 Malignant neoplasm of lower lobe, left bronchus or lung: Secondary | ICD-10-CM | POA: Diagnosis not present

## 2020-10-26 NOTE — Progress Notes (Signed)
Sent in request for DOS 08/08 and 08/09 to [employer].

## 2020-10-27 DIAGNOSIS — Z510 Encounter for antineoplastic radiation therapy: Secondary | ICD-10-CM | POA: Diagnosis not present

## 2020-10-27 DIAGNOSIS — J9611 Chronic respiratory failure with hypoxia: Secondary | ICD-10-CM | POA: Diagnosis not present

## 2020-10-27 DIAGNOSIS — C7951 Secondary malignant neoplasm of bone: Secondary | ICD-10-CM | POA: Diagnosis not present

## 2020-10-27 DIAGNOSIS — C3432 Malignant neoplasm of lower lobe, left bronchus or lung: Secondary | ICD-10-CM | POA: Diagnosis not present

## 2020-10-27 DIAGNOSIS — E222 Syndrome of inappropriate secretion of antidiuretic hormone: Secondary | ICD-10-CM | POA: Diagnosis not present

## 2020-10-28 DIAGNOSIS — E222 Syndrome of inappropriate secretion of antidiuretic hormone: Secondary | ICD-10-CM | POA: Diagnosis not present

## 2020-10-28 DIAGNOSIS — C3432 Malignant neoplasm of lower lobe, left bronchus or lung: Secondary | ICD-10-CM | POA: Diagnosis not present

## 2020-10-28 DIAGNOSIS — J9611 Chronic respiratory failure with hypoxia: Secondary | ICD-10-CM | POA: Diagnosis not present

## 2020-10-29 DIAGNOSIS — J449 Chronic obstructive pulmonary disease, unspecified: Secondary | ICD-10-CM | POA: Diagnosis not present

## 2020-10-29 DIAGNOSIS — C3432 Malignant neoplasm of lower lobe, left bronchus or lung: Secondary | ICD-10-CM | POA: Diagnosis not present

## 2020-10-29 DIAGNOSIS — J9611 Chronic respiratory failure with hypoxia: Secondary | ICD-10-CM | POA: Diagnosis not present

## 2020-10-29 DIAGNOSIS — E222 Syndrome of inappropriate secretion of antidiuretic hormone: Secondary | ICD-10-CM | POA: Diagnosis not present

## 2020-10-30 DIAGNOSIS — E222 Syndrome of inappropriate secretion of antidiuretic hormone: Secondary | ICD-10-CM | POA: Diagnosis not present

## 2020-10-30 DIAGNOSIS — Z510 Encounter for antineoplastic radiation therapy: Secondary | ICD-10-CM | POA: Diagnosis not present

## 2020-10-30 DIAGNOSIS — C7951 Secondary malignant neoplasm of bone: Secondary | ICD-10-CM | POA: Diagnosis not present

## 2020-10-30 DIAGNOSIS — C3432 Malignant neoplasm of lower lobe, left bronchus or lung: Secondary | ICD-10-CM | POA: Diagnosis not present

## 2020-10-30 DIAGNOSIS — J9611 Chronic respiratory failure with hypoxia: Secondary | ICD-10-CM | POA: Diagnosis not present

## 2020-10-31 ENCOUNTER — Inpatient Hospital Stay: Payer: Medicare Other | Attending: Hematology and Oncology

## 2020-10-31 DIAGNOSIS — E222 Syndrome of inappropriate secretion of antidiuretic hormone: Secondary | ICD-10-CM | POA: Diagnosis not present

## 2020-10-31 DIAGNOSIS — E039 Hypothyroidism, unspecified: Secondary | ICD-10-CM | POA: Insufficient documentation

## 2020-10-31 DIAGNOSIS — C7951 Secondary malignant neoplasm of bone: Secondary | ICD-10-CM | POA: Insufficient documentation

## 2020-10-31 DIAGNOSIS — Z510 Encounter for antineoplastic radiation therapy: Secondary | ICD-10-CM | POA: Diagnosis not present

## 2020-10-31 DIAGNOSIS — Z79899 Other long term (current) drug therapy: Secondary | ICD-10-CM | POA: Insufficient documentation

## 2020-10-31 DIAGNOSIS — Z7901 Long term (current) use of anticoagulants: Secondary | ICD-10-CM | POA: Insufficient documentation

## 2020-10-31 DIAGNOSIS — D241 Benign neoplasm of right breast: Secondary | ICD-10-CM | POA: Insufficient documentation

## 2020-10-31 DIAGNOSIS — C3411 Malignant neoplasm of upper lobe, right bronchus or lung: Secondary | ICD-10-CM | POA: Insufficient documentation

## 2020-10-31 DIAGNOSIS — C3432 Malignant neoplasm of lower lobe, left bronchus or lung: Secondary | ICD-10-CM | POA: Diagnosis not present

## 2020-10-31 DIAGNOSIS — E871 Hypo-osmolality and hyponatremia: Secondary | ICD-10-CM | POA: Insufficient documentation

## 2020-10-31 DIAGNOSIS — J9611 Chronic respiratory failure with hypoxia: Secondary | ICD-10-CM | POA: Diagnosis not present

## 2020-10-31 DIAGNOSIS — Z86711 Personal history of pulmonary embolism: Secondary | ICD-10-CM | POA: Insufficient documentation

## 2020-10-31 DIAGNOSIS — J449 Chronic obstructive pulmonary disease, unspecified: Secondary | ICD-10-CM | POA: Insufficient documentation

## 2020-10-31 DIAGNOSIS — Z9981 Dependence on supplemental oxygen: Secondary | ICD-10-CM | POA: Insufficient documentation

## 2020-10-31 DIAGNOSIS — Z85038 Personal history of other malignant neoplasm of large intestine: Secondary | ICD-10-CM | POA: Insufficient documentation

## 2020-11-01 DIAGNOSIS — C3432 Malignant neoplasm of lower lobe, left bronchus or lung: Secondary | ICD-10-CM | POA: Diagnosis not present

## 2020-11-01 DIAGNOSIS — E222 Syndrome of inappropriate secretion of antidiuretic hormone: Secondary | ICD-10-CM | POA: Diagnosis not present

## 2020-11-01 DIAGNOSIS — Z510 Encounter for antineoplastic radiation therapy: Secondary | ICD-10-CM | POA: Diagnosis not present

## 2020-11-01 DIAGNOSIS — J9611 Chronic respiratory failure with hypoxia: Secondary | ICD-10-CM | POA: Diagnosis not present

## 2020-11-01 DIAGNOSIS — C7951 Secondary malignant neoplasm of bone: Secondary | ICD-10-CM | POA: Diagnosis not present

## 2020-11-02 DIAGNOSIS — E222 Syndrome of inappropriate secretion of antidiuretic hormone: Secondary | ICD-10-CM | POA: Diagnosis not present

## 2020-11-02 DIAGNOSIS — C7951 Secondary malignant neoplasm of bone: Secondary | ICD-10-CM | POA: Diagnosis not present

## 2020-11-02 DIAGNOSIS — J9611 Chronic respiratory failure with hypoxia: Secondary | ICD-10-CM | POA: Diagnosis not present

## 2020-11-02 DIAGNOSIS — C3432 Malignant neoplasm of lower lobe, left bronchus or lung: Secondary | ICD-10-CM | POA: Diagnosis not present

## 2020-11-02 DIAGNOSIS — Z510 Encounter for antineoplastic radiation therapy: Secondary | ICD-10-CM | POA: Diagnosis not present

## 2020-11-03 DIAGNOSIS — J9611 Chronic respiratory failure with hypoxia: Secondary | ICD-10-CM | POA: Diagnosis not present

## 2020-11-03 DIAGNOSIS — Z510 Encounter for antineoplastic radiation therapy: Secondary | ICD-10-CM | POA: Diagnosis not present

## 2020-11-03 DIAGNOSIS — C7951 Secondary malignant neoplasm of bone: Secondary | ICD-10-CM | POA: Diagnosis not present

## 2020-11-03 DIAGNOSIS — E222 Syndrome of inappropriate secretion of antidiuretic hormone: Secondary | ICD-10-CM | POA: Diagnosis not present

## 2020-11-03 DIAGNOSIS — C3432 Malignant neoplasm of lower lobe, left bronchus or lung: Secondary | ICD-10-CM | POA: Diagnosis not present

## 2020-11-04 DIAGNOSIS — J9611 Chronic respiratory failure with hypoxia: Secondary | ICD-10-CM | POA: Diagnosis not present

## 2020-11-04 DIAGNOSIS — C3432 Malignant neoplasm of lower lobe, left bronchus or lung: Secondary | ICD-10-CM | POA: Diagnosis not present

## 2020-11-04 DIAGNOSIS — E222 Syndrome of inappropriate secretion of antidiuretic hormone: Secondary | ICD-10-CM | POA: Diagnosis not present

## 2020-11-05 DIAGNOSIS — E222 Syndrome of inappropriate secretion of antidiuretic hormone: Secondary | ICD-10-CM | POA: Diagnosis not present

## 2020-11-05 DIAGNOSIS — C3432 Malignant neoplasm of lower lobe, left bronchus or lung: Secondary | ICD-10-CM | POA: Diagnosis not present

## 2020-11-05 DIAGNOSIS — J9611 Chronic respiratory failure with hypoxia: Secondary | ICD-10-CM | POA: Diagnosis not present

## 2020-11-06 ENCOUNTER — Telehealth: Payer: Self-pay | Admitting: Oncology

## 2020-11-06 ENCOUNTER — Inpatient Hospital Stay: Payer: Medicare Other | Admitting: Oncology

## 2020-11-06 DIAGNOSIS — C7951 Secondary malignant neoplasm of bone: Secondary | ICD-10-CM | POA: Diagnosis not present

## 2020-11-06 DIAGNOSIS — C3432 Malignant neoplasm of lower lobe, left bronchus or lung: Secondary | ICD-10-CM | POA: Diagnosis not present

## 2020-11-06 DIAGNOSIS — J9611 Chronic respiratory failure with hypoxia: Secondary | ICD-10-CM | POA: Diagnosis not present

## 2020-11-06 DIAGNOSIS — Z510 Encounter for antineoplastic radiation therapy: Secondary | ICD-10-CM | POA: Diagnosis not present

## 2020-11-06 DIAGNOSIS — E222 Syndrome of inappropriate secretion of antidiuretic hormone: Secondary | ICD-10-CM | POA: Diagnosis not present

## 2020-11-06 NOTE — Telephone Encounter (Signed)
Per Dr Hinton Rao, cancel patient's Appt for today due to being Inpatient

## 2020-11-07 ENCOUNTER — Inpatient Hospital Stay: Payer: Medicare Other

## 2020-11-07 DIAGNOSIS — C7951 Secondary malignant neoplasm of bone: Secondary | ICD-10-CM | POA: Diagnosis not present

## 2020-11-07 DIAGNOSIS — M79651 Pain in right thigh: Secondary | ICD-10-CM | POA: Diagnosis not present

## 2020-11-07 DIAGNOSIS — M79661 Pain in right lower leg: Secondary | ICD-10-CM | POA: Diagnosis not present

## 2020-11-07 DIAGNOSIS — Z510 Encounter for antineoplastic radiation therapy: Secondary | ICD-10-CM | POA: Diagnosis not present

## 2020-11-07 DIAGNOSIS — E222 Syndrome of inappropriate secretion of antidiuretic hormone: Secondary | ICD-10-CM | POA: Diagnosis not present

## 2020-11-07 DIAGNOSIS — C3432 Malignant neoplasm of lower lobe, left bronchus or lung: Secondary | ICD-10-CM | POA: Diagnosis not present

## 2020-11-07 DIAGNOSIS — J9611 Chronic respiratory failure with hypoxia: Secondary | ICD-10-CM | POA: Diagnosis not present

## 2020-11-08 DIAGNOSIS — Z510 Encounter for antineoplastic radiation therapy: Secondary | ICD-10-CM | POA: Diagnosis not present

## 2020-11-08 DIAGNOSIS — E222 Syndrome of inappropriate secretion of antidiuretic hormone: Secondary | ICD-10-CM | POA: Diagnosis not present

## 2020-11-08 DIAGNOSIS — C3432 Malignant neoplasm of lower lobe, left bronchus or lung: Secondary | ICD-10-CM | POA: Diagnosis not present

## 2020-11-08 DIAGNOSIS — C7951 Secondary malignant neoplasm of bone: Secondary | ICD-10-CM | POA: Diagnosis not present

## 2020-11-08 DIAGNOSIS — J9611 Chronic respiratory failure with hypoxia: Secondary | ICD-10-CM | POA: Diagnosis not present

## 2020-11-09 DIAGNOSIS — C781 Secondary malignant neoplasm of mediastinum: Secondary | ICD-10-CM | POA: Diagnosis not present

## 2020-11-09 DIAGNOSIS — J9611 Chronic respiratory failure with hypoxia: Secondary | ICD-10-CM | POA: Diagnosis not present

## 2020-11-09 DIAGNOSIS — C3481 Malignant neoplasm of overlapping sites of right bronchus and lung: Secondary | ICD-10-CM | POA: Diagnosis not present

## 2020-11-09 DIAGNOSIS — C7951 Secondary malignant neoplasm of bone: Secondary | ICD-10-CM | POA: Diagnosis not present

## 2020-11-09 DIAGNOSIS — E222 Syndrome of inappropriate secretion of antidiuretic hormone: Secondary | ICD-10-CM | POA: Diagnosis not present

## 2020-11-09 DIAGNOSIS — Z510 Encounter for antineoplastic radiation therapy: Secondary | ICD-10-CM | POA: Diagnosis not present

## 2020-11-09 DIAGNOSIS — C3432 Malignant neoplasm of lower lobe, left bronchus or lung: Secondary | ICD-10-CM | POA: Diagnosis not present

## 2020-11-09 DIAGNOSIS — Z87891 Personal history of nicotine dependence: Secondary | ICD-10-CM | POA: Diagnosis not present

## 2020-11-10 ENCOUNTER — Other Ambulatory Visit: Payer: Self-pay | Admitting: Hematology and Oncology

## 2020-11-10 DIAGNOSIS — J449 Chronic obstructive pulmonary disease, unspecified: Secondary | ICD-10-CM | POA: Diagnosis not present

## 2020-11-10 DIAGNOSIS — Z741 Need for assistance with personal care: Secondary | ICD-10-CM | POA: Diagnosis not present

## 2020-11-10 DIAGNOSIS — C3490 Malignant neoplasm of unspecified part of unspecified bronchus or lung: Secondary | ICD-10-CM | POA: Diagnosis not present

## 2020-11-10 DIAGNOSIS — Z7901 Long term (current) use of anticoagulants: Secondary | ICD-10-CM | POA: Diagnosis not present

## 2020-11-10 DIAGNOSIS — E222 Syndrome of inappropriate secretion of antidiuretic hormone: Secondary | ICD-10-CM | POA: Diagnosis not present

## 2020-11-10 DIAGNOSIS — R531 Weakness: Secondary | ICD-10-CM | POA: Diagnosis not present

## 2020-11-10 DIAGNOSIS — E870 Hyperosmolality and hypernatremia: Secondary | ICD-10-CM | POA: Diagnosis not present

## 2020-11-10 DIAGNOSIS — Z510 Encounter for antineoplastic radiation therapy: Secondary | ICD-10-CM | POA: Diagnosis not present

## 2020-11-10 DIAGNOSIS — C189 Malignant neoplasm of colon, unspecified: Secondary | ICD-10-CM | POA: Diagnosis not present

## 2020-11-10 DIAGNOSIS — C801 Malignant (primary) neoplasm, unspecified: Secondary | ICD-10-CM | POA: Diagnosis not present

## 2020-11-10 DIAGNOSIS — J9611 Chronic respiratory failure with hypoxia: Secondary | ICD-10-CM | POA: Diagnosis not present

## 2020-11-10 DIAGNOSIS — C3432 Malignant neoplasm of lower lobe, left bronchus or lung: Secondary | ICD-10-CM | POA: Diagnosis not present

## 2020-11-10 DIAGNOSIS — C3491 Malignant neoplasm of unspecified part of right bronchus or lung: Secondary | ICD-10-CM | POA: Diagnosis not present

## 2020-11-10 DIAGNOSIS — E871 Hypo-osmolality and hyponatremia: Secondary | ICD-10-CM | POA: Diagnosis not present

## 2020-11-10 DIAGNOSIS — E559 Vitamin D deficiency, unspecified: Secondary | ICD-10-CM | POA: Diagnosis not present

## 2020-11-10 DIAGNOSIS — I509 Heart failure, unspecified: Secondary | ICD-10-CM | POA: Diagnosis not present

## 2020-11-10 DIAGNOSIS — Z85038 Personal history of other malignant neoplasm of large intestine: Secondary | ICD-10-CM | POA: Diagnosis not present

## 2020-11-10 DIAGNOSIS — Z87891 Personal history of nicotine dependence: Secondary | ICD-10-CM | POA: Diagnosis not present

## 2020-11-10 DIAGNOSIS — Z86711 Personal history of pulmonary embolism: Secondary | ICD-10-CM | POA: Diagnosis not present

## 2020-11-10 DIAGNOSIS — D509 Iron deficiency anemia, unspecified: Secondary | ICD-10-CM | POA: Diagnosis not present

## 2020-11-10 DIAGNOSIS — Z9981 Dependence on supplemental oxygen: Secondary | ICD-10-CM | POA: Diagnosis not present

## 2020-11-10 DIAGNOSIS — C3481 Malignant neoplasm of overlapping sites of right bronchus and lung: Secondary | ICD-10-CM | POA: Diagnosis not present

## 2020-11-10 DIAGNOSIS — D519 Vitamin B12 deficiency anemia, unspecified: Secondary | ICD-10-CM | POA: Diagnosis not present

## 2020-11-10 DIAGNOSIS — J45909 Unspecified asthma, uncomplicated: Secondary | ICD-10-CM | POA: Diagnosis not present

## 2020-11-10 DIAGNOSIS — R2689 Other abnormalities of gait and mobility: Secondary | ICD-10-CM | POA: Diagnosis not present

## 2020-11-10 DIAGNOSIS — E039 Hypothyroidism, unspecified: Secondary | ICD-10-CM | POA: Diagnosis not present

## 2020-11-10 DIAGNOSIS — M1990 Unspecified osteoarthritis, unspecified site: Secondary | ICD-10-CM | POA: Diagnosis not present

## 2020-11-10 DIAGNOSIS — D241 Benign neoplasm of right breast: Secondary | ICD-10-CM | POA: Diagnosis not present

## 2020-11-10 DIAGNOSIS — R5381 Other malaise: Secondary | ICD-10-CM | POA: Diagnosis not present

## 2020-11-10 DIAGNOSIS — J45998 Other asthma: Secondary | ICD-10-CM | POA: Diagnosis not present

## 2020-11-10 DIAGNOSIS — Z79899 Other long term (current) drug therapy: Secondary | ICD-10-CM | POA: Diagnosis not present

## 2020-11-10 DIAGNOSIS — C7951 Secondary malignant neoplasm of bone: Secondary | ICD-10-CM | POA: Diagnosis not present

## 2020-11-10 DIAGNOSIS — I10 Essential (primary) hypertension: Secondary | ICD-10-CM | POA: Diagnosis not present

## 2020-11-10 DIAGNOSIS — Z743 Need for continuous supervision: Secondary | ICD-10-CM | POA: Diagnosis not present

## 2020-11-10 DIAGNOSIS — C781 Secondary malignant neoplasm of mediastinum: Secondary | ICD-10-CM | POA: Diagnosis not present

## 2020-11-10 DIAGNOSIS — R262 Difficulty in walking, not elsewhere classified: Secondary | ICD-10-CM | POA: Diagnosis not present

## 2020-11-10 DIAGNOSIS — C3411 Malignant neoplasm of upper lobe, right bronchus or lung: Secondary | ICD-10-CM | POA: Diagnosis not present

## 2020-11-13 DIAGNOSIS — C7951 Secondary malignant neoplasm of bone: Secondary | ICD-10-CM | POA: Diagnosis not present

## 2020-11-13 DIAGNOSIS — Z510 Encounter for antineoplastic radiation therapy: Secondary | ICD-10-CM | POA: Diagnosis not present

## 2020-11-13 DIAGNOSIS — Z87891 Personal history of nicotine dependence: Secondary | ICD-10-CM | POA: Diagnosis not present

## 2020-11-13 DIAGNOSIS — C3481 Malignant neoplasm of overlapping sites of right bronchus and lung: Secondary | ICD-10-CM | POA: Diagnosis not present

## 2020-11-13 DIAGNOSIS — C781 Secondary malignant neoplasm of mediastinum: Secondary | ICD-10-CM | POA: Diagnosis not present

## 2020-11-13 DIAGNOSIS — C3432 Malignant neoplasm of lower lobe, left bronchus or lung: Secondary | ICD-10-CM | POA: Diagnosis not present

## 2020-11-14 ENCOUNTER — Inpatient Hospital Stay: Payer: Medicare Other

## 2020-11-14 DIAGNOSIS — C3432 Malignant neoplasm of lower lobe, left bronchus or lung: Secondary | ICD-10-CM

## 2020-11-14 DIAGNOSIS — E222 Syndrome of inappropriate secretion of antidiuretic hormone: Secondary | ICD-10-CM

## 2020-11-14 DIAGNOSIS — E871 Hypo-osmolality and hyponatremia: Secondary | ICD-10-CM

## 2020-11-14 DIAGNOSIS — Z9981 Dependence on supplemental oxygen: Secondary | ICD-10-CM | POA: Diagnosis not present

## 2020-11-14 DIAGNOSIS — Z86711 Personal history of pulmonary embolism: Secondary | ICD-10-CM | POA: Diagnosis not present

## 2020-11-14 DIAGNOSIS — E039 Hypothyroidism, unspecified: Secondary | ICD-10-CM | POA: Diagnosis not present

## 2020-11-14 DIAGNOSIS — D241 Benign neoplasm of right breast: Secondary | ICD-10-CM | POA: Diagnosis not present

## 2020-11-14 DIAGNOSIS — C781 Secondary malignant neoplasm of mediastinum: Secondary | ICD-10-CM | POA: Diagnosis not present

## 2020-11-14 DIAGNOSIS — C3411 Malignant neoplasm of upper lobe, right bronchus or lung: Secondary | ICD-10-CM | POA: Diagnosis not present

## 2020-11-14 DIAGNOSIS — C7951 Secondary malignant neoplasm of bone: Secondary | ICD-10-CM | POA: Diagnosis not present

## 2020-11-14 DIAGNOSIS — Z85038 Personal history of other malignant neoplasm of large intestine: Secondary | ICD-10-CM | POA: Diagnosis not present

## 2020-11-14 DIAGNOSIS — Z87891 Personal history of nicotine dependence: Secondary | ICD-10-CM | POA: Diagnosis not present

## 2020-11-14 DIAGNOSIS — Z79899 Other long term (current) drug therapy: Secondary | ICD-10-CM | POA: Diagnosis not present

## 2020-11-14 DIAGNOSIS — Z7901 Long term (current) use of anticoagulants: Secondary | ICD-10-CM | POA: Diagnosis not present

## 2020-11-14 DIAGNOSIS — Z510 Encounter for antineoplastic radiation therapy: Secondary | ICD-10-CM | POA: Diagnosis not present

## 2020-11-14 DIAGNOSIS — C3481 Malignant neoplasm of overlapping sites of right bronchus and lung: Secondary | ICD-10-CM | POA: Diagnosis not present

## 2020-11-14 DIAGNOSIS — J449 Chronic obstructive pulmonary disease, unspecified: Secondary | ICD-10-CM | POA: Diagnosis not present

## 2020-11-14 LAB — BASIC METABOLIC PANEL
BUN: 16 (ref 4–21)
CO2: 31 — AB (ref 13–22)
Chloride: 93 — AB (ref 99–108)
Creatinine: 1.1 (ref 0.5–1.1)
Glucose: 106
Potassium: 3.7 (ref 3.4–5.3)
Sodium: 133 — AB (ref 137–147)

## 2020-11-14 LAB — HEPATIC FUNCTION PANEL
ALT: 13 (ref 7–35)
AST: 23 (ref 13–35)
Alkaline Phosphatase: 82 (ref 25–125)
Bilirubin, Total: 0.4

## 2020-11-14 LAB — COMPREHENSIVE METABOLIC PANEL
Albumin: 4.1 (ref 3.5–5.0)
Calcium: 9.2 (ref 8.7–10.7)

## 2020-11-14 LAB — CBC AND DIFFERENTIAL
HCT: 30 — AB (ref 36–46)
Hemoglobin: 10.4 — AB (ref 12.0–16.0)
Neutrophils Absolute: 3.42
Platelets: 276 (ref 150–399)
WBC: 5.1

## 2020-11-14 LAB — CBC: RBC: 3.39 — AB (ref 3.87–5.11)

## 2020-11-14 LAB — TSH: TSH: 37.528 u[IU]/mL — ABNORMAL HIGH (ref 0.350–4.500)

## 2020-11-15 ENCOUNTER — Encounter: Payer: Self-pay | Admitting: Hematology and Oncology

## 2020-11-15 ENCOUNTER — Encounter: Payer: Self-pay | Admitting: Oncology

## 2020-11-15 DIAGNOSIS — Z510 Encounter for antineoplastic radiation therapy: Secondary | ICD-10-CM | POA: Diagnosis not present

## 2020-11-15 DIAGNOSIS — C7951 Secondary malignant neoplasm of bone: Secondary | ICD-10-CM | POA: Diagnosis not present

## 2020-11-15 DIAGNOSIS — C3432 Malignant neoplasm of lower lobe, left bronchus or lung: Secondary | ICD-10-CM | POA: Diagnosis not present

## 2020-11-15 DIAGNOSIS — C781 Secondary malignant neoplasm of mediastinum: Secondary | ICD-10-CM | POA: Diagnosis not present

## 2020-11-15 DIAGNOSIS — Z87891 Personal history of nicotine dependence: Secondary | ICD-10-CM | POA: Diagnosis not present

## 2020-11-15 DIAGNOSIS — C3481 Malignant neoplasm of overlapping sites of right bronchus and lung: Secondary | ICD-10-CM | POA: Diagnosis not present

## 2020-11-15 LAB — CEA: CEA: 8.8 ng/mL — ABNORMAL HIGH (ref 0.0–4.7)

## 2020-11-15 LAB — T4: T4, Total: 7.3 ug/dL (ref 4.5–12.0)

## 2020-11-15 NOTE — Progress Notes (Signed)
Sent in request for DOS 08/29, 08/30, and 08/31 to [employer].

## 2020-11-16 DIAGNOSIS — C781 Secondary malignant neoplasm of mediastinum: Secondary | ICD-10-CM | POA: Diagnosis not present

## 2020-11-16 DIAGNOSIS — C3432 Malignant neoplasm of lower lobe, left bronchus or lung: Secondary | ICD-10-CM | POA: Diagnosis not present

## 2020-11-16 DIAGNOSIS — I10 Essential (primary) hypertension: Secondary | ICD-10-CM | POA: Diagnosis not present

## 2020-11-16 DIAGNOSIS — J449 Chronic obstructive pulmonary disease, unspecified: Secondary | ICD-10-CM | POA: Diagnosis not present

## 2020-11-16 DIAGNOSIS — Z87891 Personal history of nicotine dependence: Secondary | ICD-10-CM | POA: Diagnosis not present

## 2020-11-16 DIAGNOSIS — Z510 Encounter for antineoplastic radiation therapy: Secondary | ICD-10-CM | POA: Diagnosis not present

## 2020-11-16 DIAGNOSIS — M1990 Unspecified osteoarthritis, unspecified site: Secondary | ICD-10-CM | POA: Diagnosis not present

## 2020-11-16 DIAGNOSIS — C801 Malignant (primary) neoplasm, unspecified: Secondary | ICD-10-CM | POA: Diagnosis not present

## 2020-11-16 DIAGNOSIS — E039 Hypothyroidism, unspecified: Secondary | ICD-10-CM | POA: Diagnosis not present

## 2020-11-16 DIAGNOSIS — I509 Heart failure, unspecified: Secondary | ICD-10-CM | POA: Diagnosis not present

## 2020-11-16 DIAGNOSIS — C3481 Malignant neoplasm of overlapping sites of right bronchus and lung: Secondary | ICD-10-CM | POA: Diagnosis not present

## 2020-11-16 DIAGNOSIS — C7951 Secondary malignant neoplasm of bone: Secondary | ICD-10-CM | POA: Diagnosis not present

## 2020-11-17 ENCOUNTER — Other Ambulatory Visit: Payer: Self-pay | Admitting: Oncology

## 2020-11-17 DIAGNOSIS — E870 Hyperosmolality and hypernatremia: Secondary | ICD-10-CM | POA: Diagnosis not present

## 2020-11-17 DIAGNOSIS — C3432 Malignant neoplasm of lower lobe, left bronchus or lung: Secondary | ICD-10-CM | POA: Diagnosis not present

## 2020-11-17 DIAGNOSIS — M1990 Unspecified osteoarthritis, unspecified site: Secondary | ICD-10-CM | POA: Diagnosis not present

## 2020-11-17 DIAGNOSIS — C7951 Secondary malignant neoplasm of bone: Secondary | ICD-10-CM

## 2020-11-17 DIAGNOSIS — J45998 Other asthma: Secondary | ICD-10-CM | POA: Diagnosis not present

## 2020-11-17 DIAGNOSIS — I509 Heart failure, unspecified: Secondary | ICD-10-CM | POA: Diagnosis not present

## 2020-11-17 DIAGNOSIS — C801 Malignant (primary) neoplasm, unspecified: Secondary | ICD-10-CM | POA: Diagnosis not present

## 2020-11-17 DIAGNOSIS — C3481 Malignant neoplasm of overlapping sites of right bronchus and lung: Secondary | ICD-10-CM | POA: Diagnosis not present

## 2020-11-17 DIAGNOSIS — Z87891 Personal history of nicotine dependence: Secondary | ICD-10-CM | POA: Diagnosis not present

## 2020-11-17 DIAGNOSIS — C781 Secondary malignant neoplasm of mediastinum: Secondary | ICD-10-CM | POA: Diagnosis not present

## 2020-11-17 DIAGNOSIS — I10 Essential (primary) hypertension: Secondary | ICD-10-CM | POA: Diagnosis not present

## 2020-11-17 DIAGNOSIS — Z510 Encounter for antineoplastic radiation therapy: Secondary | ICD-10-CM | POA: Diagnosis not present

## 2020-11-17 DIAGNOSIS — D509 Iron deficiency anemia, unspecified: Secondary | ICD-10-CM | POA: Diagnosis not present

## 2020-11-17 DIAGNOSIS — E559 Vitamin D deficiency, unspecified: Secondary | ICD-10-CM | POA: Diagnosis not present

## 2020-11-17 DIAGNOSIS — E039 Hypothyroidism, unspecified: Secondary | ICD-10-CM | POA: Diagnosis not present

## 2020-11-17 DIAGNOSIS — C3490 Malignant neoplasm of unspecified part of unspecified bronchus or lung: Secondary | ICD-10-CM | POA: Diagnosis not present

## 2020-11-17 DIAGNOSIS — D519 Vitamin B12 deficiency anemia, unspecified: Secondary | ICD-10-CM | POA: Diagnosis not present

## 2020-11-17 MED ORDER — CYCLOBENZAPRINE HCL 10 MG PO TABS
10.0000 mg | ORAL_TABLET | Freq: Three times a day (TID) | ORAL | 3 refills | Status: DC | PRN
Start: 2020-11-17 — End: 2021-07-20

## 2020-11-20 DIAGNOSIS — C781 Secondary malignant neoplasm of mediastinum: Secondary | ICD-10-CM | POA: Diagnosis not present

## 2020-11-20 DIAGNOSIS — C7951 Secondary malignant neoplasm of bone: Secondary | ICD-10-CM | POA: Diagnosis not present

## 2020-11-20 DIAGNOSIS — Z510 Encounter for antineoplastic radiation therapy: Secondary | ICD-10-CM | POA: Diagnosis not present

## 2020-11-20 DIAGNOSIS — C3481 Malignant neoplasm of overlapping sites of right bronchus and lung: Secondary | ICD-10-CM | POA: Diagnosis not present

## 2020-11-20 DIAGNOSIS — C3432 Malignant neoplasm of lower lobe, left bronchus or lung: Secondary | ICD-10-CM | POA: Diagnosis not present

## 2020-11-20 DIAGNOSIS — Z87891 Personal history of nicotine dependence: Secondary | ICD-10-CM | POA: Diagnosis not present

## 2020-11-21 ENCOUNTER — Other Ambulatory Visit: Payer: Self-pay | Admitting: Hematology and Oncology

## 2020-11-21 ENCOUNTER — Inpatient Hospital Stay: Payer: Medicare Other

## 2020-11-21 DIAGNOSIS — C7951 Secondary malignant neoplasm of bone: Secondary | ICD-10-CM | POA: Diagnosis not present

## 2020-11-21 DIAGNOSIS — C3432 Malignant neoplasm of lower lobe, left bronchus or lung: Secondary | ICD-10-CM

## 2020-11-21 DIAGNOSIS — C781 Secondary malignant neoplasm of mediastinum: Secondary | ICD-10-CM | POA: Diagnosis not present

## 2020-11-21 DIAGNOSIS — C3481 Malignant neoplasm of overlapping sites of right bronchus and lung: Secondary | ICD-10-CM | POA: Diagnosis not present

## 2020-11-21 DIAGNOSIS — Z87891 Personal history of nicotine dependence: Secondary | ICD-10-CM | POA: Diagnosis not present

## 2020-11-21 DIAGNOSIS — E222 Syndrome of inappropriate secretion of antidiuretic hormone: Secondary | ICD-10-CM

## 2020-11-21 DIAGNOSIS — E871 Hypo-osmolality and hyponatremia: Secondary | ICD-10-CM

## 2020-11-21 DIAGNOSIS — Z510 Encounter for antineoplastic radiation therapy: Secondary | ICD-10-CM | POA: Diagnosis not present

## 2020-11-21 LAB — BASIC METABOLIC PANEL
BUN: 9 (ref 4–21)
CO2: 28 — AB (ref 13–22)
Chloride: 99 (ref 99–108)
Creatinine: 1 (ref 0.5–1.1)
Glucose: 103
Potassium: 3.8 (ref 3.4–5.3)
Sodium: 136 — AB (ref 137–147)

## 2020-11-21 LAB — HEPATIC FUNCTION PANEL
ALT: 14 (ref 7–35)
AST: 22 (ref 13–35)
Alkaline Phosphatase: 81 (ref 25–125)
Bilirubin, Total: 0.4

## 2020-11-21 LAB — CBC AND DIFFERENTIAL
HCT: 34 — AB (ref 36–46)
Hemoglobin: 10.9 — AB (ref 12.0–16.0)
Neutrophils Absolute: 3.76
Platelets: 261 (ref 150–399)
WBC: 5.3

## 2020-11-21 LAB — CBC: RBC: 3.77 — AB (ref 3.87–5.11)

## 2020-11-21 LAB — COMPREHENSIVE METABOLIC PANEL
Albumin: 4.2 (ref 3.5–5.0)
Calcium: 9.1 (ref 8.7–10.7)

## 2020-11-22 DIAGNOSIS — C7951 Secondary malignant neoplasm of bone: Secondary | ICD-10-CM | POA: Diagnosis not present

## 2020-11-22 DIAGNOSIS — C3432 Malignant neoplasm of lower lobe, left bronchus or lung: Secondary | ICD-10-CM | POA: Diagnosis not present

## 2020-11-22 DIAGNOSIS — Z87891 Personal history of nicotine dependence: Secondary | ICD-10-CM | POA: Diagnosis not present

## 2020-11-22 DIAGNOSIS — Z510 Encounter for antineoplastic radiation therapy: Secondary | ICD-10-CM | POA: Diagnosis not present

## 2020-11-22 DIAGNOSIS — C3481 Malignant neoplasm of overlapping sites of right bronchus and lung: Secondary | ICD-10-CM | POA: Diagnosis not present

## 2020-11-22 DIAGNOSIS — C781 Secondary malignant neoplasm of mediastinum: Secondary | ICD-10-CM | POA: Diagnosis not present

## 2020-11-22 NOTE — Progress Notes (Signed)
Sent in request for DOS 09/06 and 09/13 to [employer].

## 2020-11-28 ENCOUNTER — Inpatient Hospital Stay: Payer: Medicare Other | Attending: Hematology and Oncology

## 2020-11-28 ENCOUNTER — Other Ambulatory Visit: Payer: Self-pay | Admitting: Hematology and Oncology

## 2020-11-28 DIAGNOSIS — E871 Hypo-osmolality and hyponatremia: Secondary | ICD-10-CM | POA: Insufficient documentation

## 2020-11-28 DIAGNOSIS — Z79899 Other long term (current) drug therapy: Secondary | ICD-10-CM | POA: Insufficient documentation

## 2020-11-28 DIAGNOSIS — E039 Hypothyroidism, unspecified: Secondary | ICD-10-CM | POA: Insufficient documentation

## 2020-11-28 DIAGNOSIS — Z9981 Dependence on supplemental oxygen: Secondary | ICD-10-CM | POA: Insufficient documentation

## 2020-11-28 DIAGNOSIS — C7951 Secondary malignant neoplasm of bone: Secondary | ICD-10-CM | POA: Insufficient documentation

## 2020-11-28 DIAGNOSIS — D649 Anemia, unspecified: Secondary | ICD-10-CM | POA: Diagnosis not present

## 2020-11-28 DIAGNOSIS — D241 Benign neoplasm of right breast: Secondary | ICD-10-CM | POA: Insufficient documentation

## 2020-11-28 DIAGNOSIS — Z923 Personal history of irradiation: Secondary | ICD-10-CM | POA: Insufficient documentation

## 2020-11-28 DIAGNOSIS — C3411 Malignant neoplasm of upper lobe, right bronchus or lung: Secondary | ICD-10-CM | POA: Insufficient documentation

## 2020-11-28 DIAGNOSIS — Z85038 Personal history of other malignant neoplasm of large intestine: Secondary | ICD-10-CM | POA: Insufficient documentation

## 2020-11-28 DIAGNOSIS — Z7901 Long term (current) use of anticoagulants: Secondary | ICD-10-CM | POA: Insufficient documentation

## 2020-11-28 DIAGNOSIS — Z86711 Personal history of pulmonary embolism: Secondary | ICD-10-CM | POA: Insufficient documentation

## 2020-11-28 DIAGNOSIS — C3432 Malignant neoplasm of lower lobe, left bronchus or lung: Secondary | ICD-10-CM | POA: Diagnosis not present

## 2020-11-28 DIAGNOSIS — J449 Chronic obstructive pulmonary disease, unspecified: Secondary | ICD-10-CM | POA: Insufficient documentation

## 2020-11-28 LAB — HEPATIC FUNCTION PANEL
ALT: 13 (ref 7–35)
AST: 21 (ref 13–35)
Alkaline Phosphatase: 91 (ref 25–125)
Bilirubin, Total: 0.6

## 2020-11-28 LAB — CBC AND DIFFERENTIAL
HCT: 35 — AB (ref 36–46)
Hemoglobin: 11.2 — AB (ref 12.0–16.0)
Neutrophils Absolute: 5.32
Platelets: 225 (ref 150–399)
WBC: 7

## 2020-11-28 LAB — BASIC METABOLIC PANEL
BUN: 16 (ref 4–21)
CO2: 28 — AB (ref 13–22)
Chloride: 98 — AB (ref 99–108)
Creatinine: 1.1 (ref 0.5–1.1)
Glucose: 121
Potassium: 3.7 (ref 3.4–5.3)
Sodium: 137 (ref 137–147)

## 2020-11-28 LAB — CBC
MCV: 89 (ref 81–99)
RBC: 3.9 (ref 3.87–5.11)

## 2020-11-28 LAB — COMPREHENSIVE METABOLIC PANEL
Albumin: 4.3 (ref 3.5–5.0)
Calcium: 8.6 — AB (ref 8.7–10.7)

## 2020-11-29 DIAGNOSIS — J449 Chronic obstructive pulmonary disease, unspecified: Secondary | ICD-10-CM | POA: Diagnosis not present

## 2020-12-04 DIAGNOSIS — I10 Essential (primary) hypertension: Secondary | ICD-10-CM | POA: Diagnosis not present

## 2020-12-04 DIAGNOSIS — Z09 Encounter for follow-up examination after completed treatment for conditions other than malignant neoplasm: Secondary | ICD-10-CM | POA: Diagnosis not present

## 2020-12-04 DIAGNOSIS — E039 Hypothyroidism, unspecified: Secondary | ICD-10-CM | POA: Diagnosis not present

## 2020-12-04 DIAGNOSIS — E871 Hypo-osmolality and hyponatremia: Secondary | ICD-10-CM | POA: Diagnosis not present

## 2020-12-04 DIAGNOSIS — K219 Gastro-esophageal reflux disease without esophagitis: Secondary | ICD-10-CM | POA: Diagnosis not present

## 2020-12-04 DIAGNOSIS — C3492 Malignant neoplasm of unspecified part of left bronchus or lung: Secondary | ICD-10-CM | POA: Diagnosis not present

## 2020-12-04 DIAGNOSIS — I2699 Other pulmonary embolism without acute cor pulmonale: Secondary | ICD-10-CM | POA: Diagnosis not present

## 2020-12-04 DIAGNOSIS — J449 Chronic obstructive pulmonary disease, unspecified: Secondary | ICD-10-CM | POA: Diagnosis not present

## 2020-12-05 ENCOUNTER — Other Ambulatory Visit: Payer: Self-pay | Admitting: Hematology and Oncology

## 2020-12-05 ENCOUNTER — Inpatient Hospital Stay: Payer: Medicare Other

## 2020-12-05 DIAGNOSIS — D649 Anemia, unspecified: Secondary | ICD-10-CM | POA: Diagnosis not present

## 2020-12-05 DIAGNOSIS — C3432 Malignant neoplasm of lower lobe, left bronchus or lung: Secondary | ICD-10-CM | POA: Diagnosis not present

## 2020-12-05 LAB — BASIC METABOLIC PANEL
BUN: 14 (ref 4–21)
CO2: 25 — AB (ref 13–22)
Chloride: 102 (ref 99–108)
Creatinine: 1.3 — AB (ref 0.5–1.1)
Glucose: 146
Potassium: 3.9 (ref 3.4–5.3)
Sodium: 139 (ref 137–147)

## 2020-12-05 LAB — COMPREHENSIVE METABOLIC PANEL
Albumin: 4.3 (ref 3.5–5.0)
Calcium: 8.6 — AB (ref 8.7–10.7)

## 2020-12-05 LAB — CBC AND DIFFERENTIAL
HCT: 34 — AB (ref 36–46)
Hemoglobin: 11.4 — AB (ref 12.0–16.0)
Neutrophils Absolute: 3.46
Platelets: 232 (ref 150–399)
WBC: 4.8

## 2020-12-05 LAB — HEPATIC FUNCTION PANEL
ALT: 16 (ref 7–35)
AST: 25 (ref 13–35)
Alkaline Phosphatase: 92 (ref 25–125)
Bilirubin, Total: 0.4

## 2020-12-05 LAB — CBC
MCV: 89 (ref 81–99)
RBC: 3.79 — AB (ref 3.87–5.11)

## 2020-12-05 NOTE — Progress Notes (Signed)
Sent in request for DOS 09/22 to [employer].

## 2020-12-07 NOTE — Progress Notes (Signed)
[place]  [address] [place],  [place]  [address] [phone]  Clinic Day:  12/14/2020  Referring physician: Jeanie Sewer, NP  This document serves as a record of services personally performed by Hosie Poisson, MD. It was created on their behalf by Curry,Lauren E, a trained medical scribe. The creation of this record is based on the scribe's personal observations and the provider's statements to them.  CHIEF COMPLAINT:  CC:  Metastatic small cell lung cancer to bone  Current Treatment:    Palliative nivolumab every 2 weeks   HISTORY OF PRESENT ILLNESS:  Gabrielle Hudson is a 80 y.o. female with a history of stage IIB non-small-cell lung cancer of the right upper lobe diagnosed in November 2010.  She had a large endobronchial component causing obstruction, so underwent laser therapy at [facility] with a good response.  Due to severe emphysema and the location of the tumor being within 2 cm of the carina, she was not a surgical candidate.  She was treated with concurrent radiation and chemotherapy with carboplatin and paclitaxel, followed by an additional 2 months of carboplatin and paclitaxel with a good response.  She had bilateral pneumonia in December 2012.  CT scan at that time revealed some abnormality concerning for recurrence, so a PET scan was obtained in January 2013.  There was mild hypermetabolic activity in the right hilum, but the patient opted for observation.  Repeat CT scans had been stable, with scarring of the posterior right upper lobe.  She also has a history of a stage I adenocarcinoma of the colon, arising from a tubulovillous adenoma.  Her colonoscopy in August 2012 had removal of multiple benign polyps.  Her last mammogram was in December 2017.  She had her Port-A-Cath removed in May 2018. She quit smoking about 11 years ago.  CT chest in December 2018 revealed progression of amorphous soft tissue  attenuation of the right hilum.  There was no definite lymphadenopathy.  There were stable scattered tiny pulmonary nodules all less than 5 mm.  There is evidence of emphysema.  There is a stable right liver lesion measuring 2.5 cm which was unchanged for over 5 years.  CT chest in March of 2019 and September of 2019 were stable, with soft tissue attenuation is in the area of previous radiation.  CT chest in October of 2020 revealed all previously noted pulmonary nodules measuring  4 mm or less in size are stable compared to the prior examination, and are considered definitively benign. She also underwent bilateral diagnostic mammogram and right breast ultrasound which revealed the likely benign mass in the right breast at 2:30 is stable. CEA had decreased from 4.6 to 4.4.     She was referred back from [facility] in October 2021 with a diagnosis of a new left small cell lung cancer.  She presented with hemoptysis and pain of the left lower thorax.  CT revealed tumor encasing and narrowing the left lower lobe pulmonary artery and the left lower lobe bronchus.  There is a mass of the left hilum measuring 5.2 cm with left lower lobe atelectasis.  She also has moderate emphysema and mild cardiomegaly, with evidence of coronary artery disease.  Biopsy revealed small cell carcinoma with crush artifact.  She had associated hyponatremia as low as 125, and required demeclocycline.  She was also placed on prednisone, 40 mg daily for 5 days, then 20 mg daily.  She complains of  occasional cough productive of creamy colored sputum and occasional wheezing but no further hemoptysis.  She is using a lidoderm patch for her left sided chest pain with partial relief.  She had been hospitalized several times in August of this year for hyponatremia before her new lung cancer was diagnosed.  In retrospect, the left hilar cancer was not obvious on chest x-ray.  She was found to be iron deficient and her potassium was  down to 3.0.  Baseline CEA was 14.1.  She received 3 cycles of carboplatin/etoposide and has problems with hypomagnesemia  Her CEA had increased in November to 48.3 from 14.1 in October, so we were concerned she may not be responding.  After 3 cycles of carboplatin/etoposide, she was hospitalized with severe weakness and diarrhea.  She was found to have severe neutropenia, which worsened during hospitalization to as low as 0.5 with an ANC of 80, despite Neulasta.  Her white blood count finally improved to 1.5 with an En[place] of 650.  Her hemoglobin was down to 7 and her platelets steadily decreased from 140,000 at admission down to 17,000.  She denied bleeding, but did have severe bruising.  She received platelets and PRBC's during hospitalization.  Her platelets were 50,000 on discharge.  She also had a UTI with Klebsiella pneumoniae, which was treated with IV ceftrixone.  CT  chest in the emergency department revealed extensive bilateral lower lobe segmental pulmonary emboli almost occlusive in the left lower lobe, so she was placed on heparin.  She was discharged In January on apixaban 5 mg twice a day.  We reviewed the previous imaging reports and there was good shrinkage of the tumor.   Her blood counts recovered, so we wanted to give a 4th cycle of chemotherapy with reduction in doses prior to placing her on observation.  We were concerned that she may not be able to tolerate further chemotherapy. She received a 4th cycle of carboplatin/etoposide beginning January 31st with a 25% dose reduction and tolerated it well.   Her CEA was fairly stable at 42.5 in January, and was down to 22.5 in early February.   Repeat imaging in March 2022, unfortunately, revealed new osseous metastasis.  There were radiation changes in the right lung, but no obvious progressive disease in the lung. Due to the bone metastasis, she was placed on palliative immunotherapy with nivolumab every 2 weeks, as well as zoledronic acid every 12  weeks.  She received her 1st cycle of nivolumab along with zoledronic acid on March 17th.  With her 2nd dose of zoledronic acid on June 24th she developed severe hypocalcemia requiring IV and resuming oral supplementation.  She has continued nivolumab every 2 weeks without difficulty. The CEA increased up to 30 in May, down to 28.7 in June.  She underwent CT chest, abdomen and pelvis on June 6th which revealed progressive soft tissue thickening between the aorta and left mainstem bronchus suspicious for recurrent tumor.  There was no  other areas of concern.  There were multiple other stable findings.  We recommended a PET scan for further evaluation, but the patient did not go to that appointment due to the long period of lying on her back.    She was relatively stable but presented to the emergency room in August 2022 with left substernal chest pain with associated dyspnea. EKG and cardiac enzymes were unremarkable. Echocardiogram revealed mild left ventricular hypertrophy with an ejection fraction of 55-60%. CT chest did not reveal any evidence of pulmonary  emboli, but there was progression of the left hilar mass now surrounding the distal left mainstem bronchus measuring 2.8 cm.  There was evidence of radiation scarring of the right hilum.  Her pain is felt to be related to her recurrent small cell carcinoma of the lung. She had hyponatremia with a sodium of 125 during that admission, for which she was taking salt tablets.  She ended up admitted to the ICU with severe hyponatremia with sodium of 114. She was seen by Dr. Orlene Erm and completed radiation for the partial occlusion of the left distal mainstem bronchus and relief of her pain.     INTERVAL HISTORY:  Gabrielle Hudson is here for follow up and states that she is doing better. Her shortness of breath is stable and she remains on supplemental oxygen. She denies further chest pain. She continues to use her motorized wheelchair to get around. She continues oral  calcium at least daily. Blood counts and chemistries are unremarkable except for a hemoglobin of 11.1, down from 11.4. Sodium is 135 today. Her  appetite is good, and she has lost 4 pounds since her last visit.  She denies fever, chills or other signs of infection.  She denies nausea, vomiting, bowel issues, or abdominal pain.  She denies sore throat, cough or chest pain.  REVIEW OF SYSTEMS:  Review of Systems  Constitutional:  Positive for fatigue. Negative for appetite change, chills, fever and unexpected weight change.  HENT:  Negative.    Eyes: Negative.   Respiratory:  Positive for shortness of breath (chronic, but stable). Negative for chest tightness, cough, hemoptysis and wheezing.   Cardiovascular: Negative.  Negative for chest pain, leg swelling and palpitations.  Gastrointestinal: Negative.  Negative for abdominal distention, abdominal pain, blood in stool, constipation, diarrhea, nausea and vomiting.  Endocrine: Negative.   Genitourinary: Negative.  Negative for difficulty urinating, dysuria, frequency and hematuria.   Musculoskeletal:  Positive for arthralgias and gait problem (in a motorized wheelchair). Negative for back pain, flank pain and myalgias.  Skin: Negative.   Neurological:  Positive for gait problem (in a motorized wheelchair). Negative for dizziness, extremity weakness, headaches, light-headedness, numbness, seizures and speech difficulty.  Hematological: Negative.   Psychiatric/Behavioral: Negative.  Negative for depression and sleep disturbance. The patient is not nervous/anxious.    VITALS:  Blood pressure (!) 168/74, pulse 93, temperature 98.5 F (36.9 C), temperature source Oral, resp. rate 20, height 5\' 3"  (1.6 m), weight 225 lb (102.1 kg), SpO2 99 %.  Wt Readings from Last 3 Encounters:  12/14/20 225 lb (102.1 kg)  10/10/20 229 lb (103.9 kg)  09/29/20 232 lb 4 oz (105.3 kg)    Body mass index is 39.86 kg/m.  Performance status (ECOG): 2 - Symptomatic,  <50% confined to bed  PHYSICAL EXAM:  Physical Exam Constitutional:      General: She is not in acute distress.    Appearance: Normal appearance. She is normal weight.  HENT:     Head: Normocephalic and atraumatic.  Eyes:     General: No scleral icterus.    Extraocular Movements: Extraocular movements intact.     Conjunctiva/sclera: Conjunctivae normal.     Pupils: Pupils are equal, round, and reactive to light.  Cardiovascular:     Rate and Rhythm: Normal rate and regular rhythm.     Pulses: Normal pulses.     Heart sounds: Normal heart sounds. No murmur heard.   No friction rub. No gallop.  Pulmonary:     Effort: Pulmonary effort  is normal. No respiratory distress.     Breath sounds: Decreased breath sounds (left base) present.  Abdominal:     General: Bowel sounds are normal. There is no distension.     Palpations: Abdomen is soft. There is no hepatomegaly, splenomegaly or mass.     Tenderness: There is no abdominal tenderness.  Musculoskeletal:        General: Normal range of motion.     Cervical back: Normal range of motion and neck supple.     Right lower leg: Edema present.     Left lower leg: Edema present.  Lymphadenopathy:     Cervical: No cervical adenopathy.  Skin:    General: Skin is warm and dry.  Neurological:     General: No focal deficit present.     Mental Status: She is alert and oriented to person, place, and time. Mental status is at baseline.  Psychiatric:        Mood and Affect: Mood normal.        Behavior: Behavior normal.        Thought Content: Thought content normal.        Judgment: Judgment normal.   LABS:   CBC Latest Ref Rng & Units 12/14/2020 12/05/2020 11/28/2020  WBC - 6.3 4.8 7.0  Hemoglobin 12.0 - 16.0 11.1(A) 11.4(A) 11.2(A)  Hematocrit 36 - 46 34(A) 34(A) 35(A)  Platelets 150 - 399 208 232 225   CMP Latest Ref Rng & Units 12/14/2020 12/05/2020 11/28/2020  Glucose 70 - 99 mg/dL - - -  BUN 4 - 21 16 14 16   Creatinine 0.5 - 1.1 1.0  1.3(A) 1.1  Sodium 137 - 147 135(A) 139 137  Potassium 3.4 - 5.3 4.3 3.9 3.7  Chloride 99 - 108 102 102 98(A)  CO2 13 - 22 24(A) 25(A) 28(A)  Calcium 8.7 - 10.7 8.6(A) 8.6(A) 8.6(A)  Total Protein 6.5 - 8.1 g/dL - - -  Total Bilirubin 0.3 - 1.2 mg/dL - - -  Alkaline Phos 25 - 125 108 92 91  AST 13 - 35 34 25 21  ALT 7 - 35 25 16 13     Lab Results  Component Value Date   CEA1 8.8 (H) 11/14/2020   /  CEA  Date Value Ref Range Status  11/14/2020 8.8 (H) 0.0 - 4.7 ng/mL Final    Comment:    (NOTE)                             Nonsmokers          <3.9                             Smokers             <5.6 [employer] Electrochemiluminescence Immunoassay (ECLIA) Values obtained with different assay methods or kits cannot be used interchangeably.  Results cannot be interpreted as absolute evidence of the presence or absence of malignant disease. Performed At: [facility] [place], [place] [id number] Rush Farmer MD WE:[id number]     Lab Results  Component Value Date   TOTALPROTELP 4.7 (L) 03/26/2011   ALBUMINELP 52.5 (L) 03/26/2011   A1GS 8.7 (H) 03/26/2011   A2GS 18.8 (H) 03/26/2011   BETS 4.5 (L) 03/26/2011   BETA2SER 4.7 03/26/2011   GAMS 10.8 (L) 03/26/2011   MSPIKE NOT DETECTED 03/26/2011   SPEI (NOTE)  03/26/2011   Lab Results  Component Value Date   TIBC 226 04/19/2020   TIBC 219 (L) 03/29/2011   FERRITIN 361 04/19/2020   FERRITIN 356 (H) 03/29/2011   IRONPCTSAT 44.2 04/19/2020   IRONPCTSAT 65 (H) 03/29/2011   Lab Results  Component Value Date   LDH 349 (H) 03/26/2011    STUDIES:  No results found.  HISTORY:   Allergies:  Allergies  Allergen Reactions   Nitroglycerin In D5w Other (See Comments)    "flatlines"   Nitroglycerin Other (See Comments)    Drops her BP 'flatlines'     Current Medications: Current Outpatient Medications  Medication Sig Dispense Refill   albuterol (PROVENTIL) (5 MG/ML) 0.5% nebulizer  solution Take 2.5 mg by nebulization daily.     albuterol (VENTOLIN HFA) 108 (90 Base) MCG/ACT inhaler      budesonide-formoterol (SYMBICORT) 160-4.5 MCG/ACT inhaler Inhale 2 puffs into the lungs 2 (two) times daily.       calcium citrate-vitamin D (CITRACAL+D) 315-200 MG-UNIT tablet Take 1 tablet by mouth 2 (two) times daily.     Cholecalciferol (VITAMIN D3) 1.25 MG (50000 UT) CAPS      cyclobenzaprine (FLEXERIL) 10 MG tablet Take 1 tablet (10 mg total) by mouth 3 (three) times daily as needed for muscle spasms. 30 tablet 3   diltiazem (CARDIZEM CD) 120 MG 24 hr capsule Take 120 mg by mouth daily.     ELIQUIS 5 MG TABS tablet Take 1 tablet by mouth twice daily 60 tablet 2   EUTHYROX 88 MCG tablet Take 88 mcg by mouth daily.     famotidine (PEPCID) 20 MG tablet Take 20 mg by mouth 2 (two) times daily.     fluticasone (FLONASE) 50 MCG/ACT nasal spray Place into both nostrils.     furosemide (LASIX) 20 MG tablet Take 20 mg by mouth as needed. ONLY TAKES IT HAVING SWELLING     gabapentin (NEURONTIN) 300 MG capsule Take 300 mg by mouth as needed.     guaiFENesin (MUCINEX) 600 MG 12 hr tablet Take 1 tablet by mouth every 12 (twelve) hours. (Patient not taking: Reported on 10/10/2020)     meclizine (ANTIVERT) 25 MG tablet Take 0.5-1 tablets (12.5-25 mg total) by mouth 3 (three) times daily as needed for dizziness. 30 tablet 0   ondansetron (ZOFRAN) 4 MG tablet Take 1 tablet (4 mg total) by mouth every 4 (four) hours as needed for nausea. 90 tablet 3   OXYGEN Inhale 3 L into the lungs continuous.     Potassium Chloride ER 20 MEQ TBCR Take 1 tablet by mouth 2 (two) times daily.     prochlorperazine (COMPAZINE) 10 MG tablet Take 1 tablet (10 mg total) by mouth every 6 (six) hours as needed for nausea or vomiting. 90 tablet 3   rOPINIRole (REQUIP) 0.5 MG tablet Take 0.5 mg by mouth at bedtime.     sodium chloride 1 g tablet Take 1 g by mouth 2 (two) times daily.     Vitamin D, Ergocalciferol, (DRISDOL)  1.25 MG (50000 UNIT) CAPS capsule Take 50,000 Units by mouth once a week.     No current facility-administered medications for this visit.   ASSESSMENT & PLAN:   Assessment:  1. History of stage IIB non-small cell lung cancer in November 2010.   2. History of stage I colon cancer.    3. Severe oxygen dependant COPD.   4. Small cell carcinoma of the lung, at least a clinical stage IIIB, diagnosed  in October 2021.   She was initially treated with chemotherapy with carboplatin/etoposide with a good response.  She was then switched to nivolumab. However, she was found to have recent progression on disease in August 2022.  She underwent radiation therapy which she completed on August .  As she has stabilized, we will plan to resume treatment with combination chemotherapy/immunotherapy which will consist of carboplatin/etoposide/atezolizumab.  5.  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 She was placed on zoledronic acid in March 2022.  She developed severe hypocalcemia with her 1st dose and we have not resumed this yet.    6. Hypocalcemia, due to zoledronic acid. She only received 1 dose in March 2022. We will reconsider giving this every 3 months if her calcium improves. I urged her to continue at least 2-3 calcium pills daily.   7. Severe hyponatremia secondary to SIADH due to her small cell lung cancer.  She was treated with demeclocycline, but the sodium improved with the radiation of her tumor.  She continues salt tablets.  Her sodium has improved.  8. Hypothyroidism, with mild elevation of the TSH, which may be secondary to nivolumab.  We will add a level today and continue to monitor this.  Plan:     She completed radiation therapy with Dr. Orlene Erm at the end of August. She is doing better, and is back to her baseline so wishes to resume therapy. We will plan to start her back on  treatment which will consist of carboplatin/etoposide/atezolizumab.  This will tentatively be scheduled for October 3rd.  We will schedule her for CT chest, abdomen and pelvis next week for new baseline. We will plan to see her back 3 weeks later with CBC, CMP, TSH and CEA prior to her next cycle.  At a later date, we will decide whether to resume zoledronic acid. The patient understands the plans discussed today and is in agreement with them.  She knows to contact our office if she develops concerns prior to her next appointment.   I, Rita Ohara, am acting as scribe for Derwood Kaplan, MD  I have reviewed this report as typed by the medical scribe, and it is complete and accurate.

## 2020-12-13 ENCOUNTER — Other Ambulatory Visit: Payer: Self-pay | Admitting: Oncology

## 2020-12-13 DIAGNOSIS — C3432 Malignant neoplasm of lower lobe, left bronchus or lung: Secondary | ICD-10-CM

## 2020-12-14 ENCOUNTER — Encounter: Payer: Self-pay | Admitting: Oncology

## 2020-12-14 ENCOUNTER — Inpatient Hospital Stay: Payer: Medicare Other

## 2020-12-14 ENCOUNTER — Other Ambulatory Visit: Payer: Self-pay | Admitting: Oncology

## 2020-12-14 ENCOUNTER — Inpatient Hospital Stay (INDEPENDENT_AMBULATORY_CARE_PROVIDER_SITE_OTHER): Payer: Medicare Other | Admitting: Oncology

## 2020-12-14 VITALS — BP 168/74 | HR 93 | Temp 98.5°F | Resp 20 | Ht 63.0 in | Wt 225.0 lb

## 2020-12-14 DIAGNOSIS — C7952 Secondary malignant neoplasm of bone marrow: Secondary | ICD-10-CM | POA: Diagnosis not present

## 2020-12-14 DIAGNOSIS — C7951 Secondary malignant neoplasm of bone: Secondary | ICD-10-CM

## 2020-12-14 DIAGNOSIS — D241 Benign neoplasm of right breast: Secondary | ICD-10-CM | POA: Diagnosis not present

## 2020-12-14 DIAGNOSIS — J449 Chronic obstructive pulmonary disease, unspecified: Secondary | ICD-10-CM | POA: Diagnosis not present

## 2020-12-14 DIAGNOSIS — E039 Hypothyroidism, unspecified: Secondary | ICD-10-CM | POA: Diagnosis not present

## 2020-12-14 DIAGNOSIS — D649 Anemia, unspecified: Secondary | ICD-10-CM | POA: Diagnosis not present

## 2020-12-14 DIAGNOSIS — Z85038 Personal history of other malignant neoplasm of large intestine: Secondary | ICD-10-CM | POA: Diagnosis not present

## 2020-12-14 DIAGNOSIS — E032 Hypothyroidism due to medicaments and other exogenous substances: Secondary | ICD-10-CM

## 2020-12-14 DIAGNOSIS — Z86711 Personal history of pulmonary embolism: Secondary | ICD-10-CM | POA: Diagnosis not present

## 2020-12-14 DIAGNOSIS — Z923 Personal history of irradiation: Secondary | ICD-10-CM | POA: Diagnosis not present

## 2020-12-14 DIAGNOSIS — C3432 Malignant neoplasm of lower lobe, left bronchus or lung: Secondary | ICD-10-CM

## 2020-12-14 DIAGNOSIS — Z7901 Long term (current) use of anticoagulants: Secondary | ICD-10-CM | POA: Diagnosis not present

## 2020-12-14 DIAGNOSIS — D61818 Other pancytopenia: Secondary | ICD-10-CM | POA: Diagnosis not present

## 2020-12-14 DIAGNOSIS — C3411 Malignant neoplasm of upper lobe, right bronchus or lung: Secondary | ICD-10-CM | POA: Diagnosis not present

## 2020-12-14 DIAGNOSIS — C3490 Malignant neoplasm of unspecified part of unspecified bronchus or lung: Secondary | ICD-10-CM

## 2020-12-14 DIAGNOSIS — Z9981 Dependence on supplemental oxygen: Secondary | ICD-10-CM | POA: Diagnosis not present

## 2020-12-14 DIAGNOSIS — E871 Hypo-osmolality and hyponatremia: Secondary | ICD-10-CM | POA: Diagnosis not present

## 2020-12-14 DIAGNOSIS — E222 Syndrome of inappropriate secretion of antidiuretic hormone: Secondary | ICD-10-CM

## 2020-12-14 DIAGNOSIS — Z79899 Other long term (current) drug therapy: Secondary | ICD-10-CM | POA: Diagnosis not present

## 2020-12-14 LAB — CBC AND DIFFERENTIAL
HCT: 34 — AB (ref 36–46)
Hemoglobin: 11.1 — AB (ref 12.0–16.0)
Neutrophils Absolute: 4.47
Platelets: 208 (ref 150–399)
WBC: 6.3

## 2020-12-14 LAB — COMPREHENSIVE METABOLIC PANEL
Albumin: 4.2 (ref 3.5–5.0)
Calcium: 8.6 — AB (ref 8.7–10.7)

## 2020-12-14 LAB — HEPATIC FUNCTION PANEL
ALT: 25 (ref 7–35)
AST: 34 (ref 13–35)
Alkaline Phosphatase: 108 (ref 25–125)
Bilirubin, Total: 0.4

## 2020-12-14 LAB — TSH: TSH: 5.748 u[IU]/mL — ABNORMAL HIGH (ref 0.350–4.500)

## 2020-12-14 LAB — CBC: RBC: 3.76 — AB (ref 3.87–5.11)

## 2020-12-14 LAB — BASIC METABOLIC PANEL
BUN: 16 (ref 4–21)
CO2: 24 — AB (ref 13–22)
Chloride: 102 (ref 99–108)
Creatinine: 1 (ref 0.5–1.1)
Glucose: 120
Potassium: 4.3 (ref 3.4–5.3)
Sodium: 135 — AB (ref 137–147)

## 2020-12-15 LAB — CEA: CEA: 8 ng/mL — ABNORMAL HIGH (ref 0.0–4.7)

## 2020-12-15 LAB — T4: T4, Total: 8.9 ug/dL (ref 4.5–12.0)

## 2020-12-18 ENCOUNTER — Encounter: Payer: Self-pay | Admitting: Oncology

## 2020-12-18 ENCOUNTER — Other Ambulatory Visit: Payer: Self-pay | Admitting: Oncology

## 2020-12-18 ENCOUNTER — Other Ambulatory Visit: Payer: Self-pay | Admitting: Pharmacist

## 2020-12-18 DIAGNOSIS — J45998 Other asthma: Secondary | ICD-10-CM | POA: Diagnosis not present

## 2020-12-18 DIAGNOSIS — E871 Hypo-osmolality and hyponatremia: Secondary | ICD-10-CM

## 2020-12-18 DIAGNOSIS — C3432 Malignant neoplasm of lower lobe, left bronchus or lung: Secondary | ICD-10-CM

## 2020-12-18 DIAGNOSIS — E222 Syndrome of inappropriate secretion of antidiuretic hormone: Secondary | ICD-10-CM

## 2020-12-18 NOTE — Progress Notes (Signed)
START ON PATHWAY REGIMEN - Small Cell Lung     Cycles 1 through 4, every 21 days:     Atezolizumab      Carboplatin      Etoposide    Cycles 5 and beyond, every 21 days:     Atezolizumab   **Always confirm dose/schedule in your pharmacy ordering system**  Patient Characteristics: Relapsed or Progressive Disease, Second Line, Relapse > 6 Months Therapeutic Status: Relapsed or Progressive Disease Line of Therapy: Second Line Time to Relapse: Relapse > 6 Months Intent of Therapy: Non-Curative / Palliative Intent, Discussed with Patient

## 2020-12-18 NOTE — Progress Notes (Signed)
Sent in request for DOS 10/03, 10/04, 10/05, and 10/07 to [employer].

## 2020-12-19 ENCOUNTER — Encounter: Payer: Self-pay | Admitting: Oncology

## 2020-12-20 ENCOUNTER — Other Ambulatory Visit: Payer: Self-pay | Admitting: Oncology

## 2020-12-21 ENCOUNTER — Telehealth: Payer: Self-pay

## 2020-12-21 NOTE — Progress Notes (Signed)
Sent in request for DOS 09/30 to [employer].

## 2020-12-21 NOTE — Telephone Encounter (Signed)
-----   Message from Derwood Kaplan, MD sent at 12/19/2020  2:17 PM EDT ----- Regarding: call pt Pls tell Gabrielle Hudson that I am sorry, but can't do the report for the [facility].  They require a separate appt just for that evaluation and have to have neurologic and musculoskeletal eval with strength measurements, testing her walking, etc.  I don't have time for that, no appts anyway. Let me know if she needs the paper back and I recommend she reschedule with S. Hudnell (she had to cancel last one due to being in the hospital)

## 2020-12-22 DIAGNOSIS — E890 Postprocedural hypothyroidism: Secondary | ICD-10-CM | POA: Diagnosis not present

## 2020-12-22 DIAGNOSIS — I2510 Atherosclerotic heart disease of native coronary artery without angina pectoris: Secondary | ICD-10-CM | POA: Diagnosis not present

## 2020-12-22 DIAGNOSIS — K7689 Other specified diseases of liver: Secondary | ICD-10-CM | POA: Diagnosis not present

## 2020-12-22 DIAGNOSIS — Z85038 Personal history of other malignant neoplasm of large intestine: Secondary | ICD-10-CM | POA: Diagnosis not present

## 2020-12-22 DIAGNOSIS — C3432 Malignant neoplasm of lower lobe, left bronchus or lung: Secondary | ICD-10-CM | POA: Diagnosis not present

## 2020-12-22 DIAGNOSIS — K5730 Diverticulosis of large intestine without perforation or abscess without bleeding: Secondary | ICD-10-CM | POA: Diagnosis not present

## 2020-12-22 DIAGNOSIS — C3490 Malignant neoplasm of unspecified part of unspecified bronchus or lung: Secondary | ICD-10-CM | POA: Diagnosis not present

## 2020-12-22 MED FILL — Dexamethasone Sodium Phosphate Inj 100 MG/10ML: INTRAMUSCULAR | Qty: 1 | Status: AC

## 2020-12-22 MED FILL — Atezolizumab IV Soln 1200 MG/20ML: INTRAVENOUS | Qty: 20 | Status: AC

## 2020-12-22 MED FILL — Etoposide Inj 1 GM/50ML (20 MG/ML): INTRAVENOUS | Qty: 8 | Status: AC

## 2020-12-22 MED FILL — Carboplatin IV Soln 450 MG/45ML: INTRAVENOUS | Qty: 36 | Status: AC

## 2020-12-22 MED FILL — Fosaprepitant Dimeglumine For IV Infusion 150 MG (Base Eq): INTRAVENOUS | Qty: 5 | Status: AC

## 2020-12-24 ENCOUNTER — Encounter: Payer: Self-pay | Admitting: Oncology

## 2020-12-25 ENCOUNTER — Encounter (INDEPENDENT_AMBULATORY_CARE_PROVIDER_SITE_OTHER): Payer: Self-pay

## 2020-12-25 ENCOUNTER — Other Ambulatory Visit: Payer: Self-pay | Admitting: Hematology and Oncology

## 2020-12-25 ENCOUNTER — Other Ambulatory Visit: Payer: Self-pay | Admitting: Pharmacist

## 2020-12-25 ENCOUNTER — Inpatient Hospital Stay: Payer: Medicare Other | Attending: Hematology and Oncology

## 2020-12-25 ENCOUNTER — Other Ambulatory Visit: Payer: Self-pay

## 2020-12-25 ENCOUNTER — Ambulatory Visit: Payer: Medicare Other

## 2020-12-25 ENCOUNTER — Inpatient Hospital Stay (INDEPENDENT_AMBULATORY_CARE_PROVIDER_SITE_OTHER): Payer: Medicare Other | Admitting: Hematology and Oncology

## 2020-12-25 ENCOUNTER — Telehealth: Payer: Self-pay | Admitting: Oncology

## 2020-12-25 ENCOUNTER — Telehealth: Payer: Self-pay

## 2020-12-25 ENCOUNTER — Inpatient Hospital Stay: Payer: Medicare Other

## 2020-12-25 VITALS — BP 127/63 | HR 97 | Temp 98.0°F | Resp 24 | Ht 63.0 in | Wt 213.0 lb

## 2020-12-25 DIAGNOSIS — C3411 Malignant neoplasm of upper lobe, right bronchus or lung: Secondary | ICD-10-CM | POA: Diagnosis not present

## 2020-12-25 DIAGNOSIS — Z23 Encounter for immunization: Secondary | ICD-10-CM | POA: Diagnosis not present

## 2020-12-25 DIAGNOSIS — Z87891 Personal history of nicotine dependence: Secondary | ICD-10-CM | POA: Insufficient documentation

## 2020-12-25 DIAGNOSIS — C3432 Malignant neoplasm of lower lobe, left bronchus or lung: Secondary | ICD-10-CM

## 2020-12-25 DIAGNOSIS — E222 Syndrome of inappropriate secretion of antidiuretic hormone: Secondary | ICD-10-CM

## 2020-12-25 DIAGNOSIS — Z5111 Encounter for antineoplastic chemotherapy: Secondary | ICD-10-CM | POA: Diagnosis not present

## 2020-12-25 DIAGNOSIS — Z7901 Long term (current) use of anticoagulants: Secondary | ICD-10-CM | POA: Diagnosis not present

## 2020-12-25 DIAGNOSIS — J449 Chronic obstructive pulmonary disease, unspecified: Secondary | ICD-10-CM | POA: Diagnosis not present

## 2020-12-25 DIAGNOSIS — Z86711 Personal history of pulmonary embolism: Secondary | ICD-10-CM | POA: Insufficient documentation

## 2020-12-25 DIAGNOSIS — E039 Hypothyroidism, unspecified: Secondary | ICD-10-CM | POA: Insufficient documentation

## 2020-12-25 DIAGNOSIS — E871 Hypo-osmolality and hyponatremia: Secondary | ICD-10-CM | POA: Insufficient documentation

## 2020-12-25 DIAGNOSIS — Z5189 Encounter for other specified aftercare: Secondary | ICD-10-CM | POA: Diagnosis not present

## 2020-12-25 DIAGNOSIS — Z5112 Encounter for antineoplastic immunotherapy: Secondary | ICD-10-CM | POA: Diagnosis not present

## 2020-12-25 DIAGNOSIS — D241 Benign neoplasm of right breast: Secondary | ICD-10-CM | POA: Diagnosis not present

## 2020-12-25 DIAGNOSIS — Z79899 Other long term (current) drug therapy: Secondary | ICD-10-CM | POA: Diagnosis not present

## 2020-12-25 DIAGNOSIS — Z85038 Personal history of other malignant neoplasm of large intestine: Secondary | ICD-10-CM | POA: Diagnosis not present

## 2020-12-25 DIAGNOSIS — C7951 Secondary malignant neoplasm of bone: Secondary | ICD-10-CM | POA: Diagnosis not present

## 2020-12-25 DIAGNOSIS — Z9981 Dependence on supplemental oxygen: Secondary | ICD-10-CM | POA: Diagnosis not present

## 2020-12-25 DIAGNOSIS — D649 Anemia, unspecified: Secondary | ICD-10-CM | POA: Diagnosis not present

## 2020-12-25 LAB — CBC AND DIFFERENTIAL
HCT: 32 — AB (ref 36–46)
Hemoglobin: 10.7 — AB (ref 12.0–16.0)
Neutrophils Absolute: 2.77
Platelets: 239 (ref 150–399)
WBC: 4.4

## 2020-12-25 LAB — COMPREHENSIVE METABOLIC PANEL
Albumin: 4.2 (ref 3.5–5.0)
Calcium: 8.9 (ref 8.7–10.7)

## 2020-12-25 LAB — BASIC METABOLIC PANEL
BUN: 12 (ref 4–21)
CO2: 27 — AB (ref 13–22)
Chloride: 84 — AB (ref 99–108)
Creatinine: 0.8 (ref 0.5–1.1)
Glucose: 114
Potassium: 4.1 (ref 3.4–5.3)
Sodium: 119 — AB (ref 137–147)

## 2020-12-25 LAB — HEPATIC FUNCTION PANEL
ALT: 12 (ref 7–35)
AST: 23 (ref 13–35)
Alkaline Phosphatase: 95 (ref 25–125)
Bilirubin, Total: 0.3

## 2020-12-25 LAB — TSH: TSH: 10.808 u[IU]/mL — ABNORMAL HIGH (ref 0.350–4.500)

## 2020-12-25 LAB — CBC: RBC: 3.67 — AB (ref 3.87–5.11)

## 2020-12-25 MED ORDER — SODIUM CHLORIDE 0.9 % IV SOLN
75.0000 mg/m2 | Freq: Once | INTRAVENOUS | Status: AC
  Administered 2020-12-25: 160 mg via INTRAVENOUS
  Filled 2020-12-25: qty 8

## 2020-12-25 MED ORDER — FAMOTIDINE 20 MG IN NS 100 ML IVPB
20.0000 mg | Freq: Once | INTRAVENOUS | Status: AC
  Administered 2020-12-25: 20 mg via INTRAVENOUS
  Filled 2020-12-25: qty 100

## 2020-12-25 MED ORDER — SODIUM CHLORIDE 0.9 % IV SOLN
1200.0000 mg | Freq: Once | INTRAVENOUS | Status: AC
  Administered 2020-12-25: 1200 mg via INTRAVENOUS
  Filled 2020-12-25: qty 20

## 2020-12-25 MED ORDER — INFLUENZA VAC SPLIT QUAD 0.5 ML IM SUSY
0.5000 mL | PREFILLED_SYRINGE | Freq: Once | INTRAMUSCULAR | Status: AC
  Administered 2020-12-25: 0.5 mL via INTRAMUSCULAR
  Filled 2020-12-25: qty 0.5

## 2020-12-25 MED ORDER — SODIUM CHLORIDE 0.9 % IV SOLN
364.4500 mg | Freq: Once | INTRAVENOUS | Status: AC
  Administered 2020-12-25: 360 mg via INTRAVENOUS
  Filled 2020-12-25: qty 36

## 2020-12-25 MED ORDER — HEPARIN SOD (PORK) LOCK FLUSH 100 UNIT/ML IV SOLN
500.0000 [IU] | Freq: Once | INTRAVENOUS | Status: AC | PRN
  Administered 2020-12-25: 500 [IU]

## 2020-12-25 MED ORDER — SODIUM CHLORIDE 0.9% FLUSH
10.0000 mL | INTRAVENOUS | Status: DC | PRN
Start: 2020-12-25 — End: 2020-12-25
  Administered 2020-12-25: 10 mL

## 2020-12-25 MED ORDER — SODIUM CHLORIDE 0.9 % IV SOLN: Freq: Once | INTRAVENOUS | Status: AC

## 2020-12-25 MED ORDER — DIPHENHYDRAMINE HCL 50 MG/ML IJ SOLN
50.0000 mg | Freq: Once | INTRAMUSCULAR | Status: AC
  Administered 2020-12-25: 50 mg via INTRAVENOUS
  Filled 2020-12-25: qty 1

## 2020-12-25 MED ORDER — SODIUM CHLORIDE 0.9 % IV SOLN
10.0000 mg | Freq: Once | INTRAVENOUS | Status: AC
  Administered 2020-12-25: 10 mg via INTRAVENOUS
  Filled 2020-12-25: qty 10

## 2020-12-25 MED ORDER — SODIUM CHLORIDE 0.9 % IV SOLN
150.0000 mg | Freq: Once | INTRAVENOUS | Status: AC
  Administered 2020-12-25: 150 mg via INTRAVENOUS
  Filled 2020-12-25: qty 150

## 2020-12-25 MED ORDER — PALONOSETRON HCL INJECTION 0.25 MG/5ML
0.2500 mg | Freq: Once | INTRAVENOUS | Status: AC
  Administered 2020-12-25: 0.25 mg via INTRAVENOUS
  Filled 2020-12-25: qty 5

## 2020-12-25 MED FILL — Dexamethasone Sodium Phosphate Inj 100 MG/10ML: INTRAMUSCULAR | Qty: 1 | Status: AC

## 2020-12-25 MED FILL — Etoposide Inj 1 GM/50ML (20 MG/ML): INTRAVENOUS | Qty: 8 | Status: AC

## 2020-12-25 NOTE — Progress Notes (Signed)
Sent in request for DOS 10/14 to [employer].

## 2020-12-25 NOTE — Progress Notes (Signed)
[place]  [phone] Fax:([phone]  Patient Care Team: Helen Hashimoto., MD as PCP - General (Internal Medicine) Gardiner Rhyme, MD as Attending Physician (Pulmonary Disease) Governor Rooks., DO as Attending Physician (General Surgery)   Name of the patient: Gabrielle Hudson  [id number]  1940-04-15   Date of visit: 12/25/20  Diagnosis- Small cell lung cancer  Chief complaint/Reason for visit- Initial Meeting for [facility], preparing for starting chemotherapy  Heme/Onc history:  Oncology History  Small cell carcinoma of lower lobe of left lung ([place])  01/20/2020 Initial Diagnosis   Lung cancer, lower lobe ([place])   01/20/2020 Cancer Staging   Staging form: Lung, AJCC 8th Edition - Clinical: Stage IIIB (cT3, cN2, cM0) - Signed by Derwood Kaplan, MD on 01/20/2020   01/21/2020 Cancer Staging   Staging form: Lung, AJCC 8th Edition - Pathologic stage from 01/21/2020: Stage IIIB (pT3, pN2, cM0) - Signed by Derwood Kaplan, MD on 01/21/2020   01/31/2020 - 04/28/2020 Chemotherapy          06/08/2020 - 10/13/2020 Chemotherapy          12/25/2020 -  Chemotherapy   Patient is on Treatment Plan : LUNG SCLC Carboplatin + Etoposide + Atezolizumab Induction q21d / Atezolizumab Maintenance q21d     Secondary malignant neoplasm of bone and bone marrow ([place])    Interval history-  Patient presents to chemo care clinic today for initial meeting in preparation for starting chemotherapy. I introduced the chemo care clinic and we discussed that the role of the clinic is to assist those who are at an increased risk of emergency room visits and/or complications during the course of chemotherapy treatment. We discussed that the increased risk takes into account factors such as age, performance status, and co-morbidities. We also discussed that for some, this might include barriers to care such as not  having a primary care provider, lack of insurance/transportation, or not being able to afford medications. We discussed that the goal of the program is to help prevent unplanned ER visits and help reduce complications during chemotherapy. We do this by discussing specific risk factors to each individual and identifying ways that we can help improve these risk factors and reduce barriers to care.   Allergies  Allergen Reactions   Nitroglycerin In D5w Other (See Comments)    "flatlines"   Nitroglycerin Other (See Comments)    Drops her BP 'flatlines'     Past Medical History:  Diagnosis Date   Anemia, unspecified    Anemia, unspecified    Cancer ([place]) 01/2009   SCC of the lung   COPD (chronic obstructive pulmonary disease) ([place]) 2-3 LPM   GERD (gastroesophageal reflux disease)    Hyposmolality syndrome    Hyposmolality syndrome    Hypothyroidism    Malignant neoplasm of overlapping sites of right lung (HCC)    Malignant neoplasm of overlapping sites of right lung ([place])    Malignant neoplasm of sigmoid colon (HCC)    Malignant neoplasm of sigmoid colon (HCC)    Shortness of breath    Sleep apnea     Past Surgical History:  Procedure Laterality Date   COLON SURGERY     PORTACATH PLACEMENT  2010    Social History   Socioeconomic History   Marital status: Widowed    Spouse name: Not on file   Number of children: 1   Years of education: Not on file  Highest education level: Not on file  Occupational History   Not on file  Tobacco Use   Smoking status: Former    Types: Cigarettes    Quit date: 02/23/2008    Years since quitting: 12.8   Smokeless tobacco: Never  Vaping Use   Vaping Use: Never used  Substance and Sexual Activity   Alcohol use: No   Drug use: No   Sexual activity: Never    Birth control/protection: Post-menopausal  Other Topics Concern   Not on file  Social History Narrative   Not on file   Social Determinants of Health   Financial Resource  Strain: Not on file  Food Insecurity: No Food Insecurity   Worried About Running Out of Food in the Last Year: Never true   [place] in the Last Year: Never true  Transportation Needs: No Transportation Needs   Lack of Transportation (Medical): No   Lack of Transportation (Non-Medical): No  Physical Activity: Unknown   Days of Exercise per Week: Not on file   Minutes of Exercise per Session: 0 min  Stress: Not on file  Social Connections: Not on file  Intimate Partner Violence: Not At Risk   Fear of Current or Ex-Partner: No   Emotionally Abused: No   Physically Abused: No   Sexually Abused: No    Family History  Problem Relation Age of Onset   Colon cancer Father    Prostate cancer Father      Current Outpatient Medications:    albuterol (PROVENTIL) (5 MG/ML) 0.5% nebulizer solution, Take 2.5 mg by nebulization daily., Disp: , Rfl:    albuterol (VENTOLIN HFA) 108 (90 Base) MCG/ACT inhaler, , Disp: , Rfl:    budesonide-formoterol (SYMBICORT) 160-4.5 MCG/ACT inhaler, Inhale 2 puffs into the lungs 2 (two) times daily.  , Disp: , Rfl:    calcium citrate-vitamin D (CITRACAL+D) 315-200 MG-UNIT tablet, Take 1 tablet by mouth 2 (two) times daily., Disp: , Rfl:    Cholecalciferol (VITAMIN D3) 1.25 MG (50000 UT) CAPS, , Disp: , Rfl:    cyclobenzaprine (FLEXERIL) 10 MG tablet, Take 1 tablet (10 mg total) by mouth 3 (three) times daily as needed for muscle spasms., Disp: 30 tablet, Rfl: 3   diltiazem (CARDIZEM CD) 120 MG 24 hr capsule, Take 120 mg by mouth daily., Disp: , Rfl:    ELIQUIS 5 MG TABS tablet, Take 1 tablet by mouth twice daily, Disp: 60 tablet, Rfl: 2   EUTHYROX 88 MCG tablet, Take 88 mcg by mouth daily., Disp: , Rfl:    famotidine (PEPCID) 20 MG tablet, Take 20 mg by mouth 2 (two) times daily., Disp: , Rfl:    fluticasone (FLONASE) 50 MCG/ACT nasal spray, Place into both nostrils., Disp: , Rfl:    furosemide (LASIX) 20 MG tablet, Take 20 mg by mouth as needed. ONLY  TAKES IT HAVING SWELLING, Disp: , Rfl:    gabapentin (NEURONTIN) 300 MG capsule, Take 300 mg by mouth as needed., Disp: , Rfl:    guaiFENesin (MUCINEX) 600 MG 12 hr tablet, Take 1 tablet by mouth every 12 (twelve) hours. (Patient not taking: Reported on 10/10/2020), Disp: , Rfl:    meclizine (ANTIVERT) 25 MG tablet, Take 0.5-1 tablets (12.5-25 mg total) by mouth 3 (three) times daily as needed for dizziness., Disp: 30 tablet, Rfl: 0   ondansetron (ZOFRAN) 4 MG tablet, Take 1 tablet (4 mg total) by mouth every 4 (four) hours as needed for nausea., Disp: 90 tablet, Rfl: 3  OXYGEN, Inhale 3 L into the lungs continuous., Disp: , Rfl:    Potassium Chloride ER 20 MEQ TBCR, Take 1 tablet by mouth 2 (two) times daily., Disp: , Rfl:    prochlorperazine (COMPAZINE) 10 MG tablet, Take 1 tablet (10 mg total) by mouth every 6 (six) hours as needed for nausea or vomiting., Disp: 90 tablet, Rfl: 3   rOPINIRole (REQUIP) 0.5 MG tablet, Take 0.5 mg by mouth at bedtime., Disp: , Rfl:    sodium chloride 1 g tablet, Take 1 g by mouth 2 (two) times daily., Disp: , Rfl:    Vitamin D, Ergocalciferol, (DRISDOL) 1.25 MG (50000 UNIT) CAPS capsule, Take 50,000 Units by mouth once a week., Disp: , Rfl:  No current facility-administered medications for this visit.  Facility-Administered Medications Ordered in Other Visits:    atezolizumab (TECENTRIQ) 1,200 mg in sodium chloride 0.9 % 250 mL chemo infusion, 1,200 mg, Intravenous, Once, Hinton Rao, Chinita Pester, MD   CARBOplatin (PARAPLATIN) 360 mg in sodium chloride 0.9 % 100 mL chemo infusion, 360 mg, Intravenous, Once, Derwood Kaplan, MD   dexamethasone (DECADRON) 10 mg in sodium chloride 0.9 % 50 mL IVPB, 10 mg, Intravenous, Once, Derwood Kaplan, MD, Last Rate: 204 mL/hr at 12/25/20 1417, 10 mg at 12/25/20 1417   etoposide (VEPESID) 160 mg in sodium chloride 0.9 % 500 mL chemo infusion, 75 mg/m2 (Treatment Plan Recorded), Intravenous, Once, Derwood Kaplan, MD    heparin lock flush 100 unit/mL, 500 Units, Intracatheter, Once PRN, Derwood Kaplan, MD   influenza vac split quadrivalent PF (FLUARIX) injection 0.5 mL, 0.5 mL, Intramuscular, Once, Derwood Kaplan, MD   sodium chloride flush (NS) 0.9 % injection 10 mL, 10 mL, Intracatheter, PRN, Derwood Kaplan, MD  CMP Latest Ref Rng & Units 12/14/2020  Glucose 70 - 99 mg/dL -  BUN 4 - 21 16  Creatinine 0.5 - 1.1 1.0  Sodium 137 - 147 135(A)  Potassium 3.4 - 5.3 4.3  Chloride 99 - 108 102  CO2 13 - 22 24(A)  Calcium 8.7 - 10.7 8.6(A)  Total Protein 6.5 - 8.1 g/dL -  Total Bilirubin 0.3 - 1.2 mg/dL -  Alkaline Phos 25 - 125 108  AST 13 - 35 34  ALT 7 - 35 25   CBC Latest Ref Rng & Units 12/14/2020  WBC - 6.3  Hemoglobin 12.0 - 16.0 11.1(A)  Hematocrit 36 - 46 34(A)  Platelets 150 - 399 208    No images are attached to the encounter.  No results found.   Assessment and plan- Patient is a 80 y.o. female who presents to [facility] for initial meeting in preparation for starting chemotherapy for the treatment of lung cancer.   Chemo Care Clinic/High Risk for ER/Hospitalization during chemotherapy- We discussed the role of the chemo care clinic and identified patient specific risk factors. I discussed that patient was identified as high risk primarily based on:  Patient has past medical history positive for: Past Medical History:  Diagnosis Date   Anemia, unspecified    Anemia, unspecified    Cancer ([place]) 01/2009   SCC of the lung   COPD (chronic obstructive pulmonary disease) ([place]) 2-3 LPM   GERD (gastroesophageal reflux disease)    Hyposmolality syndrome    Hyposmolality syndrome    Hypothyroidism    Malignant neoplasm of overlapping sites of right lung ([place])    Malignant neoplasm of overlapping sites of right lung ([place])    Malignant neoplasm  of sigmoid colon ([place])    Malignant neoplasm of sigmoid colon ([place])    Shortness of breath    Sleep apnea     Patient  has past surgical history positive for: Past Surgical History:  Procedure Laterality Date   COLON SURGERY     PORTACATH PLACEMENT  2010   The patient is a with newly diagnosed.  Patient presents to clinic today for chemotherapy education and palliative care consult.  We will start.  We will send in prescriptions for prochlorperazine and ondansetron.  The patient verbalizes understanding of and agreement to the plan as discussed today.  Provided general information including the following: 1.  Date of education: 12/25/2020 2.  Physician name: Dr. Hinton Rao 3.  Diagnosis: Small cell lung cancer 4.  Stage: stage IIIB 5.  Palliative 6.  Chemotherapy plan including drugs and how often: Carboplatin/ Etoposide/ Atezolizumab 7.  Start date: 12/25/2020 8.  Other referrals: None at this time 9.  The patient is to call our office with any questions or concerns.  Our office number [phone], if after hours or on the weekend, call the same number and wait for the answering service.  There is always an oncologist on call 10.  Medications prescribed: 11.  The patient has verbalized understanding of the treatment plan and has no barriers to adherence or understanding.  Obtained signed consent from patient.  Discussed symptoms including 1.  Low blood counts including red blood cells, white blood cells and platelets. 2. Infection including to avoid large crowds, wash hands frequently, and stay away from people who were sick.  If fever develops of 100.4 or higher, call our office. 3.  Mucositis-given instructions on mouth rinse (baking soda and salt mixture).  Keep mouth clean.  Use soft bristle toothbrush.  If mouth sores develop, call our clinic. 4.  Nausea/vomiting-gave prescriptions for ondansetron 4 mg every 4 hours as needed for nausea, may take around the clock if persistent.  Compazine 10 mg every 6 hours, may take around the clock if persistent. 5.  Diarrhea-use over-the-counter Imodium.  Call  clinic if not controlled. 6.  Constipation-use senna, 1 to 2 tablets twice a day.  If no BM in 2 to 3 days call the clinic. 7.  Loss of appetite-try to eat small meals every 2-3 hours.  Call clinic if not eating. 8.  Taste changes-zinc 500 mg daily.  If becomes severe call clinic. 9.  Alcoholic beverages. 10.  Drink 2 to 3 quarts of water per day. 11.  Peripheral neuropathy-patient to call if numbness or tingling in hands or feet is persistent  Neulasta-will be given 24 to 48 hours after chemotherapy.  Gave information sheet on bone and joint pain.  Use Claritin or Pepcid.  May use ibuprofen or Aleve.  Call if symptoms persist or are unbearable.   Answered questions to patient satisfaction.  Patient is to call with any further questions or concerns.  Time spent on this palliative care/chemotherapy education was 60 minutes with more than 50% spent discussing diagnosis, prognosis and symptom management.  The medication prescribed to the patient will be printed out from Epic referenes This will give the following information: Name of your medication Approved uses Dose and schedule Storage and handling Handling body fluids and waste Drug and food interactions Possible side effects and management Pregnancy, sexual activity, and contraception Obtaining medication   We discussed that social determinants of health may have significant impacts on health and outcomes for cancer patients.  Today we  discussed specific social determinants of performance status, alcohol use, depression, financial needs, food insecurity, housing, interpersonal violence, social connections, stress, tobacco use, and transportation.    After lengthy discussion the following were identified as areas of need:   Outpatient services: We discussed options including home based and outpatient services, DME and care program. We discusssed that patients who participate in regular physical activity report fewer negative impacts  of cancer and treatments and report less fatigue.   Financial Concerns: We discussed that living with cancer can create tremendous financial burden.  We discussed options for assistance. I asked that if assistance is needed in affording medications or paying bills to please let us know so that we can provide assistance. We discussed options for food including social services, [employer] ($50 every 2 weeks) and onsite food pantry.  We will also notify [facility] to see if cancer center can provide additional support.  Referral to Social work: Introduced Education officer, museum Elease Etienne and the services he can provide such as support with utility bill, cell phone and gas vouchers.   Support groups: We discussed options for support groups at the cancer center. If interested, please notify nurse navigator to enroll. We discussed options for managing stress including healthy eating, exercise as well as participating in no charge counseling services at the cancer center and support groups.  If these are of interest, patient can notify either myself or primary nursing team.We discussed options for management including medications and referral to quit Smart program  Transportation: We discussed options for transportation including [employer], paratransit, bus routes, link transit, taxi/uber/lyft, and cancer center van.  I have notified primary oncology team who will help assist with arranging Lucianne Lei transportation for appointments when/if needed. We also discussed options for transportation on short notice/acute visits.  Palliative care services: We have palliative care services available in the cancer center to discuss goals of care and advanced care planning.  Please let us know if you have any questions or would like to speak to our palliative nurse practitioner.  Symptom Management Clinic: We discussed our symptom management clinic which is available for acute concerns while receiving treatment such as nausea,  vomiting or diarrhea.  We can be reached via telephone at [phone] or through my chart.  We are available for virtual or in person visits on the same day from 830 to 4 PM Monday through Friday. She denies needing specific assistance at this time and She will be followed by Dr. Hinton Rao clinical team.  Plan: Discussed symptom management clinic. Discussed palliative care services. Discussed resources that are available here at the cancer center. Discussed medications and new prescriptions to begin treatment such as anti-nausea or steroids.   Disposition: RTC on   Visit Diagnosis No diagnosis found.  Patient expressed understanding and was in agreement with this plan. She also understands that She can call clinic at any time with any questions, concerns, or complaints.   I provided 30 minutes of  face to face  during this encounter, and > 50% was spent counseling as documented under my assessment & plan.   Dayton Scrape, FNP- [facility]

## 2020-12-25 NOTE — Progress Notes (Addendum)
..  Pharmacist Chemotherapy Monitoring - Initial Assessment    Anticipated start date: 12/25/20  The following has been reviewed per standard work regarding the patient's treatment regimen: The patient's diagnosis, treatment plan and drug doses, and organ/hematologic function Lab orders and baseline tests specific to treatment regimen  The treatment plan start date, drug sequencing, and pre-medications Prior authorization status  Patient's documented medication list, including drug-drug interaction screen and prescriptions for anti-emetics and supportive care specific to the treatment regimen The drug concentrations, fluid compatibility, administration routes, and timing of the medications to be used The patient's access for treatment and lifetime cumulative dose history, if applicable  The patient's medication allergies and previous infusion related reactions, if applicable   Changes made to treatment plan:  Add diphenhydramine and famotidine since 6+ carbo doses.  Follow up needed:  N/A   Gabrielle Hudson, [facility], 12/22/2020  2:59 PM

## 2020-12-25 NOTE — Telephone Encounter (Signed)
Chemo Edu

## 2020-12-25 NOTE — Patient Instructions (Signed)
[facility]  Discharge Instructions: Thank you for choosing [facility] to provide your oncology and hematology care.  If you have a lab appointment with the [facility], please go directly to the [facility] and check in at the registration area.   Wear comfortable clothing and clothing appropriate for easy access to any Portacath or PICC line.   We strive to give you quality time with your provider. You may need to reschedule your appointment if you arrive late (15 or more minutes).  Arriving late affects you and other patients whose appointments are after yours.  Also, if you miss three or more appointments without notifying the office, you may be dismissed from the clinic at the provider's discretion.      For prescription refill requests, have your pharmacy contact our office and allow 72 hours for refills to be completed.    Today you received the following chemotherapy and/or immunotherapy agents atezolizumab/etoposie/carbo   To help prevent nausea and vomiting after your treatment, we encourage you to take your nausea medication as directed.  BELOW ARE SYMPTOMS THAT SHOULD BE REPORTED IMMEDIATELY: *FEVER GREATER THAN 100.4 F (38 C) OR HIGHER *CHILLS OR SWEATING *NAUSEA AND VOMITING THAT IS NOT CONTROLLED WITH YOUR NAUSEA MEDICATION *UNUSUAL SHORTNESS OF BREATH *UNUSUAL BRUISING OR BLEEDING *URINARY PROBLEMS (pain or burning when urinating, or frequent urination) *BOWEL PROBLEMS (unusual diarrhea, constipation, pain near the anus) TENDERNESS IN MOUTH AND THROAT WITH OR WITHOUT PRESENCE OF ULCERS (sore throat, sores in mouth, or a toothache) UNUSUAL RASH, SWELLING OR PAIN  UNUSUAL VAGINAL DISCHARGE OR ITCHING   Items with * indicate a potential emergency and should be followed up as soon as possible or go to the Emergency Department if any problems should occur.  Please show the CHEMOTHERAPY ALERT CARD or IMMUNOTHERAPY ALERT CARD at  check-in to the Emergency Department and triage nurse.  Should you have questions after your visit or need to cancel or reschedule your appointment, please contact [facility]  Dept: [phone]  and follow the prompts.  Office hours are 8:00 a.m. to 4:30 p.m. Monday - Friday. Please note that voicemails left after 4:00 p.m. may not be returned until the following business day.  We are closed weekends and major holidays. You have access to a nurse at all times for urgent questions. Please call the main number to the clinic Dept: [phone] and follow the prompts.  For any non-urgent questions, you may also contact your provider using MyChart. We now offer e-Visits for anyone 89 and older to request care online for non-urgent symptoms. For details visit [email or web address].   Also download the MyChart app! Go to the app store, search "MyChart", open the app, select [facility], and log in with your MyChart username and password.  Due to Covid, a mask is required upon entering the hospital/clinic. If you do not have a mask, one will be given to you upon arrival. For doctor visits, patients may have 1 support person aged 10 or older with them. For treatment visits, patients cannot have anyone with them due to current Covid guidelines and our immunocompromised population.   Atezolizumab injection What is this medication? ATEZOLIZUMAB (a te zoe LIZ ue mab) is a monoclonal antibody. It is used to treat bladder cancer (urothelial cancer), liver cancer, lung cancer, and melanoma. This medicine may be used for other purposes; ask your health care provider or pharmacist if you have questions. COMMON BRAND NAME(S):  Tecentriq What should I tell my care team before I take this medication? They need to know if you have any of these conditions: autoimmune diseases like Crohn's disease, ulcerative colitis, or lupus have had or planning to have an allogeneic stem cell transplant  (uses someone else's stem cells) history of organ transplant history of radiation to the chest nervous system problems like myasthenia gravis or Guillain-Barre syndrome an unusual or allergic reaction to atezolizumab, other medicines, foods, dyes, or preservatives pregnant or trying to get pregnant breast-feeding How should I use this medication? This medicine is for infusion into a vein. It is given by a health care professional in a hospital or clinic setting. A special MedGuide will be given to you before each treatment. Be sure to read this information carefully each time. Talk to your pediatrician regarding the use of this medicine in children. Special care may be needed. Overdosage: If you think you have taken too much of this medicine contact a poison control center or emergency room at once. NOTE: This medicine is only for you. Do not share this medicine with others. What if I miss a dose? It is important not to miss your dose. Call your doctor or health care professional if you are unable to keep an appointment. What may interact with this medication? Interactions have not been studied. This list may not describe all possible interactions. Give your health care provider a list of all the medicines, herbs, non-prescription drugs, or dietary supplements you use. Also tell them if you smoke, drink alcohol, or use illegal drugs. Some items may interact with your medicine. What should I watch for while using this medication? Your condition will be monitored carefully while you are receiving this medicine. You may need blood work done while you are taking this medicine. Do not become pregnant while taking this medicine or for at least 5 months after stopping it. Women should inform their doctor if they wish to become pregnant or think they might be pregnant. There is a potential for serious side effects to an unborn child. Talk to your health care professional or pharmacist for more  information. Do not breast-feed an infant while taking this medicine or for at least 5 months after the last dose. What side effects may I notice from receiving this medication? Side effects that you should report to your doctor or health care professional as soon as possible: allergic reactions like skin rash, itching or hives, swelling of the face, lips, or tongue black, tarry stools bloody or watery diarrhea breathing problems changes in vision chest pain or chest tightness chills facial flushing fever headache signs and symptoms of high blood sugar such as dizziness; dry mouth; dry skin; fruity breath; nausea; stomach pain; increased hunger or thirst; increased urination signs and symptoms of liver injury like dark yellow or brown urine; general ill feeling or flu-like symptoms; light-colored stools; loss of appetite; nausea; right upper belly pain; unusually weak or tired; yellowing of the eyes or skin stomach pain trouble passing urine or change in the amount of urine Side effects that usually do not require medical attention (report to your doctor or health care professional if they continue or are bothersome): bone pain cough diarrhea joint pain muscle pain muscle weakness swelling of arms or legs tiredness weight loss This list may not describe all possible side effects. Call your doctor for medical advice about side effects. You may report side effects to FDA at 1-800-FDA-1088. Where should I keep my medication? This  drug is given in a hospital or clinic and will not be stored at home. NOTE: This sheet is a summary. It may not cover all possible information. If you have questions about this medicine, talk to your doctor, pharmacist, or health care provider.  2022 Elsevier/Gold Standard (2019-12-09 13:59:34) Etoposide, VP-16 injection What is this medication? ETOPOSIDE, VP-16 (e toe POE side) is a chemotherapy drug. It is used to treat testicular cancer, lung cancer, and  other cancers. This medicine may be used for other purposes; ask your health care provider or pharmacist if you have questions. COMMON BRAND NAME(S): Etopophos, Toposar, VePesid What should I tell my care team before I take this medication? They need to know if you have any of these conditions: infection kidney disease liver disease low blood counts, like low white cell, platelet, or red cell counts an unusual or allergic reaction to etoposide, other medicines, foods, dyes, or preservatives pregnant or trying to get pregnant breast-feeding How should I use this medication? This medicine is for infusion into a vein. It is administered in a hospital or clinic by a specially trained health care professional. Talk to your pediatrician regarding the use of this medicine in children. Special care may be needed. Overdosage: If you think you have taken too much of this medicine contact a poison control center or emergency room at once. NOTE: This medicine is only for you. Do not share this medicine with others. What if I miss a dose? It is important not to miss your dose. Call your doctor or health care professional if you are unable to keep an appointment. What may interact with this medication? This medicine may interact with the following medications: warfarin This list may not describe all possible interactions. Give your health care provider a list of all the medicines, herbs, non-prescription drugs, or dietary supplements you use. Also tell them if you smoke, drink alcohol, or use illegal drugs. Some items may interact with your medicine. What should I watch for while using this medication? Visit your doctor for checks on your progress. This drug may make you feel generally unwell. This is not uncommon, as chemotherapy can affect healthy cells as well as cancer cells. Report any side effects. Continue your course of treatment even though you feel ill unless your doctor tells you to stop. In  some cases, you may be given additional medicines to help with side effects. Follow all directions for their use. Call your doctor or health care professional for advice if you get a fever, chills or sore throat, or other symptoms of a cold or flu. Do not treat yourself. This drug decreases your body's ability to fight infections. Try to avoid being around people who are sick. This medicine may increase your risk to bruise or bleed. Call your doctor or health care professional if you notice any unusual bleeding. Talk to your doctor about your risk of cancer. You may be more at risk for certain types of cancers if you take this medicine. Do not become pregnant while taking this medicine or for at least 6 months after stopping it. Women should inform their doctor if they wish to become pregnant or think they might be pregnant. Women of child-bearing potential will need to have a negative pregnancy test before starting this medicine. There is a potential for serious side effects to an unborn child. Talk to your health care professional or pharmacist for more information. Do not breast-feed an infant while taking this medicine. Men must use  a latex condom during sexual contact with a woman while taking this medicine and for at least 4 months after stopping it. A latex condom is needed even if you have had a vasectomy. Contact your doctor right away if your partner becomes pregnant. Do not donate sperm while taking this medicine and for at least 4 months after you stop taking this medicine. Men should inform their doctors if they wish to father a child. This medicine may lower sperm counts. What side effects may I notice from receiving this medication? Side effects that you should report to your doctor or health care professional as soon as possible: allergic reactions like skin rash, itching or hives, swelling of the face, lips, or tongue low blood counts - this medicine may decrease the number of white blood  cells, red blood cells, and platelets. You may be at increased risk for infections and bleeding nausea, vomiting redness, blistering, peeling or loosening of the skin, including inside the mouth signs and symptoms of infection like fever; chills; cough; sore throat; pain or trouble passing urine signs and symptoms of low red blood cells or anemia such as unusually weak or tired; feeling faint or lightheaded; falls; breathing problems unusual bruising or bleeding Side effects that usually do not require medical attention (report to your doctor or health care professional if they continue or are bothersome): changes in taste diarrhea hair loss loss of appetite mouth sores This list may not describe all possible side effects. Call your doctor for medical advice about side effects. You may report side effects to FDA at 1-800-FDA-1088. Where should I keep my medication? This drug is given in a hospital or clinic and will not be stored at home. NOTE: This sheet is a summary. It may not cover all possible information. If you have questions about this medicine, talk to your doctor, pharmacist, or health care provider.  2022 Elsevier/Gold Standard (2018-05-06 16:57:15) Carboplatin injection What is this medication? CARBOPLATIN (KAR boe pla tin) is a chemotherapy drug. It targets fast dividing cells, like cancer cells, and causes these cells to die. This medicine is used to treat ovarian cancer and many other cancers. This medicine may be used for other purposes; ask your health care provider or pharmacist if you have questions. COMMON BRAND NAME(S): Paraplatin What should I tell my care team before I take this medication? They need to know if you have any of these conditions: blood disorders hearing problems kidney disease recent or ongoing radiation therapy an unusual or allergic reaction to carboplatin, cisplatin, other chemotherapy, other medicines, foods, dyes, or preservatives pregnant or  trying to get pregnant breast-feeding How should I use this medication? This drug is usually given as an infusion into a vein. It is administered in a hospital or clinic by a specially trained health care professional. Talk to your pediatrician regarding the use of this medicine in children. Special care may be needed. Overdosage: If you think you have taken too much of this medicine contact a poison control center or emergency room at once. NOTE: This medicine is only for you. Do not share this medicine with others. What if I miss a dose? It is important not to miss a dose. Call your doctor or health care professional if you are unable to keep an appointment. What may interact with this medication? medicines for seizures medicines to increase blood counts like filgrastim, pegfilgrastim, sargramostim some antibiotics like amikacin, gentamicin, neomycin, streptomycin, tobramycin vaccines Talk to your doctor or health care professional  before taking any of these medicines: acetaminophen aspirin ibuprofen ketoprofen naproxen This list may not describe all possible interactions. Give your health care provider a list of all the medicines, herbs, non-prescription drugs, or dietary supplements you use. Also tell them if you smoke, drink alcohol, or use illegal drugs. Some items may interact with your medicine. What should I watch for while using this medication? Your condition will be monitored carefully while you are receiving this medicine. You will need important blood work done while you are taking this medicine. This drug may make you feel generally unwell. This is not uncommon, as chemotherapy can affect healthy cells as well as cancer cells. Report any side effects. Continue your course of treatment even though you feel ill unless your doctor tells you to stop. In some cases, you may be given additional medicines to help with side effects. Follow all directions for their use. Call your  doctor or health care professional for advice if you get a fever, chills or sore throat, or other symptoms of a cold or flu. Do not treat yourself. This drug decreases your body's ability to fight infections. Try to avoid being around people who are sick. This medicine may increase your risk to bruise or bleed. Call your doctor or health care professional if you notice any unusual bleeding. Be careful brushing and flossing your teeth or using a toothpick because you may get an infection or bleed more easily. If you have any dental work done, tell your dentist you are receiving this medicine. Avoid taking products that contain aspirin, acetaminophen, ibuprofen, naproxen, or ketoprofen unless instructed by your doctor. These medicines may hide a fever. Do not become pregnant while taking this medicine. Women should inform their doctor if they wish to become pregnant or think they might be pregnant. There is a potential for serious side effects to an unborn child. Talk to your health care professional or pharmacist for more information. Do not breast-feed an infant while taking this medicine. What side effects may I notice from receiving this medication? Side effects that you should report to your doctor or health care professional as soon as possible: allergic reactions like skin rash, itching or hives, swelling of the face, lips, or tongue signs of infection - fever or chills, cough, sore throat, pain or difficulty passing urine signs of decreased platelets or bleeding - bruising, pinpoint red spots on the skin, black, tarry stools, nosebleeds signs of decreased red blood cells - unusually weak or tired, fainting spells, lightheadedness breathing problems changes in hearing changes in vision chest pain high blood pressure low blood counts - This drug may decrease the number of white blood cells, red blood cells and platelets. You may be at increased risk for infections and bleeding. nausea and  vomiting pain, swelling, redness or irritation at the injection site pain, tingling, numbness in the hands or feet problems with balance, talking, walking trouble passing urine or change in the amount of urine Side effects that usually do not require medical attention (report to your doctor or health care professional if they continue or are bothersome): hair loss loss of appetite metallic taste in the mouth or changes in taste This list may not describe all possible side effects. Call your doctor for medical advice about side effects. You may report side effects to FDA at 1-800-FDA-1088. Where should I keep my medication? This drug is given in a hospital or clinic and will not be stored at home. NOTE: This sheet is a  summary. It may not cover all possible information. If you have questions about this medicine, talk to your doctor, pharmacist, or health care provider.  2022 Elsevier/Gold Standard (2007-06-16 14:38:05)

## 2020-12-26 ENCOUNTER — Inpatient Hospital Stay: Payer: Medicare Other

## 2020-12-26 ENCOUNTER — Telehealth: Payer: Self-pay

## 2020-12-26 ENCOUNTER — Encounter: Payer: Self-pay | Admitting: Oncology

## 2020-12-26 MED FILL — Dexamethasone Sodium Phosphate Inj 100 MG/10ML: INTRAMUSCULAR | Qty: 1 | Status: AC

## 2020-12-26 MED FILL — Etoposide Inj 1 GM/50ML (20 MG/ML): INTRAVENOUS | Qty: 8 | Status: AC

## 2020-12-26 NOTE — Telephone Encounter (Signed)
I attempted call to pt to see how she tolerated infusion yesterday, no answer @ 1010.

## 2020-12-27 ENCOUNTER — Inpatient Hospital Stay: Payer: Medicare Other

## 2020-12-27 ENCOUNTER — Other Ambulatory Visit: Payer: Self-pay | Admitting: Oncology

## 2020-12-27 ENCOUNTER — Other Ambulatory Visit: Payer: Self-pay

## 2020-12-27 ENCOUNTER — Telehealth: Payer: Self-pay

## 2020-12-27 VITALS — BP 136/58 | HR 75 | Temp 98.2°F | Resp 20 | Ht 63.0 in | Wt 213.0 lb

## 2020-12-27 DIAGNOSIS — E032 Hypothyroidism due to medicaments and other exogenous substances: Secondary | ICD-10-CM

## 2020-12-27 DIAGNOSIS — E871 Hypo-osmolality and hyponatremia: Secondary | ICD-10-CM

## 2020-12-27 DIAGNOSIS — C3432 Malignant neoplasm of lower lobe, left bronchus or lung: Secondary | ICD-10-CM

## 2020-12-27 DIAGNOSIS — C3411 Malignant neoplasm of upper lobe, right bronchus or lung: Secondary | ICD-10-CM | POA: Diagnosis not present

## 2020-12-27 DIAGNOSIS — Z7901 Long term (current) use of anticoagulants: Secondary | ICD-10-CM | POA: Diagnosis not present

## 2020-12-27 DIAGNOSIS — J449 Chronic obstructive pulmonary disease, unspecified: Secondary | ICD-10-CM | POA: Diagnosis not present

## 2020-12-27 DIAGNOSIS — Z9981 Dependence on supplemental oxygen: Secondary | ICD-10-CM | POA: Diagnosis not present

## 2020-12-27 DIAGNOSIS — Z5112 Encounter for antineoplastic immunotherapy: Secondary | ICD-10-CM | POA: Diagnosis not present

## 2020-12-27 DIAGNOSIS — Z79899 Other long term (current) drug therapy: Secondary | ICD-10-CM | POA: Diagnosis not present

## 2020-12-27 DIAGNOSIS — Z5189 Encounter for other specified aftercare: Secondary | ICD-10-CM | POA: Diagnosis not present

## 2020-12-27 DIAGNOSIS — E039 Hypothyroidism, unspecified: Secondary | ICD-10-CM | POA: Diagnosis not present

## 2020-12-27 DIAGNOSIS — Z87891 Personal history of nicotine dependence: Secondary | ICD-10-CM | POA: Diagnosis not present

## 2020-12-27 DIAGNOSIS — Z23 Encounter for immunization: Secondary | ICD-10-CM | POA: Diagnosis not present

## 2020-12-27 DIAGNOSIS — E222 Syndrome of inappropriate secretion of antidiuretic hormone: Secondary | ICD-10-CM

## 2020-12-27 DIAGNOSIS — C7951 Secondary malignant neoplasm of bone: Secondary | ICD-10-CM | POA: Diagnosis not present

## 2020-12-27 DIAGNOSIS — D241 Benign neoplasm of right breast: Secondary | ICD-10-CM | POA: Diagnosis not present

## 2020-12-27 DIAGNOSIS — Z86711 Personal history of pulmonary embolism: Secondary | ICD-10-CM | POA: Diagnosis not present

## 2020-12-27 DIAGNOSIS — Z85038 Personal history of other malignant neoplasm of large intestine: Secondary | ICD-10-CM | POA: Diagnosis not present

## 2020-12-27 DIAGNOSIS — Z5111 Encounter for antineoplastic chemotherapy: Secondary | ICD-10-CM | POA: Diagnosis not present

## 2020-12-27 LAB — T4: T4, Total: 8.8 ug/dL (ref 4.5–12.0)

## 2020-12-27 MED ORDER — SODIUM CHLORIDE 0.9% FLUSH
10.0000 mL | INTRAVENOUS | Status: DC | PRN
  Administered 2020-12-27: 10 mL

## 2020-12-27 MED ORDER — LEVOTHYROXINE SODIUM 100 MCG PO TABS: 100.0000 ug | ORAL_TABLET | Freq: Every day | ORAL | 5 refills | Status: DC

## 2020-12-27 MED ORDER — SODIUM CHLORIDE 0.9 % IV SOLN
75.0000 mg/m2 | Freq: Once | INTRAVENOUS | Status: AC
  Administered 2020-12-27: 160 mg via INTRAVENOUS
  Filled 2020-12-27: qty 8

## 2020-12-27 MED ORDER — HEPARIN SOD (PORK) LOCK FLUSH 100 UNIT/ML IV SOLN
500.0000 [IU] | Freq: Once | INTRAVENOUS | Status: AC | PRN
  Administered 2020-12-27: 500 [IU]

## 2020-12-27 MED ORDER — SODIUM CHLORIDE 0.9 % IV SOLN
10.0000 mg | Freq: Once | INTRAVENOUS | Status: AC
  Administered 2020-12-27: 10 mg via INTRAVENOUS
  Filled 2020-12-27: qty 1

## 2020-12-27 MED ORDER — SODIUM CHLORIDE 0.9 % IV SOLN: Freq: Once | INTRAVENOUS | Status: AC

## 2020-12-27 MED FILL — Dexamethasone Sodium Phosphate Inj 100 MG/10ML: INTRAMUSCULAR | Qty: 1 | Status: AC

## 2020-12-27 NOTE — Patient Instructions (Signed)
Gabrielle Hudson  Discharge Instructions: Thank you for choosing [facility] to provide your oncology and hematology care.  If you have a lab appointment with the [facility], please go directly to the [facility] and check in at the registration area.   Wear comfortable clothing and clothing appropriate for easy access to any Portacath or PICC line.   We strive to give you quality time with your provider. You may need to reschedule your appointment if you arrive late (15 or more minutes).  Arriving late affects you and other patients whose appointments are after yours.  Also, if you miss three or more appointments without notifying the office, you may be dismissed from the clinic at the provider's discretion.      For prescription refill requests, have your pharmacy contact our office and allow 72 hours for refills to be completed.    Today you received the following chemotherapy and/or immunotherapy agents etoposide    To help prevent nausea and vomiting after your treatment, we encourage you to take your nausea medication as directed.  BELOW ARE SYMPTOMS THAT SHOULD BE REPORTED IMMEDIATELY: *FEVER GREATER THAN 100.4 F (38 C) OR HIGHER *CHILLS OR SWEATING *NAUSEA AND VOMITING THAT IS NOT CONTROLLED WITH YOUR NAUSEA MEDICATION *UNUSUAL SHORTNESS OF BREATH *UNUSUAL BRUISING OR BLEEDING *URINARY PROBLEMS (pain or burning when urinating, or frequent urination) *BOWEL PROBLEMS (unusual diarrhea, constipation, pain near the anus) TENDERNESS IN MOUTH AND THROAT WITH OR WITHOUT PRESENCE OF ULCERS (sore throat, sores in mouth, or a toothache) UNUSUAL RASH, SWELLING OR PAIN  UNUSUAL VAGINAL DISCHARGE OR ITCHING   Items with * indicate a potential emergency and should be followed up as soon as possible or go to the Emergency Department if any problems should occur.  Please show the CHEMOTHERAPY ALERT CARD or IMMUNOTHERAPY ALERT CARD at check-in to the  Emergency Department and triage nurse.  Should you have questions after your visit or need to cancel or reschedule your appointment, please contact [facility]  Dept: [phone]  and follow the prompts.  Office hours are 8:00 a.m. to 4:30 p.m. Monday - Friday. Please note that voicemails left after 4:00 p.m. may not be returned until the following business day.  We are closed weekends and major holidays. You have access to a nurse at all times for urgent questions. Please call the main number to the clinic Dept: [phone] and follow the prompts.  For any non-urgent questions, you may also contact your provider using MyChart. We now offer e-Visits for anyone 28 and older to request care online for non-urgent symptoms. For details visit [email or web address].   Also download the MyChart app! Go to the app store, search "MyChart", open the app, select [facility], and log in with your MyChart username and password.  Due to Covid, a mask is required upon entering the hospital/clinic. If you do not have a mask, one will be given to you upon arrival. For doctor visits, patients may have 1 support person aged 29 or older with them. For treatment visits, patients cannot have anyone with them due to current Covid guidelines and our immunocompromised population.   Etoposide, VP-16 injection What is this medication? ETOPOSIDE, VP-16 (e toe POE side) is a chemotherapy drug. It is used to treat testicular cancer, lung cancer, and other cancers. This medicine may be used for other purposes; ask your health care provider or pharmacist if you have questions. COMMON BRAND NAME(S): Etopophos, Toposar,  VePesid What should I tell my care team before I take this medication? They need to know if you have any of these conditions: infection kidney disease liver disease low blood counts, like low white cell, platelet, or red cell counts an unusual or allergic reaction to etoposide, other  medicines, foods, dyes, or preservatives pregnant or trying to get pregnant breast-feeding How should I use this medication? This medicine is for infusion into a vein. It is administered in a hospital or clinic by a specially trained health care professional. Talk to your pediatrician regarding the use of this medicine in children. Special care may be needed. Overdosage: If you think you have taken too much of this medicine contact a poison control center or emergency room at once. NOTE: This medicine is only for you. Do not share this medicine with others. What if I miss a dose? It is important not to miss your dose. Call your doctor or health care professional if you are unable to keep an appointment. What may interact with this medication? This medicine may interact with the following medications: warfarin This list may not describe all possible interactions. Give your health care provider a list of all the medicines, herbs, non-prescription drugs, or dietary supplements you use. Also tell them if you smoke, drink alcohol, or use illegal drugs. Some items may interact with your medicine. What should I watch for while using this medication? Visit your doctor for checks on your progress. This drug may make you feel generally unwell. This is not uncommon, as chemotherapy can affect healthy cells as well as cancer cells. Report any side effects. Continue your course of treatment even though you feel ill unless your doctor tells you to stop. In some cases, you may be given additional medicines to help with side effects. Follow all directions for their use. Call your doctor or health care professional for advice if you get a fever, chills or sore throat, or other symptoms of a cold or flu. Do not treat yourself. This drug decreases your body's ability to fight infections. Try to avoid being around people who are sick. This medicine may increase your risk to bruise or bleed. Call your doctor or health  care professional if you notice any unusual bleeding. Talk to your doctor about your risk of cancer. You may be more at risk for certain types of cancers if you take this medicine. Do not become pregnant while taking this medicine or for at least 6 months after stopping it. Women should inform their doctor if they wish to become pregnant or think they might be pregnant. Women of child-bearing potential will need to have a negative pregnancy test before starting this medicine. There is a potential for serious side effects to an unborn child. Talk to your health care professional or pharmacist for more information. Do not breast-feed an infant while taking this medicine. Men must use a latex condom during sexual contact with a woman while taking this medicine and for at least 4 months after stopping it. A latex condom is needed even if you have had a vasectomy. Contact your doctor right away if your partner becomes pregnant. Do not donate sperm while taking this medicine and for at least 4 months after you stop taking this medicine. Men should inform their doctors if they wish to father a child. This medicine may lower sperm counts. What side effects may I notice from receiving this medication? Side effects that you should report to your doctor or health  care professional as soon as possible: allergic reactions like skin rash, itching or hives, swelling of the face, lips, or tongue low blood counts - this medicine may decrease the number of white blood cells, red blood cells, and platelets. You may be at increased risk for infections and bleeding nausea, vomiting redness, blistering, peeling or loosening of the skin, including inside the mouth signs and symptoms of infection like fever; chills; cough; sore throat; pain or trouble passing urine signs and symptoms of low red blood cells or anemia such as unusually weak or tired; feeling faint or lightheaded; falls; breathing problems unusual bruising or  bleeding Side effects that usually do not require medical attention (report to your doctor or health care professional if they continue or are bothersome): changes in taste diarrhea hair loss loss of appetite mouth sores This list may not describe all possible side effects. Call your doctor for medical advice about side effects. You may report side effects to FDA at 1-800-FDA-1088. Where should I keep my medication? This drug is given in a hospital or clinic and will not be stored at home. NOTE: This sheet is a summary. It may not cover all possible information. If you have questions about this medicine, talk to your doctor, pharmacist, or health care provider.  2022 Elsevier/Gold Standard (2018-05-06 16:57:15)

## 2020-12-27 NOTE — Telephone Encounter (Signed)
-----   Message from Derwood Kaplan, MD sent at 12/27/2020  1:43 PM EDT ----- Regarding: call pt Tell her thyroid is sluggish, we need to increase her dose to 100 mcg daily, I will send in

## 2020-12-27 NOTE — Telephone Encounter (Signed)
Patient notified

## 2020-12-28 ENCOUNTER — Inpatient Hospital Stay: Payer: Medicare Other

## 2020-12-28 ENCOUNTER — Other Ambulatory Visit: Payer: Self-pay | Admitting: Hematology and Oncology

## 2020-12-28 MED ORDER — MECLIZINE HCL 25 MG PO TABS: 12.5000 mg | ORAL_TABLET | Freq: Three times a day (TID) | ORAL | 3 refills | Status: DC | PRN

## 2020-12-28 MED ORDER — GABAPENTIN 300 MG PO CAPS
300.0000 mg | ORAL_CAPSULE | Freq: Two times a day (BID) | ORAL | 4 refills | Status: DC
Start: 2020-12-28 — End: 2021-07-20

## 2020-12-28 NOTE — Telephone Encounter (Signed)
-----   Message from Derwood Kaplan, MD sent at 12/19/2020  2:17 PM EDT ----- Regarding: call pt Pls tell Gabrielle Hudson that I am sorry, but can't do the report for the [facility].  They require a separate appt just for that evaluation and have to have neurologic and musculoskeletal eval with strength measurements, testing her walking, etc.  I don't have time for that, no appts anyway. Let me know if she needs the paper back and I recommend she reschedule with S. Hudnell (she had to cancel last one due to being in the hospital)

## 2020-12-29 ENCOUNTER — Inpatient Hospital Stay: Payer: Medicare Other

## 2020-12-29 ENCOUNTER — Encounter: Payer: Self-pay | Admitting: Oncology

## 2020-12-29 ENCOUNTER — Other Ambulatory Visit: Payer: Self-pay

## 2020-12-29 VITALS — BP 143/67 | HR 86 | Temp 98.9°F | Resp 20 | Ht 63.0 in | Wt 213.0 lb

## 2020-12-29 DIAGNOSIS — C3432 Malignant neoplasm of lower lobe, left bronchus or lung: Secondary | ICD-10-CM

## 2020-12-29 DIAGNOSIS — E871 Hypo-osmolality and hyponatremia: Secondary | ICD-10-CM | POA: Diagnosis not present

## 2020-12-29 DIAGNOSIS — Z79899 Other long term (current) drug therapy: Secondary | ICD-10-CM | POA: Diagnosis not present

## 2020-12-29 DIAGNOSIS — Z9981 Dependence on supplemental oxygen: Secondary | ICD-10-CM | POA: Diagnosis not present

## 2020-12-29 DIAGNOSIS — D241 Benign neoplasm of right breast: Secondary | ICD-10-CM | POA: Diagnosis not present

## 2020-12-29 DIAGNOSIS — C7951 Secondary malignant neoplasm of bone: Secondary | ICD-10-CM | POA: Diagnosis not present

## 2020-12-29 DIAGNOSIS — Z85038 Personal history of other malignant neoplasm of large intestine: Secondary | ICD-10-CM | POA: Diagnosis not present

## 2020-12-29 DIAGNOSIS — Z7901 Long term (current) use of anticoagulants: Secondary | ICD-10-CM | POA: Diagnosis not present

## 2020-12-29 DIAGNOSIS — Z87891 Personal history of nicotine dependence: Secondary | ICD-10-CM | POA: Diagnosis not present

## 2020-12-29 DIAGNOSIS — Z5112 Encounter for antineoplastic immunotherapy: Secondary | ICD-10-CM | POA: Diagnosis not present

## 2020-12-29 DIAGNOSIS — E039 Hypothyroidism, unspecified: Secondary | ICD-10-CM | POA: Diagnosis not present

## 2020-12-29 DIAGNOSIS — C3411 Malignant neoplasm of upper lobe, right bronchus or lung: Secondary | ICD-10-CM | POA: Diagnosis not present

## 2020-12-29 DIAGNOSIS — Z86711 Personal history of pulmonary embolism: Secondary | ICD-10-CM | POA: Diagnosis not present

## 2020-12-29 DIAGNOSIS — Z5189 Encounter for other specified aftercare: Secondary | ICD-10-CM | POA: Diagnosis not present

## 2020-12-29 DIAGNOSIS — Z5111 Encounter for antineoplastic chemotherapy: Secondary | ICD-10-CM | POA: Diagnosis not present

## 2020-12-29 DIAGNOSIS — J449 Chronic obstructive pulmonary disease, unspecified: Secondary | ICD-10-CM | POA: Diagnosis not present

## 2020-12-29 DIAGNOSIS — E222 Syndrome of inappropriate secretion of antidiuretic hormone: Secondary | ICD-10-CM

## 2020-12-29 DIAGNOSIS — Z23 Encounter for immunization: Secondary | ICD-10-CM | POA: Diagnosis not present

## 2020-12-29 MED ORDER — HEPARIN SOD (PORK) LOCK FLUSH 100 UNIT/ML IV SOLN
500.0000 [IU] | Freq: Once | INTRAVENOUS | Status: DC | PRN
  Administered 2020-12-29: 500 [IU]

## 2020-12-29 MED ORDER — SODIUM CHLORIDE 0.9% FLUSH
10.0000 mL | INTRAVENOUS | Status: DC | PRN
  Administered 2020-12-29: 10 mL

## 2020-12-29 MED ORDER — PEGFILGRASTIM-BMEZ 6 MG/0.6ML ~~LOC~~ SOSY
6.0000 mg | PREFILLED_SYRINGE | Freq: Once | SUBCUTANEOUS | Status: AC
  Administered 2020-12-29: 6 mg via SUBCUTANEOUS
  Filled 2020-12-29: qty 0.6

## 2020-12-29 NOTE — Progress Notes (Signed)
Discharged home, stable  

## 2021-01-02 NOTE — Progress Notes (Signed)
Sent in request for DOS 01/11/2021 to [employer].

## 2021-01-05 ENCOUNTER — Encounter: Payer: Self-pay | Admitting: Oncology

## 2021-01-05 ENCOUNTER — Inpatient Hospital Stay: Payer: Medicare Other

## 2021-01-05 ENCOUNTER — Other Ambulatory Visit: Payer: Self-pay | Admitting: Hematology and Oncology

## 2021-01-05 ENCOUNTER — Inpatient Hospital Stay (INDEPENDENT_AMBULATORY_CARE_PROVIDER_SITE_OTHER): Payer: Medicare Other | Admitting: Hematology and Oncology

## 2021-01-05 DIAGNOSIS — Z87891 Personal history of nicotine dependence: Secondary | ICD-10-CM | POA: Diagnosis not present

## 2021-01-05 DIAGNOSIS — E039 Hypothyroidism, unspecified: Secondary | ICD-10-CM | POA: Diagnosis not present

## 2021-01-05 DIAGNOSIS — C7951 Secondary malignant neoplasm of bone: Secondary | ICD-10-CM | POA: Diagnosis not present

## 2021-01-05 DIAGNOSIS — C3432 Malignant neoplasm of lower lobe, left bronchus or lung: Secondary | ICD-10-CM

## 2021-01-05 DIAGNOSIS — C7952 Secondary malignant neoplasm of bone marrow: Secondary | ICD-10-CM

## 2021-01-05 DIAGNOSIS — Z86711 Personal history of pulmonary embolism: Secondary | ICD-10-CM | POA: Diagnosis not present

## 2021-01-05 DIAGNOSIS — Z5111 Encounter for antineoplastic chemotherapy: Secondary | ICD-10-CM | POA: Diagnosis not present

## 2021-01-05 DIAGNOSIS — E222 Syndrome of inappropriate secretion of antidiuretic hormone: Secondary | ICD-10-CM

## 2021-01-05 DIAGNOSIS — Z79899 Other long term (current) drug therapy: Secondary | ICD-10-CM | POA: Diagnosis not present

## 2021-01-05 DIAGNOSIS — Z5112 Encounter for antineoplastic immunotherapy: Secondary | ICD-10-CM | POA: Diagnosis not present

## 2021-01-05 DIAGNOSIS — D649 Anemia, unspecified: Secondary | ICD-10-CM | POA: Diagnosis not present

## 2021-01-05 DIAGNOSIS — Z9981 Dependence on supplemental oxygen: Secondary | ICD-10-CM | POA: Diagnosis not present

## 2021-01-05 DIAGNOSIS — Z85038 Personal history of other malignant neoplasm of large intestine: Secondary | ICD-10-CM | POA: Diagnosis not present

## 2021-01-05 DIAGNOSIS — E871 Hypo-osmolality and hyponatremia: Secondary | ICD-10-CM | POA: Diagnosis not present

## 2021-01-05 DIAGNOSIS — Z5189 Encounter for other specified aftercare: Secondary | ICD-10-CM | POA: Diagnosis not present

## 2021-01-05 DIAGNOSIS — C3411 Malignant neoplasm of upper lobe, right bronchus or lung: Secondary | ICD-10-CM | POA: Diagnosis not present

## 2021-01-05 DIAGNOSIS — Z23 Encounter for immunization: Secondary | ICD-10-CM | POA: Diagnosis not present

## 2021-01-05 DIAGNOSIS — Z7901 Long term (current) use of anticoagulants: Secondary | ICD-10-CM | POA: Diagnosis not present

## 2021-01-05 DIAGNOSIS — J449 Chronic obstructive pulmonary disease, unspecified: Secondary | ICD-10-CM | POA: Diagnosis not present

## 2021-01-05 DIAGNOSIS — D241 Benign neoplasm of right breast: Secondary | ICD-10-CM | POA: Diagnosis not present

## 2021-01-05 LAB — CBC AND DIFFERENTIAL
HCT: 32 — AB (ref 36–46)
Hemoglobin: 10.6 — AB (ref 12.0–16.0)
Neutrophils Absolute: 1.57
Platelets: 101 — AB (ref 150–399)
WBC: 2.7

## 2021-01-05 LAB — TSH: TSH: 6.802 u[IU]/mL — ABNORMAL HIGH (ref 0.350–4.500)

## 2021-01-05 LAB — MAGNESIUM: Magnesium: 1.6

## 2021-01-05 LAB — HEPATIC FUNCTION PANEL
ALT: 13 (ref 7–35)
AST: 19 (ref 13–35)
Alkaline Phosphatase: 97 (ref 25–125)
Bilirubin, Total: 0.4

## 2021-01-05 LAB — CBC
MCV: 88 (ref 81–99)
RBC: 3.65 — AB (ref 3.87–5.11)

## 2021-01-05 LAB — BASIC METABOLIC PANEL
BUN: 19 (ref 4–21)
CO2: 27 — AB (ref 13–22)
Chloride: 95 — AB (ref 99–108)
Creatinine: 0.9 (ref 0.5–1.1)
Glucose: 133
Potassium: 4.2 (ref 3.4–5.3)
Sodium: 132 — AB (ref 137–147)

## 2021-01-05 LAB — COMPREHENSIVE METABOLIC PANEL
Albumin: 4.3 (ref 3.5–5.0)
Calcium: 9 (ref 8.7–10.7)

## 2021-01-05 NOTE — Assessment & Plan Note (Signed)
Her magnesium was mildly low in August, but is normal today.

## 2021-01-05 NOTE — Assessment & Plan Note (Addendum)
Post thyroidectomy hypothyroidism. Her last TSH increased significantly at her last visit. The worsening hypothyroidism was felt to possibly be due to immunotherapy. Dr. Hinton Rao increased her levothyroxine to 100 mcg daily on October 5th. She states she is taking this dose before breakfast without food or other medications.

## 2021-01-05 NOTE — Assessment & Plan Note (Signed)
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 She was placed on zoledronic acid in March 2022.  She developed severe hypocalcemia with her 1st dose and we have not resumed this yet.

## 2021-01-05 NOTE — Assessment & Plan Note (Addendum)
Her sodium is stable on salt tablets twice daily.

## 2021-01-05 NOTE — Progress Notes (Signed)
Gabrielle Hudson  [address] [place],  [place]  [address] [phone]  Clinic Day:  01/08/2021  Referring physician: Helen Hashimoto., MD  ASSESSMENT & PLAN:   Assessment & Plan: Small cell carcinoma of lower lobe of left lung ([facility]) Small cell carcinoma of the lung, at least a clinical stage IIIB, diagnosed in October 2021.   She was initially treated with chemotherapy with carboplatin/etoposide with a good response.  She was then switched to nivolumab. However, she was found to have recent progression on disease in August 2022.  She underwent palliative radiation therapy, which she completed in August. She is now receiving palliative chemotherapy/immunotherapy consisting of carboplatin/etoposide/atezolizumab and had her 1st cycle on beginning October 3rd. She tolerated her 1st cycle without significant difficulty. She has mild neutropenia. We will plan to see her back on October 20th with a CBC, comprehensive metabolic panel, TSH, T4 and CEA prior to a 2nd cycle.  SIADH (syndrome of inappropriate ADH production) (HCC)  Her sodium is stable on salt tablets twice daily.  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 She was placed on zoledronic acid in March 2022.  She developed severe hypocalcemia with her 1st dose and we have not resumed this yet.  Hypothyroidism Post thyroidectomy hypothyroidism. Her last TSH increased significantly at her last visit. The worsening hypothyroidism was felt to possibly be due to immunotherapy. Dr. Hinton Rao increased her levothyroxine to 100 mcg daily on October 5th. She states she is taking this dose before breakfast without food or other medications.   Hypomagnesemia  Her magnesium was mildly low in August, but is normal today.   The patient understands the plans  discussed today and is in agreement with them.  She knows to contact our office if she develops concerns prior to her next appointment.    [name], PA-C  [facility] AT [facility] [address] [place] [address] Dept: [phone] Dept Fax: [phone]   Orders Placed This Encounter  Procedures   CBC and differential    This external order was created through the Results Console.   CBC    This external order was created through the Results Console.   Basic metabolic panel    This external order was created through the Results Console.   Comprehensive metabolic panel    This external order was created through the Results Console.   Hepatic function panel    This external order was created through the Results Console.   CBC    This order was created through External Result Entry   Magnesium    This order was created through External Result Entry       CHIEF COMPLAINT:  CC:   Small cell carcinoma with bone metastasis  Current Treatment:   Palliative  carboplatin/etoposide/atezolizumab   HISTORY OF PRESENT ILLNESS:   Oncology History  Small cell carcinoma of lower lobe of left lung (Gabrielle Hudson)  01/20/2020 Initial Diagnosis   Lung cancer, lower lobe (Gabrielle Hudson)   01/20/2020 Cancer Staging   Staging form: Lung, AJCC 8th Edition - Clinical: Stage IIIB (cT3, cN2, cM0) - Signed by Gabrielle Kaplan, MD on 01/20/2020   01/21/2020 Cancer Staging   Staging form: Lung, AJCC 8th Edition - Pathologic stage from 01/21/2020: Stage IIIB (pT3, pN2, cM0) -  Signed by Gabrielle Kaplan, MD on 01/21/2020   01/31/2020 - 04/28/2020 Chemotherapy          06/08/2020 - 10/13/2020 Chemotherapy          12/25/2020 -  Chemotherapy   Patient is on Treatment Plan : LUNG SCLC Carboplatin + Etoposide + Atezolizumab Induction q21d / Atezolizumab Maintenance q21d     Secondary malignant neoplasm of bone and bone marrow (Gabrielle Hudson)      INTERVAL HISTORY:  Gabrielle Hudson is here today for repeat clinical assessment to see how she tolerated her 1st cycle of carboplatin/etoposide/atezolizumab.  She denies significant difficulty with treatment. She denies fevers or chills. She reports occasional mild rib pain. Her appetite is fairly good. Her weight has increased 5 pounds over last week . She denies swelling. She does not think she is taking furosemide any longer. She is unsure of her medications, so I gave her a list to bring home and compare to what she is currently taking and asked her to bring that to her next appointment. She continues to use a motorized wheelchair due to her COPD.  REVIEW OF SYSTEMS:  Review of Systems  Constitutional:  Negative for appetite change, chills, fatigue, fever and unexpected weight change.  HENT:   Negative for lump/mass, mouth sores and sore throat.   Respiratory:  Negative for cough, hemoptysis and shortness of breath.   Cardiovascular:  Negative for chest pain and leg swelling.  Gastrointestinal:  Negative for abdominal pain, constipation, diarrhea, nausea and vomiting.  Genitourinary:  Negative for difficulty urinating, dysuria, frequency and hematuria.   Musculoskeletal:  Negative for arthralgias, back pain and myalgias.  Skin:  Negative for rash.  Neurological:  Negative for dizziness and headaches.  Hematological:  Negative for adenopathy. Does not bruise/bleed easily.  Psychiatric/Behavioral:  Negative for depression and sleep disturbance. The patient is not nervous/anxious.     VITALS:  Blood pressure (!) 175/78, pulse (!) 108, temperature 98.9 F (37.2 C), temperature source Oral, resp. rate 20, height 5\' 3"  (1.6 m), weight 218 lb 4.8 oz (99 kg), SpO2 96 %.  Wt Readings from Last 3 Encounters:  01/05/21 218 lb 4.8 oz (99 kg)  12/29/20 213 lb (96.6 kg)  12/27/20 213 lb (96.6 kg)    Body mass index is 38.67 kg/m.  Performance status (ECOG): 2 - Symptomatic, <50% confined to  bed  PHYSICAL EXAM:  Physical Exam Vitals and nursing note reviewed.  Constitutional:      General: She is not in acute distress.    Appearance: Normal appearance.  HENT:     Head: Normocephalic and atraumatic.     Mouth/Throat:     Mouth: Mucous membranes are moist.     Pharynx: Oropharynx is clear. No oropharyngeal exudate or posterior oropharyngeal erythema.  Eyes:     General: No scleral icterus.    Extraocular Movements: Extraocular movements intact.     Conjunctiva/sclera: Conjunctivae normal.     Pupils: Pupils are equal, round, and reactive to light.  Cardiovascular:     Rate and Rhythm: Normal rate and regular rhythm.     Heart sounds: Normal heart sounds. No murmur heard.   No friction rub. No gallop.  Pulmonary:     Effort: Pulmonary effort is normal.     Breath sounds: Examination of the left-lower field reveals decreased breath sounds. Decreased breath sounds (stable) present. No wheezing, rhonchi or rales.     Comments: No rib tenderness Abdominal:     General: There is no  distension.     Palpations: Abdomen is soft. There is no hepatomegaly, splenomegaly or mass.     Tenderness: There is no abdominal tenderness.  Musculoskeletal:        General: Normal range of motion.     Cervical back: Normal range of motion and neck supple. No tenderness.     Right lower leg: No edema.     Left lower leg: No edema.  Lymphadenopathy:     Cervical: No cervical adenopathy.     Upper Body:     Right upper body: No supraclavicular or axillary adenopathy.     Left upper body: No supraclavicular or axillary adenopathy.     Lower Body: No right inguinal adenopathy. No left inguinal adenopathy.  Skin:    General: Skin is warm and dry.     Coloration: Skin is not jaundiced.     Findings: No rash.  Neurological:     Mental Status: She is alert and oriented to person, place, and time.     Cranial Nerves: No cranial nerve deficit.  Psychiatric:        Mood and Affect: Mood  normal.        Behavior: Behavior normal.        Thought Content: Thought content normal.    LABS:   CBC Latest Ref Rng & Units 01/05/2021 12/25/2020 12/14/2020  WBC - 2.7 4.4 6.3  Hemoglobin 12.0 - 16.0 10.6(A) 10.7(A) 11.1(A)  Hematocrit 36 - 46 32(A) 32(A) 34(A)  Platelets 150 - 399 101(A) 239 208   CMP Latest Ref Rng & Units 01/05/2021 12/25/2020 12/14/2020  Glucose 70 - 99 mg/dL - - -  BUN 4 - 21 19 12 16   Creatinine 0.5 - 1.1 0.9 0.8 1.0  Sodium 137 - 147 132(A) 119(A) 135(A)  Potassium 3.4 - 5.3 4.2 4.1 4.3  Chloride 99 - 108 95(A) 84(A) 102  CO2 13 - 22 27(A) 27(A) 24(A)  Calcium 8.7 - 10.7 9.0 8.9 8.6(A)  Total Protein 6.5 - 8.1 g/dL - - -  Total Bilirubin 0.3 - 1.2 mg/dL - - -  Alkaline Phos 25 - 125 97 95 108  AST 13 - 35 19 23 34  ALT 7 - 35 13 12 25      Lab Results  Component Value Date   CEA1 8.0 (H) 12/14/2020   /  CEA  Date Value Ref Range Status  12/14/2020 8.0 (H) 0.0 - 4.7 ng/mL Final    Comment:    (NOTE)                             Nonsmokers          <3.9                             Smokers             <5.6 Roche [employer] Electrochemiluminescence Immunoassay (ECLIA) Values obtained with different assay methods or kits cannot be used interchangeably.  Results cannot be interpreted as absolute evidence of the presence or absence of malignant disease. Performed At: [facility] [place], [place] [id number] Rush Farmer MD [id number]    No results found for: PSA1 No results found for: CAN199 No results found for: DGL875  Lab Results  Component Value Date   TOTALPROTELP 4.7 (L) 03/26/2011   ALBUMINELP 52.5 (L) 03/26/2011   A1GS 8.7 (H)  03/26/2011   A2GS 18.8 (H) 03/26/2011   BETS 4.5 (L) 03/26/2011   BETA2SER 4.7 03/26/2011   GAMS 10.8 (L) 03/26/2011   MSPIKE NOT DETECTED 03/26/2011   SPEI (NOTE) 03/26/2011   Lab Results  Component Value Date   TIBC 226 04/19/2020   TIBC 219 (L) 03/29/2011   FERRITIN 361  04/19/2020   FERRITIN 356 (H) 03/29/2011   IRONPCTSAT 44.2 04/19/2020   IRONPCTSAT 65 (H) 03/29/2011   Lab Results  Component Value Date   LDH 349 (H) 03/26/2011    STUDIES:  No results found.    HISTORY:   Past Medical History:  Diagnosis Date   Anemia, unspecified    Anemia, unspecified    Cancer ([place]) 01/2009   SCC of the lung   COPD (chronic obstructive pulmonary disease) (HCC) 2-3 LPM   GERD (gastroesophageal reflux disease)    Hyposmolality syndrome    Hyposmolality syndrome    Hypothyroidism    Malignant neoplasm of overlapping sites of right lung (HCC)    Malignant neoplasm of overlapping sites of right lung (HCC)    Malignant neoplasm of sigmoid colon (HCC)    Malignant neoplasm of sigmoid colon (HCC)    Shortness of breath    Sleep apnea     Past Surgical History:  Procedure Laterality Date   COLON SURGERY     PORTACATH PLACEMENT  2010    Family History  Problem Relation Age of Onset   Colon cancer Father    Prostate cancer Father     Social History:  reports that she quit smoking about 12 years ago. Her smoking use included cigarettes. She has never used smokeless tobacco. She reports that she does not drink alcohol and does not use drugs.The patient is alone today.  Allergies:  Allergies  Allergen Reactions   Nitroglycerin In D5w Other (See Comments)    "flatlines"   Nitroglycerin Other (See Comments)    Drops her BP 'flatlines'     Current Medications: Current Outpatient Medications  Medication Sig Dispense Refill   albuterol (PROVENTIL) (5 MG/ML) 0.5% nebulizer solution Take 2.5 mg by nebulization daily.     albuterol (VENTOLIN HFA) 108 (90 Base) MCG/ACT inhaler      budesonide-formoterol (SYMBICORT) 160-4.5 MCG/ACT inhaler Inhale 2 puffs into the lungs 2 (two) times daily.       calcium citrate-vitamin D (CITRACAL+D) 315-200 MG-UNIT tablet Take 1 tablet by mouth 2 (two) times daily.     Cholecalciferol (VITAMIN D3) 1.25 MG (50000 UT)  CAPS      cyclobenzaprine (FLEXERIL) 10 MG tablet Take 1 tablet (10 mg total) by mouth 3 (three) times daily as needed for muscle spasms. 30 tablet 3   diltiazem (CARDIZEM CD) 120 MG 24 hr capsule Take 120 mg by mouth daily.     ELIQUIS 5 MG TABS tablet Take 1 tablet by mouth twice daily 60 tablet 2   famotidine (PEPCID) 20 MG tablet Take 20 mg by mouth 2 (two) times daily.     fluticasone (FLONASE) 50 MCG/ACT nasal spray Place into both nostrils.     furosemide (LASIX) 20 MG tablet Take 20 mg by mouth as needed. ONLY TAKES IT HAVING SWELLING     gabapentin (NEURONTIN) 300 MG capsule Take 1 capsule (300 mg total) by mouth 2 (two) times daily. 60 capsule 4   guaiFENesin (MUCINEX) 600 MG 12 hr tablet Take 1 tablet by mouth every 12 (twelve) hours. (Patient not taking: Reported on 10/10/2020)  levothyroxine (SYNTHROID) 100 MCG tablet Take 1 tablet (100 mcg total) by mouth daily before breakfast. 30 tablet 5   meclizine (ANTIVERT) 25 MG tablet Take 0.5-1 tablets (12.5-25 mg total) by mouth 3 (three) times daily as needed for dizziness. 120 tablet 3   ondansetron (ZOFRAN) 4 MG tablet Take 1 tablet (4 mg total) by mouth every 4 (four) hours as needed for nausea. 90 tablet 3   OXYGEN Inhale 3 L into the lungs continuous.     Potassium Chloride ER 20 MEQ TBCR Take 1 tablet by mouth 2 (two) times daily.     prochlorperazine (COMPAZINE) 10 MG tablet Take 1 tablet (10 mg total) by mouth every 6 (six) hours as needed for nausea or vomiting. 90 tablet 3   rOPINIRole (REQUIP) 0.5 MG tablet Take 0.5 mg by mouth at bedtime.     sodium chloride 1 g tablet Take 1 g by mouth 2 (two) times daily.     Vitamin D, Ergocalciferol, (DRISDOL) 1.25 MG (50000 UNIT) CAPS capsule Take 50,000 Units by mouth once a week.     No current facility-administered medications for this visit.

## 2021-01-05 NOTE — Assessment & Plan Note (Addendum)
Small cell carcinoma of the lung, at least a clinical stage IIIB, diagnosed in October 2021.   She was initially treated with chemotherapy with carboplatin/etoposide with a good response. She was then switched to nivolumab. However, she was found to have recent progression on disease in August 2022.  She underwent palliative radiation therapy, which she completed in August. She is now receiving palliative chemotherapy/immunotherapy consisting of carboplatin/etoposide/atezolizumab and had her 1st cycle on beginning October 3rd. She tolerated her 1st cycle without significant difficulty. She has mild neutropenia.We will plan to see her back on October 20th with a CBC, comprehensive metabolic panel, TSH, T4 and CEA prior to a 2nd cycle.

## 2021-01-07 LAB — T4: T4, Total: 8.5 ug/dL (ref 4.5–12.0)

## 2021-01-08 ENCOUNTER — Encounter: Payer: Self-pay | Admitting: Hematology and Oncology

## 2021-01-08 ENCOUNTER — Encounter: Payer: Self-pay | Admitting: Oncology

## 2021-01-09 NOTE — Progress Notes (Signed)
Sent in request for DOS 10/24, 10/25, 10/26, and 10/28 to [employer].

## 2021-01-10 NOTE — Progress Notes (Signed)
[facility]  [address] [place],  [place]  [address] [phone]  Clinic Day:  01/11/2021  Referring physician: Jeanie Sewer, NP  This document serves as a record of services personally performed by Hosie Poisson, MD. It was created on their behalf by Curry,Lauren E, a trained medical scribe. The creation of this record is based on the scribe's personal observations and the provider's statements to them.  ASSESSMENT & PLAN:   Assessment & Plan: Small cell carcinoma of lower lobe of left lung (HCC) Small cell carcinoma of the lung, at least a clinical stage IIIB, diagnosed in October 2021.   She was initially treated with chemotherapy with carboplatin/etoposide with a good response.  She was then switched to nivolumab. However, she was found to have recent progression on disease in August 2022.  She underwent palliative radiation therapy, which she completed in August. She is now receiving palliative chemotherapy consisting of carboplatin/etoposide/atezolizumab.  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 She was placed on zoledronic acid in March 2022.  She developed severe hypocalcemia with her 1st dose and we have not resumed this yet.  Secondary malignant neoplasm of the liver She has now resumed palliative chemotherapy/immunotherapy consisting of carboplatin/etoposide/atezolizumab and had her 1st cycle on beginning October 3rd.   Non small cell lung cancer, right, 2010 Treated with chemoradiation  SIADH (syndrome of inappropriate ADH production) (HCC) Her sodium is normal on salt tablets twice daily. I advised that she try decreasing this to once daily.  Hypothyroidism Post thyroidectomy hypothyroidism, currently on levothyroxine 100 mcg daily. She states she is taking this  dose before breakfast without food or other medications.   Hypomagnesemia Her magnesium was mildly low in August, but has now normalized.   Neutropenia, mild Secondary to chemotherapy, we will continue to monitor. This has normalized today.  Hypocalcemia I advised that she increase oral calcium supplement to three times daily.  Pain of the right flank/mid thoracic posterior ribcage She states that this pain has been constant for the past month, and she uses hydrocodone if needed. I will send in a new prescription for hydrocodone 5/325, #30, Q4H prn. When she is at infusion next week, we will obtain urinalysis and culture to rule out infection. I am unsure of the etiology.  She will proceed with her 2nd cycle of carboplatin/etoposide/atezolizumab next week. At that time, we will obtain a urine specimen for urinalysis and culture for further evaluation of her right flank pain. I will also send in hydrocodone 5/325 Q4H prn. In view of her hypocalcemia, I advised that she increase oral supplement to TID. She knows to continue oral potassium supplement 20 meq BID, and I will refill this today, and she will decrease her salt tablet to once daily. We will plan to see her back in 3 weeks with a CBC, comprehensive metabolic panel, TSH, T4 and CEA prior to a 3rd cycle. She understands and agrees with this plan of care.  I provided 20 minutes of face-to-face time during this this encounter and > 50% was spent counseling as documented under my assessment and plan.    [facility] [address] [place] [address] Dept: [phone] Dept Fax: [phone]   No orders of the defined types were placed in this encounter.  CHIEF COMPLAINT:  CC:   Small cell carcinoma with bone metastasis  Current Treatment:   Palliative  carboplatin/etoposide/atezolizumab   HISTORY OF PRESENT ILLNESS:   Oncology History  Small cell carcinoma of  lower lobe of left lung ([facility])  01/20/2020 Initial Diagnosis   Lung cancer, lower lobe ([facility])   01/20/2020 Cancer Staging   Staging form: Lung, AJCC 8th Edition - Clinical: Stage IIIB (cT3, cN2, cM0) - Signed by Derwood Kaplan, MD on 01/20/2020   01/21/2020 Cancer Staging   Staging form: Lung, AJCC 8th Edition - Pathologic stage from 01/21/2020: Stage IIIB (pT3, pN2, cM0) - Signed by Derwood Kaplan, MD on 01/21/2020   01/31/2020 - 04/28/2020 Chemotherapy          06/08/2020 - 10/13/2020 Chemotherapy          12/22/2020 Imaging   CT CHEST, ABDOMEN AND PELVIS WITH CONTRAST: 1. Interval mixed response to therapy. 2. There has been interval resolution of left hilar adenopathy. Masslike architectural distortion within the superior segment of right lower lobe is slightly decreased in size in the interval. 3. Interval enlargement of subcarinal lymph node concerning for metastatic adenopathy. 4. There are several new, low-attenuation lesions within both lobes of liver. Worrisome for metastatic disease. 5. Stable appearance of multifocal sclerotic bone metastases. 6. Aortic Atherosclerosis (ICD10-I70.0) and Emphysema (ICD10-J43.9).   12/25/2020 -  Chemotherapy   Patient is on Treatment Plan : LUNG SCLC Carboplatin + Etoposide + Atezolizumab Induction q21d / Atezolizumab Maintenance q21d     Secondary malignant neoplasm of bone and bone marrow (HCC)     INTERVAL HISTORY:  Kamela is here for follow up prior to a 2nd cycle of carboplatin/etoposide/atezolizumab. She resumed this last month, and tolerated without difficulty. She remains stable, but does report constant mid thoracic right posterior rib/flank pain for the past month. She has been using hydrocodone if needed. We will obtain a urinalysis and culture next week at the infusion center for further evaluation of her pain. Hemoglobin has decreased from 10.6 to 9.9, and white count and platelets have normalized. Chemistries  are unremarkable except for a calcium of 8.2, down from 9.0, and mildly elevated liver transaminases. Sodium is normal at 139 today, so she will decrease her salt tablets to one daily. She continues oral calcium BID, and I advised that she increase this to TID. Her  appetite is good, and she has gained 3 pounds since her last visit.  She denies fever, chills or other signs of infection.  She denies nausea, vomiting, bowel issues, or abdominal pain.  She denies sore throat, cough, dyspnea, or chest pain.  REVIEW OF SYSTEMS:  Review of Systems  Constitutional: Negative.  Negative for appetite change, chills, fatigue, fever and unexpected weight change.  HENT:  Negative.    Eyes: Negative.   Respiratory:  Positive for shortness of breath (chronic, on supplemental oxygen). Negative for chest tightness, cough, hemoptysis and wheezing.   Cardiovascular: Negative.  Negative for chest pain, leg swelling and palpitations.  Gastrointestinal: Negative.  Negative for abdominal distention, abdominal pain, blood in stool, constipation, diarrhea, nausea and vomiting.  Endocrine: Negative.   Genitourinary:  Negative for difficulty urinating, dysuria, frequency and hematuria.   Musculoskeletal:  Positive for flank pain (Mid thoracic right posterior rib pain) and gait problem (uses a motorized wheelchair). Negative for arthralgias, back pain and myalgias.  Skin: Negative.   Neurological:  Positive for gait problem (uses a motorized wheelchair). Negative for dizziness, extremity weakness, headaches, light-headedness, numbness, seizures  and speech difficulty.  Hematological: Negative.   Psychiatric/Behavioral: Negative.  Negative for depression and sleep disturbance. The patient is not nervous/anxious.     VITALS:  Blood pressure (!) 162/76, pulse 100, temperature 98.7 F (37.1 C), temperature source Oral, resp. rate 18, height 5\' 3"  (1.6 m), weight 221 lb 6.4 oz (100.4 kg), SpO2 98 %.  Wt Readings from Last 3  Encounters:  01/11/21 221 lb 6.4 oz (100.4 kg)  01/05/21 218 lb 4.8 oz (99 kg)  12/29/20 213 lb (96.6 kg)    Body mass index is 39.22 kg/m.  Performance status (ECOG): 2 - Symptomatic, <50% confined to bed  PHYSICAL EXAM:  Physical Exam Constitutional:      General: She is not in acute distress.    Appearance: Normal appearance. She is normal weight.  HENT:     Head: Normocephalic and atraumatic.  Eyes:     General: No scleral icterus.    Extraocular Movements: Extraocular movements intact.     Conjunctiva/sclera: Conjunctivae normal.     Pupils: Pupils are equal, round, and reactive to light.  Cardiovascular:     Rate and Rhythm: Normal rate and regular rhythm.     Pulses: Normal pulses.     Heart sounds: Normal heart sounds. No murmur heard.   No friction rub. No gallop.  Pulmonary:     Effort: Pulmonary effort is normal. No respiratory distress.     Breath sounds: Normal breath sounds.  Abdominal:     General: Bowel sounds are normal. There is no distension.     Palpations: Abdomen is soft. There is no hepatomegaly, splenomegaly or mass.     Tenderness: There is no abdominal tenderness.  Musculoskeletal:        General: Normal range of motion.     Cervical back: Normal range of motion and neck supple.     Right lower leg: No edema.     Left lower leg: No edema.  Lymphadenopathy:     Cervical: No cervical adenopathy.  Skin:    General: Skin is warm and dry.  Neurological:     General: No focal deficit present.     Mental Status: She is alert and oriented to person, place, and time. Mental status is at baseline.  Psychiatric:        Mood and Affect: Mood normal.        Behavior: Behavior normal.        Thought Content: Thought content normal.        Judgment: Judgment normal.    LABS:   CBC Latest Ref Rng & Units 01/11/2021 01/05/2021 12/25/2020  WBC - 4.1 2.7 4.4  Hemoglobin 12.0 - 16.0 9.9(A) 10.6(A) 10.7(A)  Hematocrit 36 - 46 30(A) 32(A) 32(A)  Platelets  150 - 399 133(A) 101(A) 239   CMP Latest Ref Rng & Units 01/11/2021 01/05/2021 12/25/2020  Glucose 70 - 99 mg/dL - - -  BUN 4 - 21 17 19 12   Creatinine 0.5 - 1.1 0.9 0.9 0.8  Sodium 137 - 147 139 132(A) 119(A)  Potassium 3.4 - 5.3 3.8 4.2 4.1  Chloride 99 - 108 101 95(A) 84(A)  CO2 13 - 22 29(A) 27(A) 27(A)  Calcium 8.7 - 10.7 8.2(A) 9.0 8.9  Total Protein 6.5 - 8.1 g/dL - - -  Total Bilirubin 0.3 - 1.2 mg/dL - - -  Alkaline Phos 25 - 125 102 97 95  AST 13 - 35 41(A) 19 23  ALT 7 - 35 36(A) 13  12     Lab Results  Component Value Date   CEA1 8.0 (H) 12/14/2020   /  CEA  Date Value Ref Range Status  12/14/2020 8.0 (H) 0.0 - 4.7 ng/mL Final    Comment:    (NOTE)                             Nonsmokers          <3.9                             Smokers             <5.6 [employer] Electrochemiluminescence Immunoassay (ECLIA) Values obtained with different assay methods or kits cannot be used interchangeably.  Results cannot be interpreted as absolute evidence of the presence or absence of malignant disease. Performed At: [facility] [place], [place] [id number] Rush Farmer MD RK:[id number]     Lab Results  Component Value Date   TOTALPROTELP 4.7 (L) 03/26/2011   ALBUMINELP 52.5 (L) 03/26/2011   A1GS 8.7 (H) 03/26/2011   A2GS 18.8 (H) 03/26/2011   BETS 4.5 (L) 03/26/2011   BETA2SER 4.7 03/26/2011   GAMS 10.8 (L) 03/26/2011   MSPIKE NOT DETECTED 03/26/2011   SPEI (NOTE) 03/26/2011   Lab Results  Component Value Date   TIBC 226 04/19/2020   TIBC 219 (L) 03/29/2011   FERRITIN 361 04/19/2020   FERRITIN 356 (H) 03/29/2011   IRONPCTSAT 44.2 04/19/2020   IRONPCTSAT 65 (H) 03/29/2011   Lab Results  Component Value Date   LDH 349 (H) 03/26/2011    STUDIES:  No results found.    HISTORY:   Allergies:  Allergies  Allergen Reactions   Nitroglycerin In D5w Other (See Comments)    "flatlines"   Nitroglycerin Other (See Comments)     Drops her BP 'flatlines'     Current Medications: Current Outpatient Medications  Medication Sig Dispense Refill   albuterol (PROVENTIL) (5 MG/ML) 0.5% nebulizer solution Take 2.5 mg by nebulization every 6 (six) hours as needed for wheezing or shortness of breath.     albuterol (VENTOLIN HFA) 108 (90 Base) MCG/ACT inhaler Inhale into the lungs every 6 (six) hours as needed for wheezing or shortness of breath.     budesonide-formoterol (SYMBICORT) 160-4.5 MCG/ACT inhaler Inhale 2 puffs into the lungs 2 (two) times daily.       calcium citrate-vitamin D (CITRACAL+D) 315-200 MG-UNIT tablet Take 1 tablet by mouth 2 (two) times daily.     Cholecalciferol (VITAMIN D3) 1.25 MG (50000 UT) CAPS      cyclobenzaprine (FLEXERIL) 10 MG tablet Take 1 tablet (10 mg total) by mouth 3 (three) times daily as needed for muscle spasms. 30 tablet 3   diltiazem (CARDIZEM CD) 120 MG 24 hr capsule Take 120 mg by mouth daily.     ELIQUIS 5 MG TABS tablet Take 1 tablet by mouth twice daily 60 tablet 2   famotidine (PEPCID) 20 MG tablet Take 20 mg by mouth 2 (two) times daily.     furosemide (LASIX) 20 MG tablet Take 20 mg by mouth as needed. ONLY TAKES IT HAVING SWELLING, can take 2 tablets it more than a 3 lb weight gain ever night     gabapentin (NEURONTIN) 300 MG capsule Take 1 capsule (300 mg total) by mouth 2 (two) times daily. 60 capsule 4  levothyroxine (SYNTHROID) 100 MCG tablet Take 1 tablet (100 mcg total) by mouth daily before breakfast. 30 tablet 5   loratadine (CLARITIN) 10 MG tablet Take 10 mg by mouth daily.     meclizine (ANTIVERT) 25 MG tablet Take 0.5-1 tablets (12.5-25 mg total) by mouth 3 (three) times daily as needed for dizziness. 120 tablet 3   OXYGEN Inhale 3 L into the lungs continuous.     Potassium Chloride ER 20 MEQ TBCR Take 1 tablet by mouth 2 (two) times daily.     rOPINIRole (REQUIP) 0.5 MG tablet Take 0.5 mg by mouth at bedtime.     sodium chloride 1 g tablet Take 1 g by mouth 2  (two) times daily.     No current facility-administered medications for this visit.     I, Rita Ohara, am acting as scribe for Derwood Kaplan, MD  I have reviewed this report as typed by the medical scribe, and it is complete and accurate.

## 2021-01-11 ENCOUNTER — Telehealth: Payer: Self-pay | Admitting: Oncology

## 2021-01-11 ENCOUNTER — Other Ambulatory Visit: Payer: Self-pay | Admitting: Oncology

## 2021-01-11 ENCOUNTER — Encounter: Payer: Self-pay | Admitting: Oncology

## 2021-01-11 ENCOUNTER — Inpatient Hospital Stay: Payer: Medicare Other

## 2021-01-11 ENCOUNTER — Other Ambulatory Visit: Payer: Self-pay | Admitting: Hematology and Oncology

## 2021-01-11 ENCOUNTER — Inpatient Hospital Stay (INDEPENDENT_AMBULATORY_CARE_PROVIDER_SITE_OTHER): Payer: Medicare Other | Admitting: Oncology

## 2021-01-11 VITALS — BP 162/76 | HR 100 | Temp 98.7°F | Resp 18 | Ht 63.0 in | Wt 221.4 lb

## 2021-01-11 DIAGNOSIS — Z87891 Personal history of nicotine dependence: Secondary | ICD-10-CM | POA: Diagnosis not present

## 2021-01-11 DIAGNOSIS — C3411 Malignant neoplasm of upper lobe, right bronchus or lung: Secondary | ICD-10-CM | POA: Diagnosis not present

## 2021-01-11 DIAGNOSIS — C3490 Malignant neoplasm of unspecified part of unspecified bronchus or lung: Secondary | ICD-10-CM

## 2021-01-11 DIAGNOSIS — Z9981 Dependence on supplemental oxygen: Secondary | ICD-10-CM | POA: Diagnosis not present

## 2021-01-11 DIAGNOSIS — C3432 Malignant neoplasm of lower lobe, left bronchus or lung: Secondary | ICD-10-CM

## 2021-01-11 DIAGNOSIS — E871 Hypo-osmolality and hyponatremia: Secondary | ICD-10-CM | POA: Diagnosis not present

## 2021-01-11 DIAGNOSIS — Z5112 Encounter for antineoplastic immunotherapy: Secondary | ICD-10-CM | POA: Diagnosis not present

## 2021-01-11 DIAGNOSIS — Z85038 Personal history of other malignant neoplasm of large intestine: Secondary | ICD-10-CM | POA: Diagnosis not present

## 2021-01-11 DIAGNOSIS — E876 Hypokalemia: Secondary | ICD-10-CM

## 2021-01-11 DIAGNOSIS — J449 Chronic obstructive pulmonary disease, unspecified: Secondary | ICD-10-CM | POA: Diagnosis not present

## 2021-01-11 DIAGNOSIS — E032 Hypothyroidism due to medicaments and other exogenous substances: Secondary | ICD-10-CM

## 2021-01-11 DIAGNOSIS — Z86711 Personal history of pulmonary embolism: Secondary | ICD-10-CM | POA: Diagnosis not present

## 2021-01-11 DIAGNOSIS — Z23 Encounter for immunization: Secondary | ICD-10-CM | POA: Diagnosis not present

## 2021-01-11 DIAGNOSIS — C7952 Secondary malignant neoplasm of bone marrow: Secondary | ICD-10-CM

## 2021-01-11 DIAGNOSIS — Z5189 Encounter for other specified aftercare: Secondary | ICD-10-CM | POA: Diagnosis not present

## 2021-01-11 DIAGNOSIS — C7951 Secondary malignant neoplasm of bone: Secondary | ICD-10-CM

## 2021-01-11 DIAGNOSIS — Z79899 Other long term (current) drug therapy: Secondary | ICD-10-CM | POA: Diagnosis not present

## 2021-01-11 DIAGNOSIS — Z5111 Encounter for antineoplastic chemotherapy: Secondary | ICD-10-CM | POA: Diagnosis not present

## 2021-01-11 DIAGNOSIS — R109 Unspecified abdominal pain: Secondary | ICD-10-CM

## 2021-01-11 DIAGNOSIS — E039 Hypothyroidism, unspecified: Secondary | ICD-10-CM | POA: Diagnosis not present

## 2021-01-11 DIAGNOSIS — D241 Benign neoplasm of right breast: Secondary | ICD-10-CM | POA: Diagnosis not present

## 2021-01-11 DIAGNOSIS — Z7901 Long term (current) use of anticoagulants: Secondary | ICD-10-CM | POA: Diagnosis not present

## 2021-01-11 LAB — TSH: TSH: 4.403 u[IU]/mL (ref 0.350–4.500)

## 2021-01-11 LAB — HEPATIC FUNCTION PANEL
ALT: 36 — AB (ref 7–35)
AST: 41 — AB (ref 13–35)
Alkaline Phosphatase: 102 (ref 25–125)
Bilirubin, Total: 0.3

## 2021-01-11 LAB — COMPREHENSIVE METABOLIC PANEL
Albumin: 4.2 (ref 3.5–5.0)
Calcium: 8.2 — AB (ref 8.7–10.7)

## 2021-01-11 LAB — CBC AND DIFFERENTIAL
HCT: 30 — AB (ref 36–46)
Hemoglobin: 9.9 — AB (ref 12.0–16.0)
Neutrophils Absolute: 2.99
Platelets: 133 — AB (ref 150–399)
WBC: 4.1

## 2021-01-11 LAB — CBC
MCV: 90 (ref 81–99)
RBC: 3.34 — AB (ref 3.87–5.11)

## 2021-01-11 LAB — BASIC METABOLIC PANEL
BUN: 17 (ref 4–21)
CO2: 29 — AB (ref 13–22)
Chloride: 101 (ref 99–108)
Creatinine: 0.9 (ref 0.5–1.1)
Glucose: 134
Potassium: 3.8 (ref 3.4–5.3)
Sodium: 139 (ref 137–147)

## 2021-01-11 MED ORDER — POTASSIUM CHLORIDE ER 20 MEQ PO TBCR: 1.0000 | EXTENDED_RELEASE_TABLET | Freq: Two times a day (BID) | ORAL | 5 refills | Status: DC

## 2021-01-11 MED ORDER — HYDROCODONE-ACETAMINOPHEN 5-325 MG PO TABS: 1.0000 | ORAL_TABLET | ORAL | 0 refills | Status: DC | PRN

## 2021-01-11 NOTE — Telephone Encounter (Signed)
Per 10/20 LOS, patient scheduled for Nov Appt's.  Gave patient Appt Summary

## 2021-01-12 ENCOUNTER — Encounter: Payer: Self-pay | Admitting: Oncology

## 2021-01-12 LAB — T4: T4, Total: 9.2 ug/dL (ref 4.5–12.0)

## 2021-01-12 LAB — CEA: CEA: 6.4 ng/mL — ABNORMAL HIGH (ref 0.0–4.7)

## 2021-01-12 MED FILL — Etoposide Inj 1 GM/50ML (20 MG/ML): INTRAVENOUS | Qty: 8 | Status: AC

## 2021-01-12 MED FILL — Famotidine in NaCl 0.9% IV Soln 20 MG/50ML: INTRAVENOUS | Qty: 100 | Status: AC

## 2021-01-12 MED FILL — Fosaprepitant Dimeglumine For IV Infusion 150 MG (Base Eq): INTRAVENOUS | Qty: 5 | Status: AC

## 2021-01-12 MED FILL — Dexamethasone Sodium Phosphate Inj 100 MG/10ML: INTRAMUSCULAR | Qty: 1 | Status: AC

## 2021-01-12 MED FILL — Atezolizumab IV Soln 1200 MG/20ML: INTRAVENOUS | Qty: 20 | Status: AC

## 2021-01-12 MED FILL — Carboplatin IV Soln 450 MG/45ML: INTRAVENOUS | Qty: 36 | Status: AC

## 2021-01-12 NOTE — Addendum Note (Signed)
Addended by: Juanetta Beets on: 01/12/2021 10:48 AM   Modules accepted: Orders

## 2021-01-15 ENCOUNTER — Inpatient Hospital Stay: Payer: Medicare Other

## 2021-01-15 VITALS — BP 146/88 | HR 95 | Temp 98.4°F | Resp 18

## 2021-01-15 DIAGNOSIS — Z5189 Encounter for other specified aftercare: Secondary | ICD-10-CM | POA: Diagnosis not present

## 2021-01-15 DIAGNOSIS — Z79899 Other long term (current) drug therapy: Secondary | ICD-10-CM | POA: Diagnosis not present

## 2021-01-15 DIAGNOSIS — Z23 Encounter for immunization: Secondary | ICD-10-CM | POA: Diagnosis not present

## 2021-01-15 DIAGNOSIS — Z86711 Personal history of pulmonary embolism: Secondary | ICD-10-CM | POA: Diagnosis not present

## 2021-01-15 DIAGNOSIS — E871 Hypo-osmolality and hyponatremia: Secondary | ICD-10-CM

## 2021-01-15 DIAGNOSIS — C3411 Malignant neoplasm of upper lobe, right bronchus or lung: Secondary | ICD-10-CM | POA: Diagnosis not present

## 2021-01-15 DIAGNOSIS — E222 Syndrome of inappropriate secretion of antidiuretic hormone: Secondary | ICD-10-CM

## 2021-01-15 DIAGNOSIS — Z9981 Dependence on supplemental oxygen: Secondary | ICD-10-CM | POA: Diagnosis not present

## 2021-01-15 DIAGNOSIS — J449 Chronic obstructive pulmonary disease, unspecified: Secondary | ICD-10-CM | POA: Diagnosis not present

## 2021-01-15 DIAGNOSIS — Z85038 Personal history of other malignant neoplasm of large intestine: Secondary | ICD-10-CM | POA: Diagnosis not present

## 2021-01-15 DIAGNOSIS — C7951 Secondary malignant neoplasm of bone: Secondary | ICD-10-CM | POA: Diagnosis not present

## 2021-01-15 DIAGNOSIS — E039 Hypothyroidism, unspecified: Secondary | ICD-10-CM | POA: Diagnosis not present

## 2021-01-15 DIAGNOSIS — C3432 Malignant neoplasm of lower lobe, left bronchus or lung: Secondary | ICD-10-CM

## 2021-01-15 DIAGNOSIS — Z87891 Personal history of nicotine dependence: Secondary | ICD-10-CM | POA: Diagnosis not present

## 2021-01-15 DIAGNOSIS — Z5111 Encounter for antineoplastic chemotherapy: Secondary | ICD-10-CM | POA: Diagnosis not present

## 2021-01-15 DIAGNOSIS — Z5112 Encounter for antineoplastic immunotherapy: Secondary | ICD-10-CM | POA: Diagnosis not present

## 2021-01-15 DIAGNOSIS — D241 Benign neoplasm of right breast: Secondary | ICD-10-CM | POA: Diagnosis not present

## 2021-01-15 DIAGNOSIS — Z7901 Long term (current) use of anticoagulants: Secondary | ICD-10-CM | POA: Diagnosis not present

## 2021-01-15 MED ORDER — SODIUM CHLORIDE 0.9 % IV SOLN
75.0000 mg/m2 | Freq: Once | INTRAVENOUS | Status: AC
  Administered 2021-01-15: 160 mg via INTRAVENOUS
  Filled 2021-01-15: qty 8

## 2021-01-15 MED ORDER — SODIUM CHLORIDE 0.9 % IV SOLN
1200.0000 mg | Freq: Once | INTRAVENOUS | Status: AC
  Administered 2021-01-15: 1200 mg via INTRAVENOUS
  Filled 2021-01-15: qty 20

## 2021-01-15 MED ORDER — FAMOTIDINE 20 MG IN NS 100 ML IVPB
20.0000 mg | Freq: Once | INTRAVENOUS | Status: AC
Start: 2021-01-15 — End: 2021-01-15
  Administered 2021-01-15: 20 mg via INTRAVENOUS
  Filled 2021-01-15: qty 20

## 2021-01-15 MED ORDER — HEPARIN SOD (PORK) LOCK FLUSH 100 UNIT/ML IV SOLN
500.0000 [IU] | Freq: Once | INTRAVENOUS | Status: AC | PRN
  Administered 2021-01-15: 500 [IU]

## 2021-01-15 MED ORDER — SODIUM CHLORIDE 0.9 % IV SOLN
150.0000 mg | Freq: Once | INTRAVENOUS | Status: AC
  Administered 2021-01-15: 150 mg via INTRAVENOUS
  Filled 2021-01-15: qty 150

## 2021-01-15 MED ORDER — SODIUM CHLORIDE 0.9 % IV SOLN
364.4500 mg | Freq: Once | INTRAVENOUS | Status: AC
  Administered 2021-01-15: 360 mg via INTRAVENOUS
  Filled 2021-01-15: qty 36

## 2021-01-15 MED ORDER — SODIUM CHLORIDE 0.9 % IV SOLN: Freq: Once | INTRAVENOUS | Status: AC

## 2021-01-15 MED ORDER — SODIUM CHLORIDE 0.9% FLUSH
10.0000 mL | INTRAVENOUS | Status: DC | PRN
  Administered 2021-01-15: 10 mL

## 2021-01-15 MED ORDER — PALONOSETRON HCL INJECTION 0.25 MG/5ML
0.2500 mg | Freq: Once | INTRAVENOUS | Status: AC
  Administered 2021-01-15: 0.25 mg via INTRAVENOUS
  Filled 2021-01-15: qty 5

## 2021-01-15 MED ORDER — DIPHENHYDRAMINE HCL 50 MG/ML IJ SOLN
50.0000 mg | Freq: Once | INTRAMUSCULAR | Status: AC
  Administered 2021-01-15: 50 mg via INTRAVENOUS
  Filled 2021-01-15: qty 1

## 2021-01-15 MED ORDER — SODIUM CHLORIDE 0.9 % IV SOLN
10.0000 mg | Freq: Once | INTRAVENOUS | Status: AC
  Administered 2021-01-15: 10 mg via INTRAVENOUS
  Filled 2021-01-15: qty 10

## 2021-01-15 MED FILL — Etoposide Inj 1 GM/50ML (20 MG/ML): INTRAVENOUS | Qty: 8 | Status: AC

## 2021-01-15 MED FILL — Dexamethasone Sodium Phosphate Inj 100 MG/10ML: INTRAMUSCULAR | Qty: 1 | Status: AC

## 2021-01-15 NOTE — Patient Instructions (Signed)
Carboplatin injection What is this medication? CARBOPLATIN (KAR boe pla tin) is a chemotherapy drug. It targets fast dividing cells, like cancer cells, and causes these cells to die. This medicine is used to treat ovarian cancer and many other cancers. This medicine may be used for other purposes; ask your health care provider or pharmacist if you have questions. COMMON BRAND NAME(S): Paraplatin What should I tell my care team before I take this medication? They need to know if you have any of these conditions: blood disorders hearing problems kidney disease recent or ongoing radiation therapy an unusual or allergic reaction to carboplatin, cisplatin, other chemotherapy, other medicines, foods, dyes, or preservatives pregnant or trying to get pregnant breast-feeding How should I use this medication? This drug is usually given as an infusion into a vein. It is administered in a hospital or clinic by a specially trained health care professional. Talk to your pediatrician regarding the use of this medicine in children. Special care may be needed. Overdosage: If you think you have taken too much of this medicine contact a poison control center or emergency room at once. NOTE: This medicine is only for you. Do not share this medicine with others. What if I miss a dose? It is important not to miss a dose. Call your doctor or health care professional if you are unable to keep an appointment. What may interact with this medication? medicines for seizures medicines to increase blood counts like filgrastim, pegfilgrastim, sargramostim some antibiotics like amikacin, gentamicin, neomycin, streptomycin, tobramycin vaccines Talk to your doctor or health care professional before taking any of these medicines: acetaminophen aspirin ibuprofen ketoprofen naproxen This list may not describe all possible interactions. Give your health care provider a list of all the medicines, herbs, non-prescription  drugs, or dietary supplements you use. Also tell them if you smoke, drink alcohol, or use illegal drugs. Some items may interact with your medicine. What should I watch for while using this medication? Your condition will be monitored carefully while you are receiving this medicine. You will need important blood work done while you are taking this medicine. This drug may make you feel generally unwell. This is not uncommon, as chemotherapy can affect healthy cells as well as cancer cells. Report any side effects. Continue your course of treatment even though you feel ill unless your doctor tells you to stop. In some cases, you may be given additional medicines to help with side effects. Follow all directions for their use. Call your doctor or health care professional for advice if you get a fever, chills or sore throat, or other symptoms of a cold or flu. Do not treat yourself. This drug decreases your body's ability to fight infections. Try to avoid being around people who are sick. This medicine may increase your risk to bruise or bleed. Call your doctor or health care professional if you notice any unusual bleeding. Be careful brushing and flossing your teeth or using a toothpick because you may get an infection or bleed more easily. If you have any dental work done, tell your dentist you are receiving this medicine. Avoid taking products that contain aspirin, acetaminophen, ibuprofen, naproxen, or ketoprofen unless instructed by your doctor. These medicines may hide a fever. Do not become pregnant while taking this medicine. Women should inform their doctor if they wish to become pregnant or think they might be pregnant. There is a potential for serious side effects to an unborn child. Talk to your health care professional or pharmacist  for more information. Do not breast-feed an infant while taking this medicine. What side effects may I notice from receiving this medication? Side effects that you  should report to your doctor or health care professional as soon as possible: allergic reactions like skin rash, itching or hives, swelling of the face, lips, or tongue signs of infection - fever or chills, cough, sore throat, pain or difficulty passing urine signs of decreased platelets or bleeding - bruising, pinpoint red spots on the skin, black, tarry stools, nosebleeds signs of decreased red blood cells - unusually weak or tired, fainting spells, lightheadedness breathing problems changes in hearing changes in vision chest pain high blood pressure low blood counts - This drug may decrease the number of white blood cells, red blood cells and platelets. You may be at increased risk for infections and bleeding. nausea and vomiting pain, swelling, redness or irritation at the injection site pain, tingling, numbness in the hands or feet problems with balance, talking, walking trouble passing urine or change in the amount of urine Side effects that usually do not require medical attention (report to your doctor or health care professional if they continue or are bothersome): hair loss loss of appetite metallic taste in the mouth or changes in taste This list may not describe all possible side effects. Call your doctor for medical advice about side effects. You may report side effects to FDA at 1-800-FDA-1088. Where should I keep my medication? This drug is given in a hospital or clinic and will not be stored at home. NOTE: This sheet is a summary. It may not cover all possible information. If you have questions about this medicine, talk to your doctor, pharmacist, or health care provider.  2022 Else[employer]/Gold Standard (2007-06-16 14:38:05) Atezolizumab injection What is this medication? ATEZOLIZUMAB (a te zoe LIZ ue mab) is a monoclonal antibody. It is used to treat bladder cancer (urothelial cancer), liver cancer, lung cancer, and melanoma. This medicine may be used for other purposes;  ask your health care provider or pharmacist if you have questions. COMMON BRAND NAME(S): Tecentriq What should I tell my care team before I take this medication? They need to know if you have any of these conditions: autoimmune diseases like Crohn's disease, ulcerative colitis, or lupus have had or planning to have an allogeneic stem cell transplant (uses someone else's stem cells) history of organ transplant history of radiation to the chest nervous system problems like myasthenia gravis or Guillain-Barre syndrome an unusual or allergic reaction to atezolizumab, other medicines, foods, dyes, or preservatives pregnant or trying to get pregnant breast-feeding How should I use this medication? This medicine is for infusion into a vein. It is given by a health care professional in a hospital or clinic setting. A special MedGuide will be given to you before each treatment. Be sure to read this information carefully each time. Talk to your pediatrician regarding the use of this medicine in children. Special care may be needed. Overdosage: If you think you have taken too much of this medicine contact a poison control center or emergency room at once. NOTE: This medicine is only for you. Do not share this medicine with others. What if I miss a dose? It is important not to miss your dose. Call your doctor or health care professional if you are unable to keep an appointment. What may interact with this medication? Interactions have not been studied. This list may not describe all possible interactions. Give your health care provider a list of  all the medicines, herbs, non-prescription drugs, or dietary supplements you use. Also tell them if you smoke, drink alcohol, or use illegal drugs. Some items may interact with your medicine. What should I watch for while using this medication? Your condition will be monitored carefully while you are receiving this medicine. You may need blood work done while  you are taking this medicine. Do not become pregnant while taking this medicine or for at least 5 months after stopping it. Women should inform their doctor if they wish to become pregnant or think they might be pregnant. There is a potential for serious side effects to an unborn child. Talk to your health care professional or pharmacist for more information. Do not breast-feed an infant while taking this medicine or for at least 5 months after the last dose. What side effects may I notice from receiving this medication? Side effects that you should report to your doctor or health care professional as soon as possible: allergic reactions like skin rash, itching or hives, swelling of the face, lips, or tongue black, tarry stools bloody or watery diarrhea breathing problems changes in vision chest pain or chest tightness chills facial flushing fever headache signs and symptoms of high blood sugar such as dizziness; dry mouth; dry skin; fruity breath; nausea; stomach pain; increased hunger or thirst; increased urination signs and symptoms of liver injury like dark yellow or brown urine; general ill feeling or flu-like symptoms; light-colored stools; loss of appetite; nausea; right upper belly pain; unusually weak or tired; yellowing of the eyes or skin stomach pain trouble passing urine or change in the amount of urine Side effects that usually do not require medical attention (report to your doctor or health care professional if they continue or are bothersome): bone pain cough diarrhea joint pain muscle pain muscle weakness swelling of arms or legs tiredness weight loss This list may not describe all possible side effects. Call your doctor for medical advice about side effects. You may report side effects to FDA at 1-800-FDA-1088. Where should I keep my medication? This drug is given in a hospital or clinic and will not be stored at home. NOTE: This sheet is a summary. It may not cover  all possible information. If you have questions about this medicine, talk to your doctor, pharmacist, or health care provider.  2022 Else[employer]/Gold Standard (2019-12-09 13:59:34) Etoposide, VP-16 injection What is this medication? ETOPOSIDE, VP-16 (e toe POE side) is a chemotherapy drug. It is used to treat testicular cancer, lung cancer, and other cancers. This medicine may be used for other purposes; ask your health care provider or pharmacist if you have questions. COMMON BRAND NAME(S): Etopophos, Toposar, VePesid What should I tell my care team before I take this medication? They need to know if you have any of these conditions: infection kidney disease liver disease low blood counts, like low white cell, platelet, or red cell counts an unusual or allergic reaction to etoposide, other medicines, foods, dyes, or preservatives pregnant or trying to get pregnant breast-feeding How should I use this medication? This medicine is for infusion into a vein. It is administered in a hospital or clinic by a specially trained health care professional. Talk to your pediatrician regarding the use of this medicine in children. Special care may be needed. Overdosage: If you think you have taken too much of this medicine contact a poison control center or emergency room at once. NOTE: This medicine is only for you. Do not share this medicine with  others. What if I miss a dose? It is important not to miss your dose. Call your doctor or health care professional if you are unable to keep an appointment. What may interact with this medication? This medicine may interact with the following medications: warfarin This list may not describe all possible interactions. Give your health care provider a list of all the medicines, herbs, non-prescription drugs, or dietary supplements you use. Also tell them if you smoke, drink alcohol, or use illegal drugs. Some items may interact with your medicine. What should I  watch for while using this medication? Visit your doctor for checks on your progress. This drug may make you feel generally unwell. This is not uncommon, as chemotherapy can affect healthy cells as well as cancer cells. Report any side effects. Continue your course of treatment even though you feel ill unless your doctor tells you to stop. In some cases, you may be given additional medicines to help with side effects. Follow all directions for their use. Call your doctor or health care professional for advice if you get a fever, chills or sore throat, or other symptoms of a cold or flu. Do not treat yourself. This drug decreases your body's ability to fight infections. Try to avoid being around people who are sick. This medicine may increase your risk to bruise or bleed. Call your doctor or health care professional if you notice any unusual bleeding. Talk to your doctor about your risk of cancer. You may be more at risk for certain types of cancers if you take this medicine. Do not become pregnant while taking this medicine or for at least 6 months after stopping it. Women should inform their doctor if they wish to become pregnant or think they might be pregnant. Women of child-bearing potential will need to have a negative pregnancy test before starting this medicine. There is a potential for serious side effects to an unborn child. Talk to your health care professional or pharmacist for more information. Do not breast-feed an infant while taking this medicine. Men must use a latex condom during sexual contact with a woman while taking this medicine and for at least 4 months after stopping it. A latex condom is needed even if you have had a vasectomy. Contact your doctor right away if your partner becomes pregnant. Do not donate sperm while taking this medicine and for at least 4 months after you stop taking this medicine. Men should inform their doctors if they wish to father a child. This medicine may  lower sperm counts. What side effects may I notice from receiving this medication? Side effects that you should report to your doctor or health care professional as soon as possible: allergic reactions like skin rash, itching or hives, swelling of the face, lips, or tongue low blood counts - this medicine may decrease the number of white blood cells, red blood cells, and platelets. You may be at increased risk for infections and bleeding nausea, vomiting redness, blistering, peeling or loosening of the skin, including inside the mouth signs and symptoms of infection like fever; chills; cough; sore throat; pain or trouble passing urine signs and symptoms of low red blood cells or anemia such as unusually weak or tired; feeling faint or lightheaded; falls; breathing problems unusual bruising or bleeding Side effects that usually do not require medical attention (report to your doctor or health care professional if they continue or are bothersome): changes in taste diarrhea hair loss loss of appetite mouth sores This  list may not describe all possible side effects. Call your doctor for medical advice about side effects. You may report side effects to FDA at 1-800-FDA-1088. Where should I keep my medication? This drug is given in a hospital or clinic and will not be stored at home. NOTE: This sheet is a summary. It may not cover all possible information. If you have questions about this medicine, talk to your doctor, pharmacist, or health care provider.  2022 [employer]/Gold Standard (2018-05-06 16:57:15)

## 2021-01-16 ENCOUNTER — Inpatient Hospital Stay: Payer: Medicare Other

## 2021-01-16 ENCOUNTER — Other Ambulatory Visit: Payer: Self-pay

## 2021-01-16 VITALS — BP 145/74 | HR 89 | Resp 18 | Ht 63.0 in | Wt 223.0 lb

## 2021-01-16 DIAGNOSIS — C3411 Malignant neoplasm of upper lobe, right bronchus or lung: Secondary | ICD-10-CM | POA: Diagnosis not present

## 2021-01-16 DIAGNOSIS — Z9981 Dependence on supplemental oxygen: Secondary | ICD-10-CM | POA: Diagnosis not present

## 2021-01-16 DIAGNOSIS — Z79899 Other long term (current) drug therapy: Secondary | ICD-10-CM | POA: Diagnosis not present

## 2021-01-16 DIAGNOSIS — Z5111 Encounter for antineoplastic chemotherapy: Secondary | ICD-10-CM | POA: Diagnosis not present

## 2021-01-16 DIAGNOSIS — Z5189 Encounter for other specified aftercare: Secondary | ICD-10-CM | POA: Diagnosis not present

## 2021-01-16 DIAGNOSIS — Z85038 Personal history of other malignant neoplasm of large intestine: Secondary | ICD-10-CM | POA: Diagnosis not present

## 2021-01-16 DIAGNOSIS — Z5112 Encounter for antineoplastic immunotherapy: Secondary | ICD-10-CM | POA: Diagnosis not present

## 2021-01-16 DIAGNOSIS — Z23 Encounter for immunization: Secondary | ICD-10-CM | POA: Diagnosis not present

## 2021-01-16 DIAGNOSIS — D241 Benign neoplasm of right breast: Secondary | ICD-10-CM | POA: Diagnosis not present

## 2021-01-16 DIAGNOSIS — E871 Hypo-osmolality and hyponatremia: Secondary | ICD-10-CM

## 2021-01-16 DIAGNOSIS — Z87891 Personal history of nicotine dependence: Secondary | ICD-10-CM | POA: Diagnosis not present

## 2021-01-16 DIAGNOSIS — E222 Syndrome of inappropriate secretion of antidiuretic hormone: Secondary | ICD-10-CM

## 2021-01-16 DIAGNOSIS — J449 Chronic obstructive pulmonary disease, unspecified: Secondary | ICD-10-CM | POA: Diagnosis not present

## 2021-01-16 DIAGNOSIS — C7951 Secondary malignant neoplasm of bone: Secondary | ICD-10-CM | POA: Diagnosis not present

## 2021-01-16 DIAGNOSIS — E039 Hypothyroidism, unspecified: Secondary | ICD-10-CM | POA: Diagnosis not present

## 2021-01-16 DIAGNOSIS — Z7901 Long term (current) use of anticoagulants: Secondary | ICD-10-CM | POA: Diagnosis not present

## 2021-01-16 DIAGNOSIS — C3432 Malignant neoplasm of lower lobe, left bronchus or lung: Secondary | ICD-10-CM

## 2021-01-16 DIAGNOSIS — Z86711 Personal history of pulmonary embolism: Secondary | ICD-10-CM | POA: Diagnosis not present

## 2021-01-16 MED ORDER — HEPARIN SOD (PORK) LOCK FLUSH 100 UNIT/ML IV SOLN
500.0000 [IU] | Freq: Once | INTRAVENOUS | Status: AC | PRN
  Administered 2021-01-16: 500 [IU]

## 2021-01-16 MED ORDER — SODIUM CHLORIDE 0.9 % IV SOLN
75.0000 mg/m2 | Freq: Once | INTRAVENOUS | Status: AC
  Administered 2021-01-16: 160 mg via INTRAVENOUS
  Filled 2021-01-16: qty 8

## 2021-01-16 MED ORDER — SODIUM CHLORIDE 0.9 % IV SOLN
Freq: Once | INTRAVENOUS | Status: AC
Start: 2021-01-16 — End: 2021-01-16

## 2021-01-16 MED ORDER — SODIUM CHLORIDE 0.9% FLUSH
10.0000 mL | INTRAVENOUS | Status: DC | PRN
  Administered 2021-01-16: 10 mL

## 2021-01-16 MED ORDER — SODIUM CHLORIDE 0.9 % IV SOLN
10.0000 mg | Freq: Once | INTRAVENOUS | Status: AC
  Administered 2021-01-16: 10 mg via INTRAVENOUS
  Filled 2021-01-16: qty 10

## 2021-01-16 MED FILL — Etoposide Inj 1 GM/50ML (20 MG/ML): INTRAVENOUS | Qty: 8 | Status: AC

## 2021-01-16 NOTE — Patient Instructions (Signed)
Etoposide, VP-16 injection What is this medication? ETOPOSIDE, VP-16 (e toe POE side) is a chemotherapy drug. It is used to treat testicular cancer, lung cancer, and other cancers. This medicine may be used for other purposes; ask your health care provider or pharmacist if you have questions. COMMON BRAND NAME(S): Etopophos, Toposar, VePesid What should I tell my care team before I take this medication? They need to know if you have any of these conditions: infection kidney disease liver disease low blood counts, like low white cell, platelet, or red cell counts an unusual or allergic reaction to etoposide, other medicines, foods, dyes, or preservatives pregnant or trying to get pregnant breast-feeding How should I use this medication? This medicine is for infusion into a vein. It is administered in a hospital or clinic by a specially trained health care professional. Talk to your pediatrician regarding the use of this medicine in children. Special care may be needed. Overdosage: If you think you have taken too much of this medicine contact a poison control center or emergency room at once. NOTE: This medicine is only for you. Do not share this medicine with others. What if I miss a dose? It is important not to miss your dose. Call your doctor or health care professional if you are unable to keep an appointment. What may interact with this medication? This medicine may interact with the following medications: warfarin This list may not describe all possible interactions. Give your health care provider a list of all the medicines, herbs, non-prescription drugs, or dietary supplements you use. Also tell them if you smoke, drink alcohol, or use illegal drugs. Some items may interact with your medicine. What should I watch for while using this medication? Visit your doctor for checks on your progress. This drug may make you feel generally unwell. This is not uncommon, as chemotherapy can affect  healthy cells as well as cancer cells. Report any side effects. Continue your course of treatment even though you feel ill unless your doctor tells you to stop. In some cases, you may be given additional medicines to help with side effects. Follow all directions for their use. Call your doctor or health care professional for advice if you get a fever, chills or sore throat, or other symptoms of a cold or flu. Do not treat yourself. This drug decreases your body's ability to fight infections. Try to avoid being around people who are sick. This medicine may increase your risk to bruise or bleed. Call your doctor or health care professional if you notice any unusual bleeding. Talk to your doctor about your risk of cancer. You may be more at risk for certain types of cancers if you take this medicine. Do not become pregnant while taking this medicine or for at least 6 months after stopping it. Women should inform their doctor if they wish to become pregnant or think they might be pregnant. Women of child-bearing potential will need to have a negative pregnancy test before starting this medicine. There is a potential for serious side effects to an unborn child. Talk to your health care professional or pharmacist for more information. Do not breast-feed an infant while taking this medicine. Men must use a latex condom during sexual contact with a woman while taking this medicine and for at least 4 months after stopping it. A latex condom is needed even if you have had a vasectomy. Contact your doctor right away if your partner becomes pregnant. Do not donate sperm while taking this  medicine and for at least 4 months after you stop taking this medicine. Men should inform their doctors if they wish to father a child. This medicine may lower sperm counts. What side effects may I notice from receiving this medication? Side effects that you should report to your doctor or health care professional as soon as  possible: allergic reactions like skin rash, itching or hives, swelling of the face, lips, or tongue low blood counts - this medicine may decrease the number of white blood cells, red blood cells, and platelets. You may be at increased risk for infections and bleeding nausea, vomiting redness, blistering, peeling or loosening of the skin, including inside the mouth signs and symptoms of infection like fever; chills; cough; sore throat; pain or trouble passing urine signs and symptoms of low red blood cells or anemia such as unusually weak or tired; feeling faint or lightheaded; falls; breathing problems unusual bruising or bleeding Side effects that usually do not require medical attention (report to your doctor or health care professional if they continue or are bothersome): changes in taste diarrhea hair loss loss of appetite mouth sores This list may not describe all possible side effects. Call your doctor for medical advice about side effects. You may report side effects to FDA at 1-800-FDA-1088. Where should I keep my medication? This drug is given in a hospital or clinic and will not be stored at home. NOTE: This sheet is a summary. It may not cover all possible information. If you have questions about this medicine, talk to your doctor, pharmacist, or health care provider.  2022 [employer]vier/Gold Standard (2018-05-06 16:57:15)

## 2021-01-17 ENCOUNTER — Inpatient Hospital Stay: Payer: Medicare Other

## 2021-01-17 ENCOUNTER — Encounter: Payer: Self-pay | Admitting: Oncology

## 2021-01-17 VITALS — BP 141/71 | HR 77 | Temp 98.1°F | Resp 18 | Ht 63.0 in | Wt 224.0 lb

## 2021-01-17 DIAGNOSIS — E039 Hypothyroidism, unspecified: Secondary | ICD-10-CM | POA: Diagnosis not present

## 2021-01-17 DIAGNOSIS — Z7901 Long term (current) use of anticoagulants: Secondary | ICD-10-CM | POA: Diagnosis not present

## 2021-01-17 DIAGNOSIS — C3411 Malignant neoplasm of upper lobe, right bronchus or lung: Secondary | ICD-10-CM | POA: Diagnosis not present

## 2021-01-17 DIAGNOSIS — Z87891 Personal history of nicotine dependence: Secondary | ICD-10-CM | POA: Diagnosis not present

## 2021-01-17 DIAGNOSIS — Z86711 Personal history of pulmonary embolism: Secondary | ICD-10-CM | POA: Diagnosis not present

## 2021-01-17 DIAGNOSIS — Z23 Encounter for immunization: Secondary | ICD-10-CM | POA: Diagnosis not present

## 2021-01-17 DIAGNOSIS — E871 Hypo-osmolality and hyponatremia: Secondary | ICD-10-CM | POA: Diagnosis not present

## 2021-01-17 DIAGNOSIS — J45998 Other asthma: Secondary | ICD-10-CM | POA: Diagnosis not present

## 2021-01-17 DIAGNOSIS — Z5111 Encounter for antineoplastic chemotherapy: Secondary | ICD-10-CM | POA: Diagnosis not present

## 2021-01-17 DIAGNOSIS — D241 Benign neoplasm of right breast: Secondary | ICD-10-CM | POA: Diagnosis not present

## 2021-01-17 DIAGNOSIS — E222 Syndrome of inappropriate secretion of antidiuretic hormone: Secondary | ICD-10-CM

## 2021-01-17 DIAGNOSIS — C3432 Malignant neoplasm of lower lobe, left bronchus or lung: Secondary | ICD-10-CM

## 2021-01-17 DIAGNOSIS — Z5189 Encounter for other specified aftercare: Secondary | ICD-10-CM | POA: Diagnosis not present

## 2021-01-17 DIAGNOSIS — Z79899 Other long term (current) drug therapy: Secondary | ICD-10-CM | POA: Diagnosis not present

## 2021-01-17 DIAGNOSIS — Z9981 Dependence on supplemental oxygen: Secondary | ICD-10-CM | POA: Diagnosis not present

## 2021-01-17 DIAGNOSIS — Z85038 Personal history of other malignant neoplasm of large intestine: Secondary | ICD-10-CM | POA: Diagnosis not present

## 2021-01-17 DIAGNOSIS — C7951 Secondary malignant neoplasm of bone: Secondary | ICD-10-CM | POA: Diagnosis not present

## 2021-01-17 DIAGNOSIS — J449 Chronic obstructive pulmonary disease, unspecified: Secondary | ICD-10-CM | POA: Diagnosis not present

## 2021-01-17 DIAGNOSIS — Z5112 Encounter for antineoplastic immunotherapy: Secondary | ICD-10-CM | POA: Diagnosis not present

## 2021-01-17 MED ORDER — SODIUM CHLORIDE 0.9 % IV SOLN: Freq: Once | INTRAVENOUS | Status: AC

## 2021-01-17 MED ORDER — SODIUM CHLORIDE 0.9 % IV SOLN
10.0000 mg | Freq: Once | INTRAVENOUS | Status: AC
  Administered 2021-01-17: 10 mg via INTRAVENOUS
  Filled 2021-01-17: qty 1

## 2021-01-17 MED ORDER — SODIUM CHLORIDE 0.9% FLUSH
10.0000 mL | INTRAVENOUS | Status: DC | PRN
  Administered 2021-01-17: 10 mL

## 2021-01-17 MED ORDER — HEPARIN SOD (PORK) LOCK FLUSH 100 UNIT/ML IV SOLN
500.0000 [IU] | Freq: Once | INTRAVENOUS | Status: AC | PRN
  Administered 2021-01-17: 500 [IU]

## 2021-01-17 MED ORDER — SODIUM CHLORIDE 0.9 % IV SOLN
75.0000 mg/m2 | Freq: Once | INTRAVENOUS | Status: AC
  Administered 2021-01-17: 160 mg via INTRAVENOUS
  Filled 2021-01-17: qty 8

## 2021-01-17 NOTE — Patient Instructions (Signed)
Etoposide, VP-16 injection What is this medication? ETOPOSIDE, VP-16 (e toe POE side) is a chemotherapy drug. It is used to treat testicular cancer, lung cancer, and other cancers. This medicine may be used for other purposes; ask your health care provider or pharmacist if you have questions. COMMON BRAND NAME(S): Etopophos, Toposar, VePesid What should I tell my care team before I take this medication? They need to know if you have any of these conditions: infection kidney disease liver disease low blood counts, like low white cell, platelet, or red cell counts an unusual or allergic reaction to etoposide, other medicines, foods, dyes, or preservatives pregnant or trying to get pregnant breast-feeding How should I use this medication? This medicine is for infusion into a vein. It is administered in a hospital or clinic by a specially trained health care professional. Talk to your pediatrician regarding the use of this medicine in children. Special care may be needed. Overdosage: If you think you have taken too much of this medicine contact a poison control center or emergency room at once. NOTE: This medicine is only for you. Do not share this medicine with others. What if I miss a dose? It is important not to miss your dose. Call your doctor or health care professional if you are unable to keep an appointment. What may interact with this medication? This medicine may interact with the following medications: warfarin This list may not describe all possible interactions. Give your health care provider a list of all the medicines, herbs, non-prescription drugs, or dietary supplements you use. Also tell them if you smoke, drink alcohol, or use illegal drugs. Some items may interact with your medicine. What should I watch for while using this medication? Visit your doctor for checks on your progress. This drug may make you feel generally unwell. This is not uncommon, as chemotherapy can affect  healthy cells as well as cancer cells. Report any side effects. Continue your course of treatment even though you feel ill unless your doctor tells you to stop. In some cases, you may be given additional medicines to help with side effects. Follow all directions for their use. Call your doctor or health care professional for advice if you get a fever, chills or sore throat, or other symptoms of a cold or flu. Do not treat yourself. This drug decreases your body's ability to fight infections. Try to avoid being around people who are sick. This medicine may increase your risk to bruise or bleed. Call your doctor or health care professional if you notice any unusual bleeding. Talk to your doctor about your risk of cancer. You may be more at risk for certain types of cancers if you take this medicine. Do not become pregnant while taking this medicine or for at least 6 months after stopping it. Women should inform their doctor if they wish to become pregnant or think they might be pregnant. Women of child-bearing potential will need to have a negative pregnancy test before starting this medicine. There is a potential for serious side effects to an unborn child. Talk to your health care professional or pharmacist for more information. Do not breast-feed an infant while taking this medicine. Men must use a latex condom during sexual contact with a woman while taking this medicine and for at least 4 months after stopping it. A latex condom is needed even if you have had a vasectomy. Contact your doctor right away if your partner becomes pregnant. Do not donate sperm while taking this  medicine and for at least 4 months after you stop taking this medicine. Men should inform their doctors if they wish to father a child. This medicine may lower sperm counts. What side effects may I notice from receiving this medication? Side effects that you should report to your doctor or health care professional as soon as  possible: allergic reactions like skin rash, itching or hives, swelling of the face, lips, or tongue low blood counts - this medicine may decrease the number of white blood cells, red blood cells, and platelets. You may be at increased risk for infections and bleeding nausea, vomiting redness, blistering, peeling or loosening of the skin, including inside the mouth signs and symptoms of infection like fever; chills; cough; sore throat; pain or trouble passing urine signs and symptoms of low red blood cells or anemia such as unusually weak or tired; feeling faint or lightheaded; falls; breathing problems unusual bruising or bleeding Side effects that usually do not require medical attention (report to your doctor or health care professional if they continue or are bothersome): changes in taste diarrhea hair loss loss of appetite mouth sores This list may not describe all possible side effects. Call your doctor for medical advice about side effects. You may report side effects to FDA at 1-800-FDA-1088. Where should I keep my medication? This drug is given in a hospital or clinic and will not be stored at home. NOTE: This sheet is a summary. It may not cover all possible information. If you have questions about this medicine, talk to your doctor, pharmacist, or health care provider.  2022 [employer]vier/Gold Standard (2018-05-06 16:57:15)

## 2021-01-19 ENCOUNTER — Other Ambulatory Visit: Payer: Self-pay

## 2021-01-19 ENCOUNTER — Inpatient Hospital Stay: Payer: Medicare Other

## 2021-01-19 VITALS — BP 168/77 | HR 88 | Temp 98.6°F | Resp 18 | Ht 63.0 in | Wt 224.0 lb

## 2021-01-19 DIAGNOSIS — E039 Hypothyroidism, unspecified: Secondary | ICD-10-CM | POA: Diagnosis not present

## 2021-01-19 DIAGNOSIS — C3411 Malignant neoplasm of upper lobe, right bronchus or lung: Secondary | ICD-10-CM | POA: Diagnosis not present

## 2021-01-19 DIAGNOSIS — Z9981 Dependence on supplemental oxygen: Secondary | ICD-10-CM | POA: Diagnosis not present

## 2021-01-19 DIAGNOSIS — Z86711 Personal history of pulmonary embolism: Secondary | ICD-10-CM | POA: Diagnosis not present

## 2021-01-19 DIAGNOSIS — Z79899 Other long term (current) drug therapy: Secondary | ICD-10-CM | POA: Diagnosis not present

## 2021-01-19 DIAGNOSIS — C3432 Malignant neoplasm of lower lobe, left bronchus or lung: Secondary | ICD-10-CM

## 2021-01-19 DIAGNOSIS — Z23 Encounter for immunization: Secondary | ICD-10-CM | POA: Diagnosis not present

## 2021-01-19 DIAGNOSIS — J449 Chronic obstructive pulmonary disease, unspecified: Secondary | ICD-10-CM | POA: Diagnosis not present

## 2021-01-19 DIAGNOSIS — Z5111 Encounter for antineoplastic chemotherapy: Secondary | ICD-10-CM | POA: Diagnosis not present

## 2021-01-19 DIAGNOSIS — E871 Hypo-osmolality and hyponatremia: Secondary | ICD-10-CM

## 2021-01-19 DIAGNOSIS — E222 Syndrome of inappropriate secretion of antidiuretic hormone: Secondary | ICD-10-CM

## 2021-01-19 DIAGNOSIS — Z5189 Encounter for other specified aftercare: Secondary | ICD-10-CM | POA: Diagnosis not present

## 2021-01-19 DIAGNOSIS — Z5112 Encounter for antineoplastic immunotherapy: Secondary | ICD-10-CM | POA: Diagnosis not present

## 2021-01-19 DIAGNOSIS — Z85038 Personal history of other malignant neoplasm of large intestine: Secondary | ICD-10-CM | POA: Diagnosis not present

## 2021-01-19 DIAGNOSIS — Z87891 Personal history of nicotine dependence: Secondary | ICD-10-CM | POA: Diagnosis not present

## 2021-01-19 DIAGNOSIS — Z7901 Long term (current) use of anticoagulants: Secondary | ICD-10-CM | POA: Diagnosis not present

## 2021-01-19 DIAGNOSIS — C7951 Secondary malignant neoplasm of bone: Secondary | ICD-10-CM | POA: Diagnosis not present

## 2021-01-19 DIAGNOSIS — D241 Benign neoplasm of right breast: Secondary | ICD-10-CM | POA: Diagnosis not present

## 2021-01-19 MED ORDER — PEGFILGRASTIM-BMEZ 6 MG/0.6ML ~~LOC~~ SOSY
6.0000 mg | PREFILLED_SYRINGE | Freq: Once | SUBCUTANEOUS | Status: AC
  Administered 2021-01-19: 6 mg via SUBCUTANEOUS
  Filled 2021-01-19: qty 0.6

## 2021-01-19 NOTE — Patient Instructions (Signed)

## 2021-01-20 ENCOUNTER — Encounter: Payer: Self-pay | Admitting: Oncology

## 2021-01-23 NOTE — Progress Notes (Signed)
Sent in request for DOS 11/11 to [employer].

## 2021-01-24 ENCOUNTER — Encounter: Payer: Self-pay | Admitting: Oncology

## 2021-01-25 ENCOUNTER — Encounter: Payer: Self-pay | Admitting: Oncology

## 2021-01-29 DIAGNOSIS — J449 Chronic obstructive pulmonary disease, unspecified: Secondary | ICD-10-CM | POA: Diagnosis not present

## 2021-01-29 NOTE — Progress Notes (Signed)
Sent in request for DOS 11/14, 11/15, 11/16, and 11/18 to [employer].

## 2021-02-01 ENCOUNTER — Ambulatory Visit: Payer: Medicare Other | Admitting: Hematology and Oncology

## 2021-02-01 ENCOUNTER — Other Ambulatory Visit: Payer: Medicare Other

## 2021-02-01 ENCOUNTER — Encounter: Payer: Self-pay | Admitting: Oncology

## 2021-02-02 ENCOUNTER — Other Ambulatory Visit: Payer: Self-pay

## 2021-02-02 ENCOUNTER — Encounter: Payer: Self-pay | Admitting: Hematology and Oncology

## 2021-02-02 ENCOUNTER — Other Ambulatory Visit: Payer: Self-pay | Admitting: Pharmacist

## 2021-02-02 ENCOUNTER — Inpatient Hospital Stay: Payer: Medicare Other | Attending: Hematology and Oncology

## 2021-02-02 ENCOUNTER — Inpatient Hospital Stay (INDEPENDENT_AMBULATORY_CARE_PROVIDER_SITE_OTHER): Payer: Medicare Other | Admitting: Hematology and Oncology

## 2021-02-02 ENCOUNTER — Other Ambulatory Visit: Payer: Self-pay | Admitting: Hematology and Oncology

## 2021-02-02 DIAGNOSIS — C7951 Secondary malignant neoplasm of bone: Secondary | ICD-10-CM

## 2021-02-02 DIAGNOSIS — C3411 Malignant neoplasm of upper lobe, right bronchus or lung: Secondary | ICD-10-CM | POA: Insufficient documentation

## 2021-02-02 DIAGNOSIS — Z5189 Encounter for other specified aftercare: Secondary | ICD-10-CM | POA: Diagnosis not present

## 2021-02-02 DIAGNOSIS — E871 Hypo-osmolality and hyponatremia: Secondary | ICD-10-CM

## 2021-02-02 DIAGNOSIS — C3432 Malignant neoplasm of lower lobe, left bronchus or lung: Secondary | ICD-10-CM

## 2021-02-02 DIAGNOSIS — Z5111 Encounter for antineoplastic chemotherapy: Secondary | ICD-10-CM | POA: Diagnosis not present

## 2021-02-02 DIAGNOSIS — Z5112 Encounter for antineoplastic immunotherapy: Secondary | ICD-10-CM | POA: Diagnosis not present

## 2021-02-02 DIAGNOSIS — Z7901 Long term (current) use of anticoagulants: Secondary | ICD-10-CM | POA: Insufficient documentation

## 2021-02-02 DIAGNOSIS — Z79899 Other long term (current) drug therapy: Secondary | ICD-10-CM | POA: Insufficient documentation

## 2021-02-02 DIAGNOSIS — C7952 Secondary malignant neoplasm of bone marrow: Secondary | ICD-10-CM | POA: Diagnosis not present

## 2021-02-02 DIAGNOSIS — E222 Syndrome of inappropriate secretion of antidiuretic hormone: Secondary | ICD-10-CM

## 2021-02-02 LAB — HEPATIC FUNCTION PANEL
ALT: 19 (ref 7–35)
AST: 22 (ref 13–35)
Alkaline Phosphatase: 91 (ref 25–125)
Bilirubin, Total: 0.4

## 2021-02-02 LAB — BASIC METABOLIC PANEL
BUN: 14 (ref 4–21)
CO2: 26 — AB (ref 13–22)
Chloride: 102 (ref 99–108)
Creatinine: 0.7 (ref 0.5–1.1)
Glucose: 165
Potassium: 4.3 (ref 3.4–5.3)
Sodium: 136 — AB (ref 137–147)

## 2021-02-02 LAB — CBC AND DIFFERENTIAL
HCT: 25 — AB (ref 36–46)
Hemoglobin: 8.5 — AB (ref 12.0–16.0)
Neutrophils Absolute: 6.63
Platelets: 213 (ref 150–399)
WBC: 8.5

## 2021-02-02 LAB — COMPREHENSIVE METABOLIC PANEL
Albumin: 3.8 (ref 3.5–5.0)
Calcium: 8.5 — AB (ref 8.7–10.7)

## 2021-02-02 LAB — TSH: TSH: 6.098 u[IU]/mL — ABNORMAL HIGH (ref 0.350–4.500)

## 2021-02-02 LAB — CBC: RBC: 2.85 — AB (ref 3.87–5.11)

## 2021-02-02 MED ORDER — VITAMIN D3 1.25 MG (50000 UT) PO CAPS: 1.2500 mg | ORAL_CAPSULE | ORAL | 3 refills | Status: AC

## 2021-02-02 MED ORDER — VITAMIN D3 1.25 MG (50000 UT) PO CAPS: 1.2500 mg | ORAL_CAPSULE | Freq: Every day | ORAL | 3 refills | Status: DC

## 2021-02-02 MED FILL — Etoposide Inj 1 GM/50ML (20 MG/ML): INTRAVENOUS | Qty: 8 | Status: AC

## 2021-02-02 MED FILL — Atezolizumab IV Soln 1200 MG/20ML: INTRAVENOUS | Qty: 20 | Status: AC

## 2021-02-02 MED FILL — Famotidine in NaCl 0.9% IV Soln 20 MG/50ML: INTRAVENOUS | Qty: 100 | Status: AC

## 2021-02-02 MED FILL — Dexamethasone Sodium Phosphate Inj 100 MG/10ML: INTRAMUSCULAR | Qty: 1 | Status: AC

## 2021-02-02 MED FILL — Carboplatin IV Soln 450 MG/45ML: INTRAVENOUS | Qty: 36 | Status: AC

## 2021-02-02 MED FILL — Fosaprepitant Dimeglumine For IV Infusion 150 MG (Base Eq): INTRAVENOUS | Qty: 5 | Status: AC

## 2021-02-02 NOTE — Assessment & Plan Note (Signed)
She has been receiving zometa for her bone mets; however, her calcium has been low since August and remains low today at 8.5. We will continue to hold and re-evaluate in December.

## 2021-02-02 NOTE — Assessment & Plan Note (Signed)
She is tolerating Carboplatin, etoposide and atezolizumab well. She will proceed with cycle 3 on Monday. CBC and CMP are unremarkable today other than a hemoglobin of 8.5. As she remains asymptomatic, we will observe. We can transfuse next week if she develops symptoms. She will return to clinic in 3 weeks for repeat evaluation.

## 2021-02-02 NOTE — Progress Notes (Signed)
Hold zometa on 11/14 due to hypocalcemia

## 2021-02-02 NOTE — Assessment & Plan Note (Signed)
Calcium remains low at 8.5. She continues daily supplement.

## 2021-02-02 NOTE — Assessment & Plan Note (Signed)
Sodium 136 today. She remains on one sodium tablet daily. She would like to continue with one tablet as she does not want to risk another hospitalization.

## 2021-02-02 NOTE — Progress Notes (Signed)
Patient Care Team: Helen Hashimoto., MD as PCP - General (Internal Medicine) Gardiner Rhyme, MD as Attending Physician (Pulmonary Disease) Governor Rooks., DO as Attending Physician (General Surgery)  Clinic Day:  02/02/2021  Referring physician: Helen Hashimoto., MD  ASSESSMENT & PLAN:   Assessment & Plan: Small cell carcinoma of lower lobe of left lung [facility]) She is tolerating Carboplatin, etoposide and atezolizumab well. She will proceed with cycle 3 on Monday. CBC and CMP are unremarkable today other than a hemoglobin of 8.5. As she remains asymptomatic, we will observe. We can transfuse next week if she develops symptoms. She will return to clinic in 3 weeks for repeat evaluation.   Hyponatremia Sodium 136 today. She remains on one sodium tablet daily. She would like to continue with one tablet as she does not want to risk another hospitalization.   Secondary malignant neoplasm of bone and bone marrow ([place]) She has been receiving zometa for her bone mets; however, her calcium has been low since August and remains low today at 8.5. We will continue to hold and re-evaluate in December.   Hypocalcemia Calcium remains low at 8.5. She continues daily supplement.    The patient understands the plans discussed today and is in agreement with them.  She knows to contact our office if she develops concerns prior to her next appointment.    [name], NP  [facility] [address] [place] [address] Dept: [phone] Dept Fax: [phone]   No orders of the defined types were placed in this encounter.     CHIEF COMPLAINT:  CC: A 80 year old female with history of small cell lung cancer here for 3 week follow up  Current Treatment:  Carboplatin, Etoposide, Atezolizumab  INTERVAL HISTORY:  Gabrielle Hudson is here today for repeat clinical assessment. She denies fevers or chills. She denies pain.  Her appetite is good. Her weight has been stable.  I have reviewed the past medical history, past surgical history, social history and family history with the patient and they are unchanged from previous note.  ALLERGIES:  is allergic to nitroglycerin in d5w and nitroglycerin.  MEDICATIONS:  Current Outpatient Medications  Medication Sig Dispense Refill   albuterol (PROVENTIL) (5 MG/ML) 0.5% nebulizer solution Take 2.5 mg by nebulization every 6 (six) hours as needed for wheezing or shortness of breath.     albuterol (VENTOLIN HFA) 108 (90 Base) MCG/ACT inhaler Inhale into the lungs every 6 (six) hours as needed for wheezing or shortness of breath.     budesonide-formoterol (SYMBICORT) 160-4.5 MCG/ACT inhaler Inhale 2 puffs into the lungs 2 (two) times daily.       calcium citrate-vitamin D (CITRACAL+D) 315-200 MG-UNIT tablet Take 1 tablet by mouth in the morning, at noon, and at bedtime.     Cholecalciferol (VITAMIN D3) 1.25 MG (50000 UT) CAPS Take 1.25 mg by mouth daily. 90 capsule 3   cyclobenzaprine (FLEXERIL) 10 MG tablet Take 1 tablet (10 mg total) by mouth 3 (three) times daily as needed for muscle spasms. 30 tablet 3   diltiazem (CARDIZEM CD) 120 MG 24 hr capsule Take 120 mg by mouth daily.     ELIQUIS 5 MG TABS tablet Take 1 tablet by mouth twice daily 60 tablet 2   famotidine (PEPCID) 20 MG tablet Take 20 mg by mouth 2 (two) times daily.     furosemide (LASIX) 20 MG tablet Take 20 mg by mouth as needed.  ONLY TAKES IT HAVING SWELLING, can take 2 tablets it more than a 3 lb weight gain ever night     gabapentin (NEURONTIN) 300 MG capsule Take 1 capsule (300 mg total) by mouth 2 (two) times daily. 60 capsule 4   HYDROcodone-acetaminophen (NORCO/VICODIN) 5-325 MG tablet Take 1 tablet by mouth every 4 (four) hours as needed for moderate pain. 30 tablet 0   levothyroxine (SYNTHROID) 100 MCG tablet Take 1 tablet (100 mcg total) by mouth daily before breakfast. 30 tablet 5   loratadine  (CLARITIN) 10 MG tablet Take 10 mg by mouth daily.     meclizine (ANTIVERT) 25 MG tablet Take 0.5-1 tablets (12.5-25 mg total) by mouth 3 (three) times daily as needed for dizziness. 120 tablet 3   OXYGEN Inhale 3 L into the lungs continuous.     Potassium Chloride ER 20 MEQ TBCR Take 1 tablet by mouth 2 (two) times daily. 60 tablet 5   rOPINIRole (REQUIP) 0.5 MG tablet Take 0.5 mg by mouth at bedtime.     sodium chloride 1 g tablet Take 1 g by mouth daily.     No current facility-administered medications for this visit.    HISTORY OF PRESENT ILLNESS:   Oncology History  Small cell carcinoma of lower lobe of left lung ([place])  01/08/2020 Imaging   CTA CHEST WITH CONTRAST: No evidence of pulmonary embolus.   Tumor encases and narrows the left lower lobe pulmonary artery in the inferior left hilum measuring up to 5.2 cm. This is increased from 1.9 cm previously.   Irregular calcified and noncalcified plaque in the descending thoracic aorta. Coronary artery disease.   Left lower lobe atelectasis.   Aortic Atherosclerosis (ICD10-I70.0) and Emphysema (ICD10-J43.9).    01/11/2020 Pathology Results   LEFT LOWER LOBE LUNG MASS, BIOPSY:               SMALL CELL CARCINOMA WITH CRUSH ARTIFACT   01/20/2020 Initial Diagnosis   Lung cancer, lower lobe ([place])   01/21/2020 Cancer Staging   Staging form: Lung, AJCC 8th Edition - Pathologic stage from 01/21/2020: Stage IIIB (pT3, pN2, cM0) - Signed by Derwood Kaplan, MD on 01/21/2020    01/31/2020 - 04/28/2020 Chemotherapy   Carboplatin/etoposide        06/08/2020 - 10/13/2020 Chemotherapy   Nivolumab        08/28/2020 Imaging   CT CHEST, ABDOMEN AND PELVIS WITHOUT CONTRAST: 1. Progressive soft tissue thickening between the aorta and the left mainstem bronchus suspicious for recurrent tumor. PET-CT may be helpful for further evaluation if clinically indicated. 2. Stable radiation changes involving the right hilum and right lower  lobe. 3. Stable severe emphysematous changes and pulmonary scarring. 4. Stable right hepatic lobe lesion. 5. Stable scattered sclerotic bone lesions. 6. Stable advanced atherosclerotic calcifications involving the thoracic and abdominal aorta and branch vessels including the coronary arteries. 7. Chronic bilateral hip AVN. 8. Emphysema and aortic atherosclerosis.   10/15/2020 Imaging   CTA CHEST WITH CONTRAST: 1. No evidence of a pulmonary embolism. 2. No acute findings. 3. Soft tissue mass adjacent to the left peripheral mainstem bronchus has increased in size from the prior CT concerning for recurrence or progression of carcinoma. 4. No other change. 5. Advanced emphysema, coronary artery calcifications and aortic atherosclerosis.   12/22/2020 Imaging   CT CHEST, ABDOMEN AND PELVIS WITH CONTRAST: 1. Interval mixed response to therapy. 2. There has been interval resolution of left hilar adenopathy. Masslike architectural distortion within the superior segment  of right lower lobe is slightly decreased in size in the interval. 3. Interval enlargement of subcarinal lymph node concerning for metastatic adenopathy. 4. There are several new, low-attenuation lesions within both lobes of liver. Worrisome for metastatic disease. 5. Stable appearance of multifocal sclerotic bone metastases. 6. Aortic Atherosclerosis (ICD10-I70.0) and Emphysema (ICD10-J43.9).   12/25/2020 -  Chemotherapy   Patient is on Treatment Plan : LUNG SCLC Carboplatin + Etoposide + Atezolizumab Induction q21d / Atezolizumab Maintenance q21d     Secondary malignant neoplasm of bone and bone marrow ([place])  05/29/2020 Imaging   CT CHEST, ABDOMEN AND PELVIS WITH CONTRAST: 1. New osseous metastatic disease. 2. Post radiation changes in the right hemithorax, stable. 3. Aortic atherosclerosis (ICD10-I70.0). Coronary artery calcification. 4. Emphysema (ICD10-J43.9).       REVIEW OF SYSTEMS:   Constitutional: Denies  fevers, chills or abnormal weight loss Eyes: Denies blurriness of vision Ears, nose, mouth, throat, and face: Denies mucositis or sore throat Respiratory: Denies cough, dyspnea or wheezes Cardiovascular: Denies palpitation, chest discomfort or lower extremity swelling Gastrointestinal:  Denies nausea, heartburn or change in bowel habits Skin: Denies abnormal skin rashes Lymphatics: Denies new lymphadenopathy or easy bruising Neurological:Denies numbness, tingling or new weaknesses Behavioral/Psych: Mood is stable, no new changes  All other systems were reviewed with the patient and are negative.   VITALS:  Blood pressure (!) 152/72, pulse 97, temperature 98.4 F (36.9 C), temperature source Oral, resp. rate 20, height 5\' 3"  (1.6 m), weight 223 lb (101.2 kg), SpO2 96 %.  Wt Readings from Last 3 Encounters:  02/02/21 223 lb (101.2 kg)  01/19/21 224 lb (101.6 kg)  01/17/21 224 lb (101.6 kg)    Body mass index is 39.5 kg/m.  Performance status (ECOG): 2 - Symptomatic, <50% confined to bed  PHYSICAL EXAM:   GENERAL:alert, no distress and comfortable SKIN: skin color, texture, turgor are normal, no rashes or significant lesions EYES: normal, Conjunctiva are pink and non-injected, sclera clear OROPHARYNX:no exudate, no erythema and lips, buccal mucosa, and tongue normal  NECK: supple, thyroid normal size, non-tender, without nodularity LYMPH:  no palpable lymphadenopathy in the cervical, axillary or inguinal LUNGS: clear to auscultation and percussion with normal breathing effort HEART: regular rate & rhythm and no murmurs and no lower extremity edema ABDOMEN:abdomen soft, non-tender and normal bowel sounds Musculoskeletal:no cyanosis of digits and no clubbing  NEURO: alert & oriented x 3 with fluent speech, no focal motor/sensory deficits  LABORATORY DATA:  I have reviewed the data as listed    Component Value Date/Time   NA 136 (A) 02/02/2021 0000   K 4.3 02/02/2021 0000   CL  102 02/02/2021 0000   CO2 26 (A) 02/02/2021 0000   GLUCOSE 111 (H) 08/30/2020 1533   BUN 14 02/02/2021 0000   CREATININE 0.7 02/02/2021 0000   CREATININE 1.02 (H) 08/30/2020 1533   CALCIUM 8.5 (A) 02/02/2021 0000   PROT 6.6 08/30/2020 1533   ALBUMIN 3.8 02/02/2021 0000   AST 22 02/02/2021 0000   AST 13 (L) 08/30/2020 1533   ALT 19 02/02/2021 0000   ALT 12 08/30/2020 1533   ALKPHOS 91 02/02/2021 0000   BILITOT 0.1 (L) 08/30/2020 1533   GFRNONAA 56 (L) 08/30/2020 1533   GFRAA 24 (L) 04/01/2011 0349    No results found for: SPEP, UPEP  Lab Results  Component Value Date   WBC 8.5 02/02/2021   NEUTROABS 6.63 02/02/2021   HGB 8.5 (A) 02/02/2021   HCT 25 (A) 02/02/2021  MCV 90 01/11/2021   PLT 213 02/02/2021      Chemistry      Component Value Date/Time   NA 136 (A) 02/02/2021 0000   K 4.3 02/02/2021 0000   CL 102 02/02/2021 0000   CO2 26 (A) 02/02/2021 0000   BUN 14 02/02/2021 0000   CREATININE 0.7 02/02/2021 0000   CREATININE 1.02 (H) 08/30/2020 1533   GLU 165 02/02/2021 0000      Component Value Date/Time   CALCIUM 8.5 (A) 02/02/2021 0000   ALKPHOS 91 02/02/2021 0000   AST 22 02/02/2021 0000   AST 13 (L) 08/30/2020 1533   ALT 19 02/02/2021 0000   ALT 12 08/30/2020 1533   BILITOT 0.1 (L) 08/30/2020 1533       RADIOGRAPHIC STUDIES: I have personally reviewed the radiological images as listed and agreed with the findings in the report. No results found.

## 2021-02-03 LAB — T4: T4, Total: 8.4 ug/dL (ref 4.5–12.0)

## 2021-02-03 LAB — CEA: CEA: 6.5 ng/mL — ABNORMAL HIGH (ref 0.0–4.7)

## 2021-02-05 ENCOUNTER — Other Ambulatory Visit: Payer: Self-pay

## 2021-02-05 ENCOUNTER — Inpatient Hospital Stay: Payer: Medicare Other

## 2021-02-05 VITALS — BP 141/66 | HR 102 | Temp 98.7°F | Resp 20 | Ht 63.0 in | Wt 225.8 lb

## 2021-02-05 DIAGNOSIS — C3432 Malignant neoplasm of lower lobe, left bronchus or lung: Secondary | ICD-10-CM

## 2021-02-05 DIAGNOSIS — C3411 Malignant neoplasm of upper lobe, right bronchus or lung: Secondary | ICD-10-CM | POA: Diagnosis not present

## 2021-02-05 DIAGNOSIS — Z79899 Other long term (current) drug therapy: Secondary | ICD-10-CM | POA: Diagnosis not present

## 2021-02-05 DIAGNOSIS — Z5112 Encounter for antineoplastic immunotherapy: Secondary | ICD-10-CM | POA: Diagnosis not present

## 2021-02-05 DIAGNOSIS — Z5111 Encounter for antineoplastic chemotherapy: Secondary | ICD-10-CM | POA: Diagnosis not present

## 2021-02-05 DIAGNOSIS — E222 Syndrome of inappropriate secretion of antidiuretic hormone: Secondary | ICD-10-CM

## 2021-02-05 DIAGNOSIS — E871 Hypo-osmolality and hyponatremia: Secondary | ICD-10-CM

## 2021-02-05 DIAGNOSIS — Z5189 Encounter for other specified aftercare: Secondary | ICD-10-CM | POA: Diagnosis not present

## 2021-02-05 DIAGNOSIS — C7951 Secondary malignant neoplasm of bone: Secondary | ICD-10-CM | POA: Diagnosis not present

## 2021-02-05 DIAGNOSIS — Z7901 Long term (current) use of anticoagulants: Secondary | ICD-10-CM | POA: Diagnosis not present

## 2021-02-05 MED ORDER — SODIUM CHLORIDE 0.9 % IV SOLN
150.0000 mg | Freq: Once | INTRAVENOUS | Status: AC
  Administered 2021-02-05: 150 mg via INTRAVENOUS
  Filled 2021-02-05: qty 150

## 2021-02-05 MED ORDER — SODIUM CHLORIDE 0.9 % IV SOLN
10.0000 mg | Freq: Once | INTRAVENOUS | Status: AC
  Administered 2021-02-05: 10 mg via INTRAVENOUS
  Filled 2021-02-05: qty 10

## 2021-02-05 MED ORDER — FAMOTIDINE 20 MG IN NS 100 ML IVPB
20.0000 mg | Freq: Once | INTRAVENOUS | Status: AC
  Administered 2021-02-05: 20 mg via INTRAVENOUS
  Filled 2021-02-05: qty 20

## 2021-02-05 MED ORDER — SODIUM CHLORIDE 0.9 % IV SOLN
364.4500 mg | Freq: Once | INTRAVENOUS | Status: AC
  Administered 2021-02-05: 360 mg via INTRAVENOUS
  Filled 2021-02-05: qty 36

## 2021-02-05 MED ORDER — SODIUM CHLORIDE 0.9% FLUSH
10.0000 mL | INTRAVENOUS | Status: DC | PRN
  Administered 2021-02-05: 10 mL

## 2021-02-05 MED ORDER — DIPHENHYDRAMINE HCL 50 MG/ML IJ SOLN
50.0000 mg | Freq: Once | INTRAMUSCULAR | Status: AC
  Administered 2021-02-05: 50 mg via INTRAVENOUS
  Filled 2021-02-05: qty 1

## 2021-02-05 MED ORDER — SODIUM CHLORIDE 0.9 % IV SOLN
1200.0000 mg | Freq: Once | INTRAVENOUS | Status: AC
  Administered 2021-02-05: 1200 mg via INTRAVENOUS
  Filled 2021-02-05: qty 20

## 2021-02-05 MED ORDER — HEPARIN SOD (PORK) LOCK FLUSH 100 UNIT/ML IV SOLN
500.0000 [IU] | Freq: Once | INTRAVENOUS | Status: AC | PRN
  Administered 2021-02-05: 500 [IU]

## 2021-02-05 MED ORDER — PALONOSETRON HCL INJECTION 0.25 MG/5ML
0.2500 mg | Freq: Once | INTRAVENOUS | Status: AC
  Administered 2021-02-05: 0.25 mg via INTRAVENOUS
  Filled 2021-02-05: qty 5

## 2021-02-05 MED ORDER — SODIUM CHLORIDE 0.9 % IV SOLN
75.0000 mg/m2 | Freq: Once | INTRAVENOUS | Status: AC
  Administered 2021-02-05: 160 mg via INTRAVENOUS
  Filled 2021-02-05: qty 8

## 2021-02-05 MED ORDER — SODIUM CHLORIDE 0.9 % IV SOLN: Freq: Once | INTRAVENOUS | Status: AC

## 2021-02-05 MED FILL — Dexamethasone Sodium Phosphate Inj 100 MG/10ML: INTRAMUSCULAR | Qty: 1 | Status: AC

## 2021-02-05 MED FILL — Etoposide Inj 1 GM/50ML (20 MG/ML): INTRAVENOUS | Qty: 8 | Status: AC

## 2021-02-05 NOTE — Patient Instructions (Signed)
Atezolizumab injection What is this medication? ATEZOLIZUMAB (a te zoe LIZ ue mab) is a monoclonal antibody. It is used to treat bladder cancer (urothelial cancer), liver cancer, lung cancer, and melanoma. This medicine may be used for other purposes; ask your health care provider or pharmacist if you have questions. COMMON BRAND NAME(S): Tecentriq What should I tell my care team before I take this medication? They need to know if you have any of these conditions: autoimmune diseases like Crohn's disease, ulcerative colitis, or lupus have had or planning to have an allogeneic stem cell transplant (uses someone else's stem cells) history of organ transplant history of radiation to the chest nervous system problems like myasthenia gravis or Guillain-Barre syndrome an unusual or allergic reaction to atezolizumab, other medicines, foods, dyes, or preservatives pregnant or trying to get pregnant breast-feeding How should I use this medication? This medicine is for infusion into a vein. It is given by a health care professional in a hospital or clinic setting. A special MedGuide will be given to you before each treatment. Be sure to read this information carefully each time. Talk to your pediatrician regarding the use of this medicine in children. Special care may be needed. Overdosage: If you think you have taken too much of this medicine contact a poison control center or emergency room at once. NOTE: This medicine is only for you. Do not share this medicine with others. What if I miss a dose? It is important not to miss your dose. Call your doctor or health care professional if you are unable to keep an appointment. What may interact with this medication? Interactions have not been studied. This list may not describe all possible interactions. Give your health care provider a list of all the medicines, herbs, non-prescription drugs, or dietary supplements you use. Also tell them if you smoke,  drink alcohol, or use illegal drugs. Some items may interact with your medicine. What should I watch for while using this medication? Your condition will be monitored carefully while you are receiving this medicine. You may need blood work done while you are taking this medicine. Do not become pregnant while taking this medicine or for at least 5 months after stopping it. Women should inform their doctor if they wish to become pregnant or think they might be pregnant. There is a potential for serious side effects to an unborn child. Talk to your health care professional or pharmacist for more information. Do not breast-feed an infant while taking this medicine or for at least 5 months after the last dose. What side effects may I notice from receiving this medication? Side effects that you should report to your doctor or health care professional as soon as possible: allergic reactions like skin rash, itching or hives, swelling of the face, lips, or tongue black, tarry stools bloody or watery diarrhea breathing problems changes in vision chest pain or chest tightness chills facial flushing fever headache signs and symptoms of high blood sugar such as dizziness; dry mouth; dry skin; fruity breath; nausea; stomach pain; increased hunger or thirst; increased urination signs and symptoms of liver injury like dark yellow or brown urine; general ill feeling or flu-like symptoms; light-colored stools; loss of appetite; nausea; right upper belly pain; unusually weak or tired; yellowing of the eyes or skin stomach pain trouble passing urine or change in the amount of urine Side effects that usually do not require medical attention (report to your doctor or health care professional if they continue or are  bothersome): bone pain cough diarrhea joint pain muscle pain muscle weakness swelling of arms or legs tiredness weight loss This list may not describe all possible side effects. Call your doctor  for medical advice about side effects. You may report side effects to FDA at 1-800-FDA-1088. Where should I keep my medication? This drug is given in a hospital or clinic and will not be stored at home. NOTE: This sheet is a summary. It may not cover all possible information. If you have questions about this medicine, talk to your doctor, pharmacist, or health care provider.  2022 [employer] Standard (2020-11-28 00:00:00) Carboplatin injection What is this medication? CARBOPLATIN (KAR boe pla tin) is a chemotherapy drug. It targets fast dividing cells, like cancer cells, and causes these cells to die. This medicine is used to treat ovarian cancer and many other cancers. This medicine may be used for other purposes; ask your health care provider or pharmacist if you have questions. COMMON BRAND NAME(S): Paraplatin What should I tell my care team before I take this medication? They need to know if you have any of these conditions: blood disorders hearing problems kidney disease recent or ongoing radiation therapy an unusual or allergic reaction to carboplatin, cisplatin, other chemotherapy, other medicines, foods, dyes, or preservatives pregnant or trying to get pregnant breast-feeding How should I use this medication? This drug is usually given as an infusion into a vein. It is administered in a hospital or clinic by a specially trained health care professional. Talk to your pediatrician regarding the use of this medicine in children. Special care may be needed. Overdosage: If you think you have taken too much of this medicine contact a poison control center or emergency room at once. NOTE: This medicine is only for you. Do not share this medicine with others. What if I miss a dose? It is important not to miss a dose. Call your doctor or health care professional if you are unable to keep an appointment. What may interact with this medication? medicines for seizures medicines to  increase blood counts like filgrastim, pegfilgrastim, sargramostim some antibiotics like amikacin, gentamicin, neomycin, streptomycin, tobramycin vaccines Talk to your doctor or health care professional before taking any of these medicines: acetaminophen aspirin ibuprofen ketoprofen naproxen This list may not describe all possible interactions. Give your health care provider a list of all the medicines, herbs, non-prescription drugs, or dietary supplements you use. Also tell them if you smoke, drink alcohol, or use illegal drugs. Some items may interact with your medicine. What should I watch for while using this medication? Your condition will be monitored carefully while you are receiving this medicine. You will need important blood work done while you are taking this medicine. This drug may make you feel generally unwell. This is not uncommon, as chemotherapy can affect healthy cells as well as cancer cells. Report any side effects. Continue your course of treatment even though you feel ill unless your doctor tells you to stop. In some cases, you may be given additional medicines to help with side effects. Follow all directions for their use. Call your doctor or health care professional for advice if you get a fever, chills or sore throat, or other symptoms of a cold or flu. Do not treat yourself. This drug decreases your body's ability to fight infections. Try to avoid being around people who are sick. This medicine may increase your risk to bruise or bleed. Call your doctor or health care professional if you notice any unusual  bleeding. Be careful brushing and flossing your teeth or using a toothpick because you may get an infection or bleed more easily. If you have any dental work done, tell your dentist you are receiving this medicine. Avoid taking products that contain aspirin, acetaminophen, ibuprofen, naproxen, or ketoprofen unless instructed by your doctor. These medicines may hide a  fever. Do not become pregnant while taking this medicine. Women should inform their doctor if they wish to become pregnant or think they might be pregnant. There is a potential for serious side effects to an unborn child. Talk to your health care professional or pharmacist for more information. Do not breast-feed an infant while taking this medicine. What side effects may I notice from receiving this medication? Side effects that you should report to your doctor or health care professional as soon as possible: allergic reactions like skin rash, itching or hives, swelling of the face, lips, or tongue signs of infection - fever or chills, cough, sore throat, pain or difficulty passing urine signs of decreased platelets or bleeding - bruising, pinpoint red spots on the skin, black, tarry stools, nosebleeds signs of decreased red blood cells - unusually weak or tired, fainting spells, lightheadedness breathing problems changes in hearing changes in vision chest pain high blood pressure low blood counts - This drug may decrease the number of white blood cells, red blood cells and platelets. You may be at increased risk for infections and bleeding. nausea and vomiting pain, swelling, redness or irritation at the injection site pain, tingling, numbness in the hands or feet problems with balance, talking, walking trouble passing urine or change in the amount of urine Side effects that usually do not require medical attention (report to your doctor or health care professional if they continue or are bothersome): hair loss loss of appetite metallic taste in the mouth or changes in taste This list may not describe all possible side effects. Call your doctor for medical advice about side effects. You may report side effects to FDA at 1-800-FDA-1088. Where should I keep my medication? This drug is given in a hospital or clinic and will not be stored at home. NOTE: This sheet is a summary. It may not  cover all possible information. If you have questions about this medicine, talk to your doctor, pharmacist, or health care provider.  2022 [employer]/Gold Standard (2007-08-19 00:00:00) Etoposide, VP-16 injection What is this medication? ETOPOSIDE, VP-16 (e toe POE side) is a chemotherapy drug. It is used to treat testicular cancer, lung cancer, and other cancers. This medicine may be used for other purposes; ask your health care provider or pharmacist if you have questions. COMMON BRAND NAME(S): Etopophos, Toposar, VePesid What should I tell my care team before I take this medication? They need to know if you have any of these conditions: infection kidney disease liver disease low blood counts, like low white cell, platelet, or red cell counts an unusual or allergic reaction to etoposide, other medicines, foods, dyes, or preservatives pregnant or trying to get pregnant breast-feeding How should I use this medication? This medicine is for infusion into a vein. It is administered in a hospital or clinic by a specially trained health care professional. Talk to your pediatrician regarding the use of this medicine in children. Special care may be needed. Overdosage: If you think you have taken too much of this medicine contact a poison control center or emergency room at once. NOTE: This medicine is only for you. Do not share this medicine with  others. What if I miss a dose? It is important not to miss your dose. Call your doctor or health care professional if you are unable to keep an appointment. What may interact with this medication? This medicine may interact with the following medications: warfarin This list may not describe all possible interactions. Give your health care provider a list of all the medicines, herbs, non-prescription drugs, or dietary supplements you use. Also tell them if you smoke, drink alcohol, or use illegal drugs. Some items may interact with your medicine. What  should I watch for while using this medication? Visit your doctor for checks on your progress. This drug may make you feel generally unwell. This is not uncommon, as chemotherapy can affect healthy cells as well as cancer cells. Report any side effects. Continue your course of treatment even though you feel ill unless your doctor tells you to stop. In some cases, you may be given additional medicines to help with side effects. Follow all directions for their use. Call your doctor or health care professional for advice if you get a fever, chills or sore throat, or other symptoms of a cold or flu. Do not treat yourself. This drug decreases your body's ability to fight infections. Try to avoid being around people who are sick. This medicine may increase your risk to bruise or bleed. Call your doctor or health care professional if you notice any unusual bleeding. Talk to your doctor about your risk of cancer. You may be more at risk for certain types of cancers if you take this medicine. Do not become pregnant while taking this medicine or for at least 6 months after stopping it. Women should inform their doctor if they wish to become pregnant or think they might be pregnant. Women of child-bearing potential will need to have a negative pregnancy test before starting this medicine. There is a potential for serious side effects to an unborn child. Talk to your health care professional or pharmacist for more information. Do not breast-feed an infant while taking this medicine. Men must use a latex condom during sexual contact with a woman while taking this medicine and for at least 4 months after stopping it. A latex condom is needed even if you have had a vasectomy. Contact your doctor right away if your partner becomes pregnant. Do not donate sperm while taking this medicine and for at least 4 months after you stop taking this medicine. Men should inform their doctors if they wish to father a child. This medicine  may lower sperm counts. What side effects may I notice from receiving this medication? Side effects that you should report to your doctor or health care professional as soon as possible: allergic reactions like skin rash, itching or hives, swelling of the face, lips, or tongue low blood counts - this medicine may decrease the number of white blood cells, red blood cells, and platelets. You may be at increased risk for infections and bleeding nausea, vomiting redness, blistering, peeling or loosening of the skin, including inside the mouth signs and symptoms of infection like fever; chills; cough; sore throat; pain or trouble passing urine signs and symptoms of low red blood cells or anemia such as unusually weak or tired; feeling faint or lightheaded; falls; breathing problems unusual bruising or bleeding Side effects that usually do not require medical attention (report to your doctor or health care professional if they continue or are bothersome): changes in taste diarrhea hair loss loss of appetite mouth sores This  list may not describe all possible side effects. Call your doctor for medical advice about side effects. You may report side effects to FDA at 1-800-FDA-1088. Where should I keep my medication? This drug is given in a hospital or clinic and will not be stored at home. NOTE: This sheet is a summary. It may not cover all possible information. If you have questions about this medicine, talk to your doctor, pharmacist, or health care provider.  2022 [employer] Standard (2020-11-28 00:00:00)

## 2021-02-06 ENCOUNTER — Inpatient Hospital Stay: Payer: Medicare Other

## 2021-02-06 VITALS — BP 148/70 | HR 98 | Temp 98.2°F | Resp 18 | Ht 63.0 in | Wt 223.0 lb

## 2021-02-06 DIAGNOSIS — Z7901 Long term (current) use of anticoagulants: Secondary | ICD-10-CM | POA: Diagnosis not present

## 2021-02-06 DIAGNOSIS — Z5112 Encounter for antineoplastic immunotherapy: Secondary | ICD-10-CM | POA: Diagnosis not present

## 2021-02-06 DIAGNOSIS — C3411 Malignant neoplasm of upper lobe, right bronchus or lung: Secondary | ICD-10-CM | POA: Diagnosis not present

## 2021-02-06 DIAGNOSIS — E871 Hypo-osmolality and hyponatremia: Secondary | ICD-10-CM

## 2021-02-06 DIAGNOSIS — Z79899 Other long term (current) drug therapy: Secondary | ICD-10-CM | POA: Diagnosis not present

## 2021-02-06 DIAGNOSIS — E222 Syndrome of inappropriate secretion of antidiuretic hormone: Secondary | ICD-10-CM

## 2021-02-06 DIAGNOSIS — Z5111 Encounter for antineoplastic chemotherapy: Secondary | ICD-10-CM | POA: Diagnosis not present

## 2021-02-06 DIAGNOSIS — C3432 Malignant neoplasm of lower lobe, left bronchus or lung: Secondary | ICD-10-CM

## 2021-02-06 DIAGNOSIS — Z5189 Encounter for other specified aftercare: Secondary | ICD-10-CM | POA: Diagnosis not present

## 2021-02-06 DIAGNOSIS — C7951 Secondary malignant neoplasm of bone: Secondary | ICD-10-CM | POA: Diagnosis not present

## 2021-02-06 MED ORDER — SODIUM CHLORIDE 0.9 % IV SOLN
75.0000 mg/m2 | Freq: Once | INTRAVENOUS | Status: AC
  Administered 2021-02-06: 160 mg via INTRAVENOUS
  Filled 2021-02-06: qty 8

## 2021-02-06 MED ORDER — SODIUM CHLORIDE 0.9% FLUSH
10.0000 mL | INTRAVENOUS | Status: DC | PRN
  Administered 2021-02-06: 10 mL

## 2021-02-06 MED ORDER — HEPARIN SOD (PORK) LOCK FLUSH 100 UNIT/ML IV SOLN
500.0000 [IU] | Freq: Once | INTRAVENOUS | Status: AC | PRN
  Administered 2021-02-06: 500 [IU]

## 2021-02-06 MED ORDER — SODIUM CHLORIDE 0.9 % IV SOLN
10.0000 mg | Freq: Once | INTRAVENOUS | Status: AC
  Administered 2021-02-06: 10 mg via INTRAVENOUS
  Filled 2021-02-06: qty 10

## 2021-02-06 MED ORDER — SODIUM CHLORIDE 0.9 % IV SOLN: Freq: Once | INTRAVENOUS | Status: AC

## 2021-02-06 MED FILL — Dexamethasone Sodium Phosphate Inj 100 MG/10ML: INTRAMUSCULAR | Qty: 1 | Status: AC

## 2021-02-06 MED FILL — Etoposide Inj 1 GM/50ML (20 MG/ML): INTRAVENOUS | Qty: 8 | Status: AC

## 2021-02-06 NOTE — Patient Instructions (Signed)
Etoposide, VP-16 injection What is this medication? ETOPOSIDE, VP-16 (e toe POE side) is a chemotherapy drug. It is used to treat testicular cancer, lung cancer, and other cancers. This medicine may be used for other purposes; ask your health care provider or pharmacist if you have questions. COMMON BRAND NAME(S): Etopophos, Toposar, VePesid What should I tell my care team before I take this medication? They need to know if you have any of these conditions: infection kidney disease liver disease low blood counts, like low white cell, platelet, or red cell counts an unusual or allergic reaction to etoposide, other medicines, foods, dyes, or preservatives pregnant or trying to get pregnant breast-feeding How should I use this medication? This medicine is for infusion into a vein. It is administered in a hospital or clinic by a specially trained health care professional. Talk to your pediatrician regarding the use of this medicine in children. Special care may be needed. Overdosage: If you think you have taken too much of this medicine contact a poison control center or emergency room at once. NOTE: This medicine is only for you. Do not share this medicine with others. What if I miss a dose? It is important not to miss your dose. Call your doctor or health care professional if you are unable to keep an appointment. What may interact with this medication? This medicine may interact with the following medications: warfarin This list may not describe all possible interactions. Give your health care provider a list of all the medicines, herbs, non-prescription drugs, or dietary supplements you use. Also tell them if you smoke, drink alcohol, or use illegal drugs. Some items may interact with your medicine. What should I watch for while using this medication? Visit your doctor for checks on your progress. This drug may make you feel generally unwell. This is not uncommon, as chemotherapy can affect  healthy cells as well as cancer cells. Report any side effects. Continue your course of treatment even though you feel ill unless your doctor tells you to stop. In some cases, you may be given additional medicines to help with side effects. Follow all directions for their use. Call your doctor or health care professional for advice if you get a fever, chills or sore throat, or other symptoms of a cold or flu. Do not treat yourself. This drug decreases your body's ability to fight infections. Try to avoid being around people who are sick. This medicine may increase your risk to bruise or bleed. Call your doctor or health care professional if you notice any unusual bleeding. Talk to your doctor about your risk of cancer. You may be more at risk for certain types of cancers if you take this medicine. Do not become pregnant while taking this medicine or for at least 6 months after stopping it. Women should inform their doctor if they wish to become pregnant or think they might be pregnant. Women of child-bearing potential will need to have a negative pregnancy test before starting this medicine. There is a potential for serious side effects to an unborn child. Talk to your health care professional or pharmacist for more information. Do not breast-feed an infant while taking this medicine. Men must use a latex condom during sexual contact with a woman while taking this medicine and for at least 4 months after stopping it. A latex condom is needed even if you have had a vasectomy. Contact your doctor right away if your partner becomes pregnant. Do not donate sperm while taking this  medicine and for at least 4 months after you stop taking this medicine. Men should inform their doctors if they wish to father a child. This medicine may lower sperm counts. What side effects may I notice from receiving this medication? Side effects that you should report to your doctor or health care professional as soon as  possible: allergic reactions like skin rash, itching or hives, swelling of the face, lips, or tongue low blood counts - this medicine may decrease the number of white blood cells, red blood cells, and platelets. You may be at increased risk for infections and bleeding nausea, vomiting redness, blistering, peeling or loosening of the skin, including inside the mouth signs and symptoms of infection like fever; chills; cough; sore throat; pain or trouble passing urine signs and symptoms of low red blood cells or anemia such as unusually weak or tired; feeling faint or lightheaded; falls; breathing problems unusual bruising or bleeding Side effects that usually do not require medical attention (report to your doctor or health care professional if they continue or are bothersome): changes in taste diarrhea hair loss loss of appetite mouth sores This list may not describe all possible side effects. Call your doctor for medical advice about side effects. You may report side effects to FDA at 1-800-FDA-1088. Where should I keep my medication? This drug is given in a hospital or clinic and will not be stored at home. NOTE: This sheet is a summary. It may not cover all possible information. If you have questions about this medicine, talk to your doctor, pharmacist, or health care provider.  2022 [employer] Standard (2020-11-28 00:00:00)

## 2021-02-07 ENCOUNTER — Other Ambulatory Visit: Payer: Self-pay

## 2021-02-07 ENCOUNTER — Encounter: Payer: Self-pay | Admitting: Oncology

## 2021-02-07 ENCOUNTER — Inpatient Hospital Stay: Payer: Medicare Other

## 2021-02-07 DIAGNOSIS — C3411 Malignant neoplasm of upper lobe, right bronchus or lung: Secondary | ICD-10-CM | POA: Diagnosis not present

## 2021-02-07 DIAGNOSIS — Z79899 Other long term (current) drug therapy: Secondary | ICD-10-CM | POA: Diagnosis not present

## 2021-02-07 DIAGNOSIS — E222 Syndrome of inappropriate secretion of antidiuretic hormone: Secondary | ICD-10-CM

## 2021-02-07 DIAGNOSIS — Z7901 Long term (current) use of anticoagulants: Secondary | ICD-10-CM | POA: Diagnosis not present

## 2021-02-07 DIAGNOSIS — C3432 Malignant neoplasm of lower lobe, left bronchus or lung: Secondary | ICD-10-CM

## 2021-02-07 DIAGNOSIS — Z5189 Encounter for other specified aftercare: Secondary | ICD-10-CM | POA: Diagnosis not present

## 2021-02-07 DIAGNOSIS — E871 Hypo-osmolality and hyponatremia: Secondary | ICD-10-CM

## 2021-02-07 DIAGNOSIS — C7951 Secondary malignant neoplasm of bone: Secondary | ICD-10-CM | POA: Diagnosis not present

## 2021-02-07 DIAGNOSIS — Z5111 Encounter for antineoplastic chemotherapy: Secondary | ICD-10-CM | POA: Diagnosis not present

## 2021-02-07 DIAGNOSIS — Z5112 Encounter for antineoplastic immunotherapy: Secondary | ICD-10-CM | POA: Diagnosis not present

## 2021-02-07 MED ORDER — SODIUM CHLORIDE 0.9% FLUSH
10.0000 mL | INTRAVENOUS | Status: DC | PRN
  Administered 2021-02-07: 10 mL

## 2021-02-07 MED ORDER — SODIUM CHLORIDE 0.9 % IV SOLN
75.0000 mg/m2 | Freq: Once | INTRAVENOUS | Status: AC
  Administered 2021-02-07: 160 mg via INTRAVENOUS
  Filled 2021-02-07: qty 8

## 2021-02-07 MED ORDER — SODIUM CHLORIDE 0.9 % IV SOLN
10.0000 mg | Freq: Once | INTRAVENOUS | Status: AC
  Administered 2021-02-07: 10 mg via INTRAVENOUS
  Filled 2021-02-07: qty 10

## 2021-02-07 MED ORDER — SODIUM CHLORIDE 0.9 % IV SOLN
Freq: Once | INTRAVENOUS | Status: AC
Start: 2021-02-07 — End: 2021-02-07

## 2021-02-07 MED ORDER — HEPARIN SOD (PORK) LOCK FLUSH 100 UNIT/ML IV SOLN
500.0000 [IU] | Freq: Once | INTRAVENOUS | Status: AC | PRN
  Administered 2021-02-07: 500 [IU]

## 2021-02-07 NOTE — Patient Instructions (Signed)
Etoposide, VP-16 injection What is this medication? ETOPOSIDE, VP-16 (e toe POE side) is a chemotherapy drug. It is used to treat testicular cancer, lung cancer, and other cancers. This medicine may be used for other purposes; ask your health care provider or pharmacist if you have questions. COMMON BRAND NAME(S): Etopophos, Toposar, VePesid What should I tell my care team before I take this medication? They need to know if you have any of these conditions: infection kidney disease liver disease low blood counts, like low white cell, platelet, or red cell counts an unusual or allergic reaction to etoposide, other medicines, foods, dyes, or preservatives pregnant or trying to get pregnant breast-feeding How should I use this medication? This medicine is for infusion into a vein. It is administered in a hospital or clinic by a specially trained health care professional. Talk to your pediatrician regarding the use of this medicine in children. Special care may be needed. Overdosage: If you think you have taken too much of this medicine contact a poison control center or emergency room at once. NOTE: This medicine is only for you. Do not share this medicine with others. What if I miss a dose? It is important not to miss your dose. Call your doctor or health care professional if you are unable to keep an appointment. What may interact with this medication? This medicine may interact with the following medications: warfarin This list may not describe all possible interactions. Give your health care provider a list of all the medicines, herbs, non-prescription drugs, or dietary supplements you use. Also tell them if you smoke, drink alcohol, or use illegal drugs. Some items may interact with your medicine. What should I watch for while using this medication? Visit your doctor for checks on your progress. This drug may make you feel generally unwell. This is not uncommon, as chemotherapy can affect  healthy cells as well as cancer cells. Report any side effects. Continue your course of treatment even though you feel ill unless your doctor tells you to stop. In some cases, you may be given additional medicines to help with side effects. Follow all directions for their use. Call your doctor or health care professional for advice if you get a fever, chills or sore throat, or other symptoms of a cold or flu. Do not treat yourself. This drug decreases your body's ability to fight infections. Try to avoid being around people who are sick. This medicine may increase your risk to bruise or bleed. Call your doctor or health care professional if you notice any unusual bleeding. Talk to your doctor about your risk of cancer. You may be more at risk for certain types of cancers if you take this medicine. Do not become pregnant while taking this medicine or for at least 6 months after stopping it. Women should inform their doctor if they wish to become pregnant or think they might be pregnant. Women of child-bearing potential will need to have a negative pregnancy test before starting this medicine. There is a potential for serious side effects to an unborn child. Talk to your health care professional or pharmacist for more information. Do not breast-feed an infant while taking this medicine. Men must use a latex condom during sexual contact with a woman while taking this medicine and for at least 4 months after stopping it. A latex condom is needed even if you have had a vasectomy. Contact your doctor right away if your partner becomes pregnant. Do not donate sperm while taking this  medicine and for at least 4 months after you stop taking this medicine. Men should inform their doctors if they wish to father a child. This medicine may lower sperm counts. What side effects may I notice from receiving this medication? Side effects that you should report to your doctor or health care professional as soon as  possible: allergic reactions like skin rash, itching or hives, swelling of the face, lips, or tongue low blood counts - this medicine may decrease the number of white blood cells, red blood cells, and platelets. You may be at increased risk for infections and bleeding nausea, vomiting redness, blistering, peeling or loosening of the skin, including inside the mouth signs and symptoms of infection like fever; chills; cough; sore throat; pain or trouble passing urine signs and symptoms of low red blood cells or anemia such as unusually weak or tired; feeling faint or lightheaded; falls; breathing problems unusual bruising or bleeding Side effects that usually do not require medical attention (report to your doctor or health care professional if they continue or are bothersome): changes in taste diarrhea hair loss loss of appetite mouth sores This list may not describe all possible side effects. Call your doctor for medical advice about side effects. You may report side effects to FDA at 1-800-FDA-1088. Where should I keep my medication? This drug is given in a hospital or clinic and will not be stored at home. NOTE: This sheet is a summary. It may not cover all possible information. If you have questions about this medicine, talk to your doctor, pharmacist, or health care provider.  2022 [employer] Standard (2020-11-28 00:00:00)

## 2021-02-09 ENCOUNTER — Inpatient Hospital Stay: Payer: Medicare Other

## 2021-02-09 ENCOUNTER — Other Ambulatory Visit: Payer: Self-pay

## 2021-02-09 VITALS — BP 139/59 | HR 98 | Temp 98.0°F | Resp 18 | Ht 63.0 in | Wt 228.0 lb

## 2021-02-09 DIAGNOSIS — Z7901 Long term (current) use of anticoagulants: Secondary | ICD-10-CM | POA: Diagnosis not present

## 2021-02-09 DIAGNOSIS — E871 Hypo-osmolality and hyponatremia: Secondary | ICD-10-CM | POA: Diagnosis not present

## 2021-02-09 DIAGNOSIS — C7951 Secondary malignant neoplasm of bone: Secondary | ICD-10-CM | POA: Diagnosis not present

## 2021-02-09 DIAGNOSIS — Z79899 Other long term (current) drug therapy: Secondary | ICD-10-CM | POA: Diagnosis not present

## 2021-02-09 DIAGNOSIS — C3432 Malignant neoplasm of lower lobe, left bronchus or lung: Secondary | ICD-10-CM

## 2021-02-09 DIAGNOSIS — Z5189 Encounter for other specified aftercare: Secondary | ICD-10-CM | POA: Diagnosis not present

## 2021-02-09 DIAGNOSIS — Z5112 Encounter for antineoplastic immunotherapy: Secondary | ICD-10-CM | POA: Diagnosis not present

## 2021-02-09 DIAGNOSIS — E222 Syndrome of inappropriate secretion of antidiuretic hormone: Secondary | ICD-10-CM

## 2021-02-09 DIAGNOSIS — C3411 Malignant neoplasm of upper lobe, right bronchus or lung: Secondary | ICD-10-CM | POA: Diagnosis not present

## 2021-02-09 DIAGNOSIS — Z5111 Encounter for antineoplastic chemotherapy: Secondary | ICD-10-CM | POA: Diagnosis not present

## 2021-02-09 MED ORDER — PEGFILGRASTIM-BMEZ 6 MG/0.6ML ~~LOC~~ SOSY
6.0000 mg | PREFILLED_SYRINGE | Freq: Once | SUBCUTANEOUS | Status: AC
  Administered 2021-02-09: 6 mg via SUBCUTANEOUS
  Filled 2021-02-09: qty 0.6

## 2021-02-09 NOTE — Patient Instructions (Signed)

## 2021-02-12 ENCOUNTER — Telehealth: Payer: Self-pay | Admitting: Hematology and Oncology

## 2021-02-12 NOTE — Telephone Encounter (Signed)
Per 11/11 LOS, adjusted patient schedule - Scheduled Dec Appt's.  Per Note holding off on Zometa due to Low Blood Counts.  Requesting patient be given Updated Appt Summary

## 2021-02-12 NOTE — Progress Notes (Signed)
Sent in request for DOS 12/02, to [employer].

## 2021-02-14 NOTE — Progress Notes (Signed)
[facility]  [address] [place],  [place]  [address] [phone]  Clinic Day:  02/23/2021  Referring physician: Helen Hashimoto., MD  This document serves as a record of services personally performed by Hosie Poisson, MD. It was created on their behalf by [facility], a trained medical scribe. The creation of this record is based on the scribe's personal observations and the provider's statements to them.  ASSESSMENT & PLAN:   Assessment & Plan: Small cell carcinoma of lower lobe of left lung (HCC) Small cell carcinoma of the lung, at least a clinical stage IIIB, diagnosed in October 2021.   She was initially treated with chemotherapy with carboplatin/etoposide with a good response.  She was then switched to nivolumab. However, she was found to have recent progression on disease in August 2022.  She underwent palliative radiation therapy, which she completed in August. She is now receiving palliative chemotherapy consisting of carboplatin/etoposide/atezolizumab.  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 She was placed on zoledronic acid in March 2022.  She developed severe hypocalcemia with her 1st dose and we have not resumed this yet.  Secondary malignant neoplasm of the liver She has now resumed palliative chemotherapy/immunotherapy consisting of carboplatin/etoposide/atezolizumab and had her 1st cycle beginning on October 3rd.   Non small cell lung cancer, right, 2010 Treated with chemoradiation  SIADH (syndrome of inappropriate ADH production) (HCC) Her sodium is normal on salt tablets twice daily. I advised that she try decreasing this to once daily.  Hypothyroidism Post thyroidectomy hypothyroidism, currently on levothyroxine 100 mcg daily. She states she is taking this  dose before breakfast without food or other medications.   Hypocalcemia She continues oral calcium supplement three times daily.  Anemia Mildly worse. At this time, we will hold off on transfusion as she does not have worsening symptoms.   She will proceed with her 4th and final cycle of carboplatin/etoposide/atezolizumab next week. She knows to continue oral calcium supplement TID, oral potassium supplement 20 meq BID, and salt tablets once daily. We will plan to see her back on December 28th with a CBC, comprehensive metabolic panel, TSH, T4, CEA and CT chest, abdomen and pelvis prior to initiating maintenance immunotherapy with atezolizumab. She understands and agrees with this plan of care.  I provided 20 minutes of face-to-face time during this this encounter and > 50% was spent counseling as documented under my assessment and plan.    [facility] [address] [place] [address] Dept: [phone] Dept Fax: [phone]   No orders of the defined types were placed in this encounter.    CHIEF COMPLAINT:  CC:   Small cell carcinoma with bone metastasis  Current Treatment:   Palliative  carboplatin/etoposide/atezolizumab   HISTORY OF PRESENT ILLNESS:   Oncology History  Small cell carcinoma of lower lobe of left lung ([place])  01/08/2020 Imaging   CTA CHEST WITH CONTRAST: No evidence of pulmonary embolus.   Tumor encases and narrows the left lower lobe pulmonary artery in the inferior left hilum measuring up to 5.2 cm. This is increased from 1.9 cm previously.   Irregular calcified and noncalcified plaque in the descending thoracic aorta. Coronary artery disease.   Left lower lobe atelectasis.   Aortic Atherosclerosis (ICD10-I70.0) and Emphysema (ICD10-J43.9).  01/11/2020 Pathology Results   LEFT LOWER LOBE LUNG MASS, BIOPSY:               SMALL CELL CARCINOMA WITH CRUSH ARTIFACT   01/20/2020  Initial Diagnosis   Lung cancer, lower lobe ([place])   01/21/2020 Cancer Staging   Staging form: Lung, AJCC 8th Edition - Pathologic stage from 01/21/2020: Stage IIIB (pT3, pN2, cM0) - Signed by Derwood Kaplan, MD on 01/21/2020    01/31/2020 - 04/28/2020 Chemotherapy   Carboplatin/etoposide        06/08/2020 - 10/13/2020 Chemotherapy   Nivolumab        08/28/2020 Imaging   CT CHEST, ABDOMEN AND PELVIS WITHOUT CONTRAST: 1. Progressive soft tissue thickening between the aorta and the left mainstem bronchus suspicious for recurrent tumor. PET-CT may be helpful for further evaluation if clinically indicated. 2. Stable radiation changes involving the right hilum and right lower lobe. 3. Stable severe emphysematous changes and pulmonary scarring. 4. Stable right hepatic lobe lesion. 5. Stable scattered sclerotic bone lesions. 6. Stable advanced atherosclerotic calcifications involving the thoracic and abdominal aorta and branch vessels including the coronary arteries. 7. Chronic bilateral hip AVN. 8. Emphysema and aortic atherosclerosis.   10/15/2020 Imaging   CTA CHEST WITH CONTRAST: 1. No evidence of a pulmonary embolism. 2. No acute findings. 3. Soft tissue mass adjacent to the left peripheral mainstem bronchus has increased in size from the prior CT concerning for recurrence or progression of carcinoma. 4. No other change. 5. Advanced emphysema, coronary artery calcifications and aortic atherosclerosis.   12/22/2020 Imaging   CT CHEST, ABDOMEN AND PELVIS WITH CONTRAST: 1. Interval mixed response to therapy. 2. There has been interval resolution of left hilar adenopathy. Masslike architectural distortion within the superior segment of right lower lobe is slightly decreased in size in the interval. 3. Interval enlargement of subcarinal lymph node concerning for metastatic adenopathy. 4. There are several new, low-attenuation lesions within both lobes of liver.  Worrisome for metastatic disease. 5. Stable appearance of multifocal sclerotic bone metastases. 6. Aortic Atherosclerosis (ICD10-I70.0) and Emphysema (ICD10-J43.9).   12/25/2020 -  Chemotherapy   Patient is on Treatment Plan : LUNG SCLC Carboplatin + Etoposide + Atezolizumab Induction q21d / Atezolizumab Maintenance q21d     Secondary malignant neoplasm of bone and bone marrow ([place])  05/29/2020 Imaging   CT CHEST, ABDOMEN AND PELVIS WITH CONTRAST: 1. New osseous metastatic disease. 2. Post radiation changes in the right hemithorax, stable. 3. Aortic atherosclerosis (ICD10-I70.0). Coronary artery calcification. 4. Emphysema (ICD10-J43.9).      INTERVAL HISTORY:  Gabrielle Hudson is here for routine follow up prior to a 4th cycle of carboplatin/etoposide/atezolizumab. She states that she is doing well, and her shortness of breath remains stable. She continues to use her motorized wheelchair to get around. Blood counts and chemistries are unremarkable except for a hemoglobin of 8.2, previously 8.5. Her  appetite is good, and she has lost 2 pounds since her last visit.  She denies fever, chills or other signs of infection.  She denies nausea, vomiting, bowel issues, or abdominal pain.  She denies sore throat, cough, dyspnea, or chest pain.  REVIEW OF SYSTEMS:  Review of Systems  Constitutional: Negative.  Negative for appetite change, chills, fatigue, fever and unexpected weight change.  HENT:  Negative.    Eyes: Negative.   Respiratory:  Positive for shortness of breath (chronic, on supplemental oxygen). Negative for chest tightness, cough, hemoptysis and wheezing.   Cardiovascular: Negative.  Negative for chest pain, leg  swelling and palpitations.  Gastrointestinal: Negative.  Negative for abdominal distention, abdominal pain, blood in stool, constipation, diarrhea, nausea and vomiting.  Endocrine: Negative.   Genitourinary:  Negative for difficulty urinating, dysuria, frequency and hematuria.    Musculoskeletal:  Positive for gait problem (uses a motorized wheelchair). Negative for arthralgias, back pain, flank pain and myalgias.  Skin: Negative.   Neurological:  Positive for gait problem (uses a motorized wheelchair). Negative for dizziness, extremity weakness, headaches, light-headedness, numbness, seizures and speech difficulty.  Hematological: Negative.   Psychiatric/Behavioral: Negative.  Negative for depression and sleep disturbance. The patient is not nervous/anxious.     VITALS:  Blood pressure (!) 154/71, pulse (!) 111, temperature 98.5 F (36.9 C), temperature source Oral, resp. rate 14, height 5\' 3"  (1.6 m), weight 220 lb 8 oz (100 kg), SpO2 100 %.  Wt Readings from Last 3 Encounters:  02/23/21 220 lb 8 oz (100 kg)  02/09/21 228 lb (103.4 kg)  02/06/21 223 lb (101.2 kg)    Body mass index is 39.06 kg/m.  Performance status (ECOG): 2 - Symptomatic, <50% confined to bed  PHYSICAL EXAM:  Physical Exam Constitutional:      General: She is not in acute distress.    Appearance: Normal appearance. She is normal weight.  HENT:     Head: Normocephalic and atraumatic.  Eyes:     General: No scleral icterus.    Extraocular Movements: Extraocular movements intact.     Conjunctiva/sclera: Conjunctivae normal.     Pupils: Pupils are equal, round, and reactive to light.  Cardiovascular:     Rate and Rhythm: Regular rhythm. Tachycardia present.     Pulses: Normal pulses.     Heart sounds: Normal heart sounds. No murmur heard.   No friction rub. No gallop.  Pulmonary:     Effort: Pulmonary effort is normal. No respiratory distress.     Breath sounds: Normal breath sounds.  Abdominal:     General: Bowel sounds are normal. There is no distension.     Palpations: Abdomen is soft. There is no hepatomegaly, splenomegaly or mass.     Tenderness: There is no abdominal tenderness.  Musculoskeletal:        General: Normal range of motion.     Cervical back: Normal range of  motion and neck supple.     Right lower leg: No edema.     Left lower leg: No edema.  Lymphadenopathy:     Cervical: No cervical adenopathy.  Skin:    General: Skin is warm and dry.  Neurological:     General: No focal deficit present.     Mental Status: She is alert and oriented to person, place, and time. Mental status is at baseline.  Psychiatric:        Mood and Affect: Mood normal.        Behavior: Behavior normal.        Thought Content: Thought content normal.        Judgment: Judgment normal.   LABS:   CBC Latest Ref Rng & Units 02/23/2021 02/02/2021 01/11/2021  WBC - 9.3 8.5 4.1  Hemoglobin 12.0 - 16.0 8.2(A) 8.5(A) 9.9(A)  Hematocrit 36 - 46 25(A) 25(A) 30(A)  Platelets 150 - 399 291 213 133(A)   CMP Latest Ref Rng & Units 02/23/2021 02/02/2021 01/11/2021  Glucose 70 - 99 mg/dL - - -  BUN 4 - 21 11 14 17   Creatinine 0.5 - 1.1 0.9 0.7 0.9  Sodium 137 - 147 138  136(A) 139  Potassium 3.4 - 5.3 3.9 4.3 3.8  Chloride 99 - 108 103 102 101  CO2 13 - 22 27(A) 26(A) 29(A)  Calcium 8.7 - 10.7 9.4 8.5(A) 8.2(A)  Total Protein 6.5 - 8.1 g/dL - - -  Total Bilirubin 0.3 - 1.2 mg/dL - - -  Alkaline Phos 25 - 125 111 91 102  AST 13 - 35 28 22 41(A)  ALT 7 - 35 26 19 36(A)     Lab Results  Component Value Date   CEA1 6.5 (H) 02/02/2021   /  CEA  Date Value Ref Range Status  02/02/2021 6.5 (H) 0.0 - 4.7 ng/mL Final    Comment:    (NOTE)                             Nonsmokers          <3.9                             Smokers             <5.6 [employer] Electrochemiluminescence Immunoassay (ECLIA) Values obtained with different assay methods or kits cannot be used interchangeably.  Results cannot be interpreted as absolute evidence of the presence or absence of malignant disease. Performed At: [facility] [place], [place] [id number] Rush Farmer MD LJ:[id number]     Lab Results  Component Value Date   TOTALPROTELP 4.7 (L)  03/26/2011   ALBUMINELP 52.5 (L) 03/26/2011   A1GS 8.7 (H) 03/26/2011   A2GS 18.8 (H) 03/26/2011   BETS 4.5 (L) 03/26/2011   BETA2SER 4.7 03/26/2011   GAMS 10.8 (L) 03/26/2011   MSPIKE NOT DETECTED 03/26/2011   SPEI (NOTE) 03/26/2011   Lab Results  Component Value Date   TIBC 226 04/19/2020   TIBC 219 (L) 03/29/2011   FERRITIN 361 04/19/2020   FERRITIN 356 (H) 03/29/2011   IRONPCTSAT 44.2 04/19/2020   IRONPCTSAT 65 (H) 03/29/2011   Lab Results  Component Value Date   LDH 349 (H) 03/26/2011    STUDIES:  No results found.    HISTORY:   Allergies:  Allergies  Allergen Reactions   Nitroglycerin In D5w Other (See Comments)    "flatlines"   Nitroglycerin Other (See Comments)    Drops her BP 'flatlines'     Current Medications: Current Outpatient Medications  Medication Sig Dispense Refill   albuterol (PROVENTIL) (5 MG/ML) 0.5% nebulizer solution Take 2.5 mg by nebulization every 6 (six) hours as needed for wheezing or shortness of breath.     albuterol (VENTOLIN HFA) 108 (90 Base) MCG/ACT inhaler Inhale into the lungs every 6 (six) hours as needed for wheezing or shortness of breath.     budesonide-formoterol (SYMBICORT) 160-4.5 MCG/ACT inhaler Inhale 2 puffs into the lungs 2 (two) times daily.       calcium citrate-vitamin D (CITRACAL+D) 315-200 MG-UNIT tablet Take 1 tablet by mouth in the morning, at noon, and at bedtime.     Cholecalciferol (VITAMIN D3) 1.25 MG (50000 UT) CAPS Take 1.25 mg by mouth once a week. 12 capsule 3   cyclobenzaprine (FLEXERIL) 10 MG tablet Take 1 tablet (10 mg total) by mouth 3 (three) times daily as needed for muscle spasms. 30 tablet 3   diltiazem (CARDIZEM CD) 120 MG 24 hr capsule Take 120 mg by mouth daily.     ELIQUIS 5  MG TABS tablet Take 1 tablet by mouth twice daily 60 tablet 2   famotidine (PEPCID) 20 MG tablet Take 20 mg by mouth 2 (two) times daily.     furosemide (LASIX) 20 MG tablet Take 20 mg by mouth as needed. ONLY TAKES IT  HAVING SWELLING, can take 2 tablets it more than a 3 lb weight gain ever night     gabapentin (NEURONTIN) 300 MG capsule Take 1 capsule (300 mg total) by mouth 2 (two) times daily. 60 capsule 4   HYDROcodone-acetaminophen (NORCO/VICODIN) 5-325 MG tablet Take 1 tablet by mouth every 4 (four) hours as needed for moderate pain. 30 tablet 0   levothyroxine (SYNTHROID) 100 MCG tablet Take 1 tablet (100 mcg total) by mouth daily before breakfast. 30 tablet 5   loratadine (CLARITIN) 10 MG tablet Take 10 mg by mouth daily.     meclizine (ANTIVERT) 25 MG tablet Take 0.5-1 tablets (12.5-25 mg total) by mouth 3 (three) times daily as needed for dizziness. 120 tablet 3   OXYGEN Inhale 3 L into the lungs continuous.     Potassium Chloride ER 20 MEQ TBCR Take 1 tablet by mouth 2 (two) times daily. 60 tablet 5   rOPINIRole (REQUIP) 0.5 MG tablet Take 0.5 mg by mouth at bedtime.     sodium chloride 1 g tablet Take 1 g by mouth daily.     No current facility-administered medications for this visit.     I, Rita Ohara, am acting as scribe for Derwood Kaplan, MD  I have reviewed this report as typed by the medical scribe, and it is complete and accurate.

## 2021-02-19 NOTE — Progress Notes (Signed)
Sent in request for DOS 12/05, 12/06, 12/07, and 12/09 to [employer].

## 2021-02-23 ENCOUNTER — Encounter: Payer: Self-pay | Admitting: Oncology

## 2021-02-23 ENCOUNTER — Inpatient Hospital Stay: Payer: Medicare Other | Attending: Hematology and Oncology | Admitting: Oncology

## 2021-02-23 ENCOUNTER — Other Ambulatory Visit: Payer: Self-pay | Admitting: Pharmacist

## 2021-02-23 ENCOUNTER — Inpatient Hospital Stay: Payer: Medicare Other

## 2021-02-23 ENCOUNTER — Other Ambulatory Visit: Payer: Self-pay | Admitting: Oncology

## 2021-02-23 VITALS — BP 154/71 | HR 111 | Temp 98.5°F | Resp 14 | Ht 63.0 in | Wt 220.5 lb

## 2021-02-23 DIAGNOSIS — Z5111 Encounter for antineoplastic chemotherapy: Secondary | ICD-10-CM | POA: Insufficient documentation

## 2021-02-23 DIAGNOSIS — C787 Secondary malignant neoplasm of liver and intrahepatic bile duct: Secondary | ICD-10-CM | POA: Diagnosis not present

## 2021-02-23 DIAGNOSIS — E222 Syndrome of inappropriate secretion of antidiuretic hormone: Secondary | ICD-10-CM | POA: Diagnosis not present

## 2021-02-23 DIAGNOSIS — C7951 Secondary malignant neoplasm of bone: Secondary | ICD-10-CM | POA: Diagnosis not present

## 2021-02-23 DIAGNOSIS — Z86711 Personal history of pulmonary embolism: Secondary | ICD-10-CM | POA: Diagnosis not present

## 2021-02-23 DIAGNOSIS — C3432 Malignant neoplasm of lower lobe, left bronchus or lung: Secondary | ICD-10-CM

## 2021-02-23 DIAGNOSIS — C7952 Secondary malignant neoplasm of bone marrow: Secondary | ICD-10-CM | POA: Diagnosis not present

## 2021-02-23 DIAGNOSIS — E871 Hypo-osmolality and hyponatremia: Secondary | ICD-10-CM

## 2021-02-23 DIAGNOSIS — C3490 Malignant neoplasm of unspecified part of unspecified bronchus or lung: Secondary | ICD-10-CM

## 2021-02-23 DIAGNOSIS — Z5112 Encounter for antineoplastic immunotherapy: Secondary | ICD-10-CM | POA: Insufficient documentation

## 2021-02-23 DIAGNOSIS — D649 Anemia, unspecified: Secondary | ICD-10-CM | POA: Diagnosis not present

## 2021-02-23 DIAGNOSIS — E890 Postprocedural hypothyroidism: Secondary | ICD-10-CM | POA: Diagnosis not present

## 2021-02-23 DIAGNOSIS — C3411 Malignant neoplasm of upper lobe, right bronchus or lung: Secondary | ICD-10-CM | POA: Insufficient documentation

## 2021-02-23 DIAGNOSIS — Z7901 Long term (current) use of anticoagulants: Secondary | ICD-10-CM | POA: Diagnosis not present

## 2021-02-23 DIAGNOSIS — Z79899 Other long term (current) drug therapy: Secondary | ICD-10-CM | POA: Diagnosis not present

## 2021-02-23 LAB — CBC AND DIFFERENTIAL
HCT: 25 — AB (ref 36–46)
Hemoglobin: 8.2 — AB (ref 12.0–16.0)
Neutrophils Absolute: 7.16
Platelets: 291 (ref 150–399)
WBC: 9.3

## 2021-02-23 LAB — BASIC METABOLIC PANEL
BUN: 11 (ref 4–21)
CO2: 27 — AB (ref 13–22)
Chloride: 103 (ref 99–108)
Creatinine: 0.9 (ref 0.5–1.1)
Glucose: 120
Potassium: 3.9 (ref 3.4–5.3)
Sodium: 138 (ref 137–147)

## 2021-02-23 LAB — HEPATIC FUNCTION PANEL
ALT: 26 (ref 7–35)
AST: 28 (ref 13–35)
Alkaline Phosphatase: 111 (ref 25–125)
Bilirubin, Total: 0.6

## 2021-02-23 LAB — CBC: RBC: 2.77 — AB (ref 3.87–5.11)

## 2021-02-23 LAB — COMPREHENSIVE METABOLIC PANEL
Albumin: 4 (ref 3.5–5.0)
Calcium: 9.4 (ref 8.7–10.7)

## 2021-02-23 LAB — TSH: TSH: 4.943 u[IU]/mL — ABNORMAL HIGH (ref 0.350–4.500)

## 2021-02-23 MED FILL — Fosaprepitant Dimeglumine For IV Infusion 150 MG (Base Eq): INTRAVENOUS | Qty: 5 | Status: AC

## 2021-02-23 MED FILL — Dexamethasone Sodium Phosphate Inj 100 MG/10ML: INTRAMUSCULAR | Qty: 1 | Status: AC

## 2021-02-23 MED FILL — Etoposide Inj 1 GM/50ML (20 MG/ML): INTRAVENOUS | Qty: 8 | Status: AC

## 2021-02-23 MED FILL — Carboplatin IV Soln 450 MG/45ML: INTRAVENOUS | Qty: 36 | Status: AC

## 2021-02-23 MED FILL — Atezolizumab IV Soln 1200 MG/20ML: INTRAVENOUS | Qty: 20 | Status: AC

## 2021-02-23 MED FILL — Famotidine in NaCl 0.9% IV Soln 20 MG/50ML: INTRAVENOUS | Qty: 100 | Status: AC

## 2021-02-26 ENCOUNTER — Other Ambulatory Visit: Payer: Self-pay

## 2021-02-26 ENCOUNTER — Inpatient Hospital Stay: Payer: Medicare Other

## 2021-02-26 VITALS — BP 156/71 | HR 100 | Temp 97.9°F | Resp 16 | Ht 63.0 in | Wt 220.1 lb

## 2021-02-26 DIAGNOSIS — Z5111 Encounter for antineoplastic chemotherapy: Secondary | ICD-10-CM | POA: Diagnosis not present

## 2021-02-26 DIAGNOSIS — C3432 Malignant neoplasm of lower lobe, left bronchus or lung: Secondary | ICD-10-CM

## 2021-02-26 DIAGNOSIS — C3411 Malignant neoplasm of upper lobe, right bronchus or lung: Secondary | ICD-10-CM | POA: Diagnosis not present

## 2021-02-26 DIAGNOSIS — E890 Postprocedural hypothyroidism: Secondary | ICD-10-CM | POA: Diagnosis not present

## 2021-02-26 DIAGNOSIS — Z79899 Other long term (current) drug therapy: Secondary | ICD-10-CM | POA: Diagnosis not present

## 2021-02-26 DIAGNOSIS — Z7901 Long term (current) use of anticoagulants: Secondary | ICD-10-CM | POA: Diagnosis not present

## 2021-02-26 DIAGNOSIS — C7951 Secondary malignant neoplasm of bone: Secondary | ICD-10-CM | POA: Diagnosis not present

## 2021-02-26 DIAGNOSIS — E871 Hypo-osmolality and hyponatremia: Secondary | ICD-10-CM

## 2021-02-26 DIAGNOSIS — E222 Syndrome of inappropriate secretion of antidiuretic hormone: Secondary | ICD-10-CM

## 2021-02-26 DIAGNOSIS — C787 Secondary malignant neoplasm of liver and intrahepatic bile duct: Secondary | ICD-10-CM | POA: Diagnosis not present

## 2021-02-26 DIAGNOSIS — D649 Anemia, unspecified: Secondary | ICD-10-CM | POA: Diagnosis not present

## 2021-02-26 DIAGNOSIS — Z86711 Personal history of pulmonary embolism: Secondary | ICD-10-CM | POA: Diagnosis not present

## 2021-02-26 DIAGNOSIS — Z5112 Encounter for antineoplastic immunotherapy: Secondary | ICD-10-CM | POA: Diagnosis not present

## 2021-02-26 LAB — T4: T4, Total: 9.1 ug/dL (ref 4.5–12.0)

## 2021-02-26 MED ORDER — SODIUM CHLORIDE 0.9 % IV SOLN
1200.0000 mg | Freq: Once | INTRAVENOUS | Status: AC
  Administered 2021-02-26: 1200 mg via INTRAVENOUS
  Filled 2021-02-26: qty 20

## 2021-02-26 MED ORDER — SODIUM CHLORIDE 0.9 % IV SOLN
364.4500 mg | Freq: Once | INTRAVENOUS | Status: AC
  Administered 2021-02-26: 360 mg via INTRAVENOUS
  Filled 2021-02-26: qty 36

## 2021-02-26 MED ORDER — FAMOTIDINE 20 MG IN NS 100 ML IVPB
20.0000 mg | Freq: Once | INTRAVENOUS | Status: AC
  Administered 2021-02-26: 20 mg via INTRAVENOUS
  Filled 2021-02-26: qty 20

## 2021-02-26 MED ORDER — SODIUM CHLORIDE 0.9 % IV SOLN
75.0000 mg/m2 | Freq: Once | INTRAVENOUS | Status: AC
  Administered 2021-02-26: 160 mg via INTRAVENOUS
  Filled 2021-02-26: qty 8

## 2021-02-26 MED ORDER — SODIUM CHLORIDE 0.9 % IV SOLN
10.0000 mg | Freq: Once | INTRAVENOUS | Status: AC
  Administered 2021-02-26: 10 mg via INTRAVENOUS
  Filled 2021-02-26: qty 10

## 2021-02-26 MED ORDER — SODIUM CHLORIDE 0.9% FLUSH: 10.0000 mL | INTRAVENOUS | Status: DC | PRN

## 2021-02-26 MED ORDER — SODIUM CHLORIDE 0.9 % IV SOLN
150.0000 mg | Freq: Once | INTRAVENOUS | Status: AC
  Administered 2021-02-26: 150 mg via INTRAVENOUS
  Filled 2021-02-26: qty 150

## 2021-02-26 MED ORDER — DIPHENHYDRAMINE HCL 50 MG/ML IJ SOLN
50.0000 mg | Freq: Once | INTRAMUSCULAR | Status: AC
  Administered 2021-02-26: 50 mg via INTRAVENOUS
  Filled 2021-02-26: qty 1

## 2021-02-26 MED ORDER — SODIUM CHLORIDE 0.9 % IV SOLN: Freq: Once | INTRAVENOUS | Status: AC

## 2021-02-26 MED ORDER — HEPARIN SOD (PORK) LOCK FLUSH 100 UNIT/ML IV SOLN: 500.0000 [IU] | Freq: Once | INTRAVENOUS | Status: DC | PRN

## 2021-02-26 MED ORDER — ZOLEDRONIC ACID 4 MG/100ML IV SOLN
4.0000 mg | Freq: Once | INTRAVENOUS | Status: AC
  Administered 2021-02-26: 4 mg via INTRAVENOUS
  Filled 2021-02-26: qty 100

## 2021-02-26 MED ORDER — PALONOSETRON HCL INJECTION 0.25 MG/5ML
0.2500 mg | Freq: Once | INTRAVENOUS | Status: AC
  Administered 2021-02-26: 0.25 mg via INTRAVENOUS
  Filled 2021-02-26: qty 5

## 2021-02-26 MED FILL — Etoposide Inj 1 GM/50ML (20 MG/ML): INTRAVENOUS | Qty: 8 | Status: AC

## 2021-02-26 MED FILL — Dexamethasone Sodium Phosphate Inj 100 MG/10ML: INTRAMUSCULAR | Qty: 1 | Status: AC

## 2021-02-26 NOTE — Patient Instructions (Signed)
Atezolizumab injection What is this medication? ATEZOLIZUMAB (a te zoe LIZ ue mab) is a monoclonal antibody. It is used to treat bladder cancer (urothelial cancer), liver cancer, lung cancer, and melanoma. This medicine may be used for other purposes; ask your health care provider or pharmacist if you have questions. COMMON BRAND NAME(S): Tecentriq What should I tell my care team before I take this medication? They need to know if you have any of these conditions: autoimmune diseases like Crohn's disease, ulcerative colitis, or lupus have had or planning to have an allogeneic stem cell transplant (uses someone else's stem cells) history of organ transplant history of radiation to the chest nervous system problems like myasthenia gravis or Guillain-Barre syndrome an unusual or allergic reaction to atezolizumab, other medicines, foods, dyes, or preservatives pregnant or trying to get pregnant breast-feeding How should I use this medication? This medicine is for infusion into a vein. It is given by a health care professional in a hospital or clinic setting. A special MedGuide will be given to you before each treatment. Be sure to read this information carefully each time. Talk to your pediatrician regarding the use of this medicine in children. Special care may be needed. Overdosage: If you think you have taken too much of this medicine contact a poison control center or emergency room at once. NOTE: This medicine is only for you. Do not share this medicine with others. What if I miss a dose? It is important not to miss your dose. Call your doctor or health care professional if you are unable to keep an appointment. What may interact with this medication? Interactions have not been studied. This list may not describe all possible interactions. Give your health care provider a list of all the medicines, herbs, non-prescription drugs, or dietary supplements you use. Also tell them if you smoke,  drink alcohol, or use illegal drugs. Some items may interact with your medicine. What should I watch for while using this medication? Your condition will be monitored carefully while you are receiving this medicine. You may need blood work done while you are taking this medicine. Do not become pregnant while taking this medicine or for at least 5 months after stopping it. Women should inform their doctor if they wish to become pregnant or think they might be pregnant. There is a potential for serious side effects to an unborn child. Talk to your health care professional or pharmacist for more information. Do not breast-feed an infant while taking this medicine or for at least 5 months after the last dose. What side effects may I notice from receiving this medication? Side effects that you should report to your doctor or health care professional as soon as possible: allergic reactions like skin rash, itching or hives, swelling of the face, lips, or tongue black, tarry stools bloody or watery diarrhea breathing problems changes in vision chest pain or chest tightness chills facial flushing fever headache signs and symptoms of high blood sugar such as dizziness; dry mouth; dry skin; fruity breath; nausea; stomach pain; increased hunger or thirst; increased urination signs and symptoms of liver injury like dark yellow or brown urine; general ill feeling or flu-like symptoms; light-colored stools; loss of appetite; nausea; right upper belly pain; unusually weak or tired; yellowing of the eyes or skin stomach pain trouble passing urine or change in the amount of urine Side effects that usually do not require medical attention (report to your doctor or health care professional if they continue or are  bothersome): bone pain cough diarrhea joint pain muscle pain muscle weakness swelling of arms or legs tiredness weight loss This list may not describe all possible side effects. Call your doctor  for medical advice about side effects. You may report side effects to FDA at 1-800-FDA-1088. Where should I keep my medication? This drug is given in a hospital or clinic and will not be stored at home. NOTE: This sheet is a summary. It may not cover all possible information. If you have questions about this medicine, talk to your doctor, pharmacist, or health care provider.  2022 Elsevier/Gold Standard (2020-11-28 00:00:00) Carboplatin injection What is this medication? CARBOPLATIN (KAR boe pla tin) is a chemotherapy drug. It targets fast dividing cells, like cancer cells, and causes these cells to die. This medicine is used to treat ovarian cancer and many other cancers. This medicine may be used for other purposes; ask your health care provider or pharmacist if you have questions. COMMON BRAND NAME(S): Paraplatin What should I tell my care team before I take this medication? They need to know if you have any of these conditions: blood disorders hearing problems kidney disease recent or ongoing radiation therapy an unusual or allergic reaction to carboplatin, cisplatin, other chemotherapy, other medicines, foods, dyes, or preservatives pregnant or trying to get pregnant breast-feeding How should I use this medication? This drug is usually given as an infusion into a vein. It is administered in a hospital or clinic by a specially trained health care professional. Talk to your pediatrician regarding the use of this medicine in children. Special care may be needed. Overdosage: If you think you have taken too much of this medicine contact a poison control center or emergency room at once. NOTE: This medicine is only for you. Do not share this medicine with others. What if I miss a dose? It is important not to miss a dose. Call your doctor or health care professional if you are unable to keep an appointment. What may interact with this medication? medicines for seizures medicines to  increase blood counts like filgrastim, pegfilgrastim, sargramostim some antibiotics like amikacin, gentamicin, neomycin, streptomycin, tobramycin vaccines Talk to your doctor or health care professional before taking any of these medicines: acetaminophen aspirin ibuprofen ketoprofen naproxen This list may not describe all possible interactions. Give your health care provider a list of all the medicines, herbs, non-prescription drugs, or dietary supplements you use. Also tell them if you smoke, drink alcohol, or use illegal drugs. Some items may interact with your medicine. What should I watch for while using this medication? Your condition will be monitored carefully while you are receiving this medicine. You will need important blood work done while you are taking this medicine. This drug may make you feel generally unwell. This is not uncommon, as chemotherapy can affect healthy cells as well as cancer cells. Report any side effects. Continue your course of treatment even though you feel ill unless your doctor tells you to stop. In some cases, you may be given additional medicines to help with side effects. Follow all directions for their use. Call your doctor or health care professional for advice if you get a fever, chills or sore throat, or other symptoms of a cold or flu. Do not treat yourself. This drug decreases your body's ability to fight infections. Try to avoid being around people who are sick. This medicine may increase your risk to bruise or bleed. Call your doctor or health care professional if you notice any unusual  bleeding. Be careful brushing and flossing your teeth or using a toothpick because you may get an infection or bleed more easily. If you have any dental work done, tell your dentist you are receiving this medicine. Avoid taking products that contain aspirin, acetaminophen, ibuprofen, naproxen, or ketoprofen unless instructed by your doctor. These medicines may hide a  fever. Do not become pregnant while taking this medicine. Women should inform their doctor if they wish to become pregnant or think they might be pregnant. There is a potential for serious side effects to an unborn child. Talk to your health care professional or pharmacist for more information. Do not breast-feed an infant while taking this medicine. What side effects may I notice from receiving this medication? Side effects that you should report to your doctor or health care professional as soon as possible: allergic reactions like skin rash, itching or hives, swelling of the face, lips, or tongue signs of infection - fever or chills, cough, sore throat, pain or difficulty passing urine signs of decreased platelets or bleeding - bruising, pinpoint red spots on the skin, black, tarry stools, nosebleeds signs of decreased red blood cells - unusually weak or tired, fainting spells, lightheadedness breathing problems changes in hearing changes in vision chest pain high blood pressure low blood counts - This drug may decrease the number of white blood cells, red blood cells and platelets. You may be at increased risk for infections and bleeding. nausea and vomiting pain, swelling, redness or irritation at the injection site pain, tingling, numbness in the hands or feet problems with balance, talking, walking trouble passing urine or change in the amount of urine Side effects that usually do not require medical attention (report to your doctor or health care professional if they continue or are bothersome): hair loss loss of appetite metallic taste in the mouth or changes in taste This list may not describe all possible side effects. Call your doctor for medical advice about side effects. You may report side effects to FDA at 1-800-FDA-1088. Where should I keep my medication? This drug is given in a hospital or clinic and will not be stored at home. NOTE: This sheet is a summary. It may not  cover all possible information. If you have questions about this medicine, talk to your doctor, pharmacist, or health care provider.  2022 Elsevier/Gold Standard (2007-08-19 00:00:00) Etoposide, VP-16 injection What is this medication? ETOPOSIDE, VP-16 (e toe POE side) is a chemotherapy drug. It is used to treat testicular cancer, lung cancer, and other cancers. This medicine may be used for other purposes; ask your health care provider or pharmacist if you have questions. COMMON BRAND NAME(S): Etopophos, Toposar, VePesid What should I tell my care team before I take this medication? They need to know if you have any of these conditions: infection kidney disease liver disease low blood counts, like low white cell, platelet, or red cell counts an unusual or allergic reaction to etoposide, other medicines, foods, dyes, or preservatives pregnant or trying to get pregnant breast-feeding How should I use this medication? This medicine is for infusion into a vein. It is administered in a hospital or clinic by a specially trained health care professional. Talk to your pediatrician regarding the use of this medicine in children. Special care may be needed. Overdosage: If you think you have taken too much of this medicine contact a poison control center or emergency room at once. NOTE: This medicine is only for you. Do not share this medicine with  others. What if I miss a dose? It is important not to miss your dose. Call your doctor or health care professional if you are unable to keep an appointment. What may interact with this medication? This medicine may interact with the following medications: warfarin This list may not describe all possible interactions. Give your health care provider a list of all the medicines, herbs, non-prescription drugs, or dietary supplements you use. Also tell them if you smoke, drink alcohol, or use illegal drugs. Some items may interact with your medicine. What  should I watch for while using this medication? Visit your doctor for checks on your progress. This drug may make you feel generally unwell. This is not uncommon, as chemotherapy can affect healthy cells as well as cancer cells. Report any side effects. Continue your course of treatment even though you feel ill unless your doctor tells you to stop. In some cases, you may be given additional medicines to help with side effects. Follow all directions for their use. Call your doctor or health care professional for advice if you get a fever, chills or sore throat, or other symptoms of a cold or flu. Do not treat yourself. This drug decreases your body's ability to fight infections. Try to avoid being around people who are sick. This medicine may increase your risk to bruise or bleed. Call your doctor or health care professional if you notice any unusual bleeding. Talk to your doctor about your risk of cancer. You may be more at risk for certain types of cancers if you take this medicine. Do not become pregnant while taking this medicine or for at least 6 months after stopping it. Women should inform their doctor if they wish to become pregnant or think they might be pregnant. Women of child-bearing potential will need to have a negative pregnancy test before starting this medicine. There is a potential for serious side effects to an unborn child. Talk to your health care professional or pharmacist for more information. Do not breast-feed an infant while taking this medicine. Men must use a latex condom during sexual contact with a woman while taking this medicine and for at least 4 months after stopping it. A latex condom is needed even if you have had a vasectomy. Contact your doctor right away if your partner becomes pregnant. Do not donate sperm while taking this medicine and for at least 4 months after you stop taking this medicine. Men should inform their doctors if they wish to father a child. This medicine  may lower sperm counts. What side effects may I notice from receiving this medication? Side effects that you should report to your doctor or health care professional as soon as possible: allergic reactions like skin rash, itching or hives, swelling of the face, lips, or tongue low blood counts - this medicine may decrease the number of white blood cells, red blood cells, and platelets. You may be at increased risk for infections and bleeding nausea, vomiting redness, blistering, peeling or loosening of the skin, including inside the mouth signs and symptoms of infection like fever; chills; cough; sore throat; pain or trouble passing urine signs and symptoms of low red blood cells or anemia such as unusually weak or tired; feeling faint or lightheaded; falls; breathing problems unusual bruising or bleeding Side effects that usually do not require medical attention (report to your doctor or health care professional if they continue or are bothersome): changes in taste diarrhea hair loss loss of appetite mouth sores This  list may not describe all possible side effects. Call your doctor for medical advice about side effects. You may report side effects to FDA at 1-800-FDA-1088. Where should I keep my medication? This drug is given in a hospital or clinic and will not be stored at home. NOTE: This sheet is a summary. It may not cover all possible information. If you have questions about this medicine, talk to your doctor, pharmacist, or health care provider.  2022 Elsevier/Gold Standard (2020-11-28 00:00:00) Zoledronic Acid Injection (Hypercalcemia, Oncology) What is this medication? ZOLEDRONIC ACID (ZOE le dron ik AS id) slows calcium loss from bones. It high calcium levels in the blood from some kinds of cancer. It may be used in other people at risk for bone loss. This medicine may be used for other purposes; ask your health care provider or pharmacist if you have questions. COMMON BRAND  NAME(S): Zometa What should I tell my care team before I take this medication? They need to know if you have any of these conditions: cancer dehydration dental disease kidney disease liver disease low levels of calcium in the blood lung or breathing disease (asthma) receiving steroids like dexamethasone or prednisone an unusual or allergic reaction to zoledronic acid, other medicines, foods, dyes, or preservatives pregnant or trying to get pregnant breast-feeding How should I use this medication? This drug is injected into a vein. It is given by a health care provider in a hospital or clinic setting. Talk to your health care provider about the use of this drug in children. Special care may be needed. Overdosage: If you think you have taken too much of this medicine contact a poison control center or emergency room at once. NOTE: This medicine is only for you. Do not share this medicine with others. What if I miss a dose? Keep appointments for follow-up doses. It is important not to miss your dose. Call your health care provider if you are unable to keep an appointment. What may interact with this medication? certain antibiotics given by injection NSAIDs, medicines for pain and inflammation, like ibuprofen or naproxen some diuretics like bumetanide, furosemide teriparatide thalidomide This list may not describe all possible interactions. Give your health care provider a list of all the medicines, herbs, non-prescription drugs, or dietary supplements you use. Also tell them if you smoke, drink alcohol, or use illegal drugs. Some items may interact with your medicine. What should I watch for while using this medication? Visit your health care provider for regular checks on your progress. It may be some time before you see the benefit from this drug. Some people who take this drug have severe bone, joint, or muscle pain. This drug may also increase your risk for jaw problems or a broken  thigh bone. Tell your health care provider right away if you have severe pain in your jaw, bones, joints, or muscles. Tell you health care provider if you have any pain that does not go away or that gets worse. Tell your dentist and dental surgeon that you are taking this drug. You should not have major dental surgery while on this drug. See your dentist to have a dental exam and fix any dental problems before starting this drug. Take good care of your teeth while on this drug. Make sure you see your dentist for regular follow-up appointments. You should make sure you get enough calcium and vitamin D while you are taking this drug. Discuss the foods you eat and the vitamins you take with your  health care provider. Check with your health care provider if you have severe diarrhea, nausea, and vomiting, or if you sweat a lot. The loss of too much body fluid may make it dangerous for you to take this drug. You may need blood work done while you are taking this drug. Do not become pregnant while taking this drug. Women should inform their health care provider if they wish to become pregnant or think they might be pregnant. There is potential for serious harm to an unborn child. Talk to your health care provider for more information. What side effects may I notice from receiving this medication? Side effects that you should report to your doctor or health care provider as soon as possible: allergic reactions (skin rash, itching or hives; swelling of the face, lips, or tongue) bone pain infection (fever, chills, cough, sore throat, pain or trouble passing urine) jaw pain, especially after dental work joint pain kidney injury (trouble passing urine or change in the amount of urine) low blood pressure (dizziness; feeling faint or lightheaded, falls; unusually weak or tired) low calcium levels (fast heartbeat; muscle cramps or pain; pain, tingling, or numbness in the hands or feet; seizures) low magnesium  levels (fast, irregular heartbeat; muscle cramp or pain; muscle weakness; tremors; seizures) low red blood cell counts (trouble breathing; feeling faint; lightheaded, falls; unusually weak or tired) muscle pain redness, blistering, peeling, or loosening of the skin, including inside the mouth severe diarrhea swelling of the ankles, feet, hands trouble breathing Side effects that usually do not require medical attention (report to your doctor or health care provider if they continue or are bothersome): anxious constipation coughing depressed mood eye irritation, itching, or pain fever general ill feeling or flu-like symptoms nausea pain, redness, or irritation at site where injected trouble sleeping This list may not describe all possible side effects. Call your doctor for medical advice about side effects. You may report side effects to FDA at 1-800-FDA-1088. Where should I keep my medication? This drug is given in a hospital or clinic. It will not be stored at home. NOTE: This sheet is a summary. It may not cover all possible information. If you have questions about this medicine, talk to your doctor, pharmacist, or health care provider.  2022 [employer]vier/Gold Standard (2020-11-28 00:00:00)

## 2021-02-27 ENCOUNTER — Ambulatory Visit: Payer: Medicare Other

## 2021-02-27 ENCOUNTER — Inpatient Hospital Stay: Payer: Medicare Other

## 2021-02-27 VITALS — BP 150/66 | HR 90 | Temp 98.9°F | Resp 18

## 2021-02-27 DIAGNOSIS — E890 Postprocedural hypothyroidism: Secondary | ICD-10-CM | POA: Diagnosis not present

## 2021-02-27 DIAGNOSIS — E222 Syndrome of inappropriate secretion of antidiuretic hormone: Secondary | ICD-10-CM | POA: Diagnosis not present

## 2021-02-27 DIAGNOSIS — Z86711 Personal history of pulmonary embolism: Secondary | ICD-10-CM | POA: Diagnosis not present

## 2021-02-27 DIAGNOSIS — D649 Anemia, unspecified: Secondary | ICD-10-CM | POA: Diagnosis not present

## 2021-02-27 DIAGNOSIS — E871 Hypo-osmolality and hyponatremia: Secondary | ICD-10-CM

## 2021-02-27 DIAGNOSIS — Z7901 Long term (current) use of anticoagulants: Secondary | ICD-10-CM | POA: Diagnosis not present

## 2021-02-27 DIAGNOSIS — C7951 Secondary malignant neoplasm of bone: Secondary | ICD-10-CM | POA: Diagnosis not present

## 2021-02-27 DIAGNOSIS — C3432 Malignant neoplasm of lower lobe, left bronchus or lung: Secondary | ICD-10-CM

## 2021-02-27 DIAGNOSIS — Z5112 Encounter for antineoplastic immunotherapy: Secondary | ICD-10-CM | POA: Diagnosis not present

## 2021-02-27 DIAGNOSIS — C3411 Malignant neoplasm of upper lobe, right bronchus or lung: Secondary | ICD-10-CM | POA: Diagnosis not present

## 2021-02-27 DIAGNOSIS — C787 Secondary malignant neoplasm of liver and intrahepatic bile duct: Secondary | ICD-10-CM | POA: Diagnosis not present

## 2021-02-27 DIAGNOSIS — Z5111 Encounter for antineoplastic chemotherapy: Secondary | ICD-10-CM | POA: Diagnosis not present

## 2021-02-27 DIAGNOSIS — Z79899 Other long term (current) drug therapy: Secondary | ICD-10-CM | POA: Diagnosis not present

## 2021-02-27 MED ORDER — SODIUM CHLORIDE 0.9 % IV SOLN
75.0000 mg/m2 | Freq: Once | INTRAVENOUS | Status: AC
  Administered 2021-02-27: 160 mg via INTRAVENOUS
  Filled 2021-02-27: qty 8

## 2021-02-27 MED ORDER — HEPARIN SOD (PORK) LOCK FLUSH 100 UNIT/ML IV SOLN: 500.0000 [IU] | Freq: Once | INTRAVENOUS | Status: DC | PRN

## 2021-02-27 MED ORDER — SODIUM CHLORIDE 0.9 % IV SOLN
Freq: Once | INTRAVENOUS | Status: AC
Start: 2021-02-27 — End: 2021-02-27

## 2021-02-27 MED ORDER — SODIUM CHLORIDE 0.9% FLUSH: 10.0000 mL | INTRAVENOUS | Status: DC | PRN

## 2021-02-27 MED ORDER — SODIUM CHLORIDE 0.9 % IV SOLN
10.0000 mg | Freq: Once | INTRAVENOUS | Status: AC
  Administered 2021-02-27: 10 mg via INTRAVENOUS
  Filled 2021-02-27: qty 10

## 2021-02-27 MED FILL — Dexamethasone Sodium Phosphate Inj 100 MG/10ML: INTRAMUSCULAR | Qty: 1 | Status: AC

## 2021-02-27 MED FILL — Etoposide Inj 1 GM/50ML (20 MG/ML): INTRAVENOUS | Qty: 8 | Status: AC

## 2021-02-27 NOTE — Patient Instructions (Signed)
Etoposide, VP-16 injection What is this medication? ETOPOSIDE, VP-16 (e toe POE side) is a chemotherapy drug. It is used to treat testicular cancer, lung cancer, and other cancers. This medicine may be used for other purposes; ask your health care provider or pharmacist if you have questions. COMMON BRAND NAME(S): Etopophos, Toposar, VePesid What should I tell my care team before I take this medication? They need to know if you have any of these conditions: infection kidney disease liver disease low blood counts, like low white cell, platelet, or red cell counts an unusual or allergic reaction to etoposide, other medicines, foods, dyes, or preservatives pregnant or trying to get pregnant breast-feeding How should I use this medication? This medicine is for infusion into a vein. It is administered in a hospital or clinic by a specially trained health care professional. Talk to your pediatrician regarding the use of this medicine in children. Special care may be needed. Overdosage: If you think you have taken too much of this medicine contact a poison control center or emergency room at once. NOTE: This medicine is only for you. Do not share this medicine with others. What if I miss a dose? It is important not to miss your dose. Call your doctor or health care professional if you are unable to keep an appointment. What may interact with this medication? This medicine may interact with the following medications: warfarin This list may not describe all possible interactions. Give your health care provider a list of all the medicines, herbs, non-prescription drugs, or dietary supplements you use. Also tell them if you smoke, drink alcohol, or use illegal drugs. Some items may interact with your medicine. What should I watch for while using this medication? Visit your doctor for checks on your progress. This drug may make you feel generally unwell. This is not uncommon, as chemotherapy can affect  healthy cells as well as cancer cells. Report any side effects. Continue your course of treatment even though you feel ill unless your doctor tells you to stop. In some cases, you may be given additional medicines to help with side effects. Follow all directions for their use. Call your doctor or health care professional for advice if you get a fever, chills or sore throat, or other symptoms of a cold or flu. Do not treat yourself. This drug decreases your body's ability to fight infections. Try to avoid being around people who are sick. This medicine may increase your risk to bruise or bleed. Call your doctor or health care professional if you notice any unusual bleeding. Talk to your doctor about your risk of cancer. You may be more at risk for certain types of cancers if you take this medicine. Do not become pregnant while taking this medicine or for at least 6 months after stopping it. Women should inform their doctor if they wish to become pregnant or think they might be pregnant. Women of child-bearing potential will need to have a negative pregnancy test before starting this medicine. There is a potential for serious side effects to an unborn child. Talk to your health care professional or pharmacist for more information. Do not breast-feed an infant while taking this medicine. Men must use a latex condom during sexual contact with a woman while taking this medicine and for at least 4 months after stopping it. A latex condom is needed even if you have had a vasectomy. Contact your doctor right away if your partner becomes pregnant. Do not donate sperm while taking this  medicine and for at least 4 months after you stop taking this medicine. Men should inform their doctors if they wish to father a child. This medicine may lower sperm counts. What side effects may I notice from receiving this medication? Side effects that you should report to your doctor or health care professional as soon as  possible: allergic reactions like skin rash, itching or hives, swelling of the face, lips, or tongue low blood counts - this medicine may decrease the number of white blood cells, red blood cells, and platelets. You may be at increased risk for infections and bleeding nausea, vomiting redness, blistering, peeling or loosening of the skin, including inside the mouth signs and symptoms of infection like fever; chills; cough; sore throat; pain or trouble passing urine signs and symptoms of low red blood cells or anemia such as unusually weak or tired; feeling faint or lightheaded; falls; breathing problems unusual bruising or bleeding Side effects that usually do not require medical attention (report to your doctor or health care professional if they continue or are bothersome): changes in taste diarrhea hair loss loss of appetite mouth sores This list may not describe all possible side effects. Call your doctor for medical advice about side effects. You may report side effects to FDA at 1-800-FDA-1088. Where should I keep my medication? This drug is given in a hospital or clinic and will not be stored at home. NOTE: This sheet is a summary. It may not cover all possible information. If you have questions about this medicine, talk to your doctor, pharmacist, or health care provider.  2022 [employer] Standard (2020-11-28 00:00:00)

## 2021-02-28 ENCOUNTER — Inpatient Hospital Stay: Payer: Medicare Other

## 2021-02-28 ENCOUNTER — Other Ambulatory Visit: Payer: Self-pay

## 2021-02-28 ENCOUNTER — Ambulatory Visit: Payer: Medicare Other

## 2021-02-28 VITALS — BP 144/64 | HR 82 | Temp 98.3°F | Resp 18

## 2021-02-28 DIAGNOSIS — J449 Chronic obstructive pulmonary disease, unspecified: Secondary | ICD-10-CM | POA: Diagnosis not present

## 2021-02-28 DIAGNOSIS — Z5112 Encounter for antineoplastic immunotherapy: Secondary | ICD-10-CM | POA: Diagnosis not present

## 2021-02-28 DIAGNOSIS — E222 Syndrome of inappropriate secretion of antidiuretic hormone: Secondary | ICD-10-CM

## 2021-02-28 DIAGNOSIS — C3432 Malignant neoplasm of lower lobe, left bronchus or lung: Secondary | ICD-10-CM

## 2021-02-28 DIAGNOSIS — C787 Secondary malignant neoplasm of liver and intrahepatic bile duct: Secondary | ICD-10-CM | POA: Diagnosis not present

## 2021-02-28 DIAGNOSIS — E890 Postprocedural hypothyroidism: Secondary | ICD-10-CM | POA: Diagnosis not present

## 2021-02-28 DIAGNOSIS — C3411 Malignant neoplasm of upper lobe, right bronchus or lung: Secondary | ICD-10-CM | POA: Diagnosis not present

## 2021-02-28 DIAGNOSIS — Z7901 Long term (current) use of anticoagulants: Secondary | ICD-10-CM | POA: Diagnosis not present

## 2021-02-28 DIAGNOSIS — Z86711 Personal history of pulmonary embolism: Secondary | ICD-10-CM | POA: Diagnosis not present

## 2021-02-28 DIAGNOSIS — Z5111 Encounter for antineoplastic chemotherapy: Secondary | ICD-10-CM | POA: Diagnosis not present

## 2021-02-28 DIAGNOSIS — Z79899 Other long term (current) drug therapy: Secondary | ICD-10-CM | POA: Diagnosis not present

## 2021-02-28 DIAGNOSIS — D649 Anemia, unspecified: Secondary | ICD-10-CM | POA: Diagnosis not present

## 2021-02-28 DIAGNOSIS — C7951 Secondary malignant neoplasm of bone: Secondary | ICD-10-CM | POA: Diagnosis not present

## 2021-02-28 DIAGNOSIS — E871 Hypo-osmolality and hyponatremia: Secondary | ICD-10-CM

## 2021-02-28 MED ORDER — HEPARIN SOD (PORK) LOCK FLUSH 100 UNIT/ML IV SOLN
500.0000 [IU] | Freq: Once | INTRAVENOUS | Status: AC | PRN
  Administered 2021-02-28: 500 [IU]

## 2021-02-28 MED ORDER — SODIUM CHLORIDE 0.9 % IV SOLN
75.0000 mg/m2 | Freq: Once | INTRAVENOUS | Status: AC
  Administered 2021-02-28: 160 mg via INTRAVENOUS
  Filled 2021-02-28: qty 8

## 2021-02-28 MED ORDER — SODIUM CHLORIDE 0.9% FLUSH
10.0000 mL | INTRAVENOUS | Status: DC | PRN
  Administered 2021-02-28: 10 mL

## 2021-02-28 MED ORDER — SODIUM CHLORIDE 0.9 % IV SOLN
Freq: Once | INTRAVENOUS | Status: AC
Start: 2021-02-28 — End: 2021-02-28

## 2021-02-28 MED ORDER — SODIUM CHLORIDE 0.9 % IV SOLN
10.0000 mg | Freq: Once | INTRAVENOUS | Status: AC
  Administered 2021-02-28: 10 mg via INTRAVENOUS
  Filled 2021-02-28: qty 10

## 2021-02-28 NOTE — Patient Instructions (Signed)
Etoposide, VP-16 injection What is this medication? ETOPOSIDE, VP-16 (e toe POE side) is a chemotherapy drug. It is used to treat testicular cancer, lung cancer, and other cancers. This medicine may be used for other purposes; ask your health care provider or pharmacist if you have questions. COMMON BRAND NAME(S): Etopophos, Toposar, VePesid What should I tell my care team before I take this medication? They need to know if you have any of these conditions: infection kidney disease liver disease low blood counts, like low white cell, platelet, or red cell counts an unusual or allergic reaction to etoposide, other medicines, foods, dyes, or preservatives pregnant or trying to get pregnant breast-feeding How should I use this medication? This medicine is for infusion into a vein. It is administered in a hospital or clinic by a specially trained health care professional. Talk to your pediatrician regarding the use of this medicine in children. Special care may be needed. Overdosage: If you think you have taken too much of this medicine contact a poison control center or emergency room at once. NOTE: This medicine is only for you. Do not share this medicine with others. What if I miss a dose? It is important not to miss your dose. Call your doctor or health care professional if you are unable to keep an appointment. What may interact with this medication? This medicine may interact with the following medications: warfarin This list may not describe all possible interactions. Give your health care provider a list of all the medicines, herbs, non-prescription drugs, or dietary supplements you use. Also tell them if you smoke, drink alcohol, or use illegal drugs. Some items may interact with your medicine. What should I watch for while using this medication? Visit your doctor for checks on your progress. This drug may make you feel generally unwell. This is not uncommon, as chemotherapy can affect  healthy cells as well as cancer cells. Report any side effects. Continue your course of treatment even though you feel ill unless your doctor tells you to stop. In some cases, you may be given additional medicines to help with side effects. Follow all directions for their use. Call your doctor or health care professional for advice if you get a fever, chills or sore throat, or other symptoms of a cold or flu. Do not treat yourself. This drug decreases your body's ability to fight infections. Try to avoid being around people who are sick. This medicine may increase your risk to bruise or bleed. Call your doctor or health care professional if you notice any unusual bleeding. Talk to your doctor about your risk of cancer. You may be more at risk for certain types of cancers if you take this medicine. Do not become pregnant while taking this medicine or for at least 6 months after stopping it. Women should inform their doctor if they wish to become pregnant or think they might be pregnant. Women of child-bearing potential will need to have a negative pregnancy test before starting this medicine. There is a potential for serious side effects to an unborn child. Talk to your health care professional or pharmacist for more information. Do not breast-feed an infant while taking this medicine. Men must use a latex condom during sexual contact with a woman while taking this medicine and for at least 4 months after stopping it. A latex condom is needed even if you have had a vasectomy. Contact your doctor right away if your partner becomes pregnant. Do not donate sperm while taking this  medicine and for at least 4 months after you stop taking this medicine. Men should inform their doctors if they wish to father a child. This medicine may lower sperm counts. What side effects may I notice from receiving this medication? Side effects that you should report to your doctor or health care professional as soon as  possible: allergic reactions like skin rash, itching or hives, swelling of the face, lips, or tongue low blood counts - this medicine may decrease the number of white blood cells, red blood cells, and platelets. You may be at increased risk for infections and bleeding nausea, vomiting redness, blistering, peeling or loosening of the skin, including inside the mouth signs and symptoms of infection like fever; chills; cough; sore throat; pain or trouble passing urine signs and symptoms of low red blood cells or anemia such as unusually weak or tired; feeling faint or lightheaded; falls; breathing problems unusual bruising or bleeding Side effects that usually do not require medical attention (report to your doctor or health care professional if they continue or are bothersome): changes in taste diarrhea hair loss loss of appetite mouth sores This list may not describe all possible side effects. Call your doctor for medical advice about side effects. You may report side effects to FDA at 1-800-FDA-1088. Where should I keep my medication? This drug is given in a hospital or clinic and will not be stored at home. NOTE: This sheet is a summary. It may not cover all possible information. If you have questions about this medicine, talk to your doctor, pharmacist, or health care provider.  2022 [employer] Standard (2020-11-28 00:00:00)

## 2021-03-01 ENCOUNTER — Encounter: Payer: Self-pay | Admitting: Oncology

## 2021-03-02 ENCOUNTER — Encounter: Payer: Self-pay | Admitting: Oncology

## 2021-03-02 ENCOUNTER — Inpatient Hospital Stay: Payer: Medicare Other

## 2021-03-02 ENCOUNTER — Other Ambulatory Visit: Payer: Self-pay

## 2021-03-02 VITALS — BP 126/60 | HR 93 | Temp 98.1°F | Resp 18 | Ht 63.0 in | Wt 220.0 lb

## 2021-03-02 DIAGNOSIS — Z86711 Personal history of pulmonary embolism: Secondary | ICD-10-CM | POA: Diagnosis not present

## 2021-03-02 DIAGNOSIS — C787 Secondary malignant neoplasm of liver and intrahepatic bile duct: Secondary | ICD-10-CM | POA: Diagnosis not present

## 2021-03-02 DIAGNOSIS — C3432 Malignant neoplasm of lower lobe, left bronchus or lung: Secondary | ICD-10-CM

## 2021-03-02 DIAGNOSIS — C3411 Malignant neoplasm of upper lobe, right bronchus or lung: Secondary | ICD-10-CM | POA: Diagnosis not present

## 2021-03-02 DIAGNOSIS — Z5112 Encounter for antineoplastic immunotherapy: Secondary | ICD-10-CM | POA: Diagnosis not present

## 2021-03-02 DIAGNOSIS — C7951 Secondary malignant neoplasm of bone: Secondary | ICD-10-CM | POA: Diagnosis not present

## 2021-03-02 DIAGNOSIS — E871 Hypo-osmolality and hyponatremia: Secondary | ICD-10-CM

## 2021-03-02 DIAGNOSIS — Z5111 Encounter for antineoplastic chemotherapy: Secondary | ICD-10-CM | POA: Diagnosis not present

## 2021-03-02 DIAGNOSIS — D649 Anemia, unspecified: Secondary | ICD-10-CM | POA: Diagnosis not present

## 2021-03-02 DIAGNOSIS — E890 Postprocedural hypothyroidism: Secondary | ICD-10-CM | POA: Diagnosis not present

## 2021-03-02 DIAGNOSIS — E222 Syndrome of inappropriate secretion of antidiuretic hormone: Secondary | ICD-10-CM

## 2021-03-02 DIAGNOSIS — Z7901 Long term (current) use of anticoagulants: Secondary | ICD-10-CM | POA: Diagnosis not present

## 2021-03-02 DIAGNOSIS — Z79899 Other long term (current) drug therapy: Secondary | ICD-10-CM | POA: Diagnosis not present

## 2021-03-02 MED ORDER — PEGFILGRASTIM-BMEZ 6 MG/0.6ML ~~LOC~~ SOSY
6.0000 mg | PREFILLED_SYRINGE | Freq: Once | SUBCUTANEOUS | Status: AC
  Administered 2021-03-02: 6 mg via SUBCUTANEOUS
  Filled 2021-03-02: qty 0.6

## 2021-03-02 NOTE — Patient Instructions (Signed)

## 2021-03-12 NOTE — Progress Notes (Signed)
Sent in request for DOS 12/28 and 12/29 to [employer].

## 2021-03-14 NOTE — Progress Notes (Signed)
[facility]  [address] [place],  [place]  [address] [phone]  Clinic Day:  03/21/2021  Referring physician: Helen Hashimoto., MD  This document serves as a record of services personally performed by Hosie Poisson, MD. It was created on their behalf by [facility] E, a trained medical scribe. The creation of this record is based on the scribe's personal observations and the provider's statements to them.  ASSESSMENT & PLAN:   Assessment & Plan: Small cell carcinoma of lower lobe of left lung (HCC) Small cell carcinoma of the lung, at least a clinical stage IIIB, diagnosed in October 2021.   She was initially treated with chemotherapy with carboplatin/etoposide with a good response.  She was then switched to nivolumab. However, she was found to have recent progression on disease in August 2022.  She underwent palliative radiation therapy, which she completed in August. She is now receiving palliative chemotherapy consisting of carboplatin/etoposide/atezolizumab and completed 4 cycles. CT imaging from December has shown a positive response. We will now proceed with maintenance atezolizumab.  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 She was placed on zoledronic acid in March 2022.  She developed severe hypocalcemia with her 1st dose and we have not resumed this yet.  Secondary malignant neoplasm of the liver She resumed palliative chemotherapy/immunotherapy consisting of carboplatin/etoposide/atezolizumab and had her 1st cycle beginning on October 3rd. She recently completed 4 cycles and will proceed with maintenance immunotherapy.  Non small cell lung cancer, right, 2010 Treated with chemoradiation  SIADH (syndrome of inappropriate ADH production) (HCC) Her sodium is normal on  salt tablets twice daily. I advised that she try decreasing this to once daily.  Hypothyroidism Post thyroidectomy hypothyroidism, currently on levothyroxine 100 mcg daily. She states she is taking this dose before breakfast without food or other medications.   Hypocalcemia She continues oral calcium supplement three times daily.  Anemia Worse, and mildly symptomatic. We will transfuse with 1 unit of PRBCs.   Bilateral lower lobe segmental pulmonary emboli almost occlusive in the left lower lobe, October 2021 She remains on Eliquis 5 mg BID.   She recently completed 4 cycles of carboplatin/etoposide/atezolizumab. CT imaging has revealed a positive response with a decrease in size of the subcarinal lymph node now measuring 7 mm, previously 16 mm, and a decrease in size of the dominant hepatic lesion, measuring 2.2 x 2.4 cm previously 2.9 x 2.4 cm. We will proceed with her 1st cycle of maintenance atezolizumab tomorrow. She knows to continue oral calcium supplement TID, oral potassium supplement 20 meq BID, and salt tablets once daily. Otherwise, we will plan to see her back in 3 weeks with CBC, comprehensive metabolic panel, TSH and T4 prior to her next maintenance immunotherapy. She understands and agrees with this plan of care.  I provided 15 minutes of face-to-face time during this this encounter and > 50% was spent counseling as documented under my assessment and plan.    [facility] [address] [place] [address] Dept: [phone] Dept Fax: [phone]   No orders of the defined types were placed in this encounter.     CHIEF COMPLAINT:  CC:   Small cell carcinoma with bone metastasis  Current Treatment:   Palliative  carboplatin/etoposide/atezolizumab   HISTORY OF PRESENT  ILLNESS:   Oncology History  Small cell carcinoma of lower lobe of left lung ([place])  01/08/2020 Imaging   CTA CHEST WITH  CONTRAST: No evidence of pulmonary embolus.   Tumor encases and narrows the left lower lobe pulmonary artery in the inferior left hilum measuring up to 5.2 cm. This is increased from 1.9 cm previously.   Irregular calcified and noncalcified plaque in the descending thoracic aorta. Coronary artery disease.   Left lower lobe atelectasis.   Aortic Atherosclerosis (ICD10-I70.0) and Emphysema (ICD10-J43.9).    01/11/2020 Pathology Results   LEFT LOWER LOBE LUNG MASS, BIOPSY:               SMALL CELL CARCINOMA WITH CRUSH ARTIFACT   01/20/2020 Initial Diagnosis   Lung cancer, lower lobe ([place])   01/21/2020 Cancer Staging   Staging form: Lung, AJCC 8th Edition - Pathologic stage from 01/21/2020: Stage IIIB (pT3, pN2, cM0) - Signed by Derwood Kaplan, MD on 01/21/2020    01/31/2020 - 04/28/2020 Chemotherapy   Carboplatin/etoposide        06/08/2020 - 10/13/2020 Chemotherapy   Nivolumab        08/28/2020 Imaging   CT CHEST, ABDOMEN AND PELVIS WITHOUT CONTRAST: 1. Progressive soft tissue thickening between the aorta and the left mainstem bronchus suspicious for recurrent tumor. PET-CT may be helpful for further evaluation if clinically indicated. 2. Stable radiation changes involving the right hilum and right lower lobe. 3. Stable severe emphysematous changes and pulmonary scarring. 4. Stable right hepatic lobe lesion. 5. Stable scattered sclerotic bone lesions. 6. Stable advanced atherosclerotic calcifications involving the thoracic and abdominal aorta and branch vessels including the coronary arteries. 7. Chronic bilateral hip AVN. 8. Emphysema and aortic atherosclerosis.   10/15/2020 Imaging   CTA CHEST WITH CONTRAST: 1. No evidence of a pulmonary embolism. 2. No acute findings. 3. Soft tissue mass adjacent to the left peripheral mainstem bronchus has increased in size from the prior CT concerning for recurrence or progression of carcinoma. 4. No other change. 5.  Advanced emphysema, coronary artery calcifications and aortic atherosclerosis.   12/22/2020 Imaging   CT CHEST, ABDOMEN AND PELVIS WITH CONTRAST: 1. Interval mixed response to therapy. 2. There has been interval resolution of left hilar adenopathy. Masslike architectural distortion within the superior segment of right lower lobe is slightly decreased in size in the interval. 3. Interval enlargement of subcarinal lymph node concerning for metastatic adenopathy. 4. There are several new, low-attenuation lesions within both lobes of liver. Worrisome for metastatic disease. 5. Stable appearance of multifocal sclerotic bone metastases. 6. Aortic Atherosclerosis (ICD10-I70.0) and Emphysema (ICD10-J43.9).   12/25/2020 -  Chemotherapy   Patient is on Treatment Plan : LUNG SCLC Carboplatin + Etoposide + Atezolizumab Induction q21d / Atezolizumab Maintenance q21d     03/20/2021 Imaging   CT CHEST, ABDOMEN AND PELVIS WITH CONTRAST: Stable post treatment changes in the right hilum and superior RIGHT  mediastinal border.  Decreased size of subcarinal lymph node.  Stable small bilateral pulmonary nodules.  Decreased size of the dominant hepatic lesion, smaller hepatic  lesion seen on the prior study is no longer visible on today's exam.  No change in the appearance of multifocal areas of bony metastatic  disease with sclerosis.  Bilateral femoral AVN of the femoral heads.  Emphysema and aortic atherosclerosis.  Aortic Atherosclerosis (ICD10-I70.0) and Emphysema (ICD10-J43.9).    Secondary malignant neoplasm of bone and bone marrow ([place])  05/29/2020 Imaging   CT CHEST, ABDOMEN AND PELVIS WITH CONTRAST:  1. New osseous metastatic disease. 2. Post radiation changes in the right hemithorax, stable. 3. Aortic atherosclerosis (ICD10-I70.0). Coronary artery calcification. 4. Emphysema (ICD10-J43.9).     INTERVAL HISTORY:  Gabrielle Hudson is here for routine follow up after completing 4 cycle of  carboplatin/etoposide/atezolizumab. CT chest, abdomen and pelvis from December 27th revealed stable post treatment changes in the right hilum and superior right mediastinal border. Decreased size of subcarinal lymph node now measuring 7 mm, previously 16 mm. Stable small bilateral pulmonary nodules. Decreased size of the dominant hepatic lesion, measuring 2.2 x 2.4 cm previously 2.9 x 2.4 cm, smaller hepatic lesion seen on the prior study is no longer visible on today's exam. No change in the appearance of multifocal areas of bony metastatic disease with sclerosis. She continues to have chronic shortness of breath, but no worse than normal, and remains on supplemental oxygen. Hemoglobin has decreased from 8.2 to 7.3, and white count and platelets are normal. We will arrange for transfusion of 1 unit of PRBCs. Chemistries are unremarkable. CEA was 6.1, previously 6.5 in November. Her  appetite is good, and her weight is stable since her last visit.  She denies fever, chills or other signs of infection.  She denies nausea, vomiting, bowel issues, or abdominal pain.  She denies sore throat, cough or chest pain.  REVIEW OF SYSTEMS:  Review of Systems  Constitutional: Negative.  Negative for appetite change, chills, fatigue, fever and unexpected weight change.  HENT:  Negative.    Eyes: Negative.   Respiratory:  Positive for shortness of breath (chronic, on supplemental oxygen). Negative for chest tightness, cough, hemoptysis and wheezing.   Cardiovascular: Negative.  Negative for chest pain, leg swelling and palpitations.  Gastrointestinal: Negative.  Negative for abdominal distention, abdominal pain, blood in stool, constipation, diarrhea, nausea and vomiting.  Endocrine: Negative.   Genitourinary:  Negative for difficulty urinating, dysuria, frequency and hematuria.   Musculoskeletal:  Positive for gait problem (uses a motorized wheelchair). Negative for arthralgias, back pain, flank pain and myalgias.   Skin: Negative.   Neurological:  Positive for gait problem (uses a motorized wheelchair). Negative for dizziness, extremity weakness, headaches, light-headedness, numbness, seizures and speech difficulty.  Hematological: Negative.   Psychiatric/Behavioral: Negative.  Negative for depression and sleep disturbance. The patient is not nervous/anxious.     VITALS:  Blood pressure (!) 159/71, pulse (!) 101, temperature 98.2 F (36.8 C), temperature source Oral, resp. rate 20, height 5\' 3"  (1.6 m), weight 220 lb 14.4 oz (100.2 kg), SpO2 99 %.  Wt Readings from Last 3 Encounters:  03/21/21 220 lb 14.4 oz (100.2 kg)  03/02/21 220 lb (99.8 kg)  02/26/21 220 lb 1.9 oz (99.8 kg)    Body mass index is 39.13 kg/m.  Performance status (ECOG): 2 - Symptomatic, <50% confined to bed  PHYSICAL EXAM:  Physical Exam Constitutional:      General: She is not in acute distress.    Appearance: Normal appearance. She is normal weight.  HENT:     Head: Normocephalic and atraumatic.  Eyes:     General: No scleral icterus.    Extraocular Movements: Extraocular movements intact.     Conjunctiva/sclera: Conjunctivae normal.     Pupils: Pupils are equal, round, and reactive to light.  Cardiovascular:     Rate and Rhythm: Regular rhythm. Tachycardia present.     Pulses: Normal pulses.     Heart sounds: Normal heart sounds. No murmur heard.   No friction rub. No gallop.  Pulmonary:  Effort: Pulmonary effort is normal. No respiratory distress.     Breath sounds: Decreased breath sounds (of the left upper lobe, no breath sounds of the left lower lobe) present.  Abdominal:     General: Bowel sounds are normal. There is no distension.     Palpations: Abdomen is soft. There is no hepatomegaly, splenomegaly or mass.     Tenderness: There is no abdominal tenderness.  Musculoskeletal:        General: Normal range of motion.     Cervical back: Normal range of motion and neck supple.     Right lower leg: No  edema.     Left lower leg: No edema.  Lymphadenopathy:     Cervical: No cervical adenopathy.  Skin:    General: Skin is warm and dry.  Neurological:     General: No focal deficit present.     Mental Status: She is alert and oriented to person, place, and time. Mental status is at baseline.  Psychiatric:        Mood and Affect: Mood normal.        Behavior: Behavior normal.        Thought Content: Thought content normal.        Judgment: Judgment normal.   LABS:   CBC Latest Ref Rng & Units 02/23/2021 02/02/2021 01/11/2021  WBC - 9.3 8.5 4.1  Hemoglobin 12.0 - 16.0 8.2(A) 8.5(A) 9.9(A)  Hematocrit 36 - 46 25(A) 25(A) 30(A)  Platelets 150 - 399 291 213 133(A)   CMP Latest Ref Rng & Units 02/23/2021 02/02/2021 01/11/2021  Glucose 70 - 99 mg/dL - - -  BUN 4 - 21 11 14 17   Creatinine 0.5 - 1.1 0.9 0.7 0.9  Sodium 137 - 147 138 136(A) 139  Potassium 3.4 - 5.3 3.9 4.3 3.8  Chloride 99 - 108 103 102 101  CO2 13 - 22 27(A) 26(A) 29(A)  Calcium 8.7 - 10.7 9.4 8.5(A) 8.2(A)  Total Protein 6.5 - 8.1 g/dL - - -  Total Bilirubin 0.3 - 1.2 mg/dL - - -  Alkaline Phos 25 - 125 111 91 102  AST 13 - 35 28 22 41(A)  ALT 7 - 35 26 19 36(A)     Lab Results  Component Value Date   CEA1 6.5 (H) 02/02/2021   /  CEA  Date Value Ref Range Status  02/02/2021 6.5 (H) 0.0 - 4.7 ng/mL Final    Comment:    (NOTE)                             Nonsmokers          <3.9                             Smokers             <5.6 [employer] Electrochemiluminescence Immunoassay (ECLIA) Values obtained with different assay methods or kits cannot be used interchangeably.  Results cannot be interpreted as absolute evidence of the presence or absence of malignant disease. Performed At: [facility] [place], [place] [id number] Rush Farmer MD JH:[id number]     Lab Results  Component Value Date   TOTALPROTELP 4.7 (L) 03/26/2011   ALBUMINELP 52.5 (L) 03/26/2011   A1GS 8.7  (H) 03/26/2011   A2GS 18.8 (H) 03/26/2011   BETS 4.5 (L) 03/26/2011   BETA2SER 4.7 03/26/2011  GAMS 10.8 (L) 03/26/2011   MSPIKE NOT DETECTED 03/26/2011   SPEI (NOTE) 03/26/2011   Lab Results  Component Value Date   TIBC 226 04/19/2020   TIBC 219 (L) 03/29/2011   FERRITIN 361 04/19/2020   FERRITIN 356 (H) 03/29/2011   IRONPCTSAT 44.2 04/19/2020   IRONPCTSAT 65 (H) 03/29/2011   Lab Results  Component Value Date   LDH 349 (H) 03/26/2011    STUDIES:  No results found.     EXAM: 03/20/2021 CT CHEST, ABDOMEN, AND PELVIS WITH CONTRAST   TECHNIQUE:  Multidetector CT imaging of the chest, abdomen and pelvis was  performed following the standard protocol during bolus  administration of intravenous contrast.   CONTRAST: 100 cc Isovue 370   COMPARISON: Comparison is made with December 22, 2020.   FINDINGS:  CT CHEST FINDINGS  Cardiovascular: Calcified and noncalcified atheromatous plaque of  the thoracic aorta. Heart size is stable and normal. Three-vessel  coronary artery disease. Normal appearance of central pulmonary  vasculature. RIGHT-sided Port-A-Cath terminates at the caval to  atrial junction.  Mediastinum/Nodes: No adenopathy in the chest. Decreased size of  subcarinal lymph node (image 30/2), this measures 7 mm short axis as  compared with 16 mm short axis on the previous exam. Distortion of  the RIGHT hilum secondary to post treatment changes is similar to  previous imaging. Post thyroidectomy.  Lungs/Pleura: Scarring along the superior RIGHT mediastinal border  and RIGHT hilum related to post treatment changes.  Marked pulmonary emphysema with similar appearance to prior imaging.  No new areas of consolidation. No sign of pleural effusion.  Scattered small nodules in the upper lobe on the RIGHT with no  change. (Image 19/4) 5 mm in the posterior RIGHT upper lobe.  (Image 23/4) 6 mm in the anterior subpleural RIGHT upper lobe.  8 mm LEFT lower lobe pulmonary  nodule with ground-glass features is  unchanged.  Musculoskeletal: See below for full musculoskeletal details.   CT ABDOMEN PELVIS FINDINGS  Hepatobiliary: Lobular hepatic contours. Background hepatic  steatosis is suspected based on density. Portal vein is patent.  Post cholecystectomy without substantial biliary duct distension.  Low-density hepatic lesion in the RIGHT hepatic lobe (image 54/2)  2.2 x 2.4 cm previously 2.9 x 2.4 cm. Ill-defined smaller  low-density area in the posterior RIGHT hemi liver is not seen on  the current study having resolved in the interval.  Pancreas: Normal, without mass, inflammation or ductal dilatation.  Spleen: Normal spleen.  Adrenals/Urinary Tract:  Adrenal glands are unremarkable. Symmetric renal enhancement. No  sign of hydronephrosis. No suspicious renal lesion or perinephric  stranding.  Urinary bladder is grossly unremarkable.  Stable presumed cyst in the upper pole of the LEFT kidney.  Stomach/Bowel: No acute gastrointestinal process. Signs of small  bowel diverticular changes in the mid ileum. Normal appendix.  Diverticular changes of the sigmoid colon.  Vascular/Lymphatic:  Aortic atherosclerosis. No sign of aneurysm. Smooth contour of the  IVC. There is no gastrohepatic or hepatoduodenal ligament  lymphadenopathy. No retroperitoneal or mesenteric lymphadenopathy.  No pelvic sidewall lymphadenopathy.  Reproductive: Unremarkable, no adnexal mass.  Other: No ascites. No free air.  Musculoskeletal: Scattered sclerotic foci in the spine and in the  pelvis are unchanged. Similarly a small sclerotic focus in the  sternal manubrium and scattered areas of sclerosis in the ribs  without change. No frankly destructive bony process.  Sternal lesion (image 10/2) 5 mm.  T11 spinal lesion (image 51/2) 21 mm, unchanged.  LEFT ischial lesion  with mixed lytic and sclerotic changes,  well-defined sclerotic border measuring 11 mm. This is also   unchanged. Other smaller foci with similar appearance. Bilateral  femoral AVN of the femoral heads.   IMPRESSION:  Stable post treatment changes in the right hilum and superior RIGHT  mediastinal border.  Decreased size of subcarinal lymph node.  Stable small bilateral pulmonary nodules.  Decreased size of the dominant hepatic lesion, smaller hepatic  lesion seen on the prior study is no longer visible on today's exam.  No change in the appearance of multifocal areas of bony metastatic  disease with sclerosis.  Bilateral femoral AVN of the femoral heads.  Emphysema and aortic atherosclerosis.  Aortic Atherosclerosis (ICD10-I70.0) and Emphysema (ICD10-J43.9).   HISTORY:   Allergies:  Allergies  Allergen Reactions   Nitroglycerin In D5w Other (See Comments)    "flatlines"   Nitroglycerin Other (See Comments)    Drops her BP 'flatlines'     Current Medications: Current Outpatient Medications  Medication Sig Dispense Refill   albuterol (PROVENTIL) (2.5 MG/3ML) 0.083% nebulizer solution SMARTSIG:1 Vial(s) Via Nebulizer 4 Times Daily PRN     albuterol (VENTOLIN HFA) 108 (90 Base) MCG/ACT inhaler Inhale into the lungs every 6 (six) hours as needed for wheezing or shortness of breath.     budesonide-formoterol (SYMBICORT) 160-4.5 MCG/ACT inhaler Inhale 2 puffs into the lungs 2 (two) times daily.       calcium citrate-vitamin D (CITRACAL+D) 315-200 MG-UNIT tablet Take 1 tablet by mouth in the morning, at noon, and at bedtime.     Cholecalciferol (VITAMIN D3) 1.25 MG (50000 UT) CAPS Take 1.25 mg by mouth once a week. 12 capsule 3   cyclobenzaprine (FLEXERIL) 10 MG tablet Take 1 tablet (10 mg total) by mouth 3 (three) times daily as needed for muscle spasms. 30 tablet 3   diltiazem (CARDIZEM CD) 120 MG 24 hr capsule Take 120 mg by mouth daily.     ELIQUIS 5 MG TABS tablet Take 1 tablet by mouth twice daily 60 tablet 2   famotidine (PEPCID) 20 MG tablet Take 20 mg by mouth 2 (two) times  daily.     furosemide (LASIX) 20 MG tablet Take 20 mg by mouth as needed. ONLY TAKES IT HAVING SWELLING, can take 2 tablets it more than a 3 lb weight gain ever night     gabapentin (NEURONTIN) 300 MG capsule Take 1 capsule (300 mg total) by mouth 2 (two) times daily. 60 capsule 4   HYDROcodone-acetaminophen (NORCO/VICODIN) 5-325 MG tablet Take 1 tablet by mouth every 4 (four) hours as needed for moderate pain. 30 tablet 0   levothyroxine (SYNTHROID) 100 MCG tablet Take 1 tablet (100 mcg total) by mouth daily before breakfast. 30 tablet 5   loratadine (CLARITIN) 10 MG tablet Take 10 mg by mouth daily.     meclizine (ANTIVERT) 25 MG tablet Take 0.5-1 tablets (12.5-25 mg total) by mouth 3 (three) times daily as needed for dizziness. 120 tablet 3   OXYGEN Inhale 3 L into the lungs continuous.     Potassium Chloride ER 20 MEQ TBCR Take 1 tablet by mouth 2 (two) times daily. 60 tablet 5   rOPINIRole (REQUIP) 0.5 MG tablet Take 0.5 mg by mouth at bedtime.     sodium chloride 1 g tablet Take 1 g by mouth daily.     No current facility-administered medications for this visit.     I, Rita Ohara, am acting as scribe for Derwood Kaplan, MD  I have reviewed this report as typed by the medical scribe, and it is complete and accurate.

## 2021-03-15 ENCOUNTER — Other Ambulatory Visit: Payer: Self-pay | Admitting: Oncology

## 2021-03-16 ENCOUNTER — Ambulatory Visit: Payer: Medicare Other | Admitting: Oncology

## 2021-03-16 ENCOUNTER — Other Ambulatory Visit: Payer: Medicare Other

## 2021-03-20 ENCOUNTER — Ambulatory Visit: Payer: Medicare Other

## 2021-03-20 DIAGNOSIS — C3432 Malignant neoplasm of lower lobe, left bronchus or lung: Secondary | ICD-10-CM | POA: Diagnosis not present

## 2021-03-20 DIAGNOSIS — J439 Emphysema, unspecified: Secondary | ICD-10-CM | POA: Diagnosis not present

## 2021-03-20 DIAGNOSIS — I700 Atherosclerosis of aorta: Secondary | ICD-10-CM | POA: Diagnosis not present

## 2021-03-20 DIAGNOSIS — K5790 Diverticulosis of intestine, part unspecified, without perforation or abscess without bleeding: Secondary | ICD-10-CM | POA: Diagnosis not present

## 2021-03-20 DIAGNOSIS — K769 Liver disease, unspecified: Secondary | ICD-10-CM | POA: Diagnosis not present

## 2021-03-20 DIAGNOSIS — E890 Postprocedural hypothyroidism: Secondary | ICD-10-CM | POA: Diagnosis not present

## 2021-03-20 DIAGNOSIS — R946 Abnormal results of thyroid function studies: Secondary | ICD-10-CM | POA: Diagnosis not present

## 2021-03-20 DIAGNOSIS — C3490 Malignant neoplasm of unspecified part of unspecified bronchus or lung: Secondary | ICD-10-CM | POA: Diagnosis not present

## 2021-03-21 ENCOUNTER — Inpatient Hospital Stay (INDEPENDENT_AMBULATORY_CARE_PROVIDER_SITE_OTHER): Payer: Medicare Other | Admitting: Oncology

## 2021-03-21 ENCOUNTER — Telehealth: Payer: Self-pay | Admitting: Oncology

## 2021-03-21 ENCOUNTER — Encounter: Payer: Self-pay | Admitting: Oncology

## 2021-03-21 ENCOUNTER — Inpatient Hospital Stay: Payer: Medicare Other

## 2021-03-21 ENCOUNTER — Other Ambulatory Visit: Payer: Self-pay | Admitting: Hematology and Oncology

## 2021-03-21 ENCOUNTER — Other Ambulatory Visit: Payer: Self-pay | Admitting: Oncology

## 2021-03-21 VITALS — BP 159/71 | HR 101 | Temp 98.2°F | Resp 20 | Ht 63.0 in | Wt 220.9 lb

## 2021-03-21 DIAGNOSIS — D649 Anemia, unspecified: Secondary | ICD-10-CM

## 2021-03-21 DIAGNOSIS — Z86711 Personal history of pulmonary embolism: Secondary | ICD-10-CM | POA: Diagnosis not present

## 2021-03-21 DIAGNOSIS — Z7901 Long term (current) use of anticoagulants: Secondary | ICD-10-CM | POA: Diagnosis not present

## 2021-03-21 DIAGNOSIS — C3432 Malignant neoplasm of lower lobe, left bronchus or lung: Secondary | ICD-10-CM

## 2021-03-21 DIAGNOSIS — E890 Postprocedural hypothyroidism: Secondary | ICD-10-CM | POA: Diagnosis not present

## 2021-03-21 DIAGNOSIS — Z79899 Other long term (current) drug therapy: Secondary | ICD-10-CM | POA: Diagnosis not present

## 2021-03-21 DIAGNOSIS — E222 Syndrome of inappropriate secretion of antidiuretic hormone: Secondary | ICD-10-CM | POA: Diagnosis not present

## 2021-03-21 DIAGNOSIS — Z5111 Encounter for antineoplastic chemotherapy: Secondary | ICD-10-CM | POA: Diagnosis not present

## 2021-03-21 DIAGNOSIS — C7951 Secondary malignant neoplasm of bone: Secondary | ICD-10-CM | POA: Diagnosis not present

## 2021-03-21 DIAGNOSIS — I2782 Chronic pulmonary embolism: Secondary | ICD-10-CM

## 2021-03-21 DIAGNOSIS — C787 Secondary malignant neoplasm of liver and intrahepatic bile duct: Secondary | ICD-10-CM | POA: Diagnosis not present

## 2021-03-21 DIAGNOSIS — C7952 Secondary malignant neoplasm of bone marrow: Secondary | ICD-10-CM | POA: Diagnosis not present

## 2021-03-21 DIAGNOSIS — C3411 Malignant neoplasm of upper lobe, right bronchus or lung: Secondary | ICD-10-CM | POA: Diagnosis not present

## 2021-03-21 DIAGNOSIS — Z5112 Encounter for antineoplastic immunotherapy: Secondary | ICD-10-CM | POA: Diagnosis not present

## 2021-03-21 LAB — COMPREHENSIVE METABOLIC PANEL
Albumin: 4 (ref 3.5–5.0)
Calcium: 9.3 (ref 8.7–10.7)

## 2021-03-21 LAB — BASIC METABOLIC PANEL
BUN: 12 (ref 4–21)
CO2: 30 — AB (ref 13–22)
Chloride: 97 — AB (ref 99–108)
Creatinine: 0.9 (ref 0.5–1.1)
Glucose: 119
Potassium: 4.1 (ref 3.4–5.3)
Sodium: 136 — AB (ref 137–147)

## 2021-03-21 LAB — HEPATIC FUNCTION PANEL
ALT: 16 (ref 7–35)
AST: 19 (ref 13–35)
Alkaline Phosphatase: 90 (ref 25–125)
Bilirubin, Total: 0.6

## 2021-03-21 LAB — CBC AND DIFFERENTIAL
HCT: 23 — AB (ref 36–46)
Hemoglobin: 7.3 — AB (ref 12.0–16.0)
Neutrophils Absolute: 7.18
Platelets: 312 (ref 150–399)
WBC: 9.7

## 2021-03-21 LAB — CEA: CEA: 6.1

## 2021-03-21 LAB — CBC: RBC: 2.4 — AB (ref 3.87–5.11)

## 2021-03-21 LAB — PREPARE RBC (CROSSMATCH)

## 2021-03-21 LAB — ABO/RH: ABO/RH(D): A NEG

## 2021-03-21 MED ORDER — APIXABAN 5 MG PO TABS: 5.0000 mg | ORAL_TABLET | Freq: Two times a day (BID) | ORAL | 5 refills | Status: DC

## 2021-03-21 NOTE — Telephone Encounter (Signed)
Per 12/28 los next appt scheduled and given to patient

## 2021-03-22 ENCOUNTER — Inpatient Hospital Stay: Payer: Medicare Other

## 2021-03-22 ENCOUNTER — Encounter: Payer: Self-pay | Admitting: Oncology

## 2021-03-22 ENCOUNTER — Other Ambulatory Visit: Payer: Self-pay

## 2021-03-22 VITALS — BP 136/54 | HR 83 | Temp 98.3°F | Resp 20 | Ht 63.0 in | Wt 221.0 lb

## 2021-03-22 DIAGNOSIS — Z5112 Encounter for antineoplastic immunotherapy: Secondary | ICD-10-CM | POA: Diagnosis not present

## 2021-03-22 DIAGNOSIS — C7951 Secondary malignant neoplasm of bone: Secondary | ICD-10-CM | POA: Diagnosis not present

## 2021-03-22 DIAGNOSIS — E871 Hypo-osmolality and hyponatremia: Secondary | ICD-10-CM

## 2021-03-22 DIAGNOSIS — E890 Postprocedural hypothyroidism: Secondary | ICD-10-CM | POA: Diagnosis not present

## 2021-03-22 DIAGNOSIS — D649 Anemia, unspecified: Secondary | ICD-10-CM | POA: Diagnosis not present

## 2021-03-22 DIAGNOSIS — E222 Syndrome of inappropriate secretion of antidiuretic hormone: Secondary | ICD-10-CM | POA: Diagnosis not present

## 2021-03-22 DIAGNOSIS — Z79899 Other long term (current) drug therapy: Secondary | ICD-10-CM | POA: Diagnosis not present

## 2021-03-22 DIAGNOSIS — C787 Secondary malignant neoplasm of liver and intrahepatic bile duct: Secondary | ICD-10-CM | POA: Diagnosis not present

## 2021-03-22 DIAGNOSIS — Z86711 Personal history of pulmonary embolism: Secondary | ICD-10-CM | POA: Diagnosis not present

## 2021-03-22 DIAGNOSIS — Z7901 Long term (current) use of anticoagulants: Secondary | ICD-10-CM | POA: Diagnosis not present

## 2021-03-22 DIAGNOSIS — Z5111 Encounter for antineoplastic chemotherapy: Secondary | ICD-10-CM | POA: Diagnosis not present

## 2021-03-22 DIAGNOSIS — C3411 Malignant neoplasm of upper lobe, right bronchus or lung: Secondary | ICD-10-CM | POA: Diagnosis not present

## 2021-03-22 DIAGNOSIS — C3432 Malignant neoplasm of lower lobe, left bronchus or lung: Secondary | ICD-10-CM

## 2021-03-22 MED ORDER — SODIUM CHLORIDE 0.9% FLUSH: 3.0000 mL | INTRAVENOUS | Status: DC | PRN

## 2021-03-22 MED ORDER — SODIUM CHLORIDE 0.9 % IV SOLN
1200.0000 mg | Freq: Once | INTRAVENOUS | Status: AC
  Administered 2021-03-22: 1200 mg via INTRAVENOUS
  Filled 2021-03-22: qty 20

## 2021-03-22 MED ORDER — HEPARIN SOD (PORK) LOCK FLUSH 100 UNIT/ML IV SOLN
500.0000 [IU] | Freq: Once | INTRAVENOUS | Status: AC | PRN
  Administered 2021-03-22: 500 [IU]

## 2021-03-22 MED ORDER — HEPARIN SOD (PORK) LOCK FLUSH 100 UNIT/ML IV SOLN: 250.0000 [IU] | INTRAVENOUS | Status: DC | PRN

## 2021-03-22 MED ORDER — SODIUM CHLORIDE 0.9% IV SOLUTION
250.0000 mL | Freq: Once | INTRAVENOUS | Status: AC
  Administered 2021-03-22: 250 mL via INTRAVENOUS

## 2021-03-22 MED ORDER — SODIUM CHLORIDE 0.9% FLUSH
10.0000 mL | INTRAVENOUS | Status: DC | PRN
  Administered 2021-03-22: 10 mL

## 2021-03-22 MED ORDER — ACETAMINOPHEN 325 MG PO TABS
650.0000 mg | ORAL_TABLET | Freq: Once | ORAL | Status: AC
  Administered 2021-03-22: 650 mg via ORAL
  Filled 2021-03-22: qty 2

## 2021-03-22 MED ORDER — DIPHENHYDRAMINE HCL 25 MG PO CAPS
25.0000 mg | ORAL_CAPSULE | Freq: Once | ORAL | Status: AC
  Administered 2021-03-22: 25 mg via ORAL
  Filled 2021-03-22: qty 1

## 2021-03-22 NOTE — Patient Instructions (Signed)
Blood Transfusion, Adult A blood transfusion is a procedure in which you receive blood or a type of blood cell (blood component) through an IV. You may need a blood transfusion when your blood level is low. This may result from a bleeding disorder, illness, injury, or surgery. The blood may come from a donor. You may also be able to donate blood for yourself (autologous blood donation) before a planned surgery. The blood given in a transfusion is made up of different blood components. You may receive: Red blood cells. These carry oxygen to the cells in the body. Platelets. These help your blood to clot. Plasma. This is the liquid part of your blood. It carries proteins and other substances throughout the body. White blood cells. These help you fight infections. If you have hemophilia or another clotting disorder, you may also receive other types of blood products. Tell a health care provider about: Any blood disorders you have. Any previous reactions you have had during a blood transfusion. Any allergies you have. All medicines you are taking, including vitamins, herbs, eye drops, creams, and over-the-counter medicines. Any surgeries you have had. Any medical conditions you have, including any recent fever or cold symptoms. Whether you are pregnant or may be pregnant. What are the risks? Generally, this is a safe procedure. However, problems may occur. The most common problems include: A mild allergic reaction, such as red, swollen areas of skin (hives) and itching. Fever or chills. This may be the body's response to new blood cells received. This may occur during or up to 4 hours after the transfusion. More serious problems may include: Transfusion-associated circulatory overload (TACO), or too much fluid in the lungs. This may cause breathing problems. A serious allergic reaction, such as difficulty breathing or swelling around the face and lips. Transfusion-related acute lung injury  (TRALI), which causes breathing difficulty and low oxygen in the blood. This can occur within hours of the transfusion or several days later. Iron overload. This can happen after receiving many blood transfusions over a period of time. Infection or virus being transmitted. This is rare because donated blood is carefully tested before it is given. Hemolytic transfusion reaction. This is rare. It happens when your body's defense system (immune system)tries to attack the new blood cells. Symptoms may include fever, chills, nausea, low blood pressure, and low back or chest pain. Transfusion-associated graft-versus-host disease (TAGVHD). This is rare. It happens when donated cells attack your body's healthy tissues. What happens before the procedure? Medicines Ask your health care provider about: Changing or stopping your regular medicines. This is especially important if you are taking diabetes medicines or blood thinners. Taking medicines such as aspirin and ibuprofen. These medicines can thin your blood. Do not take these medicines unless your health care provider tells you to take them. Taking over-the-counter medicines, vitamins, herbs, and supplements. General instructions Follow instructions from your health care provider about eating and drinking restrictions. You will have a blood test to determine your blood type. This is necessary to know what kind of blood your body will accept and to match it to the donor blood. If you are going to have a planned surgery, you may be able to do an autologous blood donation. This may be done in case you need to have a transfusion. You will have your temperature, blood pressure, and pulse monitored before the transfusion. If you have had an allergic reaction to a transfusion in the past, you may be given medicine to help prevent  a reaction. This medicine may be given to you by mouth (orally) or through an IV. Set aside time for the blood transfusion. This  procedure generally takes 1-4 hours to complete. What happens during the procedure?  An IV will be inserted into one of your veins. The bag of donated blood will be attached to your IV. The blood will then enter through your vein. Your temperature, blood pressure, and pulse will be monitored regularly during the transfusion. This monitoring is done to detect early signs of a transfusion reaction. Tell your nurse right away if you have any of these symptoms during the transfusion: Shortness of breath or trouble breathing. Chest or back pain. Fever or chills. Hives or itching. If you have any signs or symptoms of a reaction, your transfusion will be stopped and you may be given medicine. When the transfusion is complete, your IV will be removed. Pressure may be applied to the IV site for a few minutes. A bandage (dressing)will be applied. The procedure may vary among health care providers and hospitals. What happens after the procedure? Your temperature, blood pressure, pulse, breathing rate, and blood oxygen level will be monitored until you leave the hospital or clinic. Your blood may be tested to see how you are responding to the transfusion. You may be warmed with fluids or blankets to maintain a normal body temperature. If you receive your blood transfusion in an outpatient setting, you will be told whom to contact to report any reactions. Where to find more information For more information on blood transfusions, visit the American Red Cross: redcross.org Summary A blood transfusion is a procedure in which you receive blood or a type of blood cell (blood component) through an IV. The blood you receive may come from a donor or be donated by yourself (autologous blood donation) before a planned surgery. The blood given in a transfusion is made up of different blood components. You may receive red blood cells, platelets, plasma, or white blood cells depending on the condition treated. Your  temperature, blood pressure, and pulse will be monitored before, during, and after the transfusion. After the transfusion, your blood may be tested to see how your body has responded. This information is not intended to replace advice given to you by your health care provider. Make sure you discuss any questions you have with your health care provider. Document Revised: 01/14/2019 Document Reviewed: 09/03/2018 Elsevier Patient Education  [place]. Anemia Anemia is a condition in which there is not enough red blood cells or hemoglobin in the blood. Hemoglobin is a substance in red blood cells that carries oxygen. When you do not have enough red blood cells or hemoglobin (are anemic), your body cannot get enough oxygen and your organs may not work properly. As a result, you may feel very tired or have other problems. What are the causes? Common causes of anemia include: Excessive bleeding. Anemia can be caused by excessive bleeding inside or outside the body, including bleeding from the intestines or from heavy menstrual periods in females. Poor nutrition. Long-lasting (chronic) kidney, thyroid, and liver disease. Bone marrow disorders, spleen problems, and blood disorders. Cancer and treatments for cancer. HIV (human immunodeficiency virus) and AIDS (acquired immunodeficiency syndrome). Infections, medicines, and autoimmune disorders that destroy red blood cells. What are the signs or symptoms? Symptoms of this condition include: Minor weakness. Dizziness. Headache, or difficulties concentrating and sleeping. Heartbeats that feel irregular or faster than normal (palpitations). Shortness of breath, especially with exercise. Pale  skin, lips, and nails, or cold hands and feet. Indigestion and nausea. Symptoms may occur suddenly or develop slowly. If your anemia is mild, you may not have symptoms. How is this diagnosed? This condition is diagnosed based on blood tests, your medical  history, and a physical exam. In some cases, a test may be needed in which cells are removed from the soft tissue inside of a bone and looked at under a microscope (bone marrow biopsy). Your health care provider may also check your stool (feces) for blood and may do additional testing to look for the cause of your bleeding. Other tests may include: Imaging tests, such as a CT scan or MRI. A procedure to see inside your esophagus and stomach (endoscopy). A procedure to see inside your colon and rectum (colonoscopy). How is this treated? Treatment for this condition depends on the cause. If you continue to lose a lot of blood, you may need to be treated at a hospital. Treatment may include: Taking supplements of iron, vitamin N98, or folic acid. Taking a hormone medicine (erythropoietin) that can help to stimulate red blood cell growth. Having a blood transfusion. This may be needed if you lose a lot of blood. Making changes to your diet. Having surgery to remove your spleen. Follow these instructions at home: Take over-the-counter and prescription medicines only as told by your health care provider. Take supplements only as told by your health care provider. Follow any diet instructions that you were given by your health care provider. Keep all follow-up visits as told by your health care provider. This is important. Contact a health care provider if: You develop new bleeding anywhere in the body. Get help right away if: You are very weak. You are short of breath. You have pain in your abdomen or chest. You are dizzy or feel faint. You have trouble concentrating. You have bloody stools, black stools, or tarry stools. You vomit repeatedly or you vomit up blood. These symptoms may represent a serious problem that is an emergency. Do not wait to see if the symptoms will go away. Get medical help right away. Call your local emergency services (911 in the [place] Do not drive yourself to the  hospital. Summary Anemia is a condition in which you do not have enough red blood cells or enough of a substance in your red blood cells that carries oxygen (hemoglobin). Symptoms may occur suddenly or develop slowly. If your anemia is mild, you may not have symptoms. This condition is diagnosed with blood tests, a medical history, and a physical exam. Other tests may be needed. Treatment for this condition depends on the cause of the anemia. This information is not intended to replace advice given to you by your health care provider. Make sure you discuss any questions you have with your health care provider. Document Revised: 02/16/2019 Document Reviewed: 02/16/2019 Elsevier Patient Education  2022 [employer].

## 2021-03-23 LAB — BPAM RBC
Blood Product Expiration Date: 202301222359
ISSUE DATE / TIME: 202212291139
Unit Type and Rh: 600

## 2021-03-23 LAB — TYPE AND SCREEN
ABO/RH(D): A NEG
Antibody Screen: NEGATIVE
Unit division: 0

## 2021-03-27 ENCOUNTER — Encounter: Payer: Self-pay | Admitting: Oncology

## 2021-03-31 DIAGNOSIS — J449 Chronic obstructive pulmonary disease, unspecified: Secondary | ICD-10-CM | POA: Diagnosis not present

## 2021-04-02 NOTE — Progress Notes (Signed)
Sent in transportation request for DOS 04/11/21 and 04/12/21 to [employer].

## 2021-04-05 NOTE — Progress Notes (Signed)
Enrolled patient into co-pay assistance through [facility].  [employer] ID [id number] 03/06/2021 - 03/05/2022

## 2021-04-11 ENCOUNTER — Encounter: Payer: Self-pay | Admitting: Hematology and Oncology

## 2021-04-11 ENCOUNTER — Other Ambulatory Visit: Payer: Self-pay

## 2021-04-11 ENCOUNTER — Other Ambulatory Visit: Payer: Self-pay | Admitting: Hematology and Oncology

## 2021-04-11 ENCOUNTER — Inpatient Hospital Stay: Payer: Medicare Other | Attending: Hematology and Oncology | Admitting: Hematology and Oncology

## 2021-04-11 ENCOUNTER — Inpatient Hospital Stay: Payer: Medicare Other

## 2021-04-11 ENCOUNTER — Telehealth: Payer: Self-pay | Admitting: Hematology and Oncology

## 2021-04-11 DIAGNOSIS — E222 Syndrome of inappropriate secretion of antidiuretic hormone: Secondary | ICD-10-CM

## 2021-04-11 DIAGNOSIS — C3432 Malignant neoplasm of lower lobe, left bronchus or lung: Secondary | ICD-10-CM

## 2021-04-11 DIAGNOSIS — C787 Secondary malignant neoplasm of liver and intrahepatic bile duct: Secondary | ICD-10-CM | POA: Diagnosis not present

## 2021-04-11 DIAGNOSIS — D649 Anemia, unspecified: Secondary | ICD-10-CM | POA: Insufficient documentation

## 2021-04-11 DIAGNOSIS — C7951 Secondary malignant neoplasm of bone: Secondary | ICD-10-CM | POA: Insufficient documentation

## 2021-04-11 DIAGNOSIS — C7952 Secondary malignant neoplasm of bone marrow: Secondary | ICD-10-CM

## 2021-04-11 DIAGNOSIS — Z5112 Encounter for antineoplastic immunotherapy: Secondary | ICD-10-CM | POA: Diagnosis not present

## 2021-04-11 DIAGNOSIS — Z86711 Personal history of pulmonary embolism: Secondary | ICD-10-CM | POA: Insufficient documentation

## 2021-04-11 DIAGNOSIS — E871 Hypo-osmolality and hyponatremia: Secondary | ICD-10-CM

## 2021-04-11 DIAGNOSIS — C3490 Malignant neoplasm of unspecified part of unspecified bronchus or lung: Secondary | ICD-10-CM

## 2021-04-11 DIAGNOSIS — Z79899 Other long term (current) drug therapy: Secondary | ICD-10-CM | POA: Diagnosis not present

## 2021-04-11 DIAGNOSIS — C3411 Malignant neoplasm of upper lobe, right bronchus or lung: Secondary | ICD-10-CM | POA: Insufficient documentation

## 2021-04-11 DIAGNOSIS — Z7901 Long term (current) use of anticoagulants: Secondary | ICD-10-CM | POA: Insufficient documentation

## 2021-04-11 DIAGNOSIS — I2782 Chronic pulmonary embolism: Secondary | ICD-10-CM

## 2021-04-11 LAB — COMPREHENSIVE METABOLIC PANEL
Albumin: 4.1 (ref 3.5–5.0)
Calcium: 8.5 — AB (ref 8.7–10.7)

## 2021-04-11 LAB — CBC AND DIFFERENTIAL
HCT: 34 — AB (ref 36–46)
Hemoglobin: 11.1 — AB (ref 12.0–16.0)
Neutrophils Absolute: 3.97
Platelets: 222 (ref 150–399)
WBC: 6.1

## 2021-04-11 LAB — BASIC METABOLIC PANEL
BUN: 21 (ref 4–21)
CO2: 26 — AB (ref 13–22)
Chloride: 105 (ref 99–108)
Creatinine: 1 (ref 0.5–1.1)
Glucose: 118
Potassium: 4.2 (ref 3.4–5.3)
Sodium: 138 (ref 137–147)

## 2021-04-11 LAB — HEPATIC FUNCTION PANEL
ALT: 20 (ref 7–35)
AST: 29 (ref 13–35)
Alkaline Phosphatase: 89 (ref 25–125)
Bilirubin, Total: 0.6

## 2021-04-11 LAB — TSH: TSH: 2.103 u[IU]/mL (ref 0.350–4.500)

## 2021-04-11 LAB — CBC
MCV: 94 (ref 81–99)
RBC: 3.61 — AB (ref 3.87–5.11)

## 2021-04-11 MED ORDER — APIXABAN 5 MG PO TABS: 5.0000 mg | ORAL_TABLET | Freq: Two times a day (BID) | ORAL | 5 refills | Status: DC

## 2021-04-11 MED FILL — Atezolizumab IV Soln 1200 MG/20ML: INTRAVENOUS | Qty: 20 | Status: AC

## 2021-04-11 NOTE — Assessment & Plan Note (Signed)
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 She was placed on zoledronic acid in March2022. She developed severe hypocalcemia with her 1st dose and we have not resumed this yet.

## 2021-04-11 NOTE — Assessment & Plan Note (Signed)
Sodium is normal today at 138. She has discontinued home supplements.

## 2021-04-11 NOTE — Progress Notes (Signed)
Patient Care Team: Helen Hashimoto., MD as PCP - General (Internal Medicine) Gardiner Rhyme, MD as Attending Physician (Pulmonary Disease) Governor Rooks., DO as Attending Physician (General Surgery)  Clinic Day:  04/11/2021  Referring physician: Helen Hashimoto., MD  ASSESSMENT & PLAN:   Assessment & Plan: Small cell carcinoma of lower lobe of left lung ([place]) Small cell carcinoma of the lung, at least a clinical stage IIIB, diagnosed in October 2021.   She was initially treated with chemotherapy with carboplatin/etoposide with a good response.  She was then switched to nivolumab. However, she was found to have recent progression on disease in August 2022.  She underwent palliative radiation therapy, which she completed in August. She is now receiving palliative chemotherapy consisting of carboplatin/etoposide/atezolizumab and completed 4 cycles. CT imaging from December has shown a positive response. We will now proceed with maintenance atezolizumab tomorrow. Of note, she has been without her Eliquis for 2 weeks due to cost. Mort Sawyers, SW will evaluate today for assistance. She will return to clinic in 3 weeks for repeat evaluation.   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 She was placed on zoledronic acid in March 2022.  She developed severe hypocalcemia with her 1st dose and we have not resumed this yet.  Hyponatremia Sodium is normal today at 138. She has discontinued home supplements.   Hypocalcemia Calcium is normal at 8.5 today.   The patient understands the plans discussed today and is in agreement with them.  She knows to contact our office if she develops concerns prior to her next appointment.     [name], NP  [facility] [address] [place] [address] Dept: [phone] Dept Fax: [phone]   No orders of the defined types were placed in this encounter.     CHIEF COMPLAINT:  CC: A 81 year old female with history of lung cancer here for 3 week evaluation.  Current Treatment:  Maintenance atezolizumab  INTERVAL HISTORY:  Gabrielle Hudson is here today for repeat clinical assessment. She denies fevers or chills. She denies pain. Her appetite is good. Her weight has been stable.  I have reviewed the past medical history, past surgical history, social history and family history with the patient and they are unchanged from previous note.  ALLERGIES:  is allergic to nitroglycerin in d5w and nitroglycerin.  MEDICATIONS:  Current Outpatient Medications  Medication Sig Dispense Refill   albuterol (PROVENTIL) (2.5 MG/3ML) 0.083% nebulizer solution SMARTSIG:1 Vial(s) Via Nebulizer 4 Times Daily PRN     albuterol (VENTOLIN HFA) 108 (90 Base) MCG/ACT inhaler Inhale into the lungs every 6 (six) hours as needed for wheezing or shortness of breath.     apixaban (ELIQUIS) 5 MG TABS tablet Take 1 tablet (5 mg total) by mouth 2 (two) times daily. (Patient not taking: Reported on 04/11/2021) 60 tablet 5   budesonide-formoterol (SYMBICORT) 160-4.5 MCG/ACT inhaler Inhale 2 puffs into the lungs 2 (two) times daily.       calcium citrate-vitamin D (CITRACAL+D) 315-200 MG-UNIT tablet Take 1 tablet by mouth in the morning, at noon, and at bedtime.     Cholecalciferol (VITAMIN D3) 1.25 MG (50000 UT) CAPS Take 1.25 mg by mouth once a week. 12 capsule 3   cyclobenzaprine (FLEXERIL) 10 MG tablet Take 1  tablet (10 mg total) by mouth 3 (three) times daily as needed for muscle spasms. 30 tablet 3   diltiazem (CARDIZEM CD) 120 MG 24 hr capsule Take 120 mg by mouth daily.     famotidine (PEPCID) 20 MG tablet Take 20 mg by mouth 2 (two) times daily.     furosemide (LASIX) 20 MG tablet Take 20 mg by mouth as needed. ONLY TAKES IT HAVING  SWELLING, can take 2 tablets it more than a 3 lb weight gain ever night     gabapentin (NEURONTIN) 300 MG capsule Take 1 capsule (300 mg total) by mouth 2 (two) times daily. 60 capsule 4   HYDROcodone-acetaminophen (NORCO/VICODIN) 5-325 MG tablet Take 1 tablet by mouth every 4 (four) hours as needed for moderate pain. 30 tablet 0   levothyroxine (SYNTHROID) 100 MCG tablet Take 1 tablet (100 mcg total) by mouth daily before breakfast. 30 tablet 5   loratadine (CLARITIN) 10 MG tablet Take 10 mg by mouth daily.     meclizine (ANTIVERT) 25 MG tablet Take 0.5-1 tablets (12.5-25 mg total) by mouth 3 (three) times daily as needed for dizziness. 120 tablet 3   OXYGEN Inhale 3 L into the lungs continuous.     Potassium Chloride ER 20 MEQ TBCR Take 1 tablet by mouth 2 (two) times daily. 60 tablet 5   rOPINIRole (REQUIP) 0.5 MG tablet Take 0.5 mg by mouth at bedtime.     sodium chloride 1 g tablet Take 1 g by mouth daily.     No current facility-administered medications for this visit.    HISTORY OF PRESENT ILLNESS:   Oncology History  Small cell carcinoma of lower lobe of left lung ([place])  01/08/2020 Imaging   CTA CHEST WITH CONTRAST: No evidence of pulmonary embolus.   Tumor encases and narrows the left lower lobe pulmonary artery in the inferior left hilum measuring up to 5.2 cm. This is increased from 1.9 cm previously.   Irregular calcified and noncalcified plaque in the descending thoracic aorta. Coronary artery disease.   Left lower lobe atelectasis.   Aortic Atherosclerosis (ICD10-I70.0) and Emphysema (ICD10-J43.9).    01/11/2020 Pathology Results   LEFT LOWER LOBE LUNG MASS, BIOPSY:               SMALL CELL CARCINOMA WITH CRUSH ARTIFACT   01/20/2020 Initial Diagnosis   Lung cancer, lower lobe ([place])   01/21/2020 Cancer Staging   Staging form: Lung, AJCC 8th Edition - Pathologic stage from 01/21/2020: Stage IIIB (pT3, pN2, cM0) - Signed by Derwood Kaplan, MD on 01/21/2020     01/31/2020 - 04/28/2020 Chemotherapy   Carboplatin/etoposide        06/08/2020 - 10/13/2020 Chemotherapy   Nivolumab        08/28/2020 Imaging   CT CHEST, ABDOMEN AND PELVIS WITHOUT CONTRAST: 1. Progressive soft tissue thickening between the aorta and the left mainstem bronchus suspicious for recurrent tumor. PET-CT may be helpful for further evaluation if clinically indicated. 2. Stable radiation changes involving the right hilum and right lower lobe. 3. Stable severe emphysematous changes and pulmonary scarring. 4. Stable right hepatic lobe lesion. 5. Stable scattered sclerotic bone lesions. 6. Stable advanced atherosclerotic calcifications involving the thoracic and abdominal aorta and branch vessels including the coronary arteries. 7. Chronic bilateral hip AVN. 8. Emphysema and aortic atherosclerosis.   10/15/2020 Imaging   CTA CHEST WITH CONTRAST: 1. No evidence of a pulmonary embolism. 2. No acute findings. 3. Soft tissue mass adjacent to the  left peripheral mainstem bronchus has increased in size from the prior CT concerning for recurrence or progression of carcinoma. 4. No other change. 5. Advanced emphysema, coronary artery calcifications and aortic atherosclerosis.   12/22/2020 Imaging   CT CHEST, ABDOMEN AND PELVIS WITH CONTRAST: 1. Interval mixed response to therapy. 2. There has been interval resolution of left hilar adenopathy. Masslike architectural distortion within the superior segment of right lower lobe is slightly decreased in size in the interval. 3. Interval enlargement of subcarinal lymph node concerning for metastatic adenopathy. 4. There are several new, low-attenuation lesions within both lobes of liver. Worrisome for metastatic disease. 5. Stable appearance of multifocal sclerotic bone metastases. 6. Aortic Atherosclerosis (ICD10-I70.0) and Emphysema (ICD10-J43.9).   12/25/2020 -  Chemotherapy   Patient is on Treatment Plan : LUNG SCLC  Carboplatin + Etoposide + Atezolizumab Induction q21d / Atezolizumab Maintenance q21d     03/20/2021 Imaging   CT CHEST, ABDOMEN AND PELVIS WITH CONTRAST: Stable post treatment changes in the right hilum and superior RIGHT  mediastinal border.  Decreased size of subcarinal lymph node.  Stable small bilateral pulmonary nodules.  Decreased size of the dominant hepatic lesion, smaller hepatic  lesion seen on the prior study is no longer visible on today's exam.  No change in the appearance of multifocal areas of bony metastatic  disease with sclerosis.  Bilateral femoral AVN of the femoral heads.  Emphysema and aortic atherosclerosis.  Aortic Atherosclerosis (ICD10-I70.0) and Emphysema (ICD10-J43.9).    Secondary malignant neoplasm of bone and bone marrow ([place])  05/29/2020 Imaging   CT CHEST, ABDOMEN AND PELVIS WITH CONTRAST: 1. New osseous metastatic disease. 2. Post radiation changes in the right hemithorax, stable. 3. Aortic atherosclerosis (ICD10-I70.0). Coronary artery calcification. 4. Emphysema (ICD10-J43.9).       REVIEW OF SYSTEMS:   Constitutional: Denies fevers, chills or abnormal weight loss Eyes: Denies blurriness of vision Ears, nose, mouth, throat, and face: Denies mucositis or sore throat Respiratory: Denies cough, dyspnea or wheezes Cardiovascular: Denies palpitation, chest discomfort or lower extremity swelling Gastrointestinal:  Denies nausea, heartburn or change in bowel habits Skin: Denies abnormal skin rashes Lymphatics: Denies new lymphadenopathy or easy bruising Neurological:Denies numbness, tingling or new weaknesses Behavioral/Psych: Mood is stable, no new changes  All other systems were reviewed with the patient and are negative.   VITALS:  Blood pressure (!) 169/80, pulse 96, temperature 97.9 F (36.6 C), temperature source Oral, resp. rate 18, height 5\' 3"  (1.6 m), weight 219 lb (99.3 kg), SpO2 99 %.  Wt Readings from Last 3 Encounters:  04/11/21  219 lb (99.3 kg)  03/22/21 221 lb (100.2 kg)  03/21/21 220 lb 14.4 oz (100.2 kg)    Body mass index is 38.79 kg/m.  Performance status (ECOG): 2 - Symptomatic, <50% confined to bed  PHYSICAL EXAM:   GENERAL:alert, no distress and comfortable SKIN: skin color, texture, turgor are normal, no rashes or significant lesions EYES: normal, Conjunctiva are pink and non-injected, sclera clear OROPHARYNX:no exudate, no erythema and lips, buccal mucosa, and tongue normal  NECK: supple, thyroid normal size, non-tender, without nodularity LYMPH:  no palpable lymphadenopathy in the cervical, axillary or inguinal LUNGS: clear to auscultation and percussion with normal breathing effort HEART: regular rate & rhythm and no murmurs and no lower extremity edema ABDOMEN:abdomen soft, non-tender and normal bowel sounds Musculoskeletal:no cyanosis of digits and no clubbing  NEURO: alert & oriented x 3 with fluent speech, no focal motor/sensory deficits  LABORATORY DATA:  I have reviewed the  data as listed    Component Value Date/Time   NA 138 04/11/2021 0000   K 4.2 04/11/2021 0000   CL 105 04/11/2021 0000   CO2 26 (A) 04/11/2021 0000   GLUCOSE 111 (H) 08/30/2020 1533   BUN 21 04/11/2021 0000   CREATININE 1.0 04/11/2021 0000   CREATININE 1.02 (H) 08/30/2020 1533   CALCIUM 8.5 (A) 04/11/2021 0000   PROT 6.6 08/30/2020 1533   ALBUMIN 4.1 04/11/2021 0000   AST 29 04/11/2021 0000   AST 13 (L) 08/30/2020 1533   ALT 20 04/11/2021 0000   ALT 12 08/30/2020 1533   ALKPHOS 89 04/11/2021 0000   BILITOT 0.1 (L) 08/30/2020 1533   GFRNONAA 56 (L) 08/30/2020 1533   GFRAA 24 (L) 04/01/2011 0349    No results found for: SPEP, UPEP  Lab Results  Component Value Date   WBC 6.1 04/11/2021   NEUTROABS 3.97 04/11/2021   HGB 11.1 (A) 04/11/2021   HCT 34 (A) 04/11/2021   MCV 94 04/11/2021   PLT 222 04/11/2021      Chemistry      Component Value Date/Time   NA 138 04/11/2021 0000   K 4.2 04/11/2021  0000   CL 105 04/11/2021 0000   CO2 26 (A) 04/11/2021 0000   BUN 21 04/11/2021 0000   CREATININE 1.0 04/11/2021 0000   CREATININE 1.02 (H) 08/30/2020 1533   GLU 118 04/11/2021 0000      Component Value Date/Time   CALCIUM 8.5 (A) 04/11/2021 0000   ALKPHOS 89 04/11/2021 0000   AST 29 04/11/2021 0000   AST 13 (L) 08/30/2020 1533   ALT 20 04/11/2021 0000   ALT 12 08/30/2020 1533   BILITOT 0.1 (L) 08/30/2020 1533       RADIOGRAPHIC STUDIES: I have personally reviewed the radiological images as listed and agreed with the findings in the report. No results found.

## 2021-04-11 NOTE — Assessment & Plan Note (Signed)
Calcium is normal at 8.5 today.

## 2021-04-11 NOTE — Assessment & Plan Note (Addendum)
Small cell carcinoma of the lung, at least a clinical stage IIIB, diagnosed in October 2021. She was initially treated with chemotherapy with carboplatin/etoposide with a good response. She was then switched to nivolumab.However, shewas found to have recent progression on disease in August 2022. She underwent palliative radiation therapy, which she completed in August.She is now receiving palliativechemotherapy consisting of carboplatin/etoposide/atezolizumab and completed 4 cycles. CT imaging from December has shown a positive response. We will now proceed with maintenance atezolizumab tomorrow. Of note, she has been without her Eliquis for 2 weeks due to cost. Mort Sawyers, SW will evaluate today for assistance. She will return to clinic in 3 weeks for repeat evaluation.

## 2021-04-11 NOTE — Telephone Encounter (Signed)
Patient has been scheduled for follow-up visit per 04/11/21 los. Pt given an appt calendar with date and time.

## 2021-04-11 NOTE — Progress Notes (Signed)
Patients Eliquis is going to be $600+ this month. Helped her get the free 30 day trial offer and called and gave [facility] in [place] the information. Also had her sign paperwork for free medication through [employer], will fax this in after doctor signs.

## 2021-04-12 ENCOUNTER — Inpatient Hospital Stay: Payer: Medicare Other

## 2021-04-12 VITALS — BP 145/64 | HR 92 | Temp 98.1°F | Resp 18 | Ht 63.0 in | Wt 221.5 lb

## 2021-04-12 DIAGNOSIS — E222 Syndrome of inappropriate secretion of antidiuretic hormone: Secondary | ICD-10-CM

## 2021-04-12 DIAGNOSIS — C7951 Secondary malignant neoplasm of bone: Secondary | ICD-10-CM | POA: Diagnosis not present

## 2021-04-12 DIAGNOSIS — E871 Hypo-osmolality and hyponatremia: Secondary | ICD-10-CM

## 2021-04-12 DIAGNOSIS — Z79899 Other long term (current) drug therapy: Secondary | ICD-10-CM | POA: Diagnosis not present

## 2021-04-12 DIAGNOSIS — C3432 Malignant neoplasm of lower lobe, left bronchus or lung: Secondary | ICD-10-CM

## 2021-04-12 DIAGNOSIS — Z86711 Personal history of pulmonary embolism: Secondary | ICD-10-CM | POA: Diagnosis not present

## 2021-04-12 DIAGNOSIS — Z7901 Long term (current) use of anticoagulants: Secondary | ICD-10-CM | POA: Diagnosis not present

## 2021-04-12 DIAGNOSIS — C787 Secondary malignant neoplasm of liver and intrahepatic bile duct: Secondary | ICD-10-CM | POA: Diagnosis not present

## 2021-04-12 DIAGNOSIS — C3411 Malignant neoplasm of upper lobe, right bronchus or lung: Secondary | ICD-10-CM | POA: Diagnosis not present

## 2021-04-12 DIAGNOSIS — D649 Anemia, unspecified: Secondary | ICD-10-CM | POA: Diagnosis not present

## 2021-04-12 DIAGNOSIS — Z5112 Encounter for antineoplastic immunotherapy: Secondary | ICD-10-CM | POA: Diagnosis not present

## 2021-04-12 LAB — T4: T4, Total: 8.7 ug/dL (ref 4.5–12.0)

## 2021-04-12 LAB — CEA: CEA: 7.7 ng/mL — ABNORMAL HIGH (ref 0.0–4.7)

## 2021-04-12 MED ORDER — HEPARIN SOD (PORK) LOCK FLUSH 100 UNIT/ML IV SOLN
500.0000 [IU] | Freq: Once | INTRAVENOUS | Status: AC | PRN
  Administered 2021-04-12: 500 [IU]

## 2021-04-12 MED ORDER — SODIUM CHLORIDE 0.9% FLUSH
10.0000 mL | INTRAVENOUS | Status: DC | PRN
  Administered 2021-04-12: 10 mL

## 2021-04-12 MED ORDER — SODIUM CHLORIDE 0.9 % IV SOLN
1200.0000 mg | Freq: Once | INTRAVENOUS | Status: AC
  Administered 2021-04-12: 1200 mg via INTRAVENOUS
  Filled 2021-04-12: qty 20

## 2021-04-12 MED ORDER — SODIUM CHLORIDE 0.9 % IV SOLN: Freq: Once | INTRAVENOUS | Status: AC

## 2021-04-12 NOTE — Progress Notes (Signed)
1414:PT STABLE AT TIME OF DISCHARGE

## 2021-04-12 NOTE — Patient Instructions (Signed)
[facility]  Discharge Instructions: Thank you for choosing [facility] to provide your oncology and hematology care.  If you have a lab appointment with the [facility], please go directly to the [facility] and check in at the registration area.   Wear comfortable clothing and clothing appropriate for easy access to any Portacath or PICC line.   We strive to give you quality time with your provider. You may need to reschedule your appointment if you arrive late (15 or more minutes).  Arriving late affects you and other patients whose appointments are after yours.  Also, if you miss three or more appointments without notifying the office, you may be dismissed from the clinic at the providers discretion.      For prescription refill requests, have your pharmacy contact our office and allow 72 hours for refills to be completed.    Today you received the following chemotherapy and/or immunotherapy agents atezolizumab    To help prevent nausea and vomiting after your treatment, we encourage you to take your nausea medication as directed.  BELOW ARE SYMPTOMS THAT SHOULD BE REPORTED IMMEDIATELY: *FEVER GREATER THAN 100.4 F (38 C) OR HIGHER *CHILLS OR SWEATING *NAUSEA AND VOMITING THAT IS NOT CONTROLLED WITH YOUR NAUSEA MEDICATION *UNUSUAL SHORTNESS OF BREATH *UNUSUAL BRUISING OR BLEEDING *URINARY PROBLEMS (pain or burning when urinating, or frequent urination) *BOWEL PROBLEMS (unusual diarrhea, constipation, pain near the anus) TENDERNESS IN MOUTH AND THROAT WITH OR WITHOUT PRESENCE OF ULCERS (sore throat, sores in mouth, or a toothache) UNUSUAL RASH, SWELLING OR PAIN  UNUSUAL VAGINAL DISCHARGE OR ITCHING   Items with * indicate a potential emergency and should be followed up as soon as possible or go to the Emergency Department if any problems should occur.  Please show the CHEMOTHERAPY ALERT CARD or IMMUNOTHERAPY ALERT CARD at check-in to the  Emergency Department and triage nurse.  Should you have questions after your visit or need to cancel or reschedule your appointment, please contact [facility]  Dept: [phone]  and follow the prompts.  Office hours are 8:00 a.m. to 4:30 p.m. Monday - Friday. Please note that voicemails left after 4:00 p.m. may not be returned until the following business day.  We are closed weekends and major holidays. You have access to a nurse at all times for urgent questions. Please call the main number to the clinic Dept: [phone] and follow the prompts.  For any non-urgent questions, you may also contact your provider using MyChart. We now offer e-Visits for anyone 39 and older to request care online for non-urgent symptoms. For details visit [email or web address].   Also download the MyChart app! Go to the app store, search "MyChart", open the app, select [facility], and log in with your MyChart username and password.  Due to Covid, a mask is required upon entering the hospital/clinic. If you do not have a mask, one will be given to you upon arrival. For doctor visits, patients may have 1 support person aged 21 or older with them. For treatment visits, patients cannot have anyone with them due to current Covid guidelines and our immunocompromised population.

## 2021-04-13 NOTE — Progress Notes (Signed)
Enrolled patient into the [employer], [employer] know we would cover the cost of patients Eliquis. We could not use the 30 day free trial offer since patient has already started medication. Will fax in application for free Eliquis today.

## 2021-04-20 ENCOUNTER — Encounter: Payer: Self-pay | Admitting: Oncology

## 2021-04-23 NOTE — Progress Notes (Signed)
Sent in request for DOS 05/02/2021 and 05/03/2021 to [employer].

## 2021-05-01 DIAGNOSIS — J449 Chronic obstructive pulmonary disease, unspecified: Secondary | ICD-10-CM | POA: Diagnosis not present

## 2021-05-02 ENCOUNTER — Inpatient Hospital Stay: Payer: Medicare Other | Attending: Hematology and Oncology

## 2021-05-02 ENCOUNTER — Encounter: Payer: Self-pay | Admitting: Hematology and Oncology

## 2021-05-02 ENCOUNTER — Other Ambulatory Visit: Payer: Self-pay

## 2021-05-02 ENCOUNTER — Telehealth: Payer: Self-pay | Admitting: Hematology and Oncology

## 2021-05-02 ENCOUNTER — Inpatient Hospital Stay (INDEPENDENT_AMBULATORY_CARE_PROVIDER_SITE_OTHER): Payer: Medicare Other | Admitting: Hematology and Oncology

## 2021-05-02 DIAGNOSIS — C7951 Secondary malignant neoplasm of bone: Secondary | ICD-10-CM | POA: Insufficient documentation

## 2021-05-02 DIAGNOSIS — C7952 Secondary malignant neoplasm of bone marrow: Secondary | ICD-10-CM | POA: Diagnosis not present

## 2021-05-02 DIAGNOSIS — C3432 Malignant neoplasm of lower lobe, left bronchus or lung: Secondary | ICD-10-CM

## 2021-05-02 DIAGNOSIS — C787 Secondary malignant neoplasm of liver and intrahepatic bile duct: Secondary | ICD-10-CM | POA: Insufficient documentation

## 2021-05-02 DIAGNOSIS — Z86711 Personal history of pulmonary embolism: Secondary | ICD-10-CM | POA: Insufficient documentation

## 2021-05-02 DIAGNOSIS — E871 Hypo-osmolality and hyponatremia: Secondary | ICD-10-CM

## 2021-05-02 DIAGNOSIS — D649 Anemia, unspecified: Secondary | ICD-10-CM | POA: Diagnosis not present

## 2021-05-02 DIAGNOSIS — Z5112 Encounter for antineoplastic immunotherapy: Secondary | ICD-10-CM | POA: Insufficient documentation

## 2021-05-02 DIAGNOSIS — Z79899 Other long term (current) drug therapy: Secondary | ICD-10-CM | POA: Insufficient documentation

## 2021-05-02 DIAGNOSIS — E222 Syndrome of inappropriate secretion of antidiuretic hormone: Secondary | ICD-10-CM

## 2021-05-02 DIAGNOSIS — Z7901 Long term (current) use of anticoagulants: Secondary | ICD-10-CM | POA: Diagnosis not present

## 2021-05-02 DIAGNOSIS — C3490 Malignant neoplasm of unspecified part of unspecified bronchus or lung: Secondary | ICD-10-CM

## 2021-05-02 DIAGNOSIS — C3411 Malignant neoplasm of upper lobe, right bronchus or lung: Secondary | ICD-10-CM | POA: Insufficient documentation

## 2021-05-02 LAB — HEPATIC FUNCTION PANEL
ALT: 14 (ref 7–35)
AST: 21 (ref 13–35)
Alkaline Phosphatase: 95 (ref 25–125)
Bilirubin, Total: 0.5

## 2021-05-02 LAB — BASIC METABOLIC PANEL
BUN: 15 (ref 4–21)
CO2: 26 — AB (ref 13–22)
Chloride: 104 (ref 99–108)
Creatinine: 0.9 (ref 0.5–1.1)
Glucose: 108
Potassium: 4.1 (ref 3.4–5.3)
Sodium: 140 (ref 137–147)

## 2021-05-02 LAB — CBC AND DIFFERENTIAL
HCT: 36 (ref 36–46)
Hemoglobin: 11.7 — AB (ref 12.0–16.0)
Neutrophils Absolute: 3.2
Platelets: 223 (ref 150–399)
WBC: 5

## 2021-05-02 LAB — COMPREHENSIVE METABOLIC PANEL
Albumin: 4.1 (ref 3.5–5.0)
Calcium: 8.8 (ref 8.7–10.7)

## 2021-05-02 LAB — CBC: RBC: 3.91 (ref 3.87–5.11)

## 2021-05-02 LAB — TSH: TSH: 2.321 u[IU]/mL (ref 0.350–4.500)

## 2021-05-02 MED FILL — Atezolizumab IV Soln 1200 MG/20ML: INTRAVENOUS | Qty: 20 | Status: AC

## 2021-05-02 NOTE — Assessment & Plan Note (Signed)
Small cell carcinoma of the lung, at least a clinical stage IIIB, diagnosed in October 2021. She was initially treated with chemotherapy with carboplatin/etoposide with a good response. She was then switched to nivolumab.However, shewas found to have recent progression on disease in August 2022. She underwent palliative radiation therapy, which she completed in August.She is now receiving palliativechemotherapy consisting of carboplatin/etoposide/atezolizumab and completed 4 cycles. CT imaging from December has shown a positive response. We will now proceed with maintenance atezolizumab tomorrow. She will return to clinic in 3 weeks for repeat evaluation.

## 2021-05-02 NOTE — Telephone Encounter (Signed)
Per 05/02/21 los next appt scheduled and confirmed with patient

## 2021-05-02 NOTE — Progress Notes (Signed)
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   She was initially treated with chemotherapy with carboplatin/etoposide with a good response.  She was then switched to nivolumab. However, she was found to have recent progression on disease in August 2022.  She underwent palliative radiation therapy, which she completed in August. She is now receiving palliative chemotherapy consisting of carboplatin/etoposide/atezolizumab and completed 4 cycles. CT imaging from December has shown a positive response. We will now proceed with maintenance atezolizumab tomorrow. She will return to clinic in 3 weeks for repeat evaluation.   Hyponatremia Sodium is normal today at 138. She has discontinued home supplements.   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 She was placed on zoledronic acid in March 2022.  She developed severe hypocalcemia with her 1st dose and we have not resumed this yet.    The patient understands the plans discussed today and is in agreement with them.  She knows to contact our office if she develops concerns prior to her next appointment.    [name], NP  [facility] [address] [place] [address] Dept: [phone] Dept Fax: [phone]   Orders Placed This Encounter  Procedures   CBC and differential    This external  order was created through the Results Console.   CBC    This external order was created through the Results Console.      CHIEF COMPLAINT:  CC: A 81 year old female here with history of lung cancer here for 3 week evaluation  Current Treatment:  Atezolizumab  INTERVAL HISTORY:  Gabrielle Hudson is here today for repeat clinical assessment. She denies fevers or chills. She denies pain. Her appetite is good. Her weight has been stable.  I have reviewed the past medical history, past surgical history, social history and family history with the patient and they are unchanged from previous note.  ALLERGIES:  is allergic to nitroglycerin in d5w and nitroglycerin.  MEDICATIONS:  Current Outpatient Medications  Medication Sig Dispense Refill   albuterol (PROVENTIL) (2.5 MG/3ML) 0.083% nebulizer solution SMARTSIG:1 Vial(s) Via Nebulizer 4 Times Daily PRN     albuterol (VENTOLIN HFA) 108 (90 Base) MCG/ACT inhaler Inhale into the lungs every 6 (six) hours as needed for wheezing or shortness of breath.     apixaban (ELIQUIS) 5 MG TABS tablet Take 1 tablet (5 mg total) by mouth 2 (two) times daily. 60 tablet 5   budesonide-formoterol (SYMBICORT) 160-4.5 MCG/ACT inhaler Inhale 2 puffs into the lungs 2 (two) times daily.       calcium citrate-vitamin D (CITRACAL+D) 315-200 MG-UNIT tablet Take 1 tablet by mouth in the morning, at noon, and at bedtime.     Cholecalciferol (VITAMIN D3) 1.25 MG (50000 UT) CAPS Take 1.25 mg by mouth once a week. 12 capsule 3   cyclobenzaprine (FLEXERIL) 10 MG tablet Take 1 tablet (10 mg total) by mouth 3 (three)  times daily as needed for muscle spasms. 30 tablet 3   diltiazem (CARDIZEM CD) 120 MG 24 hr capsule Take 120 mg by mouth daily.     famotidine (PEPCID) 20 MG tablet Take 20 mg by mouth 2 (two) times daily.     furosemide (LASIX) 20 MG tablet Take 20 mg by mouth as needed. ONLY TAKES IT HAVING SWELLING, can take 2 tablets it more than a 3 lb weight gain ever night      gabapentin (NEURONTIN) 300 MG capsule Take 1 capsule (300 mg total) by mouth 2 (two) times daily. 60 capsule 4   HYDROcodone-acetaminophen (NORCO/VICODIN) 5-325 MG tablet Take 1 tablet by mouth every 4 (four) hours as needed for moderate pain. 30 tablet 0   levothyroxine (SYNTHROID) 100 MCG tablet Take 1 tablet (100 mcg total) by mouth daily before breakfast. 30 tablet 5   loratadine (CLARITIN) 10 MG tablet Take 10 mg by mouth daily.     meclizine (ANTIVERT) 25 MG tablet Take 0.5-1 tablets (12.5-25 mg total) by mouth 3 (three) times daily as needed for dizziness. 120 tablet 3   OXYGEN Inhale 3 L into the lungs continuous.     Potassium Chloride ER 20 MEQ TBCR Take 1 tablet by mouth 2 (two) times daily. 60 tablet 5   rOPINIRole (REQUIP) 0.5 MG tablet Take 0.5 mg by mouth at bedtime.     No current facility-administered medications for this visit.    HISTORY OF PRESENT ILLNESS:   Oncology History  Small cell carcinoma of lower lobe of left lung ([facility])  01/08/2020 Imaging   CTA CHEST WITH CONTRAST: No evidence of pulmonary embolus.   Tumor encases and narrows the left lower lobe pulmonary artery in the inferior left hilum measuring up to 5.2 cm. This is increased from 1.9 cm previously.   Irregular calcified and noncalcified plaque in the descending thoracic aorta. Coronary artery disease.   Left lower lobe atelectasis.   Aortic Atherosclerosis (ICD10-I70.0) and Emphysema (ICD10-J43.9).    01/11/2020 Pathology Results   LEFT LOWER LOBE LUNG MASS, BIOPSY:               SMALL CELL CARCINOMA WITH CRUSH ARTIFACT   01/20/2020 Initial Diagnosis   Lung cancer, lower lobe ([facility])   01/21/2020 Cancer Staging   Staging form: Lung, AJCC 8th Edition - Pathologic stage from 01/21/2020: Stage IIIB (pT3, pN2, cM0) - Signed by Derwood Kaplan, MD on 01/21/2020    01/31/2020 - 04/28/2020 Chemotherapy   Carboplatin/etoposide        06/08/2020 - 10/13/2020 Chemotherapy   Nivolumab         08/28/2020 Imaging   CT CHEST, ABDOMEN AND PELVIS WITHOUT CONTRAST: 1. Progressive soft tissue thickening between the aorta and the left mainstem bronchus suspicious for recurrent tumor. PET-CT may be helpful for further evaluation if clinically indicated. 2. Stable radiation changes involving the right hilum and right lower lobe. 3. Stable severe emphysematous changes and pulmonary scarring. 4. Stable right hepatic lobe lesion. 5. Stable scattered sclerotic bone lesions. 6. Stable advanced atherosclerotic calcifications involving the thoracic and abdominal aorta and branch vessels including the coronary arteries. 7. Chronic bilateral hip AVN. 8. Emphysema and aortic atherosclerosis.   10/15/2020 Imaging   CTA CHEST WITH CONTRAST: 1. No evidence of a pulmonary embolism. 2. No acute findings. 3. Soft tissue mass adjacent to the left peripheral mainstem bronchus has increased in size from the prior CT concerning for recurrence or progression of carcinoma. 4. No other change.  5. Advanced emphysema, coronary artery calcifications and aortic atherosclerosis.   12/22/2020 Imaging   CT CHEST, ABDOMEN AND PELVIS WITH CONTRAST: 1. Interval mixed response to therapy. 2. There has been interval resolution of left hilar adenopathy. Masslike architectural distortion within the superior segment of right lower lobe is slightly decreased in size in the interval. 3. Interval enlargement of subcarinal lymph node concerning for metastatic adenopathy. 4. There are several new, low-attenuation lesions within both lobes of liver. Worrisome for metastatic disease. 5. Stable appearance of multifocal sclerotic bone metastases. 6. Aortic Atherosclerosis (ICD10-I70.0) and Emphysema (ICD10-J43.9).   12/25/2020 -  Chemotherapy   Patient is on Treatment Plan : LUNG SCLC Carboplatin + Etoposide + Atezolizumab Induction q21d / Atezolizumab Maintenance q21d     03/20/2021 Imaging   CT CHEST, ABDOMEN AND  PELVIS WITH CONTRAST: Stable post treatment changes in the right hilum and superior RIGHT  mediastinal border.  Decreased size of subcarinal lymph node.  Stable small bilateral pulmonary nodules.  Decreased size of the dominant hepatic lesion, smaller hepatic  lesion seen on the prior study is no longer visible on today's exam.  No change in the appearance of multifocal areas of bony metastatic  disease with sclerosis.  Bilateral femoral AVN of the femoral heads.  Emphysema and aortic atherosclerosis.  Aortic Atherosclerosis (ICD10-I70.0) and Emphysema (ICD10-J43.9).    Secondary malignant neoplasm of bone and bone marrow ([facility])  05/29/2020 Imaging   CT CHEST, ABDOMEN AND PELVIS WITH CONTRAST: 1. New osseous metastatic disease. 2. Post radiation changes in the right hemithorax, stable. 3. Aortic atherosclerosis (ICD10-I70.0). Coronary artery calcification. 4. Emphysema (ICD10-J43.9).       REVIEW OF SYSTEMS:   Constitutional: Denies fevers, chills or abnormal weight loss Eyes: Denies blurriness of vision Ears, nose, mouth, throat, and face: Denies mucositis or sore throat Respiratory: Denies cough, dyspnea or wheezes Cardiovascular: Denies palpitation, chest discomfort or lower extremity swelling Gastrointestinal:  Denies nausea, heartburn or change in bowel habits Skin: Denies abnormal skin rashes Lymphatics: Denies new lymphadenopathy or easy bruising Neurological:Denies numbness, tingling or new weaknesses Behavioral/Psych: Mood is stable, no new changes  All other systems were reviewed with the patient and are negative.   VITALS:  Blood pressure (!) 141/79, pulse 87, temperature 98.6 F (37 C), temperature source Oral, resp. rate 20, height 5\' 3"  (1.6 m), weight 219 lb 9.6 oz (99.6 kg), SpO2 97 %.  Wt Readings from Last 3 Encounters:  05/02/21 219 lb 9.6 oz (99.6 kg)  04/12/21 221 lb 8 oz (100.5 kg)  04/11/21 219 lb (99.3 kg)    Body mass index is 38.9  kg/m.  Performance status (ECOG): 2 - Symptomatic, <50% confined to bed  PHYSICAL EXAM:   GENERAL:alert, no distress and comfortable SKIN: skin color, texture, turgor are normal, no rashes or significant lesions EYES: normal, Conjunctiva are pink and non-injected, sclera clear OROPHARYNX:no exudate, no erythema and lips, buccal mucosa, and tongue normal  NECK: supple, thyroid normal size, non-tender, without nodularity LYMPH:  no palpable lymphadenopathy in the cervical, axillary or inguinal LUNGS: clear to auscultation and percussion with normal breathing effort HEART: regular rate & rhythm and no murmurs and no lower extremity edema ABDOMEN:abdomen soft, non-tender and normal bowel sounds Musculoskeletal:no cyanosis of digits and no clubbing  NEURO: alert & oriented x 3 with fluent speech, no focal motor/sensory deficits  LABORATORY DATA:  I have reviewed the data as listed    Component Value Date/Time   NA 138 04/11/2021 0000   K 4.2  04/11/2021 0000   CL 105 04/11/2021 0000   CO2 26 (A) 04/11/2021 0000   GLUCOSE 111 (H) 08/30/2020 1533   BUN 21 04/11/2021 0000   CREATININE 1.0 04/11/2021 0000   CREATININE 1.02 (H) 08/30/2020 1533   CALCIUM 8.5 (A) 04/11/2021 0000   PROT 6.6 08/30/2020 1533   ALBUMIN 4.1 04/11/2021 0000   AST 29 04/11/2021 0000   AST 13 (L) 08/30/2020 1533   ALT 20 04/11/2021 0000   ALT 12 08/30/2020 1533   ALKPHOS 89 04/11/2021 0000   BILITOT 0.1 (L) 08/30/2020 1533   GFRNONAA 56 (L) 08/30/2020 1533   GFRAA 24 (L) 04/01/2011 0349    No results found for: SPEP, UPEP  Lab Results  Component Value Date   WBC 5.0 05/02/2021   NEUTROABS 3.20 05/02/2021   HGB 11.7 (A) 05/02/2021   HCT 36 05/02/2021   MCV 94 04/11/2021   PLT 223 05/02/2021      Chemistry      Component Value Date/Time   NA 138 04/11/2021 0000   K 4.2 04/11/2021 0000   CL 105 04/11/2021 0000   CO2 26 (A) 04/11/2021 0000   BUN 21 04/11/2021 0000   CREATININE 1.0 04/11/2021  0000   CREATININE 1.02 (H) 08/30/2020 1533   GLU 118 04/11/2021 0000      Component Value Date/Time   CALCIUM 8.5 (A) 04/11/2021 0000   ALKPHOS 89 04/11/2021 0000   AST 29 04/11/2021 0000   AST 13 (L) 08/30/2020 1533   ALT 20 04/11/2021 0000   ALT 12 08/30/2020 1533   BILITOT 0.1 (L) 08/30/2020 1533       RADIOGRAPHIC STUDIES: I have personally reviewed the radiological images as listed and agreed with the findings in the report. No results found.

## 2021-05-02 NOTE — Assessment & Plan Note (Signed)
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 She was placed on zoledronic acid in March2022. She developed severe hypocalcemia with her 1st dose and we have not resumed this yet.

## 2021-05-02 NOTE — Assessment & Plan Note (Signed)
Sodium is normal today at 138. She has discontinued home supplements.

## 2021-05-03 ENCOUNTER — Inpatient Hospital Stay: Payer: Medicare Other

## 2021-05-03 VITALS — BP 161/68 | HR 96 | Temp 98.6°F | Resp 20 | Ht 63.0 in | Wt 219.0 lb

## 2021-05-03 DIAGNOSIS — D649 Anemia, unspecified: Secondary | ICD-10-CM | POA: Diagnosis not present

## 2021-05-03 DIAGNOSIS — C7951 Secondary malignant neoplasm of bone: Secondary | ICD-10-CM | POA: Diagnosis not present

## 2021-05-03 DIAGNOSIS — C3432 Malignant neoplasm of lower lobe, left bronchus or lung: Secondary | ICD-10-CM

## 2021-05-03 DIAGNOSIS — C787 Secondary malignant neoplasm of liver and intrahepatic bile duct: Secondary | ICD-10-CM | POA: Diagnosis not present

## 2021-05-03 DIAGNOSIS — Z7901 Long term (current) use of anticoagulants: Secondary | ICD-10-CM | POA: Diagnosis not present

## 2021-05-03 DIAGNOSIS — E222 Syndrome of inappropriate secretion of antidiuretic hormone: Secondary | ICD-10-CM

## 2021-05-03 DIAGNOSIS — E871 Hypo-osmolality and hyponatremia: Secondary | ICD-10-CM

## 2021-05-03 DIAGNOSIS — C3411 Malignant neoplasm of upper lobe, right bronchus or lung: Secondary | ICD-10-CM | POA: Diagnosis not present

## 2021-05-03 DIAGNOSIS — Z86711 Personal history of pulmonary embolism: Secondary | ICD-10-CM | POA: Diagnosis not present

## 2021-05-03 DIAGNOSIS — Z79899 Other long term (current) drug therapy: Secondary | ICD-10-CM | POA: Diagnosis not present

## 2021-05-03 DIAGNOSIS — Z5112 Encounter for antineoplastic immunotherapy: Secondary | ICD-10-CM | POA: Diagnosis not present

## 2021-05-03 MED ORDER — HEPARIN SOD (PORK) LOCK FLUSH 100 UNIT/ML IV SOLN
500.0000 [IU] | Freq: Once | INTRAVENOUS | Status: AC | PRN
  Administered 2021-05-03: 500 [IU]

## 2021-05-03 MED ORDER — SODIUM CHLORIDE 0.9 % IV SOLN: Freq: Once | INTRAVENOUS | Status: AC

## 2021-05-03 MED ORDER — SODIUM CHLORIDE 0.9% FLUSH
10.0000 mL | INTRAVENOUS | Status: DC | PRN
  Administered 2021-05-03: 10 mL

## 2021-05-03 MED ORDER — SODIUM CHLORIDE 0.9 % IV SOLN
1200.0000 mg | Freq: Once | INTRAVENOUS | Status: AC
  Administered 2021-05-03: 1200 mg via INTRAVENOUS
  Filled 2021-05-03: qty 20

## 2021-05-03 NOTE — Patient Instructions (Signed)
[facility]  Discharge Instructions: Thank you for choosing [facility] to provide your oncology and hematology care.  If you have a lab appointment with the [facility], please go directly to the [facility] and check in at the registration area.   Wear comfortable clothing and clothing appropriate for easy access to any Portacath or PICC line.   We strive to give you quality time with your provider. You may need to reschedule your appointment if you arrive late (15 or more minutes).  Arriving late affects you and other patients whose appointments are after yours.  Also, if you miss three or more appointments without notifying the office, you may be dismissed from the clinic at the providers discretion.      For prescription refill requests, have your pharmacy contact our office and allow 72 hours for refills to be completed.    Today you received the following chemotherapy and/or immunotherapy agents tecentriq   To help prevent nausea and vomiting after your treatment, we encourage you to take your nausea medication as directed.  BELOW ARE SYMPTOMS THAT SHOULD BE REPORTED IMMEDIATELY: *FEVER GREATER THAN 100.4 F (38 C) OR HIGHER *CHILLS OR SWEATING *NAUSEA AND VOMITING THAT IS NOT CONTROLLED WITH YOUR NAUSEA MEDICATION *UNUSUAL SHORTNESS OF BREATH *UNUSUAL BRUISING OR BLEEDING *URINARY PROBLEMS (pain or burning when urinating, or frequent urination) *BOWEL PROBLEMS (unusual diarrhea, constipation, pain near the anus) TENDERNESS IN MOUTH AND THROAT WITH OR WITHOUT PRESENCE OF ULCERS (sore throat, sores in mouth, or a toothache) UNUSUAL RASH, SWELLING OR PAIN  UNUSUAL VAGINAL DISCHARGE OR ITCHING   Items with * indicate a potential emergency and should be followed up as soon as possible or go to the Emergency Department if any problems should occur.  Please show the CHEMOTHERAPY ALERT CARD or IMMUNOTHERAPY ALERT CARD at check-in to the  Emergency Department and triage nurse.  Should you have questions after your visit or need to cancel or reschedule your appointment, please contact [facility]  Dept: [phone]  and follow the prompts.  Office hours are 8:00 a.m. to 4:30 p.m. Monday - Friday. Please note that voicemails left after 4:00 p.m. may not be returned until the following business day.  We are closed weekends and major holidays. You have access to a nurse at all times for urgent questions. Please call the main number to the clinic Dept: [phone] and follow the prompts.  For any non-urgent questions, you may also contact your provider using MyChart. We now offer e-Visits for anyone 44 and older to request care online for non-urgent symptoms. For details visit [email or web address].   Also download the MyChart app! Go to the app store, search "MyChart", open the app, select [facility], and log in with your MyChart username and password.  Due to Covid, a mask is required upon entering the hospital/clinic. If you do not have a mask, one will be given to you upon arrival. For doctor visits, patients may have 1 support person aged 50 or older with them. For treatment visits, patients cannot have anyone with them due to current Covid guidelines and our immunocompromised population.   Atezolizumab injection What is this medication? ATEZOLIZUMAB (a te zoe LIZ ue mab) is a monoclonal antibody. It is used to treat bladder cancer (urothelial cancer), liver cancer, lung cancer, and melanoma. This medicine may be used for other purposes; ask your health care provider or pharmacist if you have questions. COMMON BRAND NAME(S):  Tecentriq What should I tell my care team before I take this medication? They need to know if you have any of these conditions: autoimmune diseases like Crohn's disease, ulcerative colitis, or lupus have had or planning to have an allogeneic stem cell transplant (uses someone else's  stem cells) history of organ transplant history of radiation to the chest nervous system problems like myasthenia gravis or Guillain-Barre syndrome an unusual or allergic reaction to atezolizumab, other medicines, foods, dyes, or preservatives pregnant or trying to get pregnant breast-feeding How should I use this medication? This medicine is for infusion into a vein. It is given by a health care professional in a hospital or clinic setting. A special MedGuide will be given to you before each treatment. Be sure to read this information carefully each time. Talk to your pediatrician regarding the use of this medicine in children. Special care may be needed. Overdosage: If you think you have taken too much of this medicine contact a poison control center or emergency room at once. NOTE: This medicine is only for you. Do not share this medicine with others. What if I miss a dose? It is important not to miss your dose. Call your doctor or health care professional if you are unable to keep an appointment. What may interact with this medication? Interactions have not been studied. This list may not describe all possible interactions. Give your health care provider a list of all the medicines, herbs, non-prescription drugs, or dietary supplements you use. Also tell them if you smoke, drink alcohol, or use illegal drugs. Some items may interact with your medicine. What should I watch for while using this medication? Your condition will be monitored carefully while you are receiving this medicine. You may need blood work done while you are taking this medicine. Do not become pregnant while taking this medicine or for at least 5 months after stopping it. Women should inform their doctor if they wish to become pregnant or think they might be pregnant. There is a potential for serious side effects to an unborn child. Talk to your health care professional or pharmacist for more information. Do not  breast-feed an infant while taking this medicine or for at least 5 months after the last dose. What side effects may I notice from receiving this medication? Side effects that you should report to your doctor or health care professional as soon as possible: allergic reactions like skin rash, itching or hives, swelling of the face, lips, or tongue black, tarry stools bloody or watery diarrhea breathing problems changes in vision chest pain or chest tightness chills facial flushing fever headache signs and symptoms of high blood sugar such as dizziness; dry mouth; dry skin; fruity breath; nausea; stomach pain; increased hunger or thirst; increased urination signs and symptoms of liver injury like dark yellow or brown urine; general ill feeling or flu-like symptoms; light-colored stools; loss of appetite; nausea; right upper belly pain; unusually weak or tired; yellowing of the eyes or skin stomach pain trouble passing urine or change in the amount of urine Side effects that usually do not require medical attention (report to your doctor or health care professional if they continue or are bothersome): bone pain cough diarrhea joint pain muscle pain muscle weakness swelling of arms or legs tiredness weight loss This list may not describe all possible side effects. Call your doctor for medical advice about side effects. You may report side effects to FDA at 1-800-FDA-1088. Where should I keep my medication? This  drug is given in a hospital or clinic and will not be stored at home. NOTE: This sheet is a summary. It may not cover all possible information. If you have questions about this medicine, talk to your doctor, pharmacist, or health care provider.  2022 Elsevier/Gold Standard (2020-11-28 00:00:00)

## 2021-05-04 LAB — T4: T4, Total: 9.7 ug/dL (ref 4.5–12.0)

## 2021-05-15 NOTE — Progress Notes (Signed)
Sent in transportation Request for DOS 05/23/2021 and 05/24/2021 to [employer].

## 2021-05-23 ENCOUNTER — Other Ambulatory Visit: Payer: Self-pay

## 2021-05-23 ENCOUNTER — Encounter: Payer: Self-pay | Admitting: Hematology and Oncology

## 2021-05-23 ENCOUNTER — Inpatient Hospital Stay: Payer: Medicare Other

## 2021-05-23 ENCOUNTER — Inpatient Hospital Stay: Payer: Medicare Other | Attending: Hematology and Oncology | Admitting: Hematology and Oncology

## 2021-05-23 DIAGNOSIS — C7952 Secondary malignant neoplasm of bone marrow: Secondary | ICD-10-CM | POA: Diagnosis not present

## 2021-05-23 DIAGNOSIS — Z86711 Personal history of pulmonary embolism: Secondary | ICD-10-CM | POA: Insufficient documentation

## 2021-05-23 DIAGNOSIS — E039 Hypothyroidism, unspecified: Secondary | ICD-10-CM | POA: Diagnosis not present

## 2021-05-23 DIAGNOSIS — D649 Anemia, unspecified: Secondary | ICD-10-CM | POA: Diagnosis not present

## 2021-05-23 DIAGNOSIS — E032 Hypothyroidism due to medicaments and other exogenous substances: Secondary | ICD-10-CM | POA: Diagnosis not present

## 2021-05-23 DIAGNOSIS — C7951 Secondary malignant neoplasm of bone: Secondary | ICD-10-CM | POA: Diagnosis not present

## 2021-05-23 DIAGNOSIS — Z0000 Encounter for general adult medical examination without abnormal findings: Secondary | ICD-10-CM | POA: Diagnosis not present

## 2021-05-23 DIAGNOSIS — Z923 Personal history of irradiation: Secondary | ICD-10-CM | POA: Diagnosis not present

## 2021-05-23 DIAGNOSIS — C3432 Malignant neoplasm of lower lobe, left bronchus or lung: Secondary | ICD-10-CM

## 2021-05-23 DIAGNOSIS — Z79890 Hormone replacement therapy (postmenopausal): Secondary | ICD-10-CM | POA: Insufficient documentation

## 2021-05-23 DIAGNOSIS — C787 Secondary malignant neoplasm of liver and intrahepatic bile duct: Secondary | ICD-10-CM | POA: Diagnosis not present

## 2021-05-23 DIAGNOSIS — E871 Hypo-osmolality and hyponatremia: Secondary | ICD-10-CM

## 2021-05-23 DIAGNOSIS — C3411 Malignant neoplasm of upper lobe, right bronchus or lung: Secondary | ICD-10-CM | POA: Diagnosis not present

## 2021-05-23 DIAGNOSIS — Z79899 Other long term (current) drug therapy: Secondary | ICD-10-CM | POA: Diagnosis not present

## 2021-05-23 DIAGNOSIS — Z7901 Long term (current) use of anticoagulants: Secondary | ICD-10-CM | POA: Insufficient documentation

## 2021-05-23 DIAGNOSIS — E222 Syndrome of inappropriate secretion of antidiuretic hormone: Secondary | ICD-10-CM

## 2021-05-23 DIAGNOSIS — E785 Hyperlipidemia, unspecified: Secondary | ICD-10-CM | POA: Diagnosis not present

## 2021-05-23 DIAGNOSIS — Z5112 Encounter for antineoplastic immunotherapy: Secondary | ICD-10-CM | POA: Diagnosis not present

## 2021-05-23 DIAGNOSIS — Z139 Encounter for screening, unspecified: Secondary | ICD-10-CM | POA: Diagnosis not present

## 2021-05-23 DIAGNOSIS — Z9181 History of falling: Secondary | ICD-10-CM | POA: Diagnosis not present

## 2021-05-23 LAB — BASIC METABOLIC PANEL WITH GFR
BUN: 19 (ref 4–21)
CO2: 29 — AB (ref 13–22)
Chloride: 103 (ref 99–108)
Creatinine: 0.8 (ref 0.5–1.1)
Glucose: 101
Potassium: 4 (ref 3.4–5.3)
Sodium: 138 (ref 137–147)

## 2021-05-23 LAB — CBC AND DIFFERENTIAL
HCT: 34 — AB (ref 36–46)
Hemoglobin: 11.2 — AB (ref 12.0–16.0)
Neutrophils Absolute: 4.1
Platelets: 226 (ref 150–399)
WBC: 6.3

## 2021-05-23 LAB — COMPREHENSIVE METABOLIC PANEL
Albumin: 3.8 (ref 3.5–5.0)
Calcium: 9.1 (ref 8.7–10.7)

## 2021-05-23 LAB — HEPATIC FUNCTION PANEL
ALT: 14 (ref 7–35)
AST: 18 (ref 13–35)
Alkaline Phosphatase: 79 (ref 25–125)
Bilirubin, Total: 0.3

## 2021-05-23 LAB — TSH: TSH: 1.67 u[IU]/mL (ref 0.350–4.500)

## 2021-05-23 LAB — CBC: RBC: 3.78 — AB (ref 3.87–5.11)

## 2021-05-23 MED FILL — Atezolizumab IV Soln 1200 MG/20ML: INTRAVENOUS | Qty: 20 | Status: AC

## 2021-05-23 NOTE — Assessment & Plan Note (Signed)
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 ??She was initially treated with chemotherapy with carboplatin/etoposide with a good response. ?She was then switched to nivolumab.?However, she?was found to have progression on disease in August 2022. ?She underwent palliative radiation therapy, which she completed in August. ?She was then placed on receiving palliative?chemotherapy consisting of carboplatin/etoposide/atezolizumab?and completed 4 cycles.?CT imaging from December revealed a positive response to therapy. We is now receiving maintenance atezolizumab.  She will proceed with 8th cycle this week.  We will plan to see her back in 3 weeks with a CBC, comprehensive metabolic panel and CEA prior to her 9th cycle. ?

## 2021-05-23 NOTE — Assessment & Plan Note (Signed)
Her TSH has more recently been in normal range on levothyroxine 100 mcg daily.  TSH is pending from today. ?

## 2021-05-23 NOTE — Progress Notes (Signed)
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   She was initially treated with chemotherapy with carboplatin/etoposide with a good response.  She was then switched to nivolumab. However, she was found to have progression on disease in August 2022.  She underwent palliative radiation therapy, which she completed in August.  She was then placed on receiving palliative chemotherapy consisting of carboplatin/etoposide/atezolizumab and completed 4 cycles. CT imaging from December revealed a positive response to therapy. We is now receiving maintenance atezolizumab.  She will proceed with 8th cycle this week.  We will plan to see her back in 3 weeks with a CBC, comprehensive metabolic panel and CEA prior to her 9th cycle.  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  She was placed on zoledronic acid in March 2022.  She developed severe hypocalcemia with her 1st dose and we have not resumed this yet.  She denies significant pain.    Hypothyroidism Her TSH has more recently been in normal range on levothyroxine 100 mcg daily.  TSH is pending from today.   The patient understands the plans discussed today and is in agreement with them.  She knows to contact our office if she develops concerns prior to her next appointment.     [name], PA-C  [facility] [address] [place] [address] Dept: [phone] Dept Fax: [phone]   Orders Placed This Encounter   Procedures   Basic metabolic panel    This external order was created through the Results Console.   Comprehensive metabolic panel    This external order was created through the Results Console.   Hepatic function panel    This external order was created through the Results Console.      CHIEF COMPLAINT:  CC: Recurrent small cell lung cancer  Current Treatment: Maintenance atezolizumab  HISTORY OF PRESENT ILLNESS:   Oncology History  Small cell carcinoma of lower lobe of left lung ([facility])  01/08/2020 Imaging   CTA CHEST WITH CONTRAST: No evidence of pulmonary embolus.   Tumor encases and narrows the left lower lobe pulmonary artery in the inferior left hilum measuring up to 5.2 cm. This is increased from 1.9 cm previously.   Irregular calcified and noncalcified plaque in the descending thoracic aorta. Coronary artery disease.   Left lower lobe atelectasis.   Aortic Atherosclerosis (ICD10-I70.0) and Emphysema (ICD10-J43.9).    01/11/2020 Pathology Results   LEFT LOWER LOBE LUNG MASS, BIOPSY:               SMALL CELL CARCINOMA WITH CRUSH ARTIFACT   01/20/2020 Initial Diagnosis   Lung cancer, lower lobe ([facility])   01/21/2020 Cancer Staging   Staging form: Lung, AJCC 8th Edition - Pathologic stage from 01/21/2020: Stage IIIB (pT3, pN2, cM0) - Signed by Derwood Kaplan, MD on 01/21/2020    01/31/2020 - 04/28/2020 Chemotherapy   Carboplatin/etoposide        06/08/2020 - 10/13/2020 Chemotherapy   Nivolumab  08/28/2020 Imaging   CT CHEST, ABDOMEN AND PELVIS WITHOUT CONTRAST: 1. Progressive soft tissue thickening between the aorta and the left mainstem bronchus suspicious for recurrent tumor. PET-CT may be helpful for further evaluation if clinically indicated. 2. Stable radiation changes involving the right hilum and right lower lobe. 3. Stable severe emphysematous changes and pulmonary scarring. 4. Stable right hepatic lobe lesion. 5. Stable scattered  sclerotic bone lesions. 6. Stable advanced atherosclerotic calcifications involving the thoracic and abdominal aorta and branch vessels including the coronary arteries. 7. Chronic bilateral hip AVN. 8. Emphysema and aortic atherosclerosis.   10/15/2020 Imaging   CTA CHEST WITH CONTRAST: 1. No evidence of a pulmonary embolism. 2. No acute findings. 3. Soft tissue mass adjacent to the left peripheral mainstem bronchus has increased in size from the prior CT concerning for recurrence or progression of carcinoma. 4. No other change. 5. Advanced emphysema, coronary artery calcifications and aortic atherosclerosis.   12/22/2020 Imaging   CT CHEST, ABDOMEN AND PELVIS WITH CONTRAST: 1. Interval mixed response to therapy. 2. There has been interval resolution of left hilar adenopathy. Masslike architectural distortion within the superior segment of right lower lobe is slightly decreased in size in the interval. 3. Interval enlargement of subcarinal lymph node concerning for metastatic adenopathy. 4. There are several new, low-attenuation lesions within both lobes of liver. Worrisome for metastatic disease. 5. Stable appearance of multifocal sclerotic bone metastases. 6. Aortic Atherosclerosis (ICD10-I70.0) and Emphysema (ICD10-J43.9).   12/25/2020 -  Chemotherapy   Patient is on Treatment Plan : LUNG SCLC Carboplatin + Etoposide + Atezolizumab Induction q21d / Atezolizumab Maintenance q21d     03/20/2021 Imaging   CT CHEST, ABDOMEN AND PELVIS WITH CONTRAST: Stable post treatment changes in the right hilum and superior RIGHT  mediastinal border.  Decreased size of subcarinal lymph node.  Stable small bilateral pulmonary nodules.  Decreased size of the dominant hepatic lesion, smaller hepatic  lesion seen on the prior study is no longer visible on today's exam.  No change in the appearance of multifocal areas of bony metastatic  disease with sclerosis.  Bilateral femoral AVN of the  femoral heads.  Emphysema and aortic atherosclerosis.  Aortic Atherosclerosis (ICD10-I70.0) and Emphysema (ICD10-J43.9).    Secondary malignant neoplasm of bone and bone marrow ([facility])  05/29/2020 Imaging   CT CHEST, ABDOMEN AND PELVIS WITH CONTRAST: 1. New osseous metastatic disease. 2. Post radiation changes in the right hemithorax, stable. 3. Aortic atherosclerosis (ICD10-I70.0). Coronary artery calcification. 4. Emphysema (ICD10-J43.9).       INTERVAL HISTORY:  Gabrielle Hudson is here today for repeat clinical assessment prior to 8th cycle of atezolizumab.  She states she continues to tolerate this well.  She reports stable dyspnea with exertion.  She uses a motorized wheelchair to get around.  She denies cough or chest pain.  She denies fevers or chills. She denies pain. Her appetite is good. Her weight has increased 4 pounds over last 3 weeks .  REVIEW OF SYSTEMS:  Review of Systems  Constitutional:  Negative for appetite change, chills, fatigue, fever and unexpected weight change.  HENT:   Negative for lump/mass, mouth sores and sore throat.   Respiratory:  Positive for shortness of breath (With exertion). Negative for cough.   Cardiovascular:  Negative for chest pain and leg swelling.  Gastrointestinal:  Negative for abdominal pain, constipation, diarrhea, nausea and vomiting.  Endocrine: Negative for hot flashes.  Genitourinary:  Negative for difficulty urinating, dysuria, frequency and hematuria.   Musculoskeletal:  Negative for  arthralgias, back pain and myalgias.  Skin:  Negative for rash.  Neurological:  Negative for dizziness and headaches.  Hematological:  Negative for adenopathy. Does not bruise/bleed easily.  Psychiatric/Behavioral:  Negative for depression and sleep disturbance. The patient is not nervous/anxious.     VITALS:  Blood pressure (!) 149/70, pulse 89, temperature 97.9 F (36.6 C), temperature source Oral, resp. rate 20, height 5\' 3"  (1.6 m), weight 223 lb (101.2  kg), SpO2 98 %.  Wt Readings from Last 3 Encounters:  05/23/21 223 lb (101.2 kg)  05/03/21 219 lb (99.3 kg)  05/02/21 219 lb 9.6 oz (99.6 kg)    Body mass index is 39.5 kg/m.  Performance status (ECOG): 2 - Symptomatic, <50% confined to bed  PHYSICAL EXAM:  Physical Exam Vitals and nursing note reviewed.  Constitutional:      General: She is not in acute distress.    Appearance: Normal appearance.  HENT:     Head: Normocephalic and atraumatic.     Mouth/Throat:     Mouth: Mucous membranes are moist.     Pharynx: Oropharynx is clear. No oropharyngeal exudate or posterior oropharyngeal erythema.  Eyes:     General: No scleral icterus.    Extraocular Movements: Extraocular movements intact.     Conjunctiva/sclera: Conjunctivae normal.     Pupils: Pupils are equal, round, and reactive to light.  Cardiovascular:     Rate and Rhythm: Normal rate and regular rhythm.     Heart sounds: Normal heart sounds. No murmur heard.   No friction rub. No gallop.  Pulmonary:     Effort: Pulmonary effort is normal.     Breath sounds: Decreased breath sounds (Decreased breath sounds throughout) present. No wheezing, rhonchi or rales.  Abdominal:     General: There is no distension.     Palpations: Abdomen is soft. There is no hepatomegaly, splenomegaly or mass.     Tenderness: There is no abdominal tenderness.  Musculoskeletal:        General: Normal range of motion.     Cervical back: Normal range of motion and neck supple. No tenderness.     Right lower leg: No edema.     Left lower leg: No edema.  Lymphadenopathy:     Cervical: No cervical adenopathy.     Upper Body:     Right upper body: No supraclavicular or axillary adenopathy.     Left upper body: No supraclavicular or axillary adenopathy.     Lower Body: No right inguinal adenopathy. No left inguinal adenopathy.  Skin:    General: Skin is warm and dry.     Coloration: Skin is not jaundiced.     Findings: No rash.  Neurological:      Mental Status: She is alert and oriented to person, place, and time.     Cranial Nerves: No cranial nerve deficit.  Psychiatric:        Mood and Affect: Mood normal.        Behavior: Behavior normal.        Thought Content: Thought content normal.    LABS:   CBC Latest Ref Rng & Units 05/23/2021 05/02/2021 04/11/2021  WBC - 6.3 5.0 6.1  Hemoglobin 12.0 - 16.0 11.2(A) 11.7(A) 11.1(A)  Hematocrit 36 - 46 34(A) 36 34(A)  Platelets 150 - 399 226 223 222   CMP Latest Ref Rng & Units 05/23/2021 05/02/2021 04/11/2021  Glucose 70 - 99 mg/dL - - -  BUN 4 - 21 19 15  21  Creatinine 0.5 - 1.1 0.8 0.9 1.0  Sodium 137 - 147 138 140 138  Potassium 3.4 - 5.3 4.0 4.1 4.2  Chloride 99 - 108 103 104 105  CO2 13 - 22 29(A) 26(A) 26(A)  Calcium 8.7 - 10.7 9.1 8.8 8.5(A)  Total Protein 6.5 - 8.1 g/dL - - -  Total Bilirubin 0.3 - 1.2 mg/dL - - -  Alkaline Phos 25 - 125 79 95 89  AST 13 - 35 18 21 29   ALT 7 - 35 14 14 20      Lab Results  Component Value Date   CEA1 7.7 (H) 04/11/2021   /  CEA  Date Value Ref Range Status  04/11/2021 7.7 (H) 0.0 - 4.7 ng/mL Final    Comment:    (NOTE)                             Nonsmokers          <3.9                             Smokers             <5.6 Roche [employer] Electrochemiluminescence Immunoassay (ECLIA) Values obtained with different assay methods or kits cannot be used interchangeably.  Results cannot be interpreted as absolute evidence of the presence or absence of malignant disease. Performed At: [facility] [place], [place] [id number] Rush Farmer MD BM:[id number]    No results found for: PSA1 No results found for: CAN199 No results found for: UVO536  Lab Results  Component Value Date   TOTALPROTELP 4.7 (L) 03/26/2011   ALBUMINELP 52.5 (L) 03/26/2011   A1GS 8.7 (H) 03/26/2011   A2GS 18.8 (H) 03/26/2011   BETS 4.5 (L) 03/26/2011   BETA2SER 4.7 03/26/2011   GAMS 10.8 (L) 03/26/2011   MSPIKE NOT DETECTED  03/26/2011   SPEI (NOTE) 03/26/2011   Lab Results  Component Value Date   TIBC 226 04/19/2020   TIBC 219 (L) 03/29/2011   FERRITIN 361 04/19/2020   FERRITIN 356 (H) 03/29/2011   IRONPCTSAT 44.2 04/19/2020   IRONPCTSAT 65 (H) 03/29/2011   Lab Results  Component Value Date   LDH 349 (H) 03/26/2011    STUDIES:  No results found.    HISTORY:   Past Medical History:  Diagnosis Date   Anemia, unspecified    Anemia, unspecified    Cancer ([facility]) 01/2009   SCC of the lung   COPD (chronic obstructive pulmonary disease) (HCC) 2-3 LPM   GERD (gastroesophageal reflux disease)    Hyposmolality syndrome    Hyposmolality syndrome    Hypothyroidism    Malignant neoplasm of overlapping sites of right lung (HCC)    Malignant neoplasm of overlapping sites of right lung (HCC)    Malignant neoplasm of sigmoid colon (HCC)    Malignant neoplasm of sigmoid colon (HCC)    Shortness of breath    Sleep apnea     Past Surgical History:  Procedure Laterality Date   COLON SURGERY     PORTACATH PLACEMENT  2010    Family History  Problem Relation Age of Onset   Colon cancer Father    Prostate cancer Father     Social History:  reports that she quit smoking about 13 years ago. Her smoking use included cigarettes. She has never used smokeless tobacco. She reports that she does not  drink alcohol and does not use drugs.The patient is alone today.  Allergies:  Allergies  Allergen Reactions   Nitroglycerin In D5w Other (See Comments)    "flatlines"   Nitroglycerin Other (See Comments)    Drops her BP 'flatlines'     Current Medications: Current Outpatient Medications  Medication Sig Dispense Refill   albuterol (PROVENTIL) (2.5 MG/3ML) 0.083% nebulizer solution SMARTSIG:1 Vial(s) Via Nebulizer 4 Times Daily PRN     albuterol (VENTOLIN HFA) 108 (90 Base) MCG/ACT inhaler Inhale into the lungs every 6 (six) hours as needed for wheezing or shortness of breath.     apixaban (ELIQUIS) 5 MG  TABS tablet Take 1 tablet (5 mg total) by mouth 2 (two) times daily. 60 tablet 5   budesonide-formoterol (SYMBICORT) 160-4.5 MCG/ACT inhaler Inhale 2 puffs into the lungs 2 (two) times daily.       calcium citrate-vitamin D (CITRACAL+D) 315-200 MG-UNIT tablet Take 1 tablet by mouth in the morning, at noon, and at bedtime.     Cholecalciferol (VITAMIN D3) 1.25 MG (50000 UT) CAPS Take 1.25 mg by mouth once a week. 12 capsule 3   cyclobenzaprine (FLEXERIL) 10 MG tablet Take 1 tablet (10 mg total) by mouth 3 (three) times daily as needed for muscle spasms. 30 tablet 3   diltiazem (CARDIZEM CD) 120 MG 24 hr capsule Take 120 mg by mouth daily.     famotidine (PEPCID) 20 MG tablet Take 20 mg by mouth 2 (two) times daily.     furosemide (LASIX) 20 MG tablet Take 20 mg by mouth as needed. ONLY TAKES IT HAVING SWELLING, can take 2 tablets it more than a 3 lb weight gain ever night     gabapentin (NEURONTIN) 300 MG capsule Take 1 capsule (300 mg total) by mouth 2 (two) times daily. 60 capsule 4   HYDROcodone-acetaminophen (NORCO/VICODIN) 5-325 MG tablet Take 1 tablet by mouth every 4 (four) hours as needed for moderate pain. 30 tablet 0   levothyroxine (SYNTHROID) 100 MCG tablet Take 1 tablet (100 mcg total) by mouth daily before breakfast. 30 tablet 5   loratadine (CLARITIN) 10 MG tablet Take 10 mg by mouth daily.     meclizine (ANTIVERT) 25 MG tablet Take 0.5-1 tablets (12.5-25 mg total) by mouth 3 (three) times daily as needed for dizziness. 120 tablet 3   OXYGEN Inhale 3 L into the lungs continuous.     Potassium Chloride ER 20 MEQ TBCR Take 1 tablet by mouth 2 (two) times daily. 60 tablet 5   rOPINIRole (REQUIP) 0.5 MG tablet Take 0.5 mg by mouth at bedtime.     No current facility-administered medications for this visit.

## 2021-05-23 NOTE — Assessment & Plan Note (Signed)
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  She was placed on zoledronic acid in [place]. ?She developed severe hypocalcemia with her 1st dose and we have not resumed this yet.  She denies significant pain.   ?

## 2021-05-24 ENCOUNTER — Encounter: Payer: Self-pay | Admitting: Oncology

## 2021-05-24 ENCOUNTER — Inpatient Hospital Stay: Payer: Medicare Other

## 2021-05-24 VITALS — BP 153/67 | HR 82 | Temp 97.9°F | Resp 18 | Ht 63.0 in | Wt 224.2 lb

## 2021-05-24 DIAGNOSIS — C3432 Malignant neoplasm of lower lobe, left bronchus or lung: Secondary | ICD-10-CM

## 2021-05-24 DIAGNOSIS — E871 Hypo-osmolality and hyponatremia: Secondary | ICD-10-CM

## 2021-05-24 DIAGNOSIS — Z79899 Other long term (current) drug therapy: Secondary | ICD-10-CM | POA: Diagnosis not present

## 2021-05-24 DIAGNOSIS — D649 Anemia, unspecified: Secondary | ICD-10-CM | POA: Diagnosis not present

## 2021-05-24 DIAGNOSIS — Z86711 Personal history of pulmonary embolism: Secondary | ICD-10-CM | POA: Diagnosis not present

## 2021-05-24 DIAGNOSIS — Z923 Personal history of irradiation: Secondary | ICD-10-CM | POA: Diagnosis not present

## 2021-05-24 DIAGNOSIS — C7951 Secondary malignant neoplasm of bone: Secondary | ICD-10-CM | POA: Diagnosis not present

## 2021-05-24 DIAGNOSIS — E222 Syndrome of inappropriate secretion of antidiuretic hormone: Secondary | ICD-10-CM

## 2021-05-24 DIAGNOSIS — E039 Hypothyroidism, unspecified: Secondary | ICD-10-CM | POA: Diagnosis not present

## 2021-05-24 DIAGNOSIS — C3411 Malignant neoplasm of upper lobe, right bronchus or lung: Secondary | ICD-10-CM | POA: Diagnosis not present

## 2021-05-24 DIAGNOSIS — Z5112 Encounter for antineoplastic immunotherapy: Secondary | ICD-10-CM | POA: Diagnosis not present

## 2021-05-24 DIAGNOSIS — Z7901 Long term (current) use of anticoagulants: Secondary | ICD-10-CM | POA: Diagnosis not present

## 2021-05-24 DIAGNOSIS — C787 Secondary malignant neoplasm of liver and intrahepatic bile duct: Secondary | ICD-10-CM | POA: Diagnosis not present

## 2021-05-24 LAB — T4: T4, Total: 8.9 ug/dL (ref 4.5–12.0)

## 2021-05-24 MED ORDER — SODIUM CHLORIDE 0.9% FLUSH
10.0000 mL | INTRAVENOUS | Status: DC | PRN
  Administered 2021-05-24: 10 mL

## 2021-05-24 MED ORDER — SODIUM CHLORIDE 0.9 % IV SOLN
1200.0000 mg | Freq: Once | INTRAVENOUS | Status: AC
  Administered 2021-05-24: 1200 mg via INTRAVENOUS
  Filled 2021-05-24: qty 20

## 2021-05-24 MED ORDER — HEPARIN SOD (PORK) LOCK FLUSH 100 UNIT/ML IV SOLN
500.0000 [IU] | Freq: Once | INTRAVENOUS | Status: AC | PRN
  Administered 2021-05-24: 500 [IU]

## 2021-05-24 MED ORDER — SODIUM CHLORIDE 0.9 % IV SOLN: Freq: Once | INTRAVENOUS | Status: AC

## 2021-05-24 NOTE — Patient Instructions (Signed)
[facility]  Discharge Instructions: ?Thank you for choosing [facility] to provide your oncology and hematology care.  ?If you have a lab appointment with the [facility], please go directly to the [facility] and check in at the registration area. ?  ?Wear comfortable clothing and clothing appropriate for easy access to any Portacath or PICC line.  ? ?We strive to give you quality time with your provider. You may need to reschedule your appointment if you arrive late (15 or more minutes).  Arriving late affects you and other patients whose appointments are after yours.  Also, if you miss three or more appointments without notifying the office, you may be dismissed from the clinic at the provider?s discretion.    ?  ?For prescription refill requests, have your pharmacy contact our office and allow 72 hours for refills to be completed.   ? ?Today you received the following chemotherapy and/or immunotherapy agents atezolizumab    ?  ?To help prevent nausea and vomiting after your treatment, we encourage you to take your nausea medication as directed. ? ?BELOW ARE SYMPTOMS THAT SHOULD BE REPORTED IMMEDIATELY: ?*FEVER GREATER THAN 100.4 F (38 ?C) OR HIGHER ?*CHILLS OR SWEATING ?*NAUSEA AND VOMITING THAT IS NOT CONTROLLED WITH YOUR NAUSEA MEDICATION ?*UNUSUAL SHORTNESS OF BREATH ?*UNUSUAL BRUISING OR BLEEDING ?*URINARY PROBLEMS (pain or burning when urinating, or frequent urination) ?*BOWEL PROBLEMS (unusual diarrhea, constipation, pain near the anus) ?TENDERNESS IN MOUTH AND THROAT WITH OR WITHOUT PRESENCE OF ULCERS (sore throat, sores in mouth, or a toothache) ?UNUSUAL RASH, SWELLING OR PAIN  ?UNUSUAL VAGINAL DISCHARGE OR ITCHING  ? ?Items with * indicate a potential emergency and should be followed up as soon as possible or go to the Emergency Department if any problems should occur. ? ?Please show the CHEMOTHERAPY ALERT CARD or IMMUNOTHERAPY ALERT CARD at check-in to the  Emergency Department and triage nurse. ? ?Should you have questions after your visit or need to cancel or reschedule your appointment, please contact [facility]  Dept: [phone]  and follow the prompts.  Office hours are 8:00 a.m. to 4:30 p.m. Monday - Friday. Please note that voicemails left after 4:00 p.m. may not be returned until the following business day.  We are closed weekends and major holidays. You have access to a nurse at all times for urgent questions. Please call the main number to the clinic Dept: [phone] and follow the prompts. ? ?For any non-urgent questions, you may also contact your provider using MyChart. We now offer e-Visits for anyone 56 and older to request care online for non-urgent symptoms. For details visit [email or web address]. ?  ?Also download the MyChart app! Go to the app store, search "MyChart", open the app, select [facility] , and log in with your MyChart username and password. ? ?Due to Covid, a mask is required upon entering the hospital/clinic. If you do not have a mask, one will be given to you upon arrival. For doctor visits, patients may have 1 support person aged 59 or older with them. For treatment visits, patients cannot have anyone with them due to current Covid guidelines and our immunocompromised population.  ? ? ?

## 2021-05-29 DIAGNOSIS — J449 Chronic obstructive pulmonary disease, unspecified: Secondary | ICD-10-CM | POA: Diagnosis not present

## 2021-06-04 NOTE — Progress Notes (Signed)
Sent in request for DOS 06/13/2021 and 06/14/2021 to [employer].  ?

## 2021-06-13 ENCOUNTER — Other Ambulatory Visit: Payer: Self-pay

## 2021-06-13 ENCOUNTER — Other Ambulatory Visit: Payer: Self-pay | Admitting: Oncology

## 2021-06-13 ENCOUNTER — Inpatient Hospital Stay (INDEPENDENT_AMBULATORY_CARE_PROVIDER_SITE_OTHER): Payer: Medicare Other | Admitting: Oncology

## 2021-06-13 ENCOUNTER — Inpatient Hospital Stay: Payer: Medicare Other

## 2021-06-13 VITALS — BP 163/74 | HR 77 | Temp 98.7°F | Resp 16 | Ht 63.0 in | Wt 228.1 lb

## 2021-06-13 DIAGNOSIS — E222 Syndrome of inappropriate secretion of antidiuretic hormone: Secondary | ICD-10-CM | POA: Diagnosis not present

## 2021-06-13 DIAGNOSIS — C3432 Malignant neoplasm of lower lobe, left bronchus or lung: Secondary | ICD-10-CM

## 2021-06-13 DIAGNOSIS — D649 Anemia, unspecified: Secondary | ICD-10-CM | POA: Diagnosis not present

## 2021-06-13 DIAGNOSIS — Z86711 Personal history of pulmonary embolism: Secondary | ICD-10-CM | POA: Diagnosis not present

## 2021-06-13 DIAGNOSIS — Z79899 Other long term (current) drug therapy: Secondary | ICD-10-CM | POA: Diagnosis not present

## 2021-06-13 DIAGNOSIS — Z923 Personal history of irradiation: Secondary | ICD-10-CM | POA: Diagnosis not present

## 2021-06-13 DIAGNOSIS — Z5112 Encounter for antineoplastic immunotherapy: Secondary | ICD-10-CM | POA: Diagnosis not present

## 2021-06-13 DIAGNOSIS — C7952 Secondary malignant neoplasm of bone marrow: Secondary | ICD-10-CM | POA: Diagnosis not present

## 2021-06-13 DIAGNOSIS — C3411 Malignant neoplasm of upper lobe, right bronchus or lung: Secondary | ICD-10-CM | POA: Diagnosis not present

## 2021-06-13 DIAGNOSIS — C7951 Secondary malignant neoplasm of bone: Secondary | ICD-10-CM | POA: Diagnosis not present

## 2021-06-13 DIAGNOSIS — C787 Secondary malignant neoplasm of liver and intrahepatic bile duct: Secondary | ICD-10-CM | POA: Diagnosis not present

## 2021-06-13 DIAGNOSIS — E871 Hypo-osmolality and hyponatremia: Secondary | ICD-10-CM

## 2021-06-13 DIAGNOSIS — Z7901 Long term (current) use of anticoagulants: Secondary | ICD-10-CM | POA: Diagnosis not present

## 2021-06-13 DIAGNOSIS — E039 Hypothyroidism, unspecified: Secondary | ICD-10-CM | POA: Diagnosis not present

## 2021-06-13 LAB — HEPATIC FUNCTION PANEL
ALT: 21 U/L (ref 7–35)
AST: 25 (ref 13–35)
Alkaline Phosphatase: 109 (ref 25–125)
Bilirubin, Total: 0.4

## 2021-06-13 LAB — CBC AND DIFFERENTIAL
HCT: 35 — AB (ref 36–46)
Hemoglobin: 11.4 — AB (ref 12.0–16.0)
Neutrophils Absolute: 4.62
Platelets: 218 10*3/uL (ref 150–400)
WBC: 6.5

## 2021-06-13 LAB — BASIC METABOLIC PANEL
BUN: 13 (ref 4–21)
CO2: 29 — AB (ref 13–22)
Chloride: 92 — AB (ref 99–108)
Creatinine: 0.7 (ref 0.5–1.1)
Glucose: 103
Potassium: 3.9 mEq/L (ref 3.5–5.1)
Sodium: 128 — AB (ref 137–147)

## 2021-06-13 LAB — TSH: TSH: 4.631 u[IU]/mL — ABNORMAL HIGH (ref 0.350–4.500)

## 2021-06-13 LAB — CBC: RBC: 4.02 (ref 3.87–5.11)

## 2021-06-13 LAB — COMPREHENSIVE METABOLIC PANEL
Albumin: 4.1 (ref 3.5–5.0)
Calcium: 8.3 — AB (ref 8.7–10.7)

## 2021-06-13 NOTE — Progress Notes (Signed)
?[facility]  ?[address] ?[place],  [place]  [address] ?[phone] ? ?Clinic Day:  06/13/21 ? ?Referring physician: Helen Hashimoto., MD ? ?ASSESSMENT & PLAN:  ? ?Small cell carcinoma of lower lobe of left lung ([facility]) ?Small cell carcinoma of the lung, at least a clinical stage IIIB, diagnosed in October 2021.   She was initially treated with chemotherapy with carboplatin/etoposide with a good response.  She was then switched to nivolumab. However, she was found to have recent progression on disease in August 2022.  She underwent palliative radiation therapy, which she completed in August. She then received palliative chemotherapy consisting of carboplatin/etoposide/atezolizumab and completed 4 cycles. CT imaging from December has shown a positive response. She is now on maintenance atezolizumab. ?  ?Secondary malignant neoplasm of bone and bone marrow ([place]) ?Bone metastases at L2 and L4 vertebral bodies, left sacrum and left ischium were found in March of 2022, as well as mild changes of avascular necrosis in both femoral heads. She was placed on zoledronic acid in March 2022.  She developed severe hypocalcemia with her 1st dose and so we have been administering this every 3 months with the last dose in December 2022.  She is due again this month but her calcium is once again too low at 8.3.. ?  ?Secondary malignant neoplasm of the liver ?She resumed palliative chemotherapy/immunotherapy consisting of carboplatin/etoposide/atezolizumab in October of 2022. She recently completed 4 cycles and will proceed with maintenance immunotherapy. ?  ?Non small cell lung cancer, right, 2010 ?Treated with chemoradiation ?  ?SIADH (syndrome of inappropriate ADH production) ([place]) ?Her sodium is back down to 128 and so we will have her increase the salt tablets back to twice daily. ? ?Hypothyroidism ?Post thyroidectomy hypothyroidism, currently on levothyroxine 100 mcg daily. She states she  is taking this dose before breakfast without food or other medications.  ?  ?Hypocalcemia ?She admits that she is only taking oral calcium supplement once daily and so I recommended she increase this to twice daily.  We want her to take more but she will not be compliant.  We will hold her IV zoledronic acid.  We had scheduled it for every 3 months, but I doubt she will tolerate it at all. ?  ?Anemia ?She was transfused with 1 unit of PRBCs at the end of December.  Her hemoglobin has been doing much better since she is on maintenance atezolizumab. ?  ?Bilateral lower lobe segmental pulmonary emboli almost occlusive in the left lower lobe, October 2021 ?She remains on Eliquis 5 mg BID.  ?  ?We will proceed with atezolizumab as usual and I will ask her to increase her calcium supplement to at least 2 daily.  I will put her zoledronic acid on hold.  She will resume her sodium tablets.  I will add a CEA for next time.  The last one was 6.1 at the end of December. She will return in 3 weeks with CBC and CMP for her next dose.  I will then plan to see her back in 6 weeks with CBC, comprehensive metabolic profile, CEA, and CT scans of chest abdomen and pelvis for restaging in early May. ? ?The patient understands the plans discussed today and is in agreement with them.  She knows to contact our office if she develops concerns prior to her next appointment. ? ? ? ? ?Derwood Kaplan, MD  ?[facility] ?[phone]  [place] ?[place] [address] ?Dept: [phone] ?Dept Fax: [phone]  ? ? ? ? ?CHIEF COMPLAINT:  ?CC: Recurrent small cell lung cancer ? ?Current Treatment: Maintenance atezolizumab ? ?HISTORY OF PRESENT ILLNESS:  ? ?Oncology History  ?Small cell carcinoma of lower lobe of left lung ([place])  ?01/08/2020 Imaging  ? CTA CHEST WITH CONTRAST: ?No evidence of pulmonary embolus.  ? ?Tumor encases and narrows the left lower lobe pulmonary artery in the  inferior left hilum measuring up to 5.2 cm. This is increased from 1.9 cm previously.  ? ?Irregular calcified and noncalcified plaque in the descending thoracic aorta. Coronary artery disease.  ? ?Left lower lobe atelectasis.  ? ?Aortic Atherosclerosis (ICD10-I70.0) and Emphysema (ICD10-J43.9).  ?  ?01/11/2020 Pathology Results  ? LEFT LOWER LOBE LUNG MASS, BIOPSY: ? ?             SMALL CELL CARCINOMA WITH CRUSH ARTIFACT ?  ?01/20/2020 Initial Diagnosis  ? Lung cancer, lower lobe ([place]) ?  ?01/21/2020 Cancer Staging  ? Staging form: Lung, AJCC 8th Edition ?- Pathologic stage from 01/21/2020: Stage IIIB (pT3, pN2, cM0) - Signed by Derwood Kaplan, MD on 01/21/2020 ? ?  ?01/31/2020 - 04/28/2020 Chemotherapy  ? Carboplatin/etoposide ? ? ?  ? ?  ?06/08/2020 - 10/13/2020 Chemotherapy  ? Nivolumab ? ? ?  ? ?  ?08/28/2020 Imaging  ? CT CHEST, ABDOMEN AND PELVIS WITHOUT CONTRAST: ?1. Progressive soft tissue thickening between the aorta and the left ?mainstem bronchus suspicious for recurrent tumor. PET-CT may be ?helpful for further evaluation if clinically indicated. ?2. Stable radiation changes involving the right hilum and right ?lower lobe. ?3. Stable severe emphysematous changes and pulmonary scarring. ?4. Stable right hepatic lobe lesion. ?5. Stable scattered sclerotic bone lesions. ?6. Stable advanced atherosclerotic calcifications involving the ?thoracic and abdominal aorta and branch vessels including the ?coronary arteries. ?7. Chronic bilateral hip AVN. ?8. Emphysema and aortic atherosclerosis. ?  ?10/15/2020 Imaging  ? CTA CHEST WITH CONTRAST: ?1. No evidence of a pulmonary embolism. ?2. No acute findings. ?3. Soft tissue mass adjacent to the left peripheral mainstem ?bronchus has increased in size from the prior CT concerning for ?recurrence or progression of carcinoma. ?4. No other change. ?5. Advanced emphysema, coronary artery calcifications and aortic ?atherosclerosis. ?  ?12/22/2020 Imaging  ? CT CHEST, ABDOMEN  AND PELVIS WITH CONTRAST: ?1. Interval mixed response to therapy. ?2. There has been interval resolution of left hilar adenopathy. ?Masslike architectural distortion within the superior segment of ?right lower lobe is slightly decreased in size in the interval. ?3. Interval enlargement of subcarinal lymph node concerning for ?metastatic adenopathy. ?4. There are several new, low-attenuation lesions within both lobes ?of liver. Worrisome for metastatic disease. ?5. Stable appearance of multifocal sclerotic bone metastases. ?6. Aortic Atherosclerosis (ICD10-I70.0) and Emphysema (ICD10-J43.9). ?  ?12/25/2020 -  Chemotherapy  ? Patient is on Treatment Plan : LUNG SCLC Carboplatin + Etoposide + Atezolizumab Induction q21d / Atezolizumab Maintenance q21d  ?   ?03/20/2021 Imaging  ? CT CHEST, ABDOMEN AND PELVIS WITH CONTRAST: ?Stable post treatment changes in the right hilum and superior RIGHT  ?mediastinal border.  ?Decreased size of subcarinal lymph node.  ?Stable small bilateral pulmonary nodules.  ?Decreased size of the dominant hepatic lesion, smaller hepatic  ?lesion seen on the prior study is no longer visible on today's exam.  ?No change in the appearance of multifocal areas of bony metastatic  ?disease with sclerosis.  ?Bilateral femoral AVN of the femoral heads.  ?Emphysema  and aortic atherosclerosis.  ?Aortic Atherosclerosis (ICD10-I70.0) and Emphysema (ICD10-J43.9).  ?  ?Secondary malignant neoplasm of bone and bone marrow ([place])  ?05/29/2020 Imaging  ? CT CHEST, ABDOMEN AND PELVIS WITH CONTRAST: ?1. New osseous metastatic disease. ?2. Post radiation changes in the right hemithorax, stable. ?3. Aortic atherosclerosis (ICD10-I70.0). Coronary artery ?calcification. ?4. Emphysema (ICD10-J43.9). ?  ?  ? ? ?INTERVAL HISTORY:  ?Gabrielle Hudson completed 4 cycles of carboplatin/etoposide/atezolizumab. CT chest, abdomen and pelvis from December 27th revealed stable post treatment changes in the right hilum and superior right  mediastinal border. Decreased size of subcarinal lymph node now measuring 7 mm, previously 16 mm. Stable small bilateral pulmonary nodules. Decreased size of the dominant hepatic lesion, measuring 2.2 x 2.4

## 2021-06-14 ENCOUNTER — Inpatient Hospital Stay: Payer: Medicare Other

## 2021-06-14 VITALS — BP 128/51 | HR 81 | Temp 97.4°F | Resp 18 | Ht 63.0 in | Wt 229.0 lb

## 2021-06-14 DIAGNOSIS — Z923 Personal history of irradiation: Secondary | ICD-10-CM | POA: Diagnosis not present

## 2021-06-14 DIAGNOSIS — C3432 Malignant neoplasm of lower lobe, left bronchus or lung: Secondary | ICD-10-CM

## 2021-06-14 DIAGNOSIS — C3411 Malignant neoplasm of upper lobe, right bronchus or lung: Secondary | ICD-10-CM | POA: Diagnosis not present

## 2021-06-14 DIAGNOSIS — D649 Anemia, unspecified: Secondary | ICD-10-CM | POA: Diagnosis not present

## 2021-06-14 DIAGNOSIS — E222 Syndrome of inappropriate secretion of antidiuretic hormone: Secondary | ICD-10-CM

## 2021-06-14 DIAGNOSIS — C787 Secondary malignant neoplasm of liver and intrahepatic bile duct: Secondary | ICD-10-CM | POA: Diagnosis not present

## 2021-06-14 DIAGNOSIS — E039 Hypothyroidism, unspecified: Secondary | ICD-10-CM | POA: Diagnosis not present

## 2021-06-14 DIAGNOSIS — E871 Hypo-osmolality and hyponatremia: Secondary | ICD-10-CM

## 2021-06-14 DIAGNOSIS — C7951 Secondary malignant neoplasm of bone: Secondary | ICD-10-CM | POA: Diagnosis not present

## 2021-06-14 DIAGNOSIS — Z79899 Other long term (current) drug therapy: Secondary | ICD-10-CM | POA: Diagnosis not present

## 2021-06-14 DIAGNOSIS — Z86711 Personal history of pulmonary embolism: Secondary | ICD-10-CM | POA: Diagnosis not present

## 2021-06-14 DIAGNOSIS — Z7901 Long term (current) use of anticoagulants: Secondary | ICD-10-CM | POA: Diagnosis not present

## 2021-06-14 DIAGNOSIS — Z5112 Encounter for antineoplastic immunotherapy: Secondary | ICD-10-CM | POA: Diagnosis not present

## 2021-06-14 MED ORDER — SODIUM CHLORIDE 0.9 % IV SOLN: Freq: Once | INTRAVENOUS | Status: AC

## 2021-06-14 MED ORDER — SODIUM CHLORIDE 0.9 % IV SOLN
1200.0000 mg | Freq: Once | INTRAVENOUS | Status: AC
  Administered 2021-06-14: 1200 mg via INTRAVENOUS
  Filled 2021-06-14: qty 20

## 2021-06-14 MED ORDER — HEPARIN SOD (PORK) LOCK FLUSH 100 UNIT/ML IV SOLN
500.0000 [IU] | Freq: Once | INTRAVENOUS | Status: AC | PRN
  Administered 2021-06-14: 500 [IU]

## 2021-06-14 MED ORDER — SODIUM CHLORIDE 0.9% FLUSH
10.0000 mL | INTRAVENOUS | Status: DC | PRN
  Administered 2021-06-14: 10 mL

## 2021-06-14 NOTE — Patient Instructions (Signed)
Atezolizumab injection ?What is this medication? ?ATEZOLIZUMAB (a te zoe LIZ ue mab) is a monoclonal antibody. It is used to treat bladder cancer (urothelial cancer), liver cancer, lung cancer, and melanoma. ?This medicine may be used for other purposes; ask your health care provider or pharmacist if you have questions. ?COMMON BRAND NAME(S): Tecentriq ?What should I tell my care team before I take this medication? ?They need to know if you have any of these conditions: ?autoimmune diseases like Crohn's disease, ulcerative colitis, or lupus ?have had or planning to have an allogeneic stem cell transplant (uses someone else's stem cells) ?history of organ transplant ?history of radiation to the chest ?nervous system problems like myasthenia gravis or Guillain-Barre syndrome ?an unusual or allergic reaction to atezolizumab, other medicines, foods, dyes, or preservatives ?pregnant or trying to get pregnant ?breast-feeding ?How should I use this medication? ?This medicine is for infusion into a vein. It is given by a health care professional in a hospital or clinic setting. ?A special MedGuide will be given to you before each treatment. Be sure to read this information carefully each time. ?Talk to your pediatrician regarding the use of this medicine in children. Special care may be needed. ?Overdosage: If you think you have taken too much of this medicine contact a poison control center or emergency room at once. ?NOTE: This medicine is only for you. Do not share this medicine with others. ?What if I miss a dose? ?It is important not to miss your dose. Call your doctor or health care professional if you are unable to keep an appointment. ?What may interact with this medication? ?Interactions have not been studied. ?This list may not describe all possible interactions. Give your health care provider a list of all the medicines, herbs, non-prescription drugs, or dietary supplements you use. Also tell them if you smoke,  drink alcohol, or use illegal drugs. Some items may interact with your medicine. ?What should I watch for while using this medication? ?Your condition will be monitored carefully while you are receiving this medicine. ?You may need blood work done while you are taking this medicine. ?Do not become pregnant while taking this medicine or for at least 5 months after stopping it. Women should inform their doctor if they wish to become pregnant or think they might be pregnant. There is a potential for serious side effects to an unborn child. Talk to your health care professional or pharmacist for more information. Do not breast-feed an infant while taking this medicine or for at least 5 months after the last dose. ?What side effects may I notice from receiving this medication? ?Side effects that you should report to your doctor or health care professional as soon as possible: ?allergic reactions like skin rash, itching or hives, swelling of the face, lips, or tongue ?black, tarry stools ?bloody or watery diarrhea ?breathing problems ?changes in vision ?chest pain or chest tightness ?chills ?facial flushing ?fever ?headache ?signs and symptoms of high blood sugar such as dizziness; dry mouth; dry skin; fruity breath; nausea; stomach pain; increased hunger or thirst; increased urination ?signs and symptoms of liver injury like dark yellow or brown urine; general ill feeling or flu-like symptoms; light-colored stools; loss of appetite; nausea; right upper belly pain; unusually weak or tired; yellowing of the eyes or skin ?stomach pain ?trouble passing urine or change in the amount of urine ?Side effects that usually do not require medical attention (report to your doctor or health care professional if they continue or are  bothersome): ?bone pain ?cough ?diarrhea ?joint pain ?muscle pain ?muscle weakness ?swelling of arms or legs ?tiredness ?weight loss ?This list may not describe all possible side effects. Call your doctor  for medical advice about side effects. You may report side effects to FDA at 1-800-FDA-1088. ?Where should I keep my medication? ?This drug is given in a hospital or clinic and will not be stored at home. ?NOTE: This sheet is a summary. It may not cover all possible information. If you have questions about this medicine, talk to your doctor, pharmacist, or health care provider. ?? 2022 Elsevier/Gold Standard (2020-11-28 00:00:00) ? ?

## 2021-06-15 LAB — T4: T4, Total: 9.1 ug/dL (ref 4.5–12.0)

## 2021-06-22 DIAGNOSIS — J449 Chronic obstructive pulmonary disease, unspecified: Secondary | ICD-10-CM | POA: Diagnosis not present

## 2021-06-22 DIAGNOSIS — I10 Essential (primary) hypertension: Secondary | ICD-10-CM | POA: Diagnosis not present

## 2021-06-22 DIAGNOSIS — E785 Hyperlipidemia, unspecified: Secondary | ICD-10-CM | POA: Diagnosis not present

## 2021-06-25 NOTE — Progress Notes (Signed)
Sent in request for DOS 04/12 and 04/13 to [employer].  ?

## 2021-06-28 ENCOUNTER — Encounter: Payer: Self-pay | Admitting: Oncology

## 2021-06-29 ENCOUNTER — Other Ambulatory Visit: Payer: Self-pay | Admitting: Oncology

## 2021-06-29 ENCOUNTER — Other Ambulatory Visit: Payer: Self-pay | Admitting: Pharmacist

## 2021-06-29 DIAGNOSIS — J449 Chronic obstructive pulmonary disease, unspecified: Secondary | ICD-10-CM | POA: Diagnosis not present

## 2021-06-30 ENCOUNTER — Encounter: Payer: Self-pay | Admitting: Oncology

## 2021-07-04 ENCOUNTER — Encounter: Payer: Self-pay | Admitting: Hematology and Oncology

## 2021-07-04 ENCOUNTER — Other Ambulatory Visit: Payer: Self-pay

## 2021-07-04 ENCOUNTER — Inpatient Hospital Stay: Payer: Medicare Other | Attending: Hematology and Oncology | Admitting: Hematology and Oncology

## 2021-07-04 ENCOUNTER — Inpatient Hospital Stay: Payer: Medicare Other

## 2021-07-04 DIAGNOSIS — C7951 Secondary malignant neoplasm of bone: Secondary | ICD-10-CM

## 2021-07-04 DIAGNOSIS — E871 Hypo-osmolality and hyponatremia: Secondary | ICD-10-CM

## 2021-07-04 DIAGNOSIS — E039 Hypothyroidism, unspecified: Secondary | ICD-10-CM | POA: Diagnosis not present

## 2021-07-04 DIAGNOSIS — C7952 Secondary malignant neoplasm of bone marrow: Secondary | ICD-10-CM | POA: Diagnosis not present

## 2021-07-04 DIAGNOSIS — C3432 Malignant neoplasm of lower lobe, left bronchus or lung: Secondary | ICD-10-CM | POA: Diagnosis not present

## 2021-07-04 DIAGNOSIS — Z79899 Other long term (current) drug therapy: Secondary | ICD-10-CM | POA: Insufficient documentation

## 2021-07-04 DIAGNOSIS — E032 Hypothyroidism due to medicaments and other exogenous substances: Secondary | ICD-10-CM

## 2021-07-04 DIAGNOSIS — Z86711 Personal history of pulmonary embolism: Secondary | ICD-10-CM | POA: Diagnosis not present

## 2021-07-04 DIAGNOSIS — Z79890 Hormone replacement therapy (postmenopausal): Secondary | ICD-10-CM | POA: Insufficient documentation

## 2021-07-04 DIAGNOSIS — C787 Secondary malignant neoplasm of liver and intrahepatic bile duct: Secondary | ICD-10-CM | POA: Diagnosis not present

## 2021-07-04 DIAGNOSIS — Z923 Personal history of irradiation: Secondary | ICD-10-CM | POA: Diagnosis not present

## 2021-07-04 DIAGNOSIS — I2782 Chronic pulmonary embolism: Secondary | ICD-10-CM | POA: Diagnosis not present

## 2021-07-04 DIAGNOSIS — D649 Anemia, unspecified: Secondary | ICD-10-CM | POA: Diagnosis not present

## 2021-07-04 DIAGNOSIS — C3411 Malignant neoplasm of upper lobe, right bronchus or lung: Secondary | ICD-10-CM | POA: Insufficient documentation

## 2021-07-04 DIAGNOSIS — Z7901 Long term (current) use of anticoagulants: Secondary | ICD-10-CM | POA: Diagnosis not present

## 2021-07-04 DIAGNOSIS — Z5112 Encounter for antineoplastic immunotherapy: Secondary | ICD-10-CM | POA: Insufficient documentation

## 2021-07-04 DIAGNOSIS — Z85118 Personal history of other malignant neoplasm of bronchus and lung: Secondary | ICD-10-CM | POA: Diagnosis not present

## 2021-07-04 DIAGNOSIS — E222 Syndrome of inappropriate secretion of antidiuretic hormone: Secondary | ICD-10-CM

## 2021-07-04 LAB — CBC AND DIFFERENTIAL
HCT: 36 (ref 36–46)
Hemoglobin: 11.7 — AB (ref 12.0–16.0)
Neutrophils Absolute: 4.5
Platelets: 239 10*3/uL (ref 150–400)
WBC: 6

## 2021-07-04 LAB — BASIC METABOLIC PANEL
BUN: 21 (ref 4–21)
CO2: 31 — AB (ref 13–22)
Chloride: 92 — AB (ref 99–108)
Creatinine: 1 (ref 0.5–1.1)
Glucose: 134
Potassium: 4.2 mEq/L (ref 3.5–5.1)
Sodium: 132 — AB (ref 137–147)

## 2021-07-04 LAB — HEPATIC FUNCTION PANEL
ALT: 27 U/L (ref 7–35)
AST: 33 (ref 13–35)
Alkaline Phosphatase: 127 — AB (ref 25–125)
Bilirubin, Total: 0.4

## 2021-07-04 LAB — COMPREHENSIVE METABOLIC PANEL
Albumin: 4.3 (ref 3.5–5.0)
Calcium: 9 (ref 8.7–10.7)

## 2021-07-04 LAB — TSH: TSH: 3.693 u[IU]/mL (ref 0.350–4.500)

## 2021-07-04 LAB — CBC: RBC: 4.18 (ref 3.87–5.11)

## 2021-07-04 MED ORDER — LEVOTHYROXINE SODIUM 100 MCG PO TABS: 100.0000 ug | ORAL_TABLET | Freq: Every day | ORAL | 5 refills | Status: AC

## 2021-07-04 MED ORDER — APIXABAN 5 MG PO TABS: 5.0000 mg | ORAL_TABLET | Freq: Two times a day (BID) | ORAL | 5 refills | Status: AC

## 2021-07-04 NOTE — Progress Notes (Addendum)
?Patient Care Team: ?Helen Hashimoto., MD as PCP - General (Internal Medicine) ?Gardiner Rhyme, MD as Attending Physician (Pulmonary Disease) ?Governor Rooks., DO as Attending Physician (General Surgery) ? ?Clinic Day:  07/04/21 ? ?Referring physician: Helen Hashimoto., MD ? ?ASSESSMENT & PLAN:  ? ?Assessment & Plan: ?Small cell carcinoma of lower lobe of left lung ([place]) ?Small cell carcinoma of the lung, at least a clinical stage IIIB, diagnosed in October 2021.   She was initially treated with chemotherapy with carboplatin/etoposide with a good response.  She was then switched to nivolumab. However, she was found to have recent progression on disease in August 2022.  She underwent palliative radiation therapy, which she completed in August. She then received palliative chemotherapy consisting of carboplatin/etoposide/atezolizumab and completed 4 cycles. CT imaging from December has shown a positive response. She is now on maintenance atezolizumab and tolerating well. ?  ? ?Secondary malignant neoplasm of bone and bone marrow ([place]) ?Bone metastases at L2 and L4 vertebral bodies, left sacrum and left ischium were found in March of 2022, as well as mild changes of avascular necrosis in both femoral heads. She was placed on zoledronic acid in March 2022.  She developed severe hypocalcemia with her 1st dose and so we have been administering this every 3 months with the last dose in December 2022. She has been held due to low calcium. Today, she is 9.0, so we can continue with dosing as scheduled.  ? ?H/O: lung cancer ?Treated with chemoradiation ?  ? ?The patient understands the plans discussed today and is in agreement with them.  She knows to contact our office if she develops concerns prior to her next appointment. ? ? ? ? ?Derwood Kaplan, MD  ?[facility] ?[address] ?[place] [address] ?Dept: [phone] ?Dept Fax:  [phone]  ? ?Orders Placed This Encounter  ?Procedures  ? CBC and differential  ?  This external order was created through the Results Console.  ? CBC  ?  This external order was created through the Results Console.  ? Basic metabolic panel  ?  This external order was created through the Results Console.  ? Comprehensive metabolic panel  ?  This external order was created through the Results Console.  ? Hepatic function panel  ?  This external order was created through the Results Console.  ?  ? ? ?CHIEF COMPLAINT:  ?CC: An 81 year old female with history of small cell lung cancer here for 3 week evaluation ? ?Current Treatment:  Atezolizumab ? ?INTERVAL HISTORY:  ?Gabrielle Hudson is here today for repeat clinical assessment. She denies fevers or chills. She denies pain. Her appetite is good. Her weight has been stable. ? ?I have reviewed the past medical history, past surgical history, social history and family history with the patient and they are unchanged from previous note. ? ?ALLERGIES:  is allergic to nitroglycerin in d5w and nitroglycerin. ? ?MEDICATIONS:  ?Current Outpatient Medications  ?Medication Sig Dispense Refill  ? albuterol (PROVENTIL) (2.5 MG/3ML) 0.083% nebulizer solution SMARTSIG:1 Vial(s) Via Nebulizer 4 Times Daily PRN    ? albuterol (VENTOLIN HFA) 108 (90 Base) MCG/ACT inhaler Inhale into the lungs every 6 (six) hours as needed for wheezing or shortness of breath.    ? apixaban (ELIQUIS) 5 MG TABS tablet Take 1 tablet (5 mg total) by mouth 2 (two) times daily. 60 tablet 5  ? budesonide-formoterol (SYMBICORT) 160-4.5 MCG/ACT inhaler Inhale 2 puffs  into the lungs 2 (two) times daily.      ? calcium citrate-vitamin D (CITRACAL+D) 315-200 MG-UNIT tablet Take 1 tablet by mouth 2 (two) times daily.    ? Cholecalciferol (VITAMIN D3) 1.25 MG (50000 UT) CAPS Take 1.25 mg by mouth once a week. 12 capsule 3  ? cyclobenzaprine (FLEXERIL) 10 MG tablet Take 1 tablet (10 mg total) by mouth 3 (three) times daily  as needed for muscle spasms. 30 tablet 3  ? diltiazem (CARDIZEM CD) 120 MG 24 hr capsule Take 120 mg by mouth daily.    ? famotidine (PEPCID) 20 MG tablet Take 20 mg by mouth 2 (two) times daily.    ? furosemide (LASIX) 20 MG tablet Take 20 mg by mouth as needed. ONLY TAKES IT HAVING SWELLING, can take 2 tablets it more than a 3 lb weight gain ever night    ? gabapentin (NEURONTIN) 300 MG capsule Take 1 capsule (300 mg total) by mouth 2 (two) times daily. 60 capsule 4  ? HYDROcodone-acetaminophen (NORCO/VICODIN) 5-325 MG tablet Take 1 tablet by mouth every 4 (four) hours as needed for moderate pain. 30 tablet 0  ? levothyroxine (SYNTHROID) 100 MCG tablet Take 1 tablet (100 mcg total) by mouth daily before breakfast. 30 tablet 5  ? loratadine (CLARITIN) 10 MG tablet Take 10 mg by mouth daily.    ? meclizine (ANTIVERT) 25 MG tablet Take 0.5-1 tablets (12.5-25 mg total) by mouth 3 (three) times daily as needed for dizziness. 120 tablet 3  ? OXYGEN Inhale 3 L into the lungs continuous.    ? Potassium Chloride ER 20 MEQ TBCR Take 1 tablet by mouth 2 (two) times daily. 60 tablet 5  ? rOPINIRole (REQUIP) 0.5 MG tablet Take 0.5 mg by mouth at bedtime.    ? ?No current facility-administered medications for this visit.  ? ? ?HISTORY OF PRESENT ILLNESS:  ? ?Oncology History  ?Small cell carcinoma of lower lobe of left lung ([place])  ?01/08/2020 Imaging  ? CTA CHEST WITH CONTRAST: ?No evidence of pulmonary embolus.  ? ?Tumor encases and narrows the left lower lobe pulmonary artery in the inferior left hilum measuring up to 5.2 cm. This is increased from 1.9 cm previously.  ? ?Irregular calcified and noncalcified plaque in the descending thoracic aorta. Coronary artery disease.  ? ?Left lower lobe atelectasis.  ? ?Aortic Atherosclerosis (ICD10-I70.0) and Emphysema (ICD10-J43.9).  ?  ?01/11/2020 Pathology Results  ? LEFT LOWER LOBE LUNG MASS, BIOPSY: ? ?             SMALL CELL CARCINOMA WITH CRUSH ARTIFACT ?  ?01/20/2020 Initial  Diagnosis  ? Lung cancer, lower lobe ([place]) ? ?  ?01/21/2020 Cancer Staging  ? Staging form: Lung, AJCC 8th Edition ?- Pathologic stage from 01/21/2020: Stage IIIB (pT3, pN2, cM0) - Signed by Derwood Kaplan, MD on 01/21/2020 ? ?  ?01/31/2020 - 04/28/2020 Chemotherapy  ? Carboplatin/etoposide ? ? ?  ? ?  ?06/08/2020 - 10/13/2020 Chemotherapy  ? Nivolumab ? ? ?  ? ?  ?08/28/2020 Imaging  ? CT CHEST, ABDOMEN AND PELVIS WITHOUT CONTRAST: ?1. Progressive soft tissue thickening between the aorta and the left ?mainstem bronchus suspicious for recurrent tumor. PET-CT may be ?helpful for further evaluation if clinically indicated. ?2. Stable radiation changes involving the right hilum and right ?lower lobe. ?3. Stable severe emphysematous changes and pulmonary scarring. ?4. Stable right hepatic lobe lesion. ?5. Stable scattered sclerotic bone lesions. ?6. Stable advanced atherosclerotic calcifications involving the ?thoracic and  abdominal aorta and branch vessels including the ?coronary arteries. ?7. Chronic bilateral hip AVN. ?8. Emphysema and aortic atherosclerosis. ?  ?10/15/2020 Imaging  ? CTA CHEST WITH CONTRAST: ?1. No evidence of a pulmonary embolism. ?2. No acute findings. ?3. Soft tissue mass adjacent to the left peripheral mainstem ?bronchus has increased in size from the prior CT concerning for ?recurrence or progression of carcinoma. ?4. No other change. ?5. Advanced emphysema, coronary artery calcifications and aortic ?atherosclerosis. ?  ?12/22/2020 Imaging  ? CT CHEST, ABDOMEN AND PELVIS WITH CONTRAST: ?1. Interval mixed response to therapy. ?2. There has been interval resolution of left hilar adenopathy. ?Masslike architectural distortion within the superior segment of ?right lower lobe is slightly decreased in size in the interval. ?3. Interval enlargement of subcarinal lymph node concerning for ?metastatic adenopathy. ?4. There are several new, low-attenuation lesions within both lobes ?of liver. Worrisome for  metastatic disease. ?5. Stable appearance of multifocal sclerotic bone metastases. ?6. Aortic Atherosclerosis (ICD10-I70.0) and Emphysema (ICD10-J43.9). ?  ?12/25/2020 -  Chemotherapy  ? Patient is on Treatment Plan

## 2021-07-04 NOTE — Assessment & Plan Note (Signed)
Small cell carcinoma of the lung, at least a clinical stage IIIB, diagnosed in October 2021. ??She was initially treated with chemotherapy with carboplatin/etoposide with a good response. ?She was then switched to nivolumab.?However, she?was found to have recent progression on disease in August 2022. ?She underwent palliative radiation therapy, which she completed in August.?She then received palliative?chemotherapy consisting of carboplatin/etoposide/atezolizumab?and completed 4 cycles.?CT imaging from December has shown a positive response. She is now on maintenance atezolizumab and tolerating well. ?? ?

## 2021-07-04 NOTE — Assessment & Plan Note (Signed)
Treated with chemoradiation ?

## 2021-07-04 NOTE — Assessment & Plan Note (Signed)
Bone metastases at L2 and L4 vertebral bodies, left sacrum and left ischium were found in March of 2022, as well as mild changes of avascular necrosis in both femoral heads. She was placed on zoledronic acid in [place]. ?She developed severe hypocalcemia with her 1st dose and so we have been administering this every 3 months with the last dose in December 2022. She has been held due to low calcium. Today, she is 9.0, so we can continue with dosing as scheduled.  ?

## 2021-07-05 ENCOUNTER — Inpatient Hospital Stay: Payer: Medicare Other

## 2021-07-05 VITALS — BP 166/81 | HR 90 | Temp 99.3°F | Resp 18 | Ht 63.0 in | Wt 225.0 lb

## 2021-07-05 DIAGNOSIS — E871 Hypo-osmolality and hyponatremia: Secondary | ICD-10-CM

## 2021-07-05 DIAGNOSIS — C3411 Malignant neoplasm of upper lobe, right bronchus or lung: Secondary | ICD-10-CM | POA: Diagnosis not present

## 2021-07-05 DIAGNOSIS — C3432 Malignant neoplasm of lower lobe, left bronchus or lung: Secondary | ICD-10-CM

## 2021-07-05 DIAGNOSIS — Z5112 Encounter for antineoplastic immunotherapy: Secondary | ICD-10-CM | POA: Diagnosis not present

## 2021-07-05 DIAGNOSIS — Z86711 Personal history of pulmonary embolism: Secondary | ICD-10-CM | POA: Diagnosis not present

## 2021-07-05 DIAGNOSIS — E039 Hypothyroidism, unspecified: Secondary | ICD-10-CM | POA: Diagnosis not present

## 2021-07-05 DIAGNOSIS — Z923 Personal history of irradiation: Secondary | ICD-10-CM | POA: Diagnosis not present

## 2021-07-05 DIAGNOSIS — Z7901 Long term (current) use of anticoagulants: Secondary | ICD-10-CM | POA: Diagnosis not present

## 2021-07-05 DIAGNOSIS — C7951 Secondary malignant neoplasm of bone: Secondary | ICD-10-CM | POA: Diagnosis not present

## 2021-07-05 DIAGNOSIS — E222 Syndrome of inappropriate secretion of antidiuretic hormone: Secondary | ICD-10-CM

## 2021-07-05 DIAGNOSIS — Z79899 Other long term (current) drug therapy: Secondary | ICD-10-CM | POA: Diagnosis not present

## 2021-07-05 DIAGNOSIS — C787 Secondary malignant neoplasm of liver and intrahepatic bile duct: Secondary | ICD-10-CM | POA: Diagnosis not present

## 2021-07-05 LAB — T4: T4, Total: 9.3 ug/dL (ref 4.5–12.0)

## 2021-07-05 LAB — CEA: CEA: 33.9 ng/mL — ABNORMAL HIGH (ref 0.0–4.7)

## 2021-07-05 MED ORDER — SODIUM CHLORIDE 0.9 % IV SOLN
1200.0000 mg | Freq: Once | INTRAVENOUS | Status: AC
  Administered 2021-07-05: 1200 mg via INTRAVENOUS
  Filled 2021-07-05: qty 20

## 2021-07-05 MED ORDER — HEPARIN SOD (PORK) LOCK FLUSH 100 UNIT/ML IV SOLN
500.0000 [IU] | Freq: Once | INTRAVENOUS | Status: AC | PRN
  Administered 2021-07-05: 500 [IU]

## 2021-07-05 MED ORDER — SODIUM CHLORIDE 0.9% FLUSH
10.0000 mL | INTRAVENOUS | Status: DC | PRN
  Administered 2021-07-05: 10 mL

## 2021-07-05 MED ORDER — SODIUM CHLORIDE 0.9 % IV SOLN: Freq: Once | INTRAVENOUS | Status: AC

## 2021-07-05 NOTE — Patient Instructions (Signed)
:  umab injection ?What is this medication? ?ATEZOLIZUMAB (a te zoe LIZ ue mab) is a monoclonal antibody. It is used to treat bladder cancer (urothelial cancer), liver cancer, lung cancer, and melanoma. ?This medicine may be used for other purposes; ask your health care provider or pharmacist if you have questions. ?COMMON BRAND NAME(S): Tecentriq ?What should I tell my care team before I take this medication? ?They need to know if you have any of these conditions: ?autoimmune diseases like Crohn's disease, ulcerative colitis, or lupus ?have had or planning to have an allogeneic stem cell transplant (uses someone else's stem cells) ?history of organ transplant ?history of radiation to the chest ?nervous system problems like myasthenia gravis or Guillain-Barre syndrome ?an unusual or allergic reaction to atezolizumab, other medicines, foods, dyes, or preservatives ?pregnant or trying to get pregnant ?breast-feeding ?How should I use this medication? ?This medicine is for infusion into a vein. It is given by a health care professional in a hospital or clinic setting. ?A special MedGuide will be given to you before each treatment. Be sure to read this information carefully each time. ?Talk to your pediatrician regarding the use of this medicine in children. Special care may be needed. ?Overdosage: If you think you have taken too much of this medicine contact a poison control center or emergency room at once. ?NOTE: This medicine is only for you. Do not share this medicine with others. ?What if I miss a dose? ?It is important not to miss your dose. Call your doctor or health care professional if you are unable to keep an appointment. ?What may interact with this medication? ?Interactions have not been studied. ?This list may not describe all possible interactions. Give your health care provider a list of all the medicines, herbs, non-prescription drugs, or dietary supplements you use. Also tell them if you smoke, drink  alcohol, or use illegal drugs. Some items may interact with your medicine. ?What should I watch for while using this medication? ?Your condition will be monitored carefully while you are receiving this medicine. ?You may need blood work done while you are taking this medicine. ?Do not become pregnant while taking this medicine or for at least 5 months after stopping it. Women should inform their doctor if they wish to become pregnant or think they might be pregnant. There is a potential for serious side effects to an unborn child. Talk to your health care professional or pharmacist for more information. Do not breast-feed an infant while taking this medicine or for at least 5 months after the last dose. ?What side effects may I notice from receiving this medication? ?Side effects that you should report to your doctor or health care professional as soon as possible: ?allergic reactions like skin rash, itching or hives, swelling of the face, lips, or tongue ?black, tarry stools ?bloody or watery diarrhea ?breathing problems ?changes in vision ?chest pain or chest tightness ?chills ?facial flushing ?fever ?headache ?signs and symptoms of high blood sugar such as dizziness; dry mouth; dry skin; fruity breath; nausea; stomach pain; increased hunger or thirst; increased urination ?signs and symptoms of liver injury like dark yellow or brown urine; general ill feeling or flu-like symptoms; light-colored stools; loss of appetite; nausea; right upper belly pain; unusually weak or tired; yellowing of the eyes or skin ?stomach pain ?trouble passing urine or change in the amount of urine ?Side effects that usually do not require medical attention (report to your doctor or health care professional if they continue  or are bothersome): ?bone pain ?cough ?diarrhea ?joint pain ?muscle pain ?muscle weakness ?swelling of arms or legs ?tiredness ?weight loss ?This list may not describe all possible side effects. Call your doctor for  medical advice about side effects. You may report side effects to FDA at 1-800-FDA-1088. ?Where should I keep my medication? ?This drug is given in a hospital or clinic and will not be stored at home. ?NOTE: This sheet is a summary. It may not cover all possible information. If you have questions about this medicine, talk to your doctor, pharmacist, or health care provider. ?? 2022 Elsevier/Gold Standard (2020-11-28 00:00:00) ? ?

## 2021-07-09 ENCOUNTER — Encounter: Payer: Self-pay | Admitting: Oncology

## 2021-07-11 ENCOUNTER — Telehealth: Payer: Self-pay | Admitting: Oncology

## 2021-07-11 NOTE — Telephone Encounter (Signed)
CT Chest/Abd/Pel scheduled for 07/23/21 @ 2 pm ; check in @ 1. ? ?Lvm notifying patient of appt info and instructions. ?

## 2021-07-12 DIAGNOSIS — J449 Chronic obstructive pulmonary disease, unspecified: Secondary | ICD-10-CM | POA: Diagnosis not present

## 2021-07-12 DIAGNOSIS — C7951 Secondary malignant neoplasm of bone: Secondary | ICD-10-CM | POA: Diagnosis not present

## 2021-07-12 DIAGNOSIS — E222 Syndrome of inappropriate secretion of antidiuretic hormone: Secondary | ICD-10-CM | POA: Diagnosis not present

## 2021-07-12 DIAGNOSIS — I10 Essential (primary) hypertension: Secondary | ICD-10-CM | POA: Diagnosis not present

## 2021-07-12 DIAGNOSIS — J9611 Chronic respiratory failure with hypoxia: Secondary | ICD-10-CM | POA: Diagnosis not present

## 2021-07-16 NOTE — Progress Notes (Signed)
Sent in request for DOS 05/01, 05/03, and 05/05 to [employer].  ?

## 2021-07-18 ENCOUNTER — Ambulatory Visit: Payer: 59 | Admitting: Hematology and Oncology

## 2021-07-18 ENCOUNTER — Other Ambulatory Visit: Payer: 59

## 2021-07-20 ENCOUNTER — Other Ambulatory Visit: Payer: Self-pay | Admitting: Hematology and Oncology

## 2021-07-20 ENCOUNTER — Ambulatory Visit: Payer: 59

## 2021-07-20 DIAGNOSIS — E876 Hypokalemia: Secondary | ICD-10-CM

## 2021-07-20 DIAGNOSIS — C7951 Secondary malignant neoplasm of bone: Secondary | ICD-10-CM

## 2021-07-20 MED ORDER — GABAPENTIN 300 MG PO CAPS: 300.0000 mg | ORAL_CAPSULE | Freq: Two times a day (BID) | ORAL | 4 refills | Status: AC

## 2021-07-20 MED ORDER — CALCIUM CARB-CHOLECALCIFEROL 600-20 MG-MCG PO TABS: 1.0000 | ORAL_TABLET | Freq: Every day | ORAL | 3 refills | Status: AC

## 2021-07-20 MED ORDER — POTASSIUM CHLORIDE ER 20 MEQ PO TBCR: 1.0000 | EXTENDED_RELEASE_TABLET | Freq: Two times a day (BID) | ORAL | 5 refills | Status: AC

## 2021-07-20 MED ORDER — CYCLOBENZAPRINE HCL 10 MG PO TABS: 10.0000 mg | ORAL_TABLET | Freq: Three times a day (TID) | ORAL | 3 refills | Status: AC | PRN

## 2021-07-20 MED ORDER — MECLIZINE HCL 25 MG PO TABS: 12.5000 mg | ORAL_TABLET | Freq: Three times a day (TID) | ORAL | 3 refills | Status: AC | PRN

## 2021-07-24 ENCOUNTER — Telehealth: Payer: Self-pay | Admitting: Oncology

## 2021-07-24 NOTE — Telephone Encounter (Signed)
07/24/21 LVM to reschedule CT SCANS ?

## 2021-07-25 ENCOUNTER — Telehealth: Payer: Self-pay | Admitting: Oncology

## 2021-07-25 ENCOUNTER — Ambulatory Visit: Payer: 59 | Admitting: Hematology and Oncology

## 2021-07-25 ENCOUNTER — Other Ambulatory Visit: Payer: 59

## 2021-07-25 DIAGNOSIS — M1990 Unspecified osteoarthritis, unspecified site: Secondary | ICD-10-CM | POA: Diagnosis not present

## 2021-07-25 DIAGNOSIS — C7951 Secondary malignant neoplasm of bone: Secondary | ICD-10-CM | POA: Diagnosis not present

## 2021-07-25 DIAGNOSIS — R0902 Hypoxemia: Secondary | ICD-10-CM | POA: Diagnosis not present

## 2021-07-25 DIAGNOSIS — Z888 Allergy status to other drugs, medicaments and biological substances status: Secondary | ICD-10-CM | POA: Diagnosis not present

## 2021-07-25 DIAGNOSIS — R262 Difficulty in walking, not elsewhere classified: Secondary | ICD-10-CM | POA: Diagnosis not present

## 2021-07-25 DIAGNOSIS — E871 Hypo-osmolality and hyponatremia: Secondary | ICD-10-CM | POA: Diagnosis not present

## 2021-07-25 DIAGNOSIS — Z9981 Dependence on supplemental oxygen: Secondary | ICD-10-CM | POA: Diagnosis not present

## 2021-07-25 DIAGNOSIS — Z85118 Personal history of other malignant neoplasm of bronchus and lung: Secondary | ICD-10-CM | POA: Diagnosis not present

## 2021-07-25 DIAGNOSIS — Z993 Dependence on wheelchair: Secondary | ICD-10-CM | POA: Diagnosis not present

## 2021-07-25 DIAGNOSIS — J439 Emphysema, unspecified: Secondary | ICD-10-CM | POA: Diagnosis not present

## 2021-07-25 DIAGNOSIS — J9611 Chronic respiratory failure with hypoxia: Secondary | ICD-10-CM | POA: Diagnosis not present

## 2021-07-25 DIAGNOSIS — C7952 Secondary malignant neoplasm of bone marrow: Secondary | ICD-10-CM | POA: Diagnosis not present

## 2021-07-25 DIAGNOSIS — I499 Cardiac arrhythmia, unspecified: Secondary | ICD-10-CM | POA: Diagnosis not present

## 2021-07-25 DIAGNOSIS — Z87891 Personal history of nicotine dependence: Secondary | ICD-10-CM | POA: Diagnosis not present

## 2021-07-25 DIAGNOSIS — R0602 Shortness of breath: Secondary | ICD-10-CM | POA: Diagnosis not present

## 2021-07-25 DIAGNOSIS — G2581 Restless legs syndrome: Secondary | ICD-10-CM | POA: Diagnosis not present

## 2021-07-25 DIAGNOSIS — I509 Heart failure, unspecified: Secondary | ICD-10-CM | POA: Diagnosis not present

## 2021-07-25 DIAGNOSIS — I110 Hypertensive heart disease with heart failure: Secondary | ICD-10-CM | POA: Diagnosis not present

## 2021-07-25 DIAGNOSIS — E039 Hypothyroidism, unspecified: Secondary | ICD-10-CM | POA: Diagnosis not present

## 2021-07-25 DIAGNOSIS — K219 Gastro-esophageal reflux disease without esophagitis: Secondary | ICD-10-CM | POA: Diagnosis not present

## 2021-07-25 DIAGNOSIS — R6889 Other general symptoms and signs: Secondary | ICD-10-CM | POA: Diagnosis not present

## 2021-07-25 DIAGNOSIS — C787 Secondary malignant neoplasm of liver and intrahepatic bile duct: Secondary | ICD-10-CM | POA: Diagnosis not present

## 2021-07-25 DIAGNOSIS — Z66 Do not resuscitate: Secondary | ICD-10-CM | POA: Diagnosis not present

## 2021-07-25 DIAGNOSIS — B952 Enterococcus as the cause of diseases classified elsewhere: Secondary | ICD-10-CM | POA: Diagnosis not present

## 2021-07-25 DIAGNOSIS — E032 Hypothyroidism due to medicaments and other exogenous substances: Secondary | ICD-10-CM | POA: Diagnosis not present

## 2021-07-25 DIAGNOSIS — G928 Other toxic encephalopathy: Secondary | ICD-10-CM | POA: Diagnosis not present

## 2021-07-25 DIAGNOSIS — E222 Syndrome of inappropriate secretion of antidiuretic hormone: Secondary | ICD-10-CM | POA: Diagnosis not present

## 2021-07-25 DIAGNOSIS — Z7901 Long term (current) use of anticoagulants: Secondary | ICD-10-CM | POA: Diagnosis not present

## 2021-07-25 DIAGNOSIS — Z79899 Other long term (current) drug therapy: Secondary | ICD-10-CM | POA: Diagnosis not present

## 2021-07-25 DIAGNOSIS — C3492 Malignant neoplasm of unspecified part of left bronchus or lung: Secondary | ICD-10-CM | POA: Diagnosis not present

## 2021-07-25 DIAGNOSIS — Z743 Need for continuous supervision: Secondary | ICD-10-CM | POA: Diagnosis not present

## 2021-07-25 DIAGNOSIS — I2782 Chronic pulmonary embolism: Secondary | ICD-10-CM | POA: Diagnosis not present

## 2021-07-25 DIAGNOSIS — J449 Chronic obstructive pulmonary disease, unspecified: Secondary | ICD-10-CM | POA: Diagnosis not present

## 2021-07-25 DIAGNOSIS — C3432 Malignant neoplasm of lower lobe, left bronchus or lung: Secondary | ICD-10-CM | POA: Diagnosis not present

## 2021-07-25 DIAGNOSIS — N390 Urinary tract infection, site not specified: Secondary | ICD-10-CM | POA: Diagnosis not present

## 2021-07-25 NOTE — Progress Notes (Signed)
Sent in request for DOS 05/08, 05/09, and 05/11 to [employer].  ?

## 2021-07-25 NOTE — Telephone Encounter (Signed)
Contacted pt to notify her of upcoming appts for CT/LAB, Follow-up, and infusion but was notified by patients daughter that pt was taken to [facility] ED.  ? ?Unable to confirm the upcoming appts. ? ?

## 2021-07-26 DIAGNOSIS — I2782 Chronic pulmonary embolism: Secondary | ICD-10-CM

## 2021-07-26 DIAGNOSIS — Z85118 Personal history of other malignant neoplasm of bronchus and lung: Secondary | ICD-10-CM

## 2021-07-26 DIAGNOSIS — E032 Hypothyroidism due to medicaments and other exogenous substances: Secondary | ICD-10-CM

## 2021-07-26 DIAGNOSIS — C7952 Secondary malignant neoplasm of bone marrow: Secondary | ICD-10-CM

## 2021-07-26 DIAGNOSIS — C3432 Malignant neoplasm of lower lobe, left bronchus or lung: Secondary | ICD-10-CM

## 2021-07-26 DIAGNOSIS — C7951 Secondary malignant neoplasm of bone: Secondary | ICD-10-CM

## 2021-07-27 ENCOUNTER — Ambulatory Visit: Payer: 59

## 2021-07-31 ENCOUNTER — Other Ambulatory Visit: Payer: Self-pay | Admitting: Pharmacist

## 2021-07-31 ENCOUNTER — Ambulatory Visit: Payer: 59 | Admitting: Hematology and Oncology

## 2021-08-02 ENCOUNTER — Ambulatory Visit: Payer: 59

## 2021-08-03 ENCOUNTER — Telehealth: Payer: Self-pay

## 2021-08-03 ENCOUNTER — Other Ambulatory Visit: Payer: Self-pay | Admitting: Hematology and Oncology

## 2021-08-03 DIAGNOSIS — R279 Unspecified lack of coordination: Secondary | ICD-10-CM | POA: Diagnosis not present

## 2021-08-03 DIAGNOSIS — Z7401 Bed confinement status: Secondary | ICD-10-CM | POA: Diagnosis not present

## 2021-08-03 MED ORDER — HYDROCODONE-ACETAMINOPHEN 5-325 MG PO TABS: 1.0000 | ORAL_TABLET | ORAL | 0 refills | Status: AC | PRN

## 2021-08-03 NOTE — Telephone Encounter (Signed)
Zachery Dauer, PA  ? ?I refilled hydrocodone 5/325 for 1-2 every 4hr prn. Thanks! ? ?Tiffany RN [facility] aware, she is also calling in hospice for the patient. ? ?

## 2021-08-10 ENCOUNTER — Other Ambulatory Visit: Payer: Self-pay

## 2021-08-14 ENCOUNTER — Telehealth: Payer: Self-pay

## 2021-08-14 NOTE — Telephone Encounter (Signed)
I sent scheduling message for pt to be seen next week w/labs & F/U   RE: Being discharged from [place] to home w/Hospice Received: Today Derwood Kaplan, MD  [name], NP; Dairl Ponder, RN; Ottie Glazier; Belva Chimes, LPN; Mort Sawyers Yes, next week sounds good, might as well check labs to see where her sodium is.    5/24 @ 1230 - I spoke with pt. She is happy to be at home. Pt states, "I slept so good. My daughter gave me a bath and changed my sheets. I don't even think I moved at all". Pt is willing to come in for appt's as you want, but Evelena Peat will need to schedule her a ride. She is living alone. Hospice CNA came in today and gave her bath. Hospice nurse going back out on Friday per pt. She asked, "do you know about the disease progressing? They say she doesn't think I have long. I don't know". I told her that Dr Hinton Rao would need to discuss that with her, not myself. She thanked me for calling to check on her.    5/24 @1227 - I spoke with [facility], triage nurse for [place]. Pt pain regimen is dexamethasone 2mg  po qd, oxycodone 5mg  po q 4hr PRN pain, & gabapentin 400mg  po BID. She also continues to take the sodium 1Gm tabs BID. No need for refills as of yet.   5/23-RE: Being discharged from [place] to home w/Hospice Received: Today Derwood Kaplan, MD  Dairl Ponder, RN; [name], NP; Emily Filbert, LPN Yes, I am glad to do so.  We can see her here as needed, not sure how mobile she is. We need to find out what pain meds she is on and whether they need me to send in Rx.  I assume we will not be checking labs to see how her sodium is, since not treating.  Will she be living alone?     5/23 - Call received from [facility], from [place]. Pt has been in [place] for uncontrolled pain. However, pt is now feeling better, pain is controlled and she is up and about. She is  going home today. They need another order for Dr Hinton Rao to to remain attending physician for home Hospice. [phone]  Above message sent to Dr Hinton Rao, Everlene Balls, Gabriel Rung, and [place] Evans,LPN @ [phone].

## 2021-08-15 ENCOUNTER — Encounter: Payer: Self-pay | Admitting: Oncology

## 2021-08-15 ENCOUNTER — Other Ambulatory Visit: Payer: 59

## 2021-08-15 ENCOUNTER — Ambulatory Visit: Payer: 59 | Admitting: Hematology and Oncology

## 2021-08-16 ENCOUNTER — Telehealth: Payer: Self-pay

## 2021-08-16 ENCOUNTER — Telehealth: Payer: Self-pay | Admitting: Oncology

## 2021-08-16 NOTE — Telephone Encounter (Signed)
Contacted pt to schedule an appt next week. Unable to reach via phone, voicemail was left.    Scheduling Message Entered by [place], AMY W on 08/15/2021 at  3:01 PM Priority: High EST PT 30  Department: [facility]  Provider: Derwood Kaplan, MD  Appointment Notes:  R/S appt from 08/15/21 to next week  Scheduling Notes:

## 2021-08-16 NOTE — Telephone Encounter (Addendum)
08/16/2021 Dr Hinton Rao:  not surprised, thought she would deteriorate quickly, sodium prob very low.  I will see her there this weekend.   08/16/2021  - Received call from York Cerise, Hospice nurse. Pt req to be moved back to [facility] for uncontrolled pain. Hospice nurse reports she is confused and unable to care for self. Dr Hinton Rao notified and in agreement.

## 2021-08-17 ENCOUNTER — Ambulatory Visit: Payer: 59

## 2021-08-17 ENCOUNTER — Telehealth: Payer: Self-pay | Admitting: Oncology

## 2021-08-17 ENCOUNTER — Encounter: Payer: Self-pay | Admitting: Oncology

## 2021-08-17 NOTE — Telephone Encounter (Signed)
Contacted pt to schedule an appt. Unable to reach via phone.    Scheduling Message Entered by [place], AMY W on 08/15/2021 at  3:01 PM Priority: High EST PT 30  Department: [facility]  Provider: Derwood Kaplan, MD  Appointment Notes:  R/S appt from 08/15/21 to next week  Scheduling Notes:

## 2021-08-24 ENCOUNTER — Telehealth: Payer: Self-pay | Admitting: Oncology

## 2021-08-24 NOTE — Telephone Encounter (Signed)
Contacted pt to schedule an appt. Unable to reach via phone, voicemail was left.   Scheduling Message Entered by [place], AMY W on 08/15/2021 at  3:01 PM Priority: High EST PT 30  Department: [facility]  Provider: Derwood Kaplan, MD  Appointment Notes:  R/S appt from 08/15/21 to next week  Scheduling Notes:

## 2021-09-22 DEATH — deceased

## 2022-07-02 NOTE — Telephone Encounter (Signed)
Encounter opened in error

## 2022-07-12 NOTE — Telephone Encounter (Signed)
Done

## 2022-07-18 NOTE — Telephone Encounter (Signed)
Closed
